# Patient Record
Sex: Female | Born: 1935
Health system: Southern US, Community
[De-identification: ages and names within clinical notes are randomized; demographics above are authoritative.]

## PROBLEM LIST (undated history)

## (undated) DIAGNOSIS — E039 Hypothyroidism, unspecified: Secondary | ICD-10-CM

## (undated) DIAGNOSIS — R413 Other amnesia: Secondary | ICD-10-CM

## (undated) DIAGNOSIS — I10 Essential (primary) hypertension: Secondary | ICD-10-CM

## (undated) DIAGNOSIS — H8109 Meniere's disease, unspecified ear: Secondary | ICD-10-CM

## (undated) DIAGNOSIS — E785 Hyperlipidemia, unspecified: Secondary | ICD-10-CM

## (undated) DIAGNOSIS — Z932 Ileostomy status: Secondary | ICD-10-CM

## (undated) DIAGNOSIS — K5190 Ulcerative colitis, unspecified, without complications: Secondary | ICD-10-CM

## (undated) DIAGNOSIS — I4819 Other persistent atrial fibrillation: Secondary | ICD-10-CM

## (undated) HISTORY — PX: COLECTOMY: SHX59

## (undated) HISTORY — DX: Meniere's disease, unspecified ear: H81.09

## (undated) HISTORY — PX: ILEOSTOMY: SHX1783

## (undated) HISTORY — DX: Essential (primary) hypertension: I10

## (undated) HISTORY — DX: Hyperlipidemia, unspecified: E78.5

## (undated) HISTORY — PX: OTHER SURGICAL HISTORY: SHX169

## (undated) HISTORY — PX: COLONOSCOPY: SHX174

## (undated) HISTORY — DX: Ulcerative colitis, unspecified, without complications: K51.90

## (undated) HISTORY — PX: PROCTECTOMY: SHX315

## (undated) HISTORY — DX: Ileostomy status: Z93.2

---

## 1994-08-15 DIAGNOSIS — H8109 Meniere's disease, unspecified ear: Secondary | ICD-10-CM

## 1994-08-15 HISTORY — DX: Meniere's disease, unspecified ear: H81.09

## 1998-05-19 ENCOUNTER — Inpatient Hospital Stay (HOSPITAL_COMMUNITY): Admission: EM | Admit: 1998-05-19 | Discharge: 1998-05-23 | Payer: Self-pay | Admitting: Gastroenterology

## 1998-05-19 ENCOUNTER — Encounter: Payer: Self-pay | Admitting: Gastroenterology

## 1998-05-20 ENCOUNTER — Encounter: Payer: Self-pay | Admitting: Gastroenterology

## 1998-06-08 LAB — HM COLONOSCOPY

## 1998-07-21 ENCOUNTER — Inpatient Hospital Stay (HOSPITAL_COMMUNITY): Admission: RE | Admit: 1998-07-21 | Discharge: 1998-07-30 | Payer: Self-pay | Admitting: Surgery

## 1998-12-10 ENCOUNTER — Other Ambulatory Visit: Admission: RE | Admit: 1998-12-10 | Discharge: 1998-12-10 | Payer: Self-pay | Admitting: Surgery

## 1999-04-13 ENCOUNTER — Other Ambulatory Visit: Admission: RE | Admit: 1999-04-13 | Discharge: 1999-04-13 | Payer: Self-pay | Admitting: Internal Medicine

## 1999-05-20 ENCOUNTER — Ambulatory Visit (HOSPITAL_COMMUNITY): Admission: RE | Admit: 1999-05-20 | Discharge: 1999-05-20 | Payer: Self-pay | Admitting: Endocrinology

## 1999-05-20 ENCOUNTER — Encounter: Payer: Self-pay | Admitting: Endocrinology

## 2000-06-06 ENCOUNTER — Other Ambulatory Visit: Admission: RE | Admit: 2000-06-06 | Discharge: 2000-06-06 | Payer: Self-pay | Admitting: Internal Medicine

## 2000-06-14 ENCOUNTER — Encounter: Admission: RE | Admit: 2000-06-14 | Discharge: 2000-06-14 | Payer: Self-pay | Admitting: Internal Medicine

## 2000-06-14 ENCOUNTER — Encounter: Payer: Self-pay | Admitting: Internal Medicine

## 2001-01-11 ENCOUNTER — Ambulatory Visit (HOSPITAL_COMMUNITY): Admission: RE | Admit: 2001-01-11 | Discharge: 2001-01-11 | Payer: Self-pay | Admitting: Endocrinology

## 2001-01-11 ENCOUNTER — Encounter: Payer: Self-pay | Admitting: Endocrinology

## 2001-01-17 ENCOUNTER — Ambulatory Visit (HOSPITAL_COMMUNITY): Admission: RE | Admit: 2001-01-17 | Discharge: 2001-01-17 | Payer: Self-pay | Admitting: Endocrinology

## 2001-01-17 ENCOUNTER — Encounter: Payer: Self-pay | Admitting: Endocrinology

## 2001-11-06 ENCOUNTER — Encounter: Admission: RE | Admit: 2001-11-06 | Discharge: 2001-11-06 | Payer: Self-pay | Admitting: Internal Medicine

## 2001-11-06 ENCOUNTER — Encounter: Payer: Self-pay | Admitting: Internal Medicine

## 2001-12-26 ENCOUNTER — Other Ambulatory Visit: Admission: RE | Admit: 2001-12-26 | Discharge: 2001-12-26 | Payer: Self-pay | Admitting: Internal Medicine

## 2003-06-04 ENCOUNTER — Encounter: Payer: Self-pay | Admitting: Internal Medicine

## 2003-06-04 ENCOUNTER — Encounter: Admission: RE | Admit: 2003-06-04 | Discharge: 2003-06-04 | Payer: Self-pay | Admitting: Internal Medicine

## 2004-06-08 ENCOUNTER — Encounter: Admission: RE | Admit: 2004-06-08 | Discharge: 2004-06-08 | Payer: Self-pay | Admitting: Internal Medicine

## 2004-09-21 ENCOUNTER — Ambulatory Visit: Payer: Self-pay | Admitting: Internal Medicine

## 2004-09-28 ENCOUNTER — Ambulatory Visit: Payer: Self-pay | Admitting: Internal Medicine

## 2005-03-22 ENCOUNTER — Ambulatory Visit: Payer: Self-pay | Admitting: Internal Medicine

## 2005-04-06 ENCOUNTER — Ambulatory Visit: Payer: Self-pay | Admitting: Internal Medicine

## 2005-04-13 ENCOUNTER — Ambulatory Visit: Payer: Self-pay | Admitting: Internal Medicine

## 2005-06-14 ENCOUNTER — Encounter: Admission: RE | Admit: 2005-06-14 | Discharge: 2005-06-14 | Payer: Self-pay | Admitting: Internal Medicine

## 2005-10-25 ENCOUNTER — Ambulatory Visit: Payer: Self-pay | Admitting: Internal Medicine

## 2005-11-10 ENCOUNTER — Ambulatory Visit: Payer: Self-pay | Admitting: Internal Medicine

## 2006-02-22 ENCOUNTER — Ambulatory Visit: Payer: Self-pay | Admitting: Internal Medicine

## 2006-05-10 ENCOUNTER — Ambulatory Visit: Payer: Self-pay | Admitting: Internal Medicine

## 2006-05-17 ENCOUNTER — Ambulatory Visit: Payer: Self-pay | Admitting: Internal Medicine

## 2006-06-06 ENCOUNTER — Ambulatory Visit: Payer: Self-pay | Admitting: Internal Medicine

## 2006-06-19 ENCOUNTER — Ambulatory Visit: Payer: Self-pay | Admitting: Internal Medicine

## 2006-07-11 ENCOUNTER — Encounter: Admission: RE | Admit: 2006-07-11 | Discharge: 2006-07-11 | Payer: Self-pay | Admitting: Internal Medicine

## 2006-11-14 ENCOUNTER — Ambulatory Visit: Payer: Self-pay | Admitting: Internal Medicine

## 2006-11-14 LAB — CONVERTED CEMR LAB
ALT: 20 units/L (ref 0–40)
AST: 30 units/L (ref 0–37)
Basophils Absolute: 0 10*3/uL (ref 0.0–0.1)
Basophils Relative: 0.3 % (ref 0.0–1.0)
Eosinophils Absolute: 0.1 10*3/uL (ref 0.0–0.6)
Eosinophils Relative: 3 % (ref 0.0–5.0)
Glucose, Bld: 96 mg/dL (ref 70–99)
HCT: 40.3 % (ref 36.0–46.0)
Hemoglobin: 13.5 g/dL (ref 12.0–15.0)
Hgb A1c MFr Bld: 6.1 % — ABNORMAL HIGH (ref 4.6–6.0)
Lymphocytes Relative: 23.5 % (ref 12.0–46.0)
MCHC: 33.4 g/dL (ref 30.0–36.0)
MCV: 87.5 fL (ref 78.0–100.0)
Monocytes Absolute: 0.6 10*3/uL (ref 0.2–0.7)
Monocytes Relative: 13.2 % — ABNORMAL HIGH (ref 3.0–11.0)
Neutro Abs: 2.9 10*3/uL (ref 1.4–7.7)
Neutrophils Relative %: 60 % (ref 43.0–77.0)
Platelets: 286 10*3/uL (ref 150–400)
RBC: 4.61 M/uL (ref 3.87–5.11)
RDW: 13.5 % (ref 11.5–14.6)
TSH: 1.67 microintl units/mL (ref 0.35–5.50)
WBC: 4.7 10*3/uL (ref 4.5–10.5)

## 2006-11-21 ENCOUNTER — Ambulatory Visit: Payer: Self-pay | Admitting: Internal Medicine

## 2007-03-19 ENCOUNTER — Encounter: Payer: Self-pay | Admitting: Internal Medicine

## 2007-03-19 DIAGNOSIS — M810 Age-related osteoporosis without current pathological fracture: Secondary | ICD-10-CM | POA: Insufficient documentation

## 2007-03-19 DIAGNOSIS — E785 Hyperlipidemia, unspecified: Secondary | ICD-10-CM | POA: Insufficient documentation

## 2007-03-19 DIAGNOSIS — I10 Essential (primary) hypertension: Secondary | ICD-10-CM | POA: Insufficient documentation

## 2007-03-19 DIAGNOSIS — K5190 Ulcerative colitis, unspecified, without complications: Secondary | ICD-10-CM | POA: Insufficient documentation

## 2007-03-19 DIAGNOSIS — H8109 Meniere's disease, unspecified ear: Secondary | ICD-10-CM | POA: Insufficient documentation

## 2007-06-06 ENCOUNTER — Ambulatory Visit: Payer: Self-pay | Admitting: Internal Medicine

## 2007-06-06 LAB — CONVERTED CEMR LAB
ALT: 18 units/L (ref 0–35)
AST: 29 units/L (ref 0–37)
Albumin: 3.6 g/dL (ref 3.5–5.2)
Alkaline Phosphatase: 55 units/L (ref 39–117)
BUN: 15 mg/dL (ref 6–23)
Basophils Absolute: 0 10*3/uL (ref 0.0–0.1)
Basophils Relative: 0.4 % (ref 0.0–1.0)
Bilirubin, Direct: 0.1 mg/dL (ref 0.0–0.3)
CO2: 30 meq/L (ref 19–32)
Calcium: 9.5 mg/dL (ref 8.4–10.5)
Chloride: 105 meq/L (ref 96–112)
Cholesterol: 180 mg/dL (ref 0–200)
Creatinine, Ser: 0.8 mg/dL (ref 0.4–1.2)
Eosinophils Absolute: 0.1 10*3/uL (ref 0.0–0.6)
Eosinophils Relative: 1.9 % (ref 0.0–5.0)
GFR calc Af Amer: 91 mL/min
GFR calc non Af Amer: 75 mL/min
Glucose, Bld: 109 mg/dL — ABNORMAL HIGH (ref 70–99)
HCT: 39.8 % (ref 36.0–46.0)
HDL: 66.5 mg/dL (ref >39.0)
Hemoglobin: 13.5 g/dL (ref 12.0–15.0)
Hgb A1c MFr Bld: 6.2 % — ABNORMAL HIGH (ref 4.6–6.0)
LDL Cholesterol: 95 mg/dL (ref 0–99)
Lymphocytes Relative: 22.6 % (ref 12.0–46.0)
MCHC: 33.9 g/dL (ref 30.0–36.0)
MCV: 86.5 fL (ref 78.0–100.0)
Monocytes Absolute: 0.6 10*3/uL (ref 0.2–0.7)
Monocytes Relative: 12.1 % — ABNORMAL HIGH (ref 3.0–11.0)
Neutro Abs: 3.2 10*3/uL (ref 1.4–7.7)
Neutrophils Relative %: 63 % (ref 43.0–77.0)
Platelets: 284 10*3/uL (ref 150–400)
Potassium: 4.8 meq/L (ref 3.5–5.1)
RBC: 4.6 M/uL (ref 3.87–5.11)
RDW: 14.2 % (ref 11.5–14.6)
Sodium: 141 meq/L (ref 135–145)
TSH: 1.1 microintl units/mL (ref 0.35–5.50)
Total Bilirubin: 0.6 mg/dL (ref 0.3–1.2)
Total CHOL/HDL Ratio: 2.7
Total Protein: 6.5 g/dL (ref 6.0–8.3)
Triglycerides: 92 mg/dL (ref 0–149)
VLDL: 18 mg/dL (ref 0–40)
Vit D, 1,25-Dihydroxy: 38 (ref 30–89)
Vitamin B-12: 637 pg/mL (ref 211–911)
WBC: 5 10*3/uL (ref 4.5–10.5)

## 2007-06-12 ENCOUNTER — Encounter: Payer: Self-pay | Admitting: Internal Medicine

## 2007-06-12 ENCOUNTER — Ambulatory Visit: Payer: Self-pay | Admitting: Internal Medicine

## 2007-06-12 DIAGNOSIS — R21 Rash and other nonspecific skin eruption: Secondary | ICD-10-CM | POA: Insufficient documentation

## 2007-07-17 ENCOUNTER — Encounter: Admission: RE | Admit: 2007-07-17 | Discharge: 2007-07-17 | Payer: Self-pay | Admitting: Internal Medicine

## 2007-07-26 ENCOUNTER — Encounter: Payer: Self-pay | Admitting: Internal Medicine

## 2007-07-26 ENCOUNTER — Telehealth: Payer: Self-pay | Admitting: Internal Medicine

## 2007-09-26 ENCOUNTER — Ambulatory Visit: Payer: Self-pay | Admitting: Internal Medicine

## 2007-09-26 DIAGNOSIS — H6120 Impacted cerumen, unspecified ear: Secondary | ICD-10-CM | POA: Insufficient documentation

## 2007-12-17 ENCOUNTER — Telehealth: Payer: Self-pay | Admitting: Internal Medicine

## 2008-01-11 ENCOUNTER — Encounter: Payer: Self-pay | Admitting: Internal Medicine

## 2008-03-19 ENCOUNTER — Ambulatory Visit: Payer: Self-pay | Admitting: Internal Medicine

## 2008-03-19 LAB — CONVERTED CEMR LAB
ALT: 21 units/L (ref 0–35)
AST: 33 units/L (ref 0–37)
Albumin: 3.7 g/dL (ref 3.5–5.2)
Alkaline Phosphatase: 59 units/L (ref 39–117)
BUN: 10 mg/dL (ref 6–23)
Basophils Absolute: 0 10*3/uL (ref 0.0–0.1)
Basophils Relative: 0.2 % (ref 0.0–3.0)
Bilirubin Urine: NEGATIVE
Bilirubin, Direct: 0.1 mg/dL (ref 0.0–0.3)
CO2: 31 meq/L (ref 19–32)
Calcium: 9.5 mg/dL (ref 8.4–10.5)
Chloride: 103 meq/L (ref 96–112)
Cholesterol: 177 mg/dL (ref 0–200)
Creatinine, Ser: 0.8 mg/dL (ref 0.4–1.2)
Eosinophils Absolute: 0.1 10*3/uL (ref 0.0–0.7)
Eosinophils Relative: 1.8 % (ref 0.0–5.0)
GFR calc Af Amer: 91 mL/min
GFR calc non Af Amer: 75 mL/min
Glucose, Bld: 103 mg/dL — ABNORMAL HIGH (ref 70–99)
HCT: 40.4 % (ref 36.0–46.0)
HDL: 72.2 mg/dL (ref >39.0)
Hemoglobin, Urine: NEGATIVE
Hemoglobin: 13.8 g/dL (ref 12.0–15.0)
Ketones, ur: NEGATIVE mg/dL
LDL Cholesterol: 96 mg/dL (ref 0–99)
Leukocytes, UA: NEGATIVE
Lymphocytes Relative: 23.6 % (ref 12.0–46.0)
MCHC: 34.1 g/dL (ref 30.0–36.0)
MCV: 88.7 fL (ref 78.0–100.0)
Monocytes Absolute: 0.6 10*3/uL (ref 0.1–1.0)
Monocytes Relative: 12.9 % — ABNORMAL HIGH (ref 3.0–12.0)
Neutro Abs: 2.9 10*3/uL (ref 1.4–7.7)
Neutrophils Relative %: 61.5 % (ref 43.0–77.0)
Nitrite: NEGATIVE
Platelets: 242 10*3/uL (ref 150–400)
Potassium: 4.1 meq/L (ref 3.5–5.1)
RBC: 4.55 M/uL (ref 3.87–5.11)
RDW: 13.7 % (ref 11.5–14.6)
Sodium: 140 meq/L (ref 135–145)
Specific Gravity, Urine: 1.01 (ref 1.000–1.03)
TSH: 0.49 microintl units/mL (ref 0.35–5.50)
Total Bilirubin: 0.9 mg/dL (ref 0.3–1.2)
Total CHOL/HDL Ratio: 2.5
Total Protein, Urine: NEGATIVE mg/dL
Total Protein: 6.3 g/dL (ref 6.0–8.3)
Triglycerides: 43 mg/dL (ref 0–149)
Urine Glucose: NEGATIVE mg/dL
Urobilinogen, UA: 0.2 (ref 0.0–1.0)
VLDL: 9 mg/dL (ref 0–40)
Vitamin B-12: 641 pg/mL (ref 211–911)
WBC: 4.7 10*3/uL (ref 4.5–10.5)
pH: 7 (ref 5.0–8.0)

## 2008-03-25 ENCOUNTER — Ambulatory Visit: Payer: Self-pay | Admitting: Internal Medicine

## 2008-07-23 ENCOUNTER — Encounter: Admission: RE | Admit: 2008-07-23 | Discharge: 2008-07-23 | Payer: Self-pay | Admitting: Internal Medicine

## 2008-09-17 ENCOUNTER — Ambulatory Visit: Payer: Self-pay | Admitting: Internal Medicine

## 2008-09-17 LAB — CONVERTED CEMR LAB
BUN: 10 mg/dL (ref 6–23)
CO2: 32 meq/L (ref 19–32)
Calcium: 9.4 mg/dL (ref 8.4–10.5)
Chloride: 106 meq/L (ref 96–112)
Creatinine, Ser: 0.7 mg/dL (ref 0.4–1.2)
GFR calc Af Amer: 106 mL/min
GFR calc non Af Amer: 87 mL/min
Glucose, Bld: 93 mg/dL (ref 70–99)
Potassium: 4.5 meq/L (ref 3.5–5.1)
Sodium: 142 meq/L (ref 135–145)

## 2008-09-24 ENCOUNTER — Ambulatory Visit: Payer: Self-pay | Admitting: Internal Medicine

## 2009-01-26 ENCOUNTER — Encounter: Payer: Self-pay | Admitting: Internal Medicine

## 2009-04-07 ENCOUNTER — Ambulatory Visit: Payer: Self-pay | Admitting: Internal Medicine

## 2009-04-07 LAB — CONVERTED CEMR LAB
ALT: 20 units/L (ref 0–35)
AST: 29 units/L (ref 0–37)
Albumin: 3.7 g/dL (ref 3.5–5.2)
Alkaline Phosphatase: 54 units/L (ref 39–117)
BUN: 10 mg/dL (ref 6–23)
Basophils Absolute: 0 10*3/uL (ref 0.0–0.1)
Basophils Relative: 0.1 % (ref 0.0–3.0)
Bilirubin Urine: NEGATIVE
Bilirubin, Direct: 0.1 mg/dL (ref 0.0–0.3)
CO2: 30 meq/L (ref 19–32)
Calcium: 9.2 mg/dL (ref 8.4–10.5)
Chloride: 107 meq/L (ref 96–112)
Cholesterol: 178 mg/dL (ref 0–200)
Creatinine, Ser: 0.7 mg/dL (ref 0.4–1.2)
Eosinophils Absolute: 0.1 10*3/uL (ref 0.0–0.7)
Eosinophils Relative: 2.1 % (ref 0.0–5.0)
GFR calc non Af Amer: 87.21 mL/min (ref >60)
Glucose, Bld: 105 mg/dL — ABNORMAL HIGH (ref 70–99)
HCT: 40.6 % (ref 36.0–46.0)
HDL: 72.1 mg/dL (ref >39.00)
Hemoglobin, Urine: NEGATIVE
Hemoglobin: 13.8 g/dL (ref 12.0–15.0)
Ketones, ur: NEGATIVE mg/dL
LDL Cholesterol: 94 mg/dL (ref 0–99)
Leukocytes, UA: NEGATIVE
Lymphocytes Relative: 18.1 % (ref 12.0–46.0)
Lymphs Abs: 1.1 10*3/uL (ref 0.7–4.0)
MCHC: 33.8 g/dL (ref 30.0–36.0)
MCV: 93.1 fL (ref 78.0–100.0)
Monocytes Absolute: 0.6 10*3/uL (ref 0.1–1.0)
Monocytes Relative: 9.7 % (ref 3.0–12.0)
Neutro Abs: 4.2 10*3/uL (ref 1.4–7.7)
Neutrophils Relative %: 70 % (ref 43.0–77.0)
Nitrite: NEGATIVE
Platelets: 249 10*3/uL (ref 150.0–400.0)
Potassium: 4.4 meq/L (ref 3.5–5.1)
RBC: 4.37 M/uL (ref 3.87–5.11)
RDW: 14.4 % (ref 11.5–14.6)
Sodium: 141 meq/L (ref 135–145)
Specific Gravity, Urine: 1.015 (ref 1.000–1.030)
TSH: 0.93 microintl units/mL (ref 0.35–5.50)
Total Bilirubin: 0.8 mg/dL (ref 0.3–1.2)
Total CHOL/HDL Ratio: 2
Total Protein, Urine: NEGATIVE mg/dL
Total Protein: 6.3 g/dL (ref 6.0–8.3)
Triglycerides: 59 mg/dL (ref 0.0–149.0)
Urine Glucose: NEGATIVE mg/dL
Urobilinogen, UA: 0.2 (ref 0.0–1.0)
VLDL: 11.8 mg/dL (ref 0.0–40.0)
WBC: 6 10*3/uL (ref 4.5–10.5)
pH: 7.5 (ref 5.0–8.0)

## 2009-04-14 ENCOUNTER — Ambulatory Visit: Payer: Self-pay | Admitting: Internal Medicine

## 2009-04-14 DIAGNOSIS — D485 Neoplasm of uncertain behavior of skin: Secondary | ICD-10-CM | POA: Insufficient documentation

## 2009-05-06 ENCOUNTER — Encounter: Payer: Self-pay | Admitting: Internal Medicine

## 2009-05-06 ENCOUNTER — Ambulatory Visit: Payer: Self-pay | Admitting: Internal Medicine

## 2009-07-14 ENCOUNTER — Encounter: Payer: Self-pay | Admitting: Internal Medicine

## 2009-09-03 ENCOUNTER — Encounter: Admission: RE | Admit: 2009-09-03 | Discharge: 2009-09-03 | Payer: Self-pay | Admitting: Internal Medicine

## 2009-09-03 LAB — HM MAMMOGRAPHY: HM Mammogram: NEGATIVE

## 2009-10-07 ENCOUNTER — Ambulatory Visit: Payer: Self-pay | Admitting: Internal Medicine

## 2009-10-07 LAB — CONVERTED CEMR LAB
ALT: 24 units/L (ref 0–35)
AST: 32 units/L (ref 0–37)
Albumin: 3.2 g/dL — ABNORMAL LOW (ref 3.5–5.2)
Alkaline Phosphatase: 50 units/L (ref 39–117)
BUN: 8 mg/dL (ref 6–23)
Basophils Absolute: 0 10*3/uL (ref 0.0–0.1)
Basophils Relative: 0.1 % (ref 0.0–3.0)
Bilirubin, Direct: 0.1 mg/dL (ref 0.0–0.3)
CO2: 29 meq/L (ref 19–32)
Calcium: 8.9 mg/dL (ref 8.4–10.5)
Chloride: 106 meq/L (ref 96–112)
Cholesterol: 155 mg/dL (ref 0–200)
Creatinine, Ser: 0.6 mg/dL (ref 0.4–1.2)
Eosinophils Absolute: 0.1 10*3/uL (ref 0.0–0.7)
Eosinophils Relative: 1.6 % (ref 0.0–5.0)
GFR calc non Af Amer: 104.05 mL/min (ref >60)
Glucose, Bld: 86 mg/dL (ref 70–99)
HCT: 39.5 % (ref 36.0–46.0)
HDL: 69.3 mg/dL (ref >39.00)
Hemoglobin: 12.9 g/dL (ref 12.0–15.0)
LDL Cholesterol: 76 mg/dL (ref 0–99)
Lymphocytes Relative: 22.9 % (ref 12.0–46.0)
Lymphs Abs: 1.1 10*3/uL (ref 0.7–4.0)
MCHC: 32.6 g/dL (ref 30.0–36.0)
MCV: 90.9 fL (ref 78.0–100.0)
Monocytes Absolute: 0.7 10*3/uL (ref 0.1–1.0)
Monocytes Relative: 15.3 % — ABNORMAL HIGH (ref 3.0–12.0)
Neutro Abs: 2.8 10*3/uL (ref 1.4–7.7)
Neutrophils Relative %: 60.1 % (ref 43.0–77.0)
Platelets: 219 10*3/uL (ref 150.0–400.0)
Potassium: 4.1 meq/L (ref 3.5–5.1)
RBC: 4.34 M/uL (ref 3.87–5.11)
RDW: 14.2 % (ref 11.5–14.6)
Sodium: 140 meq/L (ref 135–145)
TSH: 2.56 microintl units/mL (ref 0.35–5.50)
Total Bilirubin: 0.2 mg/dL — ABNORMAL LOW (ref 0.3–1.2)
Total CHOL/HDL Ratio: 2
Total Protein: 5.9 g/dL — ABNORMAL LOW (ref 6.0–8.3)
Triglycerides: 50 mg/dL (ref 0.0–149.0)
VLDL: 10 mg/dL (ref 0.0–40.0)
Vitamin B-12: 399 pg/mL (ref 211–911)
WBC: 4.7 10*3/uL (ref 4.5–10.5)

## 2009-10-14 ENCOUNTER — Ambulatory Visit: Payer: Self-pay | Admitting: Internal Medicine

## 2009-12-01 ENCOUNTER — Ambulatory Visit: Payer: Self-pay | Admitting: Internal Medicine

## 2010-01-12 ENCOUNTER — Encounter: Payer: Self-pay | Admitting: Internal Medicine

## 2010-01-20 ENCOUNTER — Ambulatory Visit: Payer: Self-pay | Admitting: Internal Medicine

## 2010-04-28 ENCOUNTER — Ambulatory Visit: Payer: Self-pay | Admitting: Internal Medicine

## 2010-04-28 LAB — CONVERTED CEMR LAB
ALT: 21 units/L (ref 0–35)
AST: 33 units/L (ref 0–37)
Albumin: 3.8 g/dL (ref 3.5–5.2)
Alkaline Phosphatase: 59 units/L (ref 39–117)
BUN: 16 mg/dL (ref 6–23)
Basophils Absolute: 0 10*3/uL (ref 0.0–0.1)
Basophils Relative: 0.6 % (ref 0.0–3.0)
Bilirubin, Direct: 0.2 mg/dL (ref 0.0–0.3)
CO2: 31 meq/L (ref 19–32)
Calcium: 9.3 mg/dL (ref 8.4–10.5)
Chloride: 104 meq/L (ref 96–112)
Creatinine, Ser: 0.7 mg/dL (ref 0.4–1.2)
Eosinophils Absolute: 0.1 10*3/uL (ref 0.0–0.7)
Eosinophils Relative: 2.7 % (ref 0.0–5.0)
GFR calc non Af Amer: 84.18 mL/min (ref >60)
Glucose, Bld: 77 mg/dL (ref 70–99)
HCT: 42.5 % (ref 36.0–46.0)
Hemoglobin: 14.6 g/dL (ref 12.0–15.0)
Lymphocytes Relative: 25.1 % (ref 12.0–46.0)
Lymphs Abs: 1.4 10*3/uL (ref 0.7–4.0)
MCHC: 34.3 g/dL (ref 30.0–36.0)
MCV: 92 fL (ref 78.0–100.0)
Monocytes Absolute: 0.6 10*3/uL (ref 0.1–1.0)
Monocytes Relative: 10.8 % (ref 3.0–12.0)
Neutro Abs: 3.4 10*3/uL (ref 1.4–7.7)
Neutrophils Relative %: 60.8 % (ref 43.0–77.0)
Platelets: 233 10*3/uL (ref 150.0–400.0)
Potassium: 4.4 meq/L (ref 3.5–5.1)
RBC: 4.63 M/uL (ref 3.87–5.11)
RDW: 14.5 % (ref 11.5–14.6)
Sodium: 141 meq/L (ref 135–145)
TSH: 1.03 microintl units/mL (ref 0.35–5.50)
Total Bilirubin: 0.7 mg/dL (ref 0.3–1.2)
Total Protein: 6.2 g/dL (ref 6.0–8.3)
WBC: 5.5 10*3/uL (ref 4.5–10.5)

## 2010-05-04 ENCOUNTER — Ambulatory Visit: Payer: Self-pay | Admitting: Internal Medicine

## 2010-05-04 DIAGNOSIS — L570 Actinic keratosis: Secondary | ICD-10-CM | POA: Insufficient documentation

## 2010-07-15 ENCOUNTER — Encounter: Payer: Self-pay | Admitting: Internal Medicine

## 2010-09-09 ENCOUNTER — Other Ambulatory Visit: Payer: Self-pay | Admitting: Internal Medicine

## 2010-09-09 DIAGNOSIS — Z1239 Encounter for other screening for malignant neoplasm of breast: Secondary | ICD-10-CM

## 2010-09-14 NOTE — Letter (Signed)
Summary: Otolaryngology/[facility]  Otolaryngology/[facility]   Imported By: Sherian Rein 02/17/2010 11:16:28  _____________________________________________________________________  External Attachment:    Type:   Image     Comment:   External Document

## 2010-09-14 NOTE — Assessment & Plan Note (Signed)
Summary: 6 MOS F/U # CD   Vital Signs:  Patient profile:   77 year old female Height:      67 inches Weight:      147 pounds BMI:     23.11 Temp:     97.6 degrees F oral Pulse rate:   76 / minute Pulse rhythm:   regular Resp:     16 per minute BP sitting:   152 / 84  (left arm) Cuff size:   regular  Vitals Entered By: Lanier Prude, CMA(AAMA) (May 04, 2010 10:59 AM) CC: 6 mo f/u Is Patient Diabetic? No Comments pt is not taking Lipo Flavonoid or Vit D.     CC:  6 mo f/u.  History of Present Illness: C/o AK on R nose F/u HTN, colitis  Preventive Screening-Counseling & Management  Alcohol-Tobacco     Smoking Status: never  Allergies: No Known Drug Allergies  Past History:  Past Medical History: Last updated: 04/14/2009 Hypertension (BP nl at home) Osteoporosis Ileostomy Hyperlipidemia H/o ulcerative colitis Menier's 1996  Family History: Last updated: 06/12/2007 Family History of CAD Female 1st degree relative <60 Family History of CAD Female 1st degree relative <50 Family History Hypertension  Social History: Last updated: 06/12/2007 Retired Married Never Smoked Alcohol use-no  Review of Systems  The patient denies weight loss, chest pain, prolonged cough, and abdominal pain.    Physical Exam  General:  Well-developed,well-nourished,in no acute distress; alert,appropriate and cooperative throughout examination Nose:  see skin exam Mouth:  WNL Lungs:  Normal respiratory effort, chest expands symmetrically. Lungs are clear to auscultation, no crackles or wheezes. Heart:  Normal rate and regular rhythm. S1 and S2 normal without gallop, murmur, click, rub or other extra sounds. Abdomen:  Bowel sounds positive,abdomen soft and non-tender . Stoma w/bag present. Msk:  No deformity or scoliosis noted of thoracic or lumbar spine.   Neurologic:  No cranial nerve deficits noted. Station and gait are normal. Plantar reflexes are down-going bilaterally.  DTRs are symmetrical throughout. Sensory, motor and coordinative functions appear intact. Skin:  2x2 mm R nose ala growth L nose lesion has disappeared - no scar Psych:  Cognition and judgment appear intact. Alert and cooperative with normal attention span and concentration. No apparent delusions, illusions, hallucinations   Impression & Recommendations:  Problem # 1:  ULCERATIVE COLITIS (ICD-556.9) Assessment Comment Only  Problem # 2:  ACTINIC KERATOSIS (ICD-702.0) R nose Assessment: New Procedure: cryo Indication: AK(s) Risks incl. scar(s), incomplete removal, ect.  and benefits discussed   1   lesion(s) on R nose was/were treated with liqid N2 in usual fasion.  Tolerated well. Compl. none. Wound care instructions given. Will biopsy if reoccurs.  Problem # 3:  HYPERLIPIDEMIA (ICD-272.4) Assessment: Unchanged  Her updated medication list for this problem includes:    Vytorin 10-20 Mg Tabs (Ezetimibe-simvastatin) ..... Once daily  Problem # 4:  HYPERTENSION (ICD-401.9) Assessment: Unchanged  Her updated medication list for this problem includes:    Aceon 8 Mg Tabs (Perindopril erbumine) .[name]... 1 once daily    Verapamil Hcl 80 Mg Tabs (Verapamil hcl) .[name]... 1 by mouth bid  BP today: 152/84 Prior BP: 144/72 (01/20/2010)  Labs Reviewed: K+: 4.4 (04/28/2010) Creat: : 0.7 (04/28/2010)   Chol: 155 (10/07/2009)   HDL: 69.30 (10/07/2009)   LDL: 76 (10/07/2009)   TG: 50.0 (10/07/2009)  Complete Medication List: 1)  Synthroid 75 Mcg Tabs (Levothyroxine sodium) .[name].. 1 once daily 2)  Vytorin 10-20 Mg Tabs (Ezetimibe-simvastatin) .... Once  daily 3)  Aceon 8 Mg Tabs (Perindopril erbumine) .[name].. 1 once daily 4)  Verapamil Hcl 80 Mg Tabs (Verapamil hcl) .[name].. 1 by mouth bid 5)  Amitriptyline Hcl 25 Mg Tabs (Amitriptyline hcl) .[name].. 1 by mouth qd 6)  Lipo-flavonoid Plus Tabs (Vitamins-lipotropics) .... Once daily 7)  Vitamin D3 1000 Unit Tabs (Cholecalciferol) .[name].. 1 qd 8)  Caltrate 600+d  600-400 Mg-unit Tabs (Calcium carbonate-vitamin d) .... Two times a day 9)  Centrum Silver Tabs (Multiple vitamins-minerals) .... Once daily 10)  Triamcinolone Acetonide 0.5 % Crea (Triamcinolone acetonide) .... Use two times a day prn  Other Orders: Flu Vaccine 1yrs + MEDICARE PATIENTS (Z6109) Administration Flu vaccine - MCR (G0008) Cryotherapy/Destruction benign or premalignant lesion (1st lesion)  (17000)  Patient Instructions: 1)  Please schedule a follow-up appointment in 6 months well w/labs.  Marland Kitchenlbmedflu   Flu Vaccine Consent Questions     Do you have a history of severe allergic reactions to this vaccine? no    Any prior history of allergic reactions to egg and/or gelatin? no    Do you have a sensitivity to the preservative Thimersol? no    Do you have a past history of Guillan-Barre Syndrome? no    Do you currently have an acute febrile illness? no    Have you ever had a severe reaction to latex? no    Vaccine information given and explained to patient? yes    Are you currently pregnant? no    Lot Number:[id number]   Exp Date:02/12/2011   Site Given  Right Deltoid IM Lanier Prude, [facility])  May 04, 2010 11:37 AM

## 2010-09-14 NOTE — Assessment & Plan Note (Signed)
Summary: 6 mos f/u $50 /cd   Vital Signs:  Patient profile:   77 year old female Weight:      145 pounds Temp:     98.5 degrees F oral Pulse rate:   82 / minute BP sitting:   164 / 86  (left arm)  Vitals Entered By: Doralee Albino (October 14, 2009 10:50 AM) CC: f/u Is Patient Diabetic? No   CC:  f/u.  History of Present Illness: The patient presents for a follow up of HTN, hypothyroidism, elev. chol. No headaches. C/o ringing in R ear - seeing ENT at [facility] - better  Preventive Screening-Counseling & Management  Alcohol-Tobacco     Smoking Status: never  Current Medications (verified): 1)  Synthroid 75 Mcg  Tabs (Levothyroxine Sodium) .... Once Daily 2)  Vytorin 10-20 Mg  Tabs (Ezetimibe-Simvastatin) .... Once Daily 3)  Evista 60 Mg  Tabs (Raloxifene Hcl) .... Once Daily 4)  Aceon 8 Mg  Tabs (Perindopril Erbumine) .[name].. 1 Once Daily 5)  Vitamin D3 1000 Unit  Tabs (Cholecalciferol) .[name].. 1 Qd 6)  Verapamil Hcl 80 Mg  Tabs (Verapamil Hcl) .[name].. 1 By Mouth Bid 7)  Cortisporin 5-10000-1 Susp (Neomycin-Polymyxin-Hc) .... 3 Gtt in Each Ear Tid 8)  Caltrate 600+d 600-400 Mg-Unit Tabs (Calcium Carbonate-Vitamin D) .... Two Times A Day 9)  Centrum Silver  Tabs (Multiple Vitamins-Minerals) .... Once Daily 10)  Amitriptyline Hcl 25 Mg Tabs (Amitriptyline Hcl) .... Two Times A Day 11)  Lipo-Flavonoid Plus  Tabs (Vitamins-Lipotropics) .... Once Daily  Allergies (verified): No Known Drug Allergies  Past History:  Past Medical History: Last updated: 04/14/2009 Hypertension (BP nl at home) Osteoporosis Ileostomy Hyperlipidemia H/o ulcerative colitis Menier's 1996  Past Surgical History: Last updated: 04/14/2009 Colectomy Proctectomy Ileostomy Ear shunt for Menier's  Social History: Last updated: 06/12/2007 Retired Married Never Smoked Alcohol use-no  Review of Systems  The patient denies weight gain, vision loss, chest pain, and dyspnea on exertion.    Physical  Exam  General:  Well-developed,well-nourished,in no acute distress; alert,appropriate and cooperative throughout examination Eyes:  No corneal or conjunctival inflammation noted. EOMI. Perrla. rmal. Ears:  WNL Nose:  see skin exam Mouth:  WNL Lungs:  Normal respiratory effort, chest expands symmetrically. Lungs are clear to auscultation, no crackles or wheezes. Heart:  Normal rate and regular rhythm. S1 and S2 normal without gallop, murmur, click, rub or other extra sounds. Abdomen:  Bowel sounds positive,abdomen soft and non-tender . Stoma w/bag present. Msk:  No deformity or scoliosis noted of thoracic or lumbar spine.   Neurologic:  No cranial nerve deficits noted. Station and gait are normal. Plantar reflexes are down-going bilaterally. DTRs are symmetrical throughout. Sensory, motor and coordinative functions appear intact. Skin:  5-6 mm growth on nose Psych:  Cognition and judgment appear intact. Alert and cooperative with normal attention span and concentration. No apparent delusions, illusions, hallucinations   Impression & Recommendations:  Problem # 1:  HYPERTENSION (ICD-401.9) Assessment Unchanged  Her updated medication list for this problem includes:    Aceon 8 Mg Tabs (Perindopril erbumine) .[name]... 1 once daily    Verapamil Hcl 80 Mg Tabs (Verapamil hcl) .[name]... 1 by mouth bid BP OK at home  Problem # 2:  MENIERE'S DISEASE (ICD-386.00) Assessment: Deteriorated On prescription therapy w/[facility] Orders: Prescription Created Electronically [id number])  Problem # 3:  HYPERLIPIDEMIA (GBE-010.4) Assessment: Unchanged  Her updated medication list for this problem includes:    Vytorin 10-20 Mg Tabs (Ezetimibe-simvastatin) ..... Once daily  Problem # 4:  OSTEOPOROSIS (ICD-733.00) Assessment: Unchanged  The following medications were removed from the medication list:    Evista 60 Mg Tabs (Raloxifene hcl) ..... Once daily she wants to d/c due to cost  Problem # 5:  ULCERATIVE  COLITIS (ICD-556.9)  Problem # 6:  NEOPLASM, SKIN, UNCERTAIN BEHAVIOR (HAL-937.9) nose Assessment: New skin bx  Complete Medication List: 1)  Synthroid 75 Mcg Tabs (Levothyroxine sodium) .[name].. 1 once daily 2)  Vytorin 10-20 Mg Tabs (Ezetimibe-simvastatin) .... Once daily 3)  Aceon 8 Mg Tabs (Perindopril erbumine) .[name].. 1 once daily 4)  Verapamil Hcl 80 Mg Tabs (Verapamil hcl) .[name].. 1 by mouth bid 5)  Cortisporin 5-10000-1 Susp (Neomycin-polymyxin-hc) .... 3 gtt in each ear tid 6)  Amitriptyline Hcl 25 Mg Tabs (Amitriptyline hcl) .... Two times a day 7)  Lipo-flavonoid Plus Tabs (Vitamins-lipotropics) .... Once daily 8)  Vitamin D3 1000 Unit Tabs (Cholecalciferol) .[name].. 1 qd 9)  Caltrate 600+d 600-400 Mg-unit Tabs (Calcium carbonate-vitamin d) .... Two times a day 10)  Centrum Silver Tabs (Multiple vitamins-minerals) .... Once daily  Patient Instructions: 1)  Please schedule a follow-up appointment in 6 months. 2)  Eat more protein! 3)  BMP prior to visit, ICD-9: 4)  Hepatic Panel prior to visit, ICD-9: 995.20  272.0 5)  TSH prior to visit, ICD-9: 6)  CBC w/ Diff prior to visit, ICD-9: 7)  Skin biopsy w/me Prescriptions: SYNTHROID 75 MCG  TABS (LEVOTHYROXINE SODIUM) 1 once daily Brand medically necessary #90 x 3   Entered and Authorized by:   Cassandria Anger MD   Signed by:   Cassandria Anger MD on 10/14/2009   Method used:   Print then Give to Patient   RxID:   [id number] VERAPAMIL HCL 80 MG  TABS (VERAPAMIL HCL) 1 by mouth bid  #180 x 3   Entered and Authorized by:   Cassandria Anger MD   Signed by:   Cassandria Anger MD on 10/14/2009   Method used:   Print then Give to Patient   RxID:   [id number] ACEON 8 MG  TABS (PERINDOPRIL ERBUMINE) 1 once daily  #90 x 3   Entered and Authorized by:   Cassandria Anger MD   Signed by:   Cassandria Anger MD on 10/14/2009   Method used:   Print then Give to Patient   RxID:   [id number] VYTORIN 10-20 MG   TABS (EZETIMIBE-SIMVASTATIN) once daily  #90 x 3   Entered and Authorized by:   Cassandria Anger MD   Signed by:   Cassandria Anger MD on 10/14/2009   Method used:   Print then Give to Patient   RxID:   [id number] SYNTHROID 75 MCG  TABS (LEVOTHYROXINE SODIUM) once daily  #90 x 3   Entered and Authorized by:   Cassandria Anger MD   Signed by:   Cassandria Anger MD on 10/14/2009   Method used:   Print then Give to Patient   RxID:   [id number] VERAPAMIL HCL 80 MG  TABS (VERAPAMIL HCL) 1 by mouth bid  #180 x 3   Entered and Authorized by:   Cassandria Anger MD   Signed by:   Cassandria Anger MD on 10/14/2009   Method used:   Electronically to        [facility] [phone]* (retail)       [place]  [place], [place]  [id number]       Ph: [phone] or [phone]       Fax: [phone]   RxID:   [id number] VYTORIN 10-20 MG  TABS (EZETIMIBE-SIMVASTATIN) once daily  #90 x 3   Entered and Authorized by:   Cassandria Anger MD   Signed by:   Cassandria Anger MD on 10/14/2009   Method used:   Electronically to        [facility] [phone]* (retail)       [place], [place]  [id number]       Ph: [phone] or [phone]       Fax: [phone]   RxID:   [id number] ACEON 8 MG  TABS (PERINDOPRIL ERBUMINE) 1 once daily  #90 Tablet x 3   Entered and Authorized by:   Cassandria Anger MD   Signed by:   Cassandria Anger MD on 10/14/2009   Method used:   Electronically to        [facility] [phone]* (retail)       [place], [place]  [id number]       Ph: [phone] or [phone]       Fax: [phone]   RxID:   [id number] SYNTHROID 75 MCG  TABS (LEVOTHYROXINE SODIUM) once daily  #90 x 3   Entered and Authorized by:   Cassandria Anger MD   Signed by:   Cassandria Anger MD on  10/14/2009   Method used:   Electronically to        [facility] [phone]* (retail)       [place], [place]  [id number]       Ph: [phone] or [phone]       Fax: [phone]   RxID:   [id number]

## 2010-09-14 NOTE — Assessment & Plan Note (Signed)
Summary: f/u to skin biopsy/#/cd   Vital Signs:  Patient profile:   77 year old female Height:      67 inches Weight:      150.13 pounds BMI:     23.60 O2 Sat:      95 % on Room air Temp:     97.3 degrees F oral Pulse rate:   80 / minute BP sitting:   144 / 72  (left arm) Cuff size:   regular  Vitals Entered By: Lucious Groves (January 20, 2010 1:30 PM)  O2 Flow:  Room air CC: Follow-up visit to skin bx./kb Is Patient Diabetic? No Pain Assessment Patient in pain? no        CC:  Follow-up visit to skin bx./kb.  History of Present Illness: F/u L nose lesion post-bx. C/o R nose red spot  x 2 wks F/u HTN  Current Medications (verified): 1)  Synthroid 75 Mcg  Tabs (Levothyroxine Sodium) .[name].. 1 Once Daily 2)  Vytorin 10-20 Mg  Tabs (Ezetimibe-Simvastatin) .... Once Daily 3)  Aceon 8 Mg  Tabs (Perindopril Erbumine) .[name].. 1 Once Daily 4)  Verapamil Hcl 80 Mg  Tabs (Verapamil Hcl) .[name].. 1 By Mouth Bid 5)  Amitriptyline Hcl 25 Mg Tabs (Amitriptyline Hcl) .[name].. 1 By Mouth Qd 6)  Lipo-Flavonoid Plus  Tabs (Vitamins-Lipotropics) .... Once Daily 7)  Vitamin D3 1000 Unit  Tabs (Cholecalciferol) .[name].. 1 Qd 8)  Caltrate 600+d 600-400 Mg-Unit Tabs (Calcium Carbonate-Vitamin D) .... Two Times A Day 9)  Centrum Silver  Tabs (Multiple Vitamins-Minerals) .... Once Daily  Allergies (verified): No Known Drug Allergies  Physical Exam  General:  Well-developed,well-nourished,in no acute distress; alert,appropriate and cooperative throughout examination Nose:  see skin exam Mouth:  WNL Skin:  2x2 mm R nose ala growth L nose lesion has disappeared - no scar Psych:  Cognition and judgment appear intact. Alert and cooperative with normal attention span and concentration. No apparent delusions, illusions, hallucinations   Impression & Recommendations:  Problem # 1:  NEOPLASM, SKIN, UNCERTAIN BEHAVIOR (ICD-238.2) L nose - resolved Assessment Comment Only The path. lab were reviewed with the  patient.   Problem # 2:  NEOPLASM, SKIN, UNCERTAIN BEHAVIOR (ICD-238.2) R nose x wks Assessment: New Cryo if not gone Triamc two times a day cream  Problem # 3:  HYPERTENSION (ICD-401.9) Assessment: Unchanged  Her updated medication list for this problem includes:    Aceon 8 Mg Tabs (Perindopril erbumine) .[name]... 1 once daily    Verapamil Hcl 80 Mg Tabs (Verapamil hcl) .[name]... 1 by mouth bid BP ok at home BP today: 144/72 Prior BP: 140/76 (12/01/2009)  Labs Reviewed: K+: 4.1 (10/07/2009) Creat: : 0.6 (10/07/2009)   Chol: 155 (10/07/2009)   HDL: 69.30 (10/07/2009)   LDL: 76 (10/07/2009)   TG: 50.0 (10/07/2009)  Complete Medication List: 1)  Synthroid 75 Mcg Tabs (Levothyroxine sodium) .[name].. 1 once daily 2)  Vytorin 10-20 Mg Tabs (Ezetimibe-simvastatin) .... Once daily 3)  Aceon 8 Mg Tabs (Perindopril erbumine) .[name].. 1 once daily 4)  Verapamil Hcl 80 Mg Tabs (Verapamil hcl) .[name].. 1 by mouth bid 5)  Amitriptyline Hcl 25 Mg Tabs (Amitriptyline hcl) .[name].. 1 by mouth qd 6)  Lipo-flavonoid Plus Tabs (Vitamins-lipotropics) .... Once daily 7)  Vitamin D3 1000 Unit Tabs (Cholecalciferol) .[name].. 1 qd 8)  Caltrate 600+d 600-400 Mg-unit Tabs (Calcium carbonate-vitamin d) .... Two times a day 9)  Centrum Silver Tabs (Multiple vitamins-minerals) .... Once daily 10)  Triamcinolone Acetonide 0.5 % Crea (  Triamcinolone acetonide) .... Use two times a day prn  Patient Instructions: 1)  Please schedule a follow-up appointment in 6 months well w/labs. Prescriptions: TRIAMCINOLONE ACETONIDE 0.5 % CREA (TRIAMCINOLONE ACETONIDE) use two times a day prn  #30 g x 3   Entered and Authorized by:   Tresa Garter MD   Signed by:   Tresa Garter MD on 01/20/2010   Method used:   [facility]ron[facility] to        [facility]  [address] [phone]* (retail)       [address]       [place], [place]  [id number]       Ph: [phone] or [phone]       Fax: [phone]   RxID:    [id number]

## 2010-09-14 NOTE — Assessment & Plan Note (Signed)
Summary: SKIN BX / #/ CD   Vital Signs:  Patient profile:   77 year old female Height:      67 inches Weight:      147.75 pounds BMI:     23.22 O2 Sat:      97 % on Room air Temp:     98.0 degrees F oral Pulse rate:   77 / minute BP sitting:   140 / 76  (left arm) Cuff size:   regular  Vitals Entered By: Lucious Groves (December 01, 2009 10:51 AM)  O2 Flow:  Room air  Procedure Note  Biopsy: Biopsy Type: Skin The patient complains of redness, irritation, and inflammation. Onset of lesion: 12 mo  Indication: suspicious lesion Consent signed: yes  Procedure # 1: shave biopsy    Size (in cm): 0.4 x 0.6    Region: lateral    Location: nasal-left    Comment: Risks including but not limited by incomplete procedure, bleeding, infection, recurrence were discussed with the patient. Consent form was signed. Tolerated well. Complicatons - none. Good hemostasis following the procedure.     Instrument used: dermablade    Anesthesia: 1% lidocaine w/o epinephrine  Cleaned and prepped with: alcohol and betadine Wound dressing: neosporin Instructions: daily dressing changes  CC: Skin bx./kb Is Patient Diabetic? No Pain Assessment Patient in pain? no        CC:  Skin bx./kb.  History of Present Illness: Came for a skin bx for L nose ala growth  Current Medications (verified): 1)  Synthroid 75 Mcg  Tabs (Levothyroxine Sodium) .[name].. 1 Once Daily 2)  Vytorin 10-20 Mg  Tabs (Ezetimibe-Simvastatin) .... Once Daily 3)  Aceon 8 Mg  Tabs (Perindopril Erbumine) .[name].. 1 Once Daily 4)  Verapamil Hcl 80 Mg  Tabs (Verapamil Hcl) .[name].. 1 By Mouth Bid 5)  Amitriptyline Hcl 25 Mg Tabs (Amitriptyline Hcl) .[name].. 1 By Mouth Qd 6)  Lipo-Flavonoid Plus  Tabs (Vitamins-Lipotropics) .... Once Daily 7)  Vitamin D3 1000 Unit  Tabs (Cholecalciferol) .[name].. 1 Qd 8)  Caltrate 600+d 600-400 Mg-Unit Tabs (Calcium Carbonate-Vitamin D) .... Two Times A Day 9)  Centrum Silver  Tabs (Multiple Vitamins-Minerals) ....  Once Daily  Allergies (verified): No Known Drug Allergies  Physical Exam  Skin:  4x6 mm L nose ala growth   Impression & Recommendations:  Problem # 1:  NEOPLASM, SKIN, UNCERTAIN BEHAVIOR (ICD-238.2) Assessment New  [facility] ref if needed.  Orders: Shave Skin Lesion 0.6-1.0cm face/ears/eyelids/nose/lips/mm (11311)  Complete Medication List: 1)  Synthroid 75 Mcg Tabs (Levothyroxine sodium) .[name].. 1 once daily 2)  Vytorin 10-20 Mg Tabs (Ezetimibe-simvastatin) .... Once daily 3)  Aceon 8 Mg Tabs (Perindopril erbumine) .[name].. 1 once daily 4)  Verapamil Hcl 80 Mg Tabs (Verapamil hcl) .[name].. 1 by mouth bid 5)  Amitriptyline Hcl 25 Mg Tabs (Amitriptyline hcl) .[name].. 1 by mouth qd 6)  Lipo-flavonoid Plus Tabs (Vitamins-lipotropics) .... Once daily 7)  Vitamin D3 1000 Unit Tabs (Cholecalciferol) .[name].. 1 qd 8)  Caltrate 600+d 600-400 Mg-unit Tabs (Calcium carbonate-vitamin d) .... Two times a day 9)  Centrum Silver Tabs (Multiple vitamins-minerals) .... Once daily  Patient Instructions: 1)  !

## 2010-09-16 NOTE — Letter (Signed)
Summary: [facility]ology[facility]  [facility]ology/[facility]   Imported By: Phillis Knack 08/02/2010 12:32:56  _____________________________________________________________________  External Attachment:    Type:   Image     Comment:   External Document

## 2010-09-29 ENCOUNTER — Ambulatory Visit
Admission: RE | Admit: 2010-09-29 | Discharge: 2010-09-29 | Disposition: A | Discharge status: Home / Self Care | Payer: Medicare Other | Source: Ambulatory Visit | Attending: Internal Medicine | Admitting: Internal Medicine

## 2010-09-29 ENCOUNTER — Encounter: Payer: Self-pay | Admitting: Internal Medicine

## 2010-09-29 DIAGNOSIS — Z1239 Encounter for other screening for malignant neoplasm of breast: Secondary | ICD-10-CM

## 2010-10-29 ENCOUNTER — Telehealth: Payer: Self-pay | Admitting: Internal Medicine

## 2010-11-11 NOTE — Progress Notes (Signed)
Summary: Rf PERINDOPRIL ERBUMINE 8 MG   Phone Note From Pharmacy   [facility]   [address] [phone][facility] Summary of Call: rec Rf req for PERINDOPRIL ERBUMINE 8 MG TAB 1 by mouth once daily #90.  Med is not on active list...ok to Rf?? Initial call taken by: Lanier Prude, [facility]),  October 29, 2010 1:40 PM  Follow-up for Phone Call        OK - t is Aceon Thank you!  Follow-up by: Tresa Garter MD,  October 29, 2010 6:01 PM    Prescriptions: ACEON 8 MG  TABS (PERINDOPRIL ERBUMINE) 1 once daily  #90 x 3   Entered by:   Lamar Sprinkles, CMA   Authorized by:   Tresa Garter MD   Signed by:   Lamar Sprinkles, CMA on 10/29/2010   Method used:   [facility]        [facility]  [address] [phone]* (retail)       [address]       [place], [place]  [id number]       Ph: [phone] or [phone]       Fax: [phone]   RxID:   [id number]

## 2010-11-22 ENCOUNTER — Other Ambulatory Visit: Payer: Self-pay | Admitting: Internal Medicine

## 2010-11-22 DIAGNOSIS — E7800 Pure hypercholesterolemia, unspecified: Secondary | ICD-10-CM

## 2010-11-22 DIAGNOSIS — Z0000 Encounter for general adult medical examination without abnormal findings: Secondary | ICD-10-CM

## 2010-11-22 DIAGNOSIS — E039 Hypothyroidism, unspecified: Secondary | ICD-10-CM

## 2010-11-23 ENCOUNTER — Other Ambulatory Visit: Payer: Self-pay

## 2010-11-24 ENCOUNTER — Other Ambulatory Visit (INDEPENDENT_AMBULATORY_CARE_PROVIDER_SITE_OTHER): Payer: Medicare Other

## 2010-11-24 DIAGNOSIS — E039 Hypothyroidism, unspecified: Secondary | ICD-10-CM

## 2010-11-24 DIAGNOSIS — Z0000 Encounter for general adult medical examination without abnormal findings: Secondary | ICD-10-CM

## 2010-11-24 DIAGNOSIS — E7800 Pure hypercholesterolemia, unspecified: Secondary | ICD-10-CM

## 2010-11-24 LAB — CBC WITH DIFFERENTIAL/PLATELET
Basophils Absolute: 0 10*3/uL (ref 0.0–0.1)
Basophils Relative: 0.4 % (ref 0.0–3.0)
Eosinophils Absolute: 0.1 10*3/uL (ref 0.0–0.7)
Eosinophils Relative: 2.4 % (ref 0.0–5.0)
HCT: 41.4 % (ref 36.0–46.0)
Hemoglobin: 14.1 g/dL (ref 12.0–15.0)
Lymphocytes Relative: 23 % (ref 12.0–46.0)
Lymphs Abs: 1.3 10*3/uL (ref 0.7–4.0)
MCHC: 34 g/dL (ref 30.0–36.0)
MCV: 92.2 fl (ref 78.0–100.0)
Monocytes Absolute: 0.7 10*3/uL (ref 0.1–1.0)
Monocytes Relative: 11.3 % (ref 3.0–12.0)
Neutro Abs: 3.6 10*3/uL (ref 1.4–7.7)
Neutrophils Relative %: 62.9 % (ref 43.0–77.0)
Platelets: 234 10*3/uL (ref 150.0–400.0)
RBC: 4.49 Mil/uL (ref 3.87–5.11)
RDW: 13.8 % (ref 11.5–14.6)
WBC: 5.8 10*3/uL (ref 4.5–10.5)

## 2010-11-24 LAB — BASIC METABOLIC PANEL
BUN: 13 mg/dL (ref 6–23)
CO2: 31 mEq/L (ref 19–32)
Calcium: 9.4 mg/dL (ref 8.4–10.5)
Chloride: 103 mEq/L (ref 96–112)
Creatinine, Ser: 0.7 mg/dL (ref 0.4–1.2)
GFR: 86.82 mL/min (ref >60.00)
Glucose, Bld: 80 mg/dL (ref 70–99)
Potassium: 4.9 mEq/L (ref 3.5–5.1)
Sodium: 140 mEq/L (ref 135–145)

## 2010-11-24 LAB — HEPATIC FUNCTION PANEL
ALT: 20 U/L (ref 0–35)
AST: 31 U/L (ref 0–37)
Albumin: 3.5 g/dL (ref 3.5–5.2)
Alkaline Phosphatase: 63 U/L (ref 39–117)
Bilirubin, Direct: 0.1 mg/dL (ref 0.0–0.3)
Total Bilirubin: 0.6 mg/dL (ref 0.3–1.2)
Total Protein: 6.1 g/dL (ref 6.0–8.3)

## 2010-11-24 LAB — URINALYSIS
Bilirubin Urine: NEGATIVE
Hgb urine dipstick: NEGATIVE
Ketones, ur: NEGATIVE
Leukocytes, UA: NEGATIVE
Nitrite: NEGATIVE
Specific Gravity, Urine: 1.01 (ref 1.000–1.030)
Total Protein, Urine: NEGATIVE
Urine Glucose: NEGATIVE
Urobilinogen, UA: 0.2 (ref 0.0–1.0)
pH: 6.5 (ref 5.0–8.0)

## 2010-11-24 LAB — TSH: TSH: 2.25 u[IU]/mL (ref 0.35–5.50)

## 2010-11-24 LAB — LIPID PANEL
Cholesterol: 171 mg/dL (ref 0–200)
HDL: 65.2 mg/dL (ref >39.00)
LDL Cholesterol: 88 mg/dL (ref 0–99)
Total CHOL/HDL Ratio: 3
Triglycerides: 88 mg/dL (ref 0.0–149.0)
VLDL: 17.6 mg/dL (ref 0.0–40.0)

## 2010-11-30 ENCOUNTER — Ambulatory Visit: Payer: Self-pay | Admitting: Internal Medicine

## 2010-12-07 ENCOUNTER — Encounter: Payer: Self-pay | Admitting: Internal Medicine

## 2010-12-07 ENCOUNTER — Ambulatory Visit (INDEPENDENT_AMBULATORY_CARE_PROVIDER_SITE_OTHER): Payer: Medicare Other | Admitting: Internal Medicine

## 2010-12-07 DIAGNOSIS — E785 Hyperlipidemia, unspecified: Secondary | ICD-10-CM

## 2010-12-07 DIAGNOSIS — K5190 Ulcerative colitis, unspecified, without complications: Secondary | ICD-10-CM

## 2010-12-07 DIAGNOSIS — M810 Age-related osteoporosis without current pathological fracture: Secondary | ICD-10-CM

## 2010-12-07 DIAGNOSIS — H8109 Meniere's disease, unspecified ear: Secondary | ICD-10-CM

## 2010-12-07 DIAGNOSIS — E039 Hypothyroidism, unspecified: Secondary | ICD-10-CM | POA: Insufficient documentation

## 2010-12-07 NOTE — Assessment & Plan Note (Signed)
On Rx 

## 2010-12-07 NOTE — Assessment & Plan Note (Signed)
On meds prn 

## 2010-12-07 NOTE — Assessment & Plan Note (Signed)
Doing well 

## 2010-12-07 NOTE — Progress Notes (Signed)
  Subjective:    Patient ID: Jasmine Mccormick, female    DOB: 1933-10-05, 77 y.o.   MRN: [id number]  HPI The patient presents for a follow-up of  chronic hypertension,controlled with medicines; ostomy, hypothyroidim   Review of Systems  Constitutional: Negative for chills, diaphoresis, appetite change and fatigue. Negative for unexpected weight change.  HENT: Negative for congestion, facial swelling and neck pain.  Eyes: Negative for visual disturbance.  Respiratory: Negative for cough, shortness of breath and wheezing.  Cardiovascular: Negative for leg swelling.  Gastrointestinal: Negative for nausea, vomiting, abdominal pain and abdominal distention. Negative for diarrhea, constipation and anal bleeding.  Genitourinary: Negative for flank pain.  Musculoskeletal: Negative for myalgias, joint swelling and gait problem.  Skin: Negative for rash. Negative for pallor and wound.  Psychiatric/Behavioral: Negative for dysphoric mood. Negative for suicidal ideas, behavioral problems, confusion and decreased concentration. The patient is not nervous/anxious.     Objective:    Physical Exam  Constitutional:The patient is oriented to person, place, and time. He appears well-developed.  Obese HENT:  Mouth/Throat: Oropharynx is clear and moist.  Eyes: Conjunctivae are normal. Pupils are equal, round, and reactive to light.  Neck: Normal range of motion. No JVD present. No thyromegaly present.  Cardiovascular: Normal rate, regular rhythm, normal heart sounds and intact distal pulses. Exam reveals no gallop and no friction rub.  No murmur heard.  Pulmonary/Chest: Effort normal and breath sounds normal. No respiratory distress. He has no wheezes. He has no rales. He exhibits no tenderness.  Abdominal: Soft. Bowel sounds are normal. He exhibits no distension and no mass. There is no tenderness. There is no rebound and no guarding. Ileostomy Musculoskeletal: Normal range of motion. He exhibits no  edema and no tenderness.  Lymphadenopathy:  He has no cervical adenopathy.  Neurological: He is alert and oriented to person, place, and time. He has normal reflexes. No cranial nerve deficit. He exhibits normal muscle tone. Coordination normal.  Skin: Skin is warm and dry. No rash noted.  Psychiatric: He has a normal mood and affect. His behavior is normal. Judgment and thought content normal.         Review of Systems     Objective:   Physical Exam     Lab Results  Component Value Date   WBC 5.8 11/24/2010   HGB 14.1 11/24/2010   HCT 41.4 11/24/2010   PLT 234.0 11/24/2010   CHOL 171 11/24/2010   TRIG 88.0 11/24/2010   HDL 65.20 11/24/2010   ALT 20 11/24/2010   AST 31 11/24/2010   NA 140 11/24/2010   K 4.9 11/24/2010   CL 103 11/24/2010   CREATININE 0.7 11/24/2010   BUN 13 11/24/2010   CO2 31 11/24/2010   TSH 2.25 11/24/2010   HGBA1C 6.2* 06/06/2007       Assessment & Plan:  ULCERATIVE COLITIS Doing well  Other and Unspecified Hyperlipidemia On diet  OSTEOPOROSIS On Rx  Hypothyroidism On Rx  MENIERE'S DISEASE On meds prn

## 2010-12-07 NOTE — Assessment & Plan Note (Signed)
  On diet  

## 2010-12-31 NOTE — Procedures (Signed)
[facility]                                  PROCEDURE NOTE   NAME:Leason, LOUIS GAW                   MRN:          [id number]  DATE:06/06/2006                            DOB:          1934-02-17    PROCEDURE:  Laceration repair.   INDICATIONS FOR PROCEDURE:  V-shaped open deep laceration of right lateral  shin. Risks including scar formation, bleeding, infection, incomplete  closure, especially in view of the crush injury, and a thin flap was  explained to the patient in detail and she agreed to proceed.   DESCRIPTION OF PROCEDURE:  The area was prepped with Betadine and alcohol.  The wound injected with 4 cc of 2% Lidocaine with epinephrine. After  adequate anesthesia was obtained, the wound was cleaned mechanically and  prepped again with a fenestrated sterile prep. The laceration was repaired  with five 3.0 Ethilon sutures with the best approximation of the edges  possible. Antibiotic ointment, Telfa pad dressing, gauze dessing applied.  Ace wrap wrapped around the shin. Wound instructions provided. She tolerated  it well.   COMPLICATIONS:  None. Tetanus shot given.   FOLLOWUP:  She will come back in 10-14 days for suture removal.            ______________________________  Evie Lacks. Plotnikov, MD      AVP/MedQ  DD:  06/06/2006  DT:  06/07/2006  Job #:  [id number]

## 2011-04-06 ENCOUNTER — Telehealth: Payer: Self-pay | Admitting: *Deleted

## 2011-04-06 DIAGNOSIS — I10 Essential (primary) hypertension: Secondary | ICD-10-CM

## 2011-04-06 NOTE — Telephone Encounter (Signed)
BMET Dx HTN Thx

## 2011-04-06 NOTE — Telephone Encounter (Signed)
Patient requesting labs prior to next OV

## 2011-04-07 NOTE — Telephone Encounter (Signed)
Pt advised of lab order.

## 2011-06-15 ENCOUNTER — Other Ambulatory Visit (INDEPENDENT_AMBULATORY_CARE_PROVIDER_SITE_OTHER): Payer: Medicare Other

## 2011-06-15 DIAGNOSIS — I10 Essential (primary) hypertension: Secondary | ICD-10-CM

## 2011-06-15 LAB — BASIC METABOLIC PANEL
Chloride: 102 mEq/L (ref 96–112)
GFR: 74.31 mL/min (ref >60.00)
Glucose, Bld: 96 mg/dL (ref 70–99)
Potassium: 4.3 mEq/L (ref 3.5–5.1)
Sodium: 137 mEq/L (ref 135–145)

## 2011-06-22 ENCOUNTER — Ambulatory Visit (INDEPENDENT_AMBULATORY_CARE_PROVIDER_SITE_OTHER): Payer: Medicare Other | Admitting: Internal Medicine

## 2011-06-22 ENCOUNTER — Encounter: Payer: Self-pay | Admitting: Internal Medicine

## 2011-06-22 VITALS — BP 138/72 | HR 84 | Temp 97.6°F | Resp 16 | Wt 154.0 lb

## 2011-06-22 DIAGNOSIS — H8109 Meniere's disease, unspecified ear: Secondary | ICD-10-CM

## 2011-06-22 DIAGNOSIS — L259 Unspecified contact dermatitis, unspecified cause: Secondary | ICD-10-CM

## 2011-06-22 DIAGNOSIS — I10 Essential (primary) hypertension: Secondary | ICD-10-CM

## 2011-06-22 DIAGNOSIS — L309 Dermatitis, unspecified: Secondary | ICD-10-CM | POA: Insufficient documentation

## 2011-06-22 DIAGNOSIS — E039 Hypothyroidism, unspecified: Secondary | ICD-10-CM

## 2011-06-22 MED ORDER — TRIAMCINOLONE ACETONIDE 0.5 % EX OINT: TOPICAL_OINTMENT | Freq: Two times a day (BID) | CUTANEOUS | Status: DC

## 2011-06-22 NOTE — Progress Notes (Signed)
  Subjective:    Patient ID: Jasmine Mccormick, female    DOB: 1933-12-31, 77 y.o.   MRN: [id number]  HPI  The patient presents for a follow-up of  chronic hypertension, chronic dyslipidemia and occ stomatitis controlled with medicines    Review of Systems  Constitutional: Negative for chills, activity change, appetite change, fatigue and unexpected weight change.  HENT: Negative for congestion, mouth sores and sinus pressure.   Eyes: Negative for visual disturbance.  Respiratory: Negative for cough and chest tightness.   Gastrointestinal: Negative for nausea and abdominal pain.  Genitourinary: Negative for frequency, difficulty urinating and vaginal pain.  Musculoskeletal: Positive for back pain. Negative for gait problem.  Skin: Negative for pallor and rash.  Neurological: Negative for dizziness, tremors, weakness, numbness and headaches.  Psychiatric/Behavioral: Negative for confusion and sleep disturbance.   Wt Readings from Last 3 Encounters:  06/22/11 154 lb (69.854 kg)  12/07/10 149 lb (67.586 kg)  05/04/10 147 lb (66.679 kg)        Objective:   Physical Exam  Constitutional: She appears well-developed and well-nourished. No distress.  HENT:  Head: Normocephalic.  Right Ear: External ear normal.  Left Ear: External ear normal.  Nose: Nose normal.  Mouth/Throat: Oropharynx is clear and moist.  Eyes: Conjunctivae are normal. Pupils are equal, round, and reactive to light. Right eye exhibits no discharge. Left eye exhibits no discharge.  Neck: Normal range of motion. Neck supple. No JVD present. No tracheal deviation present. No thyromegaly present.  Cardiovascular: Normal rate, regular rhythm and normal heart sounds.   Pulmonary/Chest: No stridor. No respiratory distress. She has no wheezes.  Abdominal: Soft. Bowel sounds are normal. She exhibits no distension and no mass. There is no tenderness. There is no rebound and no guarding.  Musculoskeletal: She exhibits no  edema and no tenderness.  Lymphadenopathy:    She has no cervical adenopathy.  Neurological: She displays normal reflexes. No cranial nerve deficit. She exhibits normal muscle tone. Coordination normal.  Skin: No rash noted. No erythema.  Psychiatric: She has a normal mood and affect. Her behavior is normal. Judgment and thought content normal.    Lab Results  Component Value Date   WBC 5.8 11/24/2010   HGB 14.1 11/24/2010   HCT 41.4 11/24/2010   PLT 234.0 11/24/2010   GLUCOSE 96 06/15/2011   CHOL 171 11/24/2010   TRIG 88.0 11/24/2010   HDL 65.20 11/24/2010   LDLCALC 88 11/24/2010   ALT 20 11/24/2010   AST 31 11/24/2010   NA 137 06/15/2011   K 4.3 06/15/2011   CL 102 06/15/2011   CREATININE 0.8 06/15/2011   BUN 12 06/15/2011   CO2 27 06/15/2011   TSH 2.25 11/24/2010   HGBA1C 6.2* 06/06/2007         Assessment & Plan:

## 2011-06-23 NOTE — Assessment & Plan Note (Signed)
Triamc oint bid Cracks, dry skin

## 2011-06-23 NOTE — Assessment & Plan Note (Signed)
Continue with current prescription therapy as reflected on the Med list.  

## 2011-09-21 ENCOUNTER — Other Ambulatory Visit: Payer: Self-pay | Admitting: Internal Medicine

## 2011-09-21 DIAGNOSIS — Z1231 Encounter for screening mammogram for malignant neoplasm of breast: Secondary | ICD-10-CM

## 2011-10-05 ENCOUNTER — Ambulatory Visit
Admission: RE | Admit: 2011-10-05 | Discharge: 2011-10-05 | Disposition: A | Discharge status: Home / Self Care | Payer: Medicare Other | Source: Ambulatory Visit | Attending: Internal Medicine | Admitting: Internal Medicine

## 2011-10-05 DIAGNOSIS — Z1231 Encounter for screening mammogram for malignant neoplasm of breast: Secondary | ICD-10-CM | POA: Diagnosis not present

## 2011-10-18 ENCOUNTER — Other Ambulatory Visit: Payer: Self-pay | Admitting: Internal Medicine

## 2011-11-16 DIAGNOSIS — H35039 Hypertensive retinopathy, unspecified eye: Secondary | ICD-10-CM | POA: Diagnosis not present

## 2011-11-16 DIAGNOSIS — H40019 Open angle with borderline findings, low risk, unspecified eye: Secondary | ICD-10-CM | POA: Diagnosis not present

## 2011-11-16 DIAGNOSIS — Z961 Presence of intraocular lens: Secondary | ICD-10-CM | POA: Diagnosis not present

## 2011-12-22 ENCOUNTER — Other Ambulatory Visit: Payer: Self-pay | Admitting: *Deleted

## 2011-12-22 DIAGNOSIS — E069 Thyroiditis, unspecified: Secondary | ICD-10-CM

## 2011-12-22 DIAGNOSIS — I10 Essential (primary) hypertension: Secondary | ICD-10-CM

## 2011-12-23 ENCOUNTER — Other Ambulatory Visit (INDEPENDENT_AMBULATORY_CARE_PROVIDER_SITE_OTHER): Payer: Medicare Other

## 2011-12-23 DIAGNOSIS — E069 Thyroiditis, unspecified: Secondary | ICD-10-CM | POA: Diagnosis not present

## 2011-12-23 DIAGNOSIS — I10 Essential (primary) hypertension: Secondary | ICD-10-CM | POA: Diagnosis not present

## 2011-12-23 LAB — TSH: TSH: 0.87 u[IU]/mL (ref 0.35–5.50)

## 2011-12-23 LAB — BASIC METABOLIC PANEL
Chloride: 104 mEq/L (ref 96–112)
Potassium: 4.4 mEq/L (ref 3.5–5.1)

## 2011-12-28 ENCOUNTER — Encounter: Payer: Self-pay | Admitting: Internal Medicine

## 2011-12-28 ENCOUNTER — Ambulatory Visit (INDEPENDENT_AMBULATORY_CARE_PROVIDER_SITE_OTHER): Payer: Medicare Other | Admitting: Internal Medicine

## 2011-12-28 VITALS — BP 150/78 | HR 88 | Temp 97.6°F | Resp 16 | Wt 151.0 lb

## 2011-12-28 DIAGNOSIS — I10 Essential (primary) hypertension: Secondary | ICD-10-CM

## 2011-12-28 DIAGNOSIS — H8109 Meniere's disease, unspecified ear: Secondary | ICD-10-CM

## 2011-12-28 DIAGNOSIS — E039 Hypothyroidism, unspecified: Secondary | ICD-10-CM | POA: Diagnosis not present

## 2011-12-28 DIAGNOSIS — K5190 Ulcerative colitis, unspecified, without complications: Secondary | ICD-10-CM

## 2011-12-28 DIAGNOSIS — R202 Paresthesia of skin: Secondary | ICD-10-CM

## 2011-12-28 DIAGNOSIS — R209 Unspecified disturbances of skin sensation: Secondary | ICD-10-CM

## 2011-12-28 DIAGNOSIS — M810 Age-related osteoporosis without current pathological fracture: Secondary | ICD-10-CM

## 2011-12-28 DIAGNOSIS — E559 Vitamin D deficiency, unspecified: Secondary | ICD-10-CM

## 2011-12-28 MED ORDER — AMITRIPTYLINE HCL 25 MG PO TABS: 25.0000 mg | ORAL_TABLET | Freq: Every day | ORAL | Status: DC

## 2011-12-28 MED ORDER — SYNTHROID 75 MCG PO TABS: 75.0000 ug | ORAL_TABLET | Freq: Every day | ORAL | Status: DC

## 2011-12-28 MED ORDER — VERAPAMIL HCL 80 MG PO TABS: 80.0000 mg | ORAL_TABLET | Freq: Two times a day (BID) | ORAL | Status: DC

## 2011-12-28 MED ORDER — EZETIMIBE-SIMVASTATIN 10-20 MG PO TABS: 1.0000 | ORAL_TABLET | Freq: Every day | ORAL | Status: DC

## 2011-12-28 MED ORDER — PERINDOPRIL ERBUMINE 8 MG PO TABS: 8.0000 mg | ORAL_TABLET | Freq: Every day | ORAL | Status: DC

## 2011-12-28 NOTE — Patient Instructions (Signed)
BP Readings from Last 3 Encounters:  12/28/11 150/78  06/22/11 138/72  12/07/10 140/80

## 2011-12-28 NOTE — Assessment & Plan Note (Signed)
Continue with current prescription therapy as reflected on the Med list.  

## 2011-12-28 NOTE — Progress Notes (Signed)
Patient ID: Jasmine Mccormick, female   DOB: 09/04/1933, 78 y.o.   MRN: [id number]  Subjective:    Patient ID: Jasmine Mccormick, female    DOB: May 31, 1934, 78 y.o.   MRN: [id number]  HPI  The patient presents for a follow-up of chronic colitis, hypertension, chronic dyslipidemia and occ stomatitis controlled with medicines    Review of Systems  Constitutional: Negative for chills, activity change, appetite change, fatigue and unexpected weight change.  HENT: Negative for congestion, mouth sores and sinus pressure.   Eyes: Negative for visual disturbance.  Respiratory: Negative for cough and chest tightness.   Gastrointestinal: Negative for nausea and abdominal pain.  Genitourinary: Negative for frequency, difficulty urinating and vaginal pain.  Musculoskeletal: Positive for back pain. Negative for gait problem.  Skin: Negative for pallor and rash.  Neurological: Negative for dizziness, tremors, weakness, numbness and headaches.  Psychiatric/Behavioral: Negative for confusion and sleep disturbance.   Wt Readings from Last 3 Encounters:  12/28/11 151 lb (68.493 kg)  06/22/11 154 lb (69.854 kg)  12/07/10 149 lb (67.586 kg)   BP Readings from Last 3 Encounters:  12/28/11 150/78  06/22/11 138/72  12/07/10 140/80        Objective:   Physical Exam  Constitutional: She appears well-developed and well-nourished. No distress.  HENT:  Head: Normocephalic.  Right Ear: External ear normal.  Left Ear: External ear normal.  Nose: Nose normal.  Mouth/Throat: Oropharynx is clear and moist.  Eyes: Conjunctivae are normal. Pupils are equal, round, and reactive to light. Right eye exhibits no discharge. Left eye exhibits no discharge.  Neck: Normal range of motion. Neck supple. No JVD present. No tracheal deviation present. No thyromegaly present.  Cardiovascular: Normal rate, regular rhythm and normal heart sounds.   Pulmonary/Chest: No stridor. No respiratory distress. She has no  wheezes.  Abdominal: Soft. Bowel sounds are normal. She exhibits no distension and no mass. There is no tenderness. There is no rebound and no guarding.  Musculoskeletal: She exhibits no edema and no tenderness.  Lymphadenopathy:    She has no cervical adenopathy.  Neurological: She displays normal reflexes. No cranial nerve deficit. She exhibits normal muscle tone. Coordination normal.  Skin: No rash noted. No erythema.  Psychiatric: She has a normal mood and affect. Her behavior is normal. Judgment and thought content normal.    Lab Results  Component Value Date   WBC 5.8 11/24/2010   HGB 14.1 11/24/2010   HCT 41.4 11/24/2010   PLT 234.0 11/24/2010   GLUCOSE 83 12/23/2011   CHOL 171 11/24/2010   TRIG 88.0 11/24/2010   HDL 65.20 11/24/2010   LDLCALC 88 11/24/2010   ALT 20 11/24/2010   AST 31 11/24/2010   NA 139 12/23/2011   K 4.4 12/23/2011   CL 104 12/23/2011   CREATININE 0.7 12/23/2011   BUN 13 12/23/2011   CO2 28 12/23/2011   TSH 0.87 12/23/2011   HGBA1C 6.2* 06/06/2007         Assessment & Plan:

## 2011-12-28 NOTE — Assessment & Plan Note (Signed)
Doing well post-op

## 2012-01-30 DIAGNOSIS — H40019 Open angle with borderline findings, low risk, unspecified eye: Secondary | ICD-10-CM | POA: Diagnosis not present

## 2012-05-31 DIAGNOSIS — H40019 Open angle with borderline findings, low risk, unspecified eye: Secondary | ICD-10-CM | POA: Diagnosis not present

## 2012-05-31 DIAGNOSIS — H02839 Dermatochalasis of unspecified eye, unspecified eyelid: Secondary | ICD-10-CM | POA: Diagnosis not present

## 2012-06-01 DIAGNOSIS — Z23 Encounter for immunization: Secondary | ICD-10-CM | POA: Diagnosis not present

## 2012-06-28 ENCOUNTER — Other Ambulatory Visit (INDEPENDENT_AMBULATORY_CARE_PROVIDER_SITE_OTHER): Payer: Medicare Other

## 2012-06-28 DIAGNOSIS — I10 Essential (primary) hypertension: Secondary | ICD-10-CM

## 2012-06-28 DIAGNOSIS — K5190 Ulcerative colitis, unspecified, without complications: Secondary | ICD-10-CM

## 2012-06-28 DIAGNOSIS — H8109 Meniere's disease, unspecified ear: Secondary | ICD-10-CM

## 2012-06-28 DIAGNOSIS — R202 Paresthesia of skin: Secondary | ICD-10-CM

## 2012-06-28 DIAGNOSIS — M810 Age-related osteoporosis without current pathological fracture: Secondary | ICD-10-CM | POA: Diagnosis not present

## 2012-06-28 DIAGNOSIS — E039 Hypothyroidism, unspecified: Secondary | ICD-10-CM

## 2012-06-28 DIAGNOSIS — R209 Unspecified disturbances of skin sensation: Secondary | ICD-10-CM | POA: Diagnosis not present

## 2012-06-28 LAB — LIPID PANEL
HDL: 67.7 mg/dL (ref >39.00)
Triglycerides: 109 mg/dL (ref 0.0–149.0)

## 2012-06-28 LAB — URINALYSIS
Ketones, ur: NEGATIVE
Specific Gravity, Urine: 1.01 (ref 1.000–1.030)
Urine Glucose: NEGATIVE
pH: 6 (ref 5.0–8.0)

## 2012-06-28 LAB — CBC WITH DIFFERENTIAL/PLATELET
Basophils Absolute: 0 10*3/uL (ref 0.0–0.1)
Eosinophils Absolute: 0.2 10*3/uL (ref 0.0–0.7)
Lymphocytes Relative: 18.7 % (ref 12.0–46.0)
MCHC: 33.3 g/dL (ref 30.0–36.0)
Monocytes Absolute: 0.7 10*3/uL (ref 0.1–1.0)
Neutrophils Relative %: 70.1 % (ref 43.0–77.0)
Platelets: 241 10*3/uL (ref 150.0–400.0)
RDW: 13 % (ref 11.5–14.6)

## 2012-06-28 LAB — BASIC METABOLIC PANEL
BUN: 13 mg/dL (ref 6–23)
CO2: 26 mEq/L (ref 19–32)
Calcium: 9.2 mg/dL (ref 8.4–10.5)
Creatinine, Ser: 0.8 mg/dL (ref 0.4–1.2)
Glucose, Bld: 118 mg/dL — ABNORMAL HIGH (ref 70–99)

## 2012-06-28 LAB — HEPATIC FUNCTION PANEL
ALT: 25 U/L (ref 0–35)
AST: 33 U/L (ref 0–37)
Albumin: 4 g/dL (ref 3.5–5.2)
Alkaline Phosphatase: 73 U/L (ref 39–117)

## 2012-06-29 LAB — VITAMIN D 25 HYDROXY (VIT D DEFICIENCY, FRACTURES): Vit D, 25-Hydroxy: 51 ng/mL (ref 30–89)

## 2012-07-04 ENCOUNTER — Encounter: Payer: Self-pay | Admitting: Internal Medicine

## 2012-07-04 ENCOUNTER — Ambulatory Visit (INDEPENDENT_AMBULATORY_CARE_PROVIDER_SITE_OTHER): Payer: Medicare Other | Admitting: Internal Medicine

## 2012-07-04 VITALS — BP 158/82 | HR 80 | Temp 96.5°F | Resp 16 | Ht 66.5 in | Wt 156.1 lb

## 2012-07-04 DIAGNOSIS — I10 Essential (primary) hypertension: Secondary | ICD-10-CM

## 2012-07-04 DIAGNOSIS — K5190 Ulcerative colitis, unspecified, without complications: Secondary | ICD-10-CM

## 2012-07-04 DIAGNOSIS — Z23 Encounter for immunization: Secondary | ICD-10-CM

## 2012-07-04 DIAGNOSIS — Z0000 Encounter for general adult medical examination without abnormal findings: Secondary | ICD-10-CM | POA: Insufficient documentation

## 2012-07-04 DIAGNOSIS — R739 Hyperglycemia, unspecified: Secondary | ICD-10-CM | POA: Insufficient documentation

## 2012-07-04 DIAGNOSIS — E039 Hypothyroidism, unspecified: Secondary | ICD-10-CM

## 2012-07-04 NOTE — Assessment & Plan Note (Signed)
Check A1c. 

## 2012-07-04 NOTE — Assessment & Plan Note (Signed)

## 2012-07-04 NOTE — Progress Notes (Signed)
   Subjective:    Patient ID: Jasmine Mccormick, female    DOB: 03-Feb-1934, 78 y.o.   MRN: [id number]  HPI The patient is here for a wellness exam. The patient has been doing well overall without major physical or psychological issues going on lately. The patient presents for a follow-up of chronic colitis, hypertension, chronic dyslipidemia and occ stomatitis controlled with medicines. BP is ok at home...    Review of Systems  Constitutional: Negative for chills, activity change, appetite change, fatigue and unexpected weight change.  HENT: Negative for congestion, mouth sores and sinus pressure.   Eyes: Negative for visual disturbance.  Respiratory: Negative for cough and chest tightness.   Gastrointestinal: Negative for nausea and abdominal pain.  Genitourinary: Negative for frequency, difficulty urinating and vaginal pain.  Musculoskeletal: Positive for back pain. Negative for gait problem.  Skin: Negative for pallor and rash.  Neurological: Negative for dizziness, tremors, weakness, numbness and headaches.  Psychiatric/Behavioral: Negative for confusion and sleep disturbance.   Wt Readings from Last 3 Encounters:  07/04/12 156 lb 2 oz (70.818 kg)  12/28/11 151 lb (68.493 kg)  06/22/11 154 lb (69.854 kg)   BP Readings from Last 3 Encounters:  07/04/12 158/82  12/28/11 150/78  06/22/11 138/72        Objective:   Physical Exam  Constitutional: She appears well-developed and well-nourished. No distress.  HENT:  Head: Normocephalic.  Right Ear: External ear normal.  Left Ear: External ear normal.  Nose: Nose normal.  Mouth/Throat: Oropharynx is clear and moist.  Eyes: Conjunctivae normal are normal. Pupils are equal, round, and reactive to light. Right eye exhibits no discharge. Left eye exhibits no discharge.  Neck: Normal range of motion. Neck supple. No JVD present. No tracheal deviation present. No thyromegaly present.  Cardiovascular: Normal rate, regular rhythm  and normal heart sounds.   Pulmonary/Chest: No stridor. No respiratory distress. She has no wheezes.  Abdominal: Soft. Bowel sounds are normal. She exhibits no distension and no mass. There is no tenderness. There is no rebound and no guarding.  Musculoskeletal: She exhibits no edema and no tenderness.  Lymphadenopathy:    She has no cervical adenopathy.  Neurological: She displays normal reflexes. No cranial nerve deficit. She exhibits normal muscle tone. Coordination normal.  Skin: No rash noted. No erythema.  Psychiatric: She has a normal mood and affect. Her behavior is normal. Judgment and thought content normal.    Lab Results  Component Value Date   WBC 7.7 06/28/2012   HGB 15.1* 06/28/2012   HCT 45.4 06/28/2012   PLT 241.0 06/28/2012   GLUCOSE 118* 06/28/2012   CHOL 186 06/28/2012   TRIG 109.0 06/28/2012   HDL 67.70 06/28/2012   LDLCALC 97 06/28/2012   ALT 25 06/28/2012   AST 33 06/28/2012   NA 136 06/28/2012   K 4.2 06/28/2012   CL 100 06/28/2012   CREATININE 0.8 06/28/2012   BUN 13 06/28/2012   CO2 26 06/28/2012   TSH 1.53 06/28/2012   HGBA1C 6.2* 06/06/2007         Assessment & Plan:

## 2012-07-04 NOTE — Assessment & Plan Note (Signed)
Continue with current prescription therapy as reflected on the Med list.  

## 2012-07-04 NOTE — Assessment & Plan Note (Signed)
Continue with current management

## 2012-07-05 ENCOUNTER — Encounter: Payer: Self-pay | Admitting: Internal Medicine

## 2012-09-13 ENCOUNTER — Other Ambulatory Visit: Payer: Self-pay | Admitting: Internal Medicine

## 2012-09-13 DIAGNOSIS — Z1231 Encounter for screening mammogram for malignant neoplasm of breast: Secondary | ICD-10-CM

## 2012-10-10 ENCOUNTER — Ambulatory Visit
Admission: RE | Admit: 2012-10-10 | Discharge: 2012-10-10 | Disposition: A | Discharge status: Home / Self Care | Payer: Medicare Other | Source: Ambulatory Visit | Attending: Internal Medicine | Admitting: Internal Medicine

## 2012-10-10 DIAGNOSIS — Z1231 Encounter for screening mammogram for malignant neoplasm of breast: Secondary | ICD-10-CM

## 2013-01-03 ENCOUNTER — Other Ambulatory Visit (INDEPENDENT_AMBULATORY_CARE_PROVIDER_SITE_OTHER): Payer: Medicare Other

## 2013-01-03 DIAGNOSIS — E039 Hypothyroidism, unspecified: Secondary | ICD-10-CM

## 2013-01-03 DIAGNOSIS — R7309 Other abnormal glucose: Secondary | ICD-10-CM

## 2013-01-03 DIAGNOSIS — I10 Essential (primary) hypertension: Secondary | ICD-10-CM | POA: Diagnosis not present

## 2013-01-03 DIAGNOSIS — K5190 Ulcerative colitis, unspecified, without complications: Secondary | ICD-10-CM | POA: Diagnosis not present

## 2013-01-03 DIAGNOSIS — R739 Hyperglycemia, unspecified: Secondary | ICD-10-CM

## 2013-01-03 LAB — BASIC METABOLIC PANEL
CO2: 27 mEq/L (ref 19–32)
Chloride: 103 mEq/L (ref 96–112)
Glucose, Bld: 91 mg/dL (ref 70–99)
Sodium: 137 mEq/L (ref 135–145)

## 2013-01-03 LAB — HEMOGLOBIN A1C: Hgb A1c MFr Bld: 6.2 % (ref 4.6–6.5)

## 2013-01-09 ENCOUNTER — Encounter: Payer: Self-pay | Admitting: Internal Medicine

## 2013-01-09 ENCOUNTER — Ambulatory Visit (INDEPENDENT_AMBULATORY_CARE_PROVIDER_SITE_OTHER): Payer: Medicare Other | Admitting: Internal Medicine

## 2013-01-09 VITALS — BP 174/86 | HR 76 | Temp 97.4°F | Resp 16 | Wt 150.0 lb

## 2013-01-09 DIAGNOSIS — H8109 Meniere's disease, unspecified ear: Secondary | ICD-10-CM

## 2013-01-09 DIAGNOSIS — E039 Hypothyroidism, unspecified: Secondary | ICD-10-CM | POA: Diagnosis not present

## 2013-01-09 DIAGNOSIS — R7309 Other abnormal glucose: Secondary | ICD-10-CM | POA: Diagnosis not present

## 2013-01-09 DIAGNOSIS — R739 Hyperglycemia, unspecified: Secondary | ICD-10-CM

## 2013-01-09 DIAGNOSIS — I10 Essential (primary) hypertension: Secondary | ICD-10-CM

## 2013-01-09 DIAGNOSIS — M810 Age-related osteoporosis without current pathological fracture: Secondary | ICD-10-CM | POA: Diagnosis not present

## 2013-01-09 MED ORDER — PERINDOPRIL ERBUMINE 8 MG PO TABS: 8.0000 mg | ORAL_TABLET | Freq: Every day | ORAL | Status: DC

## 2013-01-09 MED ORDER — EZETIMIBE-SIMVASTATIN 10-20 MG PO TABS: 1.0000 | ORAL_TABLET | Freq: Every day | ORAL | Status: DC

## 2013-01-09 MED ORDER — METOPROLOL SUCCINATE ER 25 MG PO TB24: 25.0000 mg | ORAL_TABLET | Freq: Every day | ORAL | Status: DC

## 2013-01-09 MED ORDER — SYNTHROID 75 MCG PO TABS: 75.0000 ug | ORAL_TABLET | Freq: Every day | ORAL | Status: DC

## 2013-01-09 MED ORDER — VERAPAMIL HCL 80 MG PO TABS: 80.0000 mg | ORAL_TABLET | Freq: Two times a day (BID) | ORAL | Status: DC

## 2013-01-09 NOTE — Assessment & Plan Note (Signed)
Vit D 

## 2013-01-09 NOTE — Progress Notes (Signed)
   Subjective:   HPI  The patient presents for a follow-up of chronic colitis, hypertension, chronic dyslipidemia and occ stomatitis controlled with medicines. BP is ok at home...    Review of Systems  Constitutional: Negative for chills, activity change, appetite change, fatigue and unexpected weight change.  HENT: Negative for congestion, mouth sores and sinus pressure.   Eyes: Negative for visual disturbance.  Respiratory: Negative for cough and chest tightness.   Gastrointestinal: Negative for nausea and abdominal pain.  Genitourinary: Negative for frequency, difficulty urinating and vaginal pain.  Musculoskeletal: Positive for back pain. Negative for gait problem.  Skin: Negative for pallor and rash.  Neurological: Negative for dizziness, tremors, weakness, numbness and headaches.  Psychiatric/Behavioral: Negative for confusion and sleep disturbance.   Wt Readings from Last 3 Encounters:  01/09/13 150 lb (68.04 kg)  07/04/12 156 lb 2 oz (70.818 kg)  12/28/11 151 lb (68.493 kg)   BP Readings from Last 3 Encounters:  01/09/13 174/86  07/04/12 158/82  12/28/11 150/78        Objective:   Physical Exam  Constitutional: She appears well-developed and well-nourished. No distress.  HENT:  Head: Normocephalic.  Right Ear: External ear normal.  Left Ear: External ear normal.  Nose: Nose normal.  Mouth/Throat: Oropharynx is clear and moist.  Eyes: Conjunctivae are normal. Pupils are equal, round, and reactive to light. Right eye exhibits no discharge. Left eye exhibits no discharge.  Neck: Normal range of motion. Neck supple. No JVD present. No tracheal deviation present. No thyromegaly present.  Cardiovascular: Normal rate, regular rhythm and normal heart sounds.   Pulmonary/Chest: No stridor. No respiratory distress. She has no wheezes.  Abdominal: Soft. Bowel sounds are normal. She exhibits no distension and no mass. There is no tenderness. There is no rebound and no  guarding.  Musculoskeletal: She exhibits no edema and no tenderness.  Lymphadenopathy:    She has no cervical adenopathy.  Neurological: She displays normal reflexes. No cranial nerve deficit. She exhibits normal muscle tone. Coordination normal.  Skin: No rash noted. No erythema.  Psychiatric: She has a normal mood and affect. Her behavior is normal. Judgment and thought content normal.    Lab Results  Component Value Date   WBC 7.7 06/28/2012   HGB 15.1* 06/28/2012   HCT 45.4 06/28/2012   PLT 241.0 06/28/2012   GLUCOSE 91 01/03/2013   CHOL 186 06/28/2012   TRIG 109.0 06/28/2012   HDL 67.70 06/28/2012   LDLCALC 97 06/28/2012   ALT 25 06/28/2012   AST 33 06/28/2012   NA 137 01/03/2013   K 4.6 01/03/2013   CL 103 01/03/2013   CREATININE 0.8 01/03/2013   BUN 12 01/03/2013   CO2 27 01/03/2013   TSH 1.53 06/28/2012   HGBA1C 6.2 01/03/2013         Assessment & Plan:

## 2013-01-09 NOTE — Assessment & Plan Note (Addendum)
Rechecked BP 180/90 Added Toprol XL 25 mg qhs

## 2013-01-09 NOTE — Assessment & Plan Note (Signed)
Not on rx 

## 2013-01-09 NOTE — Assessment & Plan Note (Signed)
Watching  

## 2013-01-09 NOTE — Assessment & Plan Note (Signed)
Continue with current prescription therapy as reflected on the Med list.  

## 2013-02-05 DIAGNOSIS — Z961 Presence of intraocular lens: Secondary | ICD-10-CM | POA: Diagnosis not present

## 2013-02-05 DIAGNOSIS — H35369 Drusen (degenerative) of macula, unspecified eye: Secondary | ICD-10-CM | POA: Diagnosis not present

## 2013-02-05 DIAGNOSIS — H35039 Hypertensive retinopathy, unspecified eye: Secondary | ICD-10-CM | POA: Diagnosis not present

## 2013-02-05 DIAGNOSIS — H40019 Open angle with borderline findings, low risk, unspecified eye: Secondary | ICD-10-CM | POA: Diagnosis not present

## 2013-05-08 DIAGNOSIS — H01009 Unspecified blepharitis unspecified eye, unspecified eyelid: Secondary | ICD-10-CM | POA: Diagnosis not present

## 2013-05-08 DIAGNOSIS — H02839 Dermatochalasis of unspecified eye, unspecified eyelid: Secondary | ICD-10-CM | POA: Diagnosis not present

## 2013-05-08 DIAGNOSIS — H40019 Open angle with borderline findings, low risk, unspecified eye: Secondary | ICD-10-CM | POA: Diagnosis not present

## 2013-05-09 DIAGNOSIS — Z23 Encounter for immunization: Secondary | ICD-10-CM | POA: Diagnosis not present

## 2013-08-01 ENCOUNTER — Ambulatory Visit (INDEPENDENT_AMBULATORY_CARE_PROVIDER_SITE_OTHER): Payer: Medicare Other | Admitting: Internal Medicine

## 2013-08-01 ENCOUNTER — Encounter: Payer: Self-pay | Admitting: Internal Medicine

## 2013-08-01 VITALS — BP 182/90 | HR 80 | Temp 96.7°F | Resp 16 | Ht 66.0 in | Wt 150.0 lb

## 2013-08-01 DIAGNOSIS — H6123 Impacted cerumen, bilateral: Secondary | ICD-10-CM

## 2013-08-01 DIAGNOSIS — M545 Low back pain, unspecified: Secondary | ICD-10-CM | POA: Diagnosis not present

## 2013-08-01 DIAGNOSIS — L259 Unspecified contact dermatitis, unspecified cause: Secondary | ICD-10-CM | POA: Diagnosis not present

## 2013-08-01 DIAGNOSIS — I10 Essential (primary) hypertension: Secondary | ICD-10-CM

## 2013-08-01 DIAGNOSIS — E039 Hypothyroidism, unspecified: Secondary | ICD-10-CM

## 2013-08-01 DIAGNOSIS — Z0000 Encounter for general adult medical examination without abnormal findings: Secondary | ICD-10-CM

## 2013-08-01 DIAGNOSIS — L309 Dermatitis, unspecified: Secondary | ICD-10-CM

## 2013-08-01 DIAGNOSIS — H6120 Impacted cerumen, unspecified ear: Secondary | ICD-10-CM

## 2013-08-01 DIAGNOSIS — Z23 Encounter for immunization: Secondary | ICD-10-CM | POA: Diagnosis not present

## 2013-08-01 DIAGNOSIS — R739 Hyperglycemia, unspecified: Secondary | ICD-10-CM

## 2013-08-01 DIAGNOSIS — E785 Hyperlipidemia, unspecified: Secondary | ICD-10-CM

## 2013-08-01 MED ORDER — VERAPAMIL HCL 80 MG PO TABS: 80.0000 mg | ORAL_TABLET | Freq: Two times a day (BID) | ORAL | Status: DC

## 2013-08-01 MED ORDER — SYNTHROID 75 MCG PO TABS: 75.0000 ug | ORAL_TABLET | Freq: Every day | ORAL | Status: DC

## 2013-08-01 MED ORDER — PERINDOPRIL ERBUMINE 8 MG PO TABS: 8.0000 mg | ORAL_TABLET | Freq: Every day | ORAL | Status: DC

## 2013-08-01 MED ORDER — EZETIMIBE-SIMVASTATIN 10-20 MG PO TABS: 1.0000 | ORAL_TABLET | Freq: Every day | ORAL | Status: DC

## 2013-08-01 MED ORDER — METOPROLOL SUCCINATE ER 25 MG PO TB24: 25.0000 mg | ORAL_TABLET | Freq: Every day | ORAL | Status: DC

## 2013-08-01 NOTE — Progress Notes (Signed)
Subjective:    HPI The patient is here for a wellness exam. The patient has been doing well overall without major physical or psychological issues going on lately.  The patient presents for a follow-up of chronic colitis, hypertension, chronic dyslipidemia and occ stomatitis controlled with medicines.   BP is nl at home...    Review of Systems  Constitutional: Negative for chills, activity change, appetite change, fatigue and unexpected weight change.  HENT: Negative for congestion, mouth sores and sinus pressure.   Eyes: Negative for visual disturbance.  Respiratory: Negative for cough and chest tightness.   Gastrointestinal: Negative for nausea and abdominal pain.  Genitourinary: Negative for frequency, difficulty urinating and vaginal pain.  Musculoskeletal: Positive for back pain. Negative for gait problem.  Skin: Negative for pallor and rash.  Neurological: Negative for dizziness, tremors, weakness, numbness and headaches.  Psychiatric/Behavioral: Negative for confusion and sleep disturbance.   Wt Readings from Last 3 Encounters:  08/01/13 150 lb (68.04 kg)  01/09/13 150 lb (68.04 kg)  07/04/12 156 lb 2 oz (70.818 kg)   BP Readings from Last 3 Encounters:  08/01/13 182/90  01/09/13 174/86  07/04/12 158/82        Objective:   Physical Exam  Constitutional: She appears well-developed and well-nourished. No distress.  HENT:  Head: Normocephalic.  Right Ear: External ear normal.  Left Ear: External ear normal.  Nose: Nose normal.  Mouth/Throat: Oropharynx is clear and moist.  Eyes: Conjunctivae are normal. Pupils are equal, round, and reactive to light. Right eye exhibits no discharge. Left eye exhibits no discharge.  Neck: Normal range of motion. Neck supple. No JVD present. No tracheal deviation present. No thyromegaly present.  Cardiovascular: Normal rate, regular rhythm and normal heart sounds.   Pulmonary/Chest: No stridor. No respiratory distress. She has  no wheezes.  Abdominal: Soft. Bowel sounds are normal. She exhibits no distension and no mass. There is no tenderness. There is no rebound and no guarding.  Musculoskeletal: She exhibits no edema and no tenderness.  Lymphadenopathy:    She has no cervical adenopathy.  Neurological: She displays normal reflexes. No cranial nerve deficit. She exhibits normal muscle tone. Coordination normal.  Skin: No rash noted. No erythema.  Psychiatric: She has a normal mood and affect. Her behavior is normal. Judgment and thought content normal.    Lab Results  Component Value Date   WBC 7.7 06/28/2012   HGB 15.1* 06/28/2012   HCT 45.4 06/28/2012   PLT 241.0 06/28/2012   GLUCOSE 91 01/03/2013   CHOL 186 06/28/2012   TRIG 109.0 06/28/2012   HDL 67.70 06/28/2012   LDLCALC 97 06/28/2012   ALT 25 06/28/2012   AST 33 06/28/2012   NA 137 01/03/2013   K 4.6 01/03/2013   CL 103 01/03/2013   CREATININE 0.8 01/03/2013   BUN 12 01/03/2013   CO2 27 01/03/2013   TSH 1.53 06/28/2012   HGBA1C 6.2 01/03/2013     Procedure Note :     Procedure :  Ear irrigation   Indication:  Cerumen impaction   Risks, including pain, dizziness, eardrum perforation, bleeding, infection and others as well as benefits were explained to the patient in detail. Verbal consent was obtained and the patient agreed to proceed.    We used "The Elephant Ear Irrigation Device" filled with lukewarm water for irrigation. A large amount wax was recovered. Procedure has also required manual wax removal with an ear loop.   Tolerated well. Complications: None.   Postprocedure  instructions :  Call if problems.       Assessment & Plan:

## 2013-08-01 NOTE — Progress Notes (Signed)
Pre visit review using our clinic review tool, if applicable. No additional management support is needed unless otherwise documented below in the visit note. 

## 2013-08-01 NOTE — Assessment & Plan Note (Signed)
A1c

## 2013-08-01 NOTE — Assessment & Plan Note (Signed)
Cream

## 2013-08-01 NOTE — Assessment & Plan Note (Signed)
Chronic BP is ok at home - always nl No change in rx

## 2013-08-01 NOTE — Assessment & Plan Note (Signed)

## 2013-08-01 NOTE — Assessment & Plan Note (Signed)
Continue with current prescription therapy as reflected on the Med list.  

## 2013-08-01 NOTE — Assessment & Plan Note (Signed)
B ears See procedure

## 2013-08-13 ENCOUNTER — Other Ambulatory Visit (INDEPENDENT_AMBULATORY_CARE_PROVIDER_SITE_OTHER): Payer: Medicare Other

## 2013-08-13 DIAGNOSIS — R7309 Other abnormal glucose: Secondary | ICD-10-CM | POA: Diagnosis not present

## 2013-08-13 DIAGNOSIS — Z0000 Encounter for general adult medical examination without abnormal findings: Secondary | ICD-10-CM | POA: Diagnosis not present

## 2013-08-13 DIAGNOSIS — L259 Unspecified contact dermatitis, unspecified cause: Secondary | ICD-10-CM

## 2013-08-13 DIAGNOSIS — E039 Hypothyroidism, unspecified: Secondary | ICD-10-CM

## 2013-08-13 DIAGNOSIS — E785 Hyperlipidemia, unspecified: Secondary | ICD-10-CM | POA: Diagnosis not present

## 2013-08-13 DIAGNOSIS — H6123 Impacted cerumen, bilateral: Secondary | ICD-10-CM

## 2013-08-13 DIAGNOSIS — H6120 Impacted cerumen, unspecified ear: Secondary | ICD-10-CM

## 2013-08-13 DIAGNOSIS — L309 Dermatitis, unspecified: Secondary | ICD-10-CM

## 2013-08-13 DIAGNOSIS — I10 Essential (primary) hypertension: Secondary | ICD-10-CM

## 2013-08-13 DIAGNOSIS — R739 Hyperglycemia, unspecified: Secondary | ICD-10-CM

## 2013-08-13 LAB — URINALYSIS
Bilirubin Urine: NEGATIVE
Nitrite: NEGATIVE
Total Protein, Urine: NEGATIVE
pH: 7 (ref 5.0–8.0)

## 2013-08-13 LAB — CBC WITH DIFFERENTIAL/PLATELET
Basophils Relative: 0.5 % (ref 0.0–3.0)
Eosinophils Absolute: 0.2 10*3/uL (ref 0.0–0.7)
HCT: 42.7 % (ref 36.0–46.0)
Hemoglobin: 14.4 g/dL (ref 12.0–15.0)
Lymphocytes Relative: 18.7 % (ref 12.0–46.0)
MCHC: 33.8 g/dL (ref 30.0–36.0)
MCV: 90.7 fl (ref 78.0–100.0)
Neutro Abs: 5.7 10*3/uL (ref 1.4–7.7)
RBC: 4.71 Mil/uL (ref 3.87–5.11)

## 2013-08-13 LAB — BASIC METABOLIC PANEL
CO2: 27 mEq/L (ref 19–32)
Calcium: 9.3 mg/dL (ref 8.4–10.5)
Chloride: 104 mEq/L (ref 96–112)
Potassium: 4.1 mEq/L (ref 3.5–5.1)
Sodium: 138 mEq/L (ref 135–145)

## 2013-08-13 LAB — LIPID PANEL
HDL: 64.9 mg/dL (ref >39.00)
LDL Cholesterol: 96 mg/dL (ref 0–99)
Total CHOL/HDL Ratio: 3
Triglycerides: 73 mg/dL (ref 0.0–149.0)
VLDL: 14.6 mg/dL (ref 0.0–40.0)

## 2013-08-13 LAB — HEPATIC FUNCTION PANEL: Albumin: 3.7 g/dL (ref 3.5–5.2)

## 2013-08-16 ENCOUNTER — Telehealth: Payer: Self-pay | Admitting: *Deleted

## 2013-08-16 NOTE — Telephone Encounter (Signed)
08/13/2013 labs mailed to pt per pt request.

## 2014-01-07 ENCOUNTER — Other Ambulatory Visit: Payer: Self-pay

## 2014-01-07 DIAGNOSIS — Z1231 Encounter for screening mammogram for malignant neoplasm of breast: Secondary | ICD-10-CM

## 2014-01-23 ENCOUNTER — Ambulatory Visit: Payer: Medicare Other

## 2014-01-23 ENCOUNTER — Ambulatory Visit
Admission: RE | Admit: 2014-01-23 | Discharge: 2014-01-23 | Disposition: A | Discharge status: Home / Self Care | Payer: Medicare Other | Source: Ambulatory Visit

## 2014-01-23 DIAGNOSIS — Z1231 Encounter for screening mammogram for malignant neoplasm of breast: Secondary | ICD-10-CM | POA: Diagnosis not present

## 2014-01-29 ENCOUNTER — Ambulatory Visit: Payer: Medicare Other | Admitting: Internal Medicine

## 2014-02-12 ENCOUNTER — Encounter: Payer: Self-pay | Admitting: Internal Medicine

## 2014-02-12 ENCOUNTER — Ambulatory Visit (INDEPENDENT_AMBULATORY_CARE_PROVIDER_SITE_OTHER): Payer: Medicare Other | Admitting: Internal Medicine

## 2014-02-12 VITALS — BP 160/78 | HR 84 | Temp 97.7°F | Resp 16 | Wt 153.0 lb

## 2014-02-12 DIAGNOSIS — I10 Essential (primary) hypertension: Secondary | ICD-10-CM

## 2014-02-12 DIAGNOSIS — Z0000 Encounter for general adult medical examination without abnormal findings: Secondary | ICD-10-CM

## 2014-02-12 DIAGNOSIS — E785 Hyperlipidemia, unspecified: Secondary | ICD-10-CM

## 2014-02-12 DIAGNOSIS — K5190 Ulcerative colitis, unspecified, without complications: Secondary | ICD-10-CM

## 2014-02-12 DIAGNOSIS — E039 Hypothyroidism, unspecified: Secondary | ICD-10-CM

## 2014-02-12 MED ORDER — METOPROLOL SUCCINATE ER 25 MG PO TB24: 25.0000 mg | ORAL_TABLET | Freq: Every day | ORAL | Status: DC

## 2014-02-12 MED ORDER — EZETIMIBE-SIMVASTATIN 10-20 MG PO TABS: 1.0000 | ORAL_TABLET | Freq: Every day | ORAL | Status: DC

## 2014-02-12 MED ORDER — VERAPAMIL HCL 80 MG PO TABS: 80.0000 mg | ORAL_TABLET | Freq: Two times a day (BID) | ORAL | Status: DC

## 2014-02-12 MED ORDER — SYNTHROID 75 MCG PO TABS: 75.0000 ug | ORAL_TABLET | Freq: Every day | ORAL | Status: DC

## 2014-02-12 MED ORDER — PERINDOPRIL ERBUMINE 8 MG PO TABS: 8.0000 mg | ORAL_TABLET | Freq: Every day | ORAL | Status: DC

## 2014-02-12 MED ORDER — TRIAMCINOLONE ACETONIDE 0.5 % EX OINT: TOPICAL_OINTMENT | Freq: Two times a day (BID) | CUTANEOUS | Status: DC

## 2014-02-12 NOTE — Progress Notes (Signed)
Pre visit review using our clinic review tool, if applicable. No additional management support is needed unless otherwise documented below in the visit note. 

## 2014-02-12 NOTE — Assessment & Plan Note (Signed)
Continue with current prescription therapy as reflected on the Med list. Labs  

## 2014-02-12 NOTE — Assessment & Plan Note (Signed)
Labs

## 2014-02-12 NOTE — Progress Notes (Signed)
   Subjective:   HPI  The patient presents for a follow-up of chronic colitis, hypertension, chronic dyslipidemia and occ stomatitis controlled with medicines. BP is ok at home...    Review of Systems  Constitutional: Negative for chills, activity change, appetite change, fatigue and unexpected weight change.  HENT: Negative for congestion, mouth sores and sinus pressure.   Eyes: Negative for visual disturbance.  Respiratory: Negative for cough and chest tightness.   Gastrointestinal: Negative for nausea and abdominal pain.  Genitourinary: Negative for frequency, difficulty urinating and vaginal pain.  Musculoskeletal: Positive for back pain. Negative for gait problem.  Skin: Negative for pallor and rash.  Neurological: Negative for dizziness, tremors, weakness, numbness and headaches.  Psychiatric/Behavioral: Negative for confusion and sleep disturbance.   Wt Readings from Last 3 Encounters:  02/12/14 153 lb (69.4 kg)  08/01/13 150 lb (68.04 kg)  01/09/13 150 lb (68.04 kg)   BP Readings from Last 3 Encounters:  02/12/14 160/78  08/01/13 182/90  01/09/13 174/86        Objective:   Physical Exam  Constitutional: She appears well-developed and well-nourished. No distress.  HENT:  Head: Normocephalic.  Right Ear: External ear normal.  Left Ear: External ear normal.  Nose: Nose normal.  Mouth/Throat: Oropharynx is clear and moist.  Eyes: Conjunctivae are normal. Pupils are equal, round, and reactive to light. Right eye exhibits no discharge. Left eye exhibits no discharge.  Neck: Normal range of motion. Neck supple. No JVD present. No tracheal deviation present. No thyromegaly present.  Cardiovascular: Normal rate, regular rhythm and normal heart sounds.   Pulmonary/Chest: No stridor. No respiratory distress. She has no wheezes.  Abdominal: Soft. Bowel sounds are normal. She exhibits no distension and no mass. There is no tenderness. There is no rebound and no guarding.   Musculoskeletal: She exhibits no edema and no tenderness.  Lymphadenopathy:    She has no cervical adenopathy.  Neurological: She displays normal reflexes. No cranial nerve deficit. She exhibits normal muscle tone. Coordination normal.  Skin: No rash noted. No erythema.  Psychiatric: She has a normal mood and affect. Her behavior is normal. Judgment and thought content normal.    Lab Results  Component Value Date   WBC 8.1 08/13/2013   HGB 14.4 08/13/2013   HCT 42.7 08/13/2013   PLT 278.0 08/13/2013   GLUCOSE 101* 08/13/2013   CHOL 175 08/13/2013   TRIG 73.0 08/13/2013   HDL 64.90 08/13/2013   LDLCALC 96 08/13/2013   ALT 20 08/13/2013   AST 29 08/13/2013   NA 138 08/13/2013   K 4.1 08/13/2013   CL 104 08/13/2013   CREATININE 0.7 08/13/2013   BUN 13 08/13/2013   CO2 27 08/13/2013   TSH 1.24 08/13/2013   HGBA1C 6.2 08/13/2013         Assessment & Plan:

## 2014-02-12 NOTE — Assessment & Plan Note (Signed)
Chronic  Continue with current prescription therapy as reflected on the Med list.  

## 2014-02-27 ENCOUNTER — Other Ambulatory Visit: Payer: Self-pay | Admitting: Internal Medicine

## 2014-03-02 ENCOUNTER — Other Ambulatory Visit: Payer: Self-pay | Admitting: Internal Medicine

## 2014-04-10 ENCOUNTER — Telehealth: Payer: Self-pay

## 2014-04-10 NOTE — Telephone Encounter (Signed)
Company called stating that they faxed over an order for colostomy supplies in march. RX refaxed on yesterday, they advise to complete and fax back so that pt can get her supplies. Thanks

## 2014-04-11 NOTE — Telephone Encounter (Signed)
Pt called stating [facility] advised her that if they do not hear anything back from the doctor regarding the below order, they are going to cancel her RX.

## 2014-05-28 DIAGNOSIS — Z23 Encounter for immunization: Secondary | ICD-10-CM | POA: Diagnosis not present

## 2014-07-04 DIAGNOSIS — H35361 Drusen (degenerative) of macula, right eye: Secondary | ICD-10-CM | POA: Diagnosis not present

## 2014-07-04 DIAGNOSIS — Z961 Presence of intraocular lens: Secondary | ICD-10-CM | POA: Diagnosis not present

## 2014-07-04 DIAGNOSIS — H40013 Open angle with borderline findings, low risk, bilateral: Secondary | ICD-10-CM | POA: Diagnosis not present

## 2014-07-04 DIAGNOSIS — H35033 Hypertensive retinopathy, bilateral: Secondary | ICD-10-CM | POA: Diagnosis not present

## 2014-07-15 ENCOUNTER — Telehealth: Payer: Self-pay | Admitting: *Deleted

## 2014-07-15 NOTE — Telephone Encounter (Signed)
Left msg on triage stating have appt on 08/21/14 was told to have labs done prior to appt. Checking to see has md entered labs. Inform pt md has already place lab orders...Jasmine Mccormick

## 2014-07-29 ENCOUNTER — Telehealth: Payer: Self-pay | Admitting: Internal Medicine

## 2014-07-29 MED ORDER — ONDANSETRON HCL 4 MG PO TABS: 4.0000 mg | ORAL_TABLET | Freq: Three times a day (TID) | ORAL | Status: DC | PRN

## 2014-07-29 NOTE — Telephone Encounter (Signed)
Ok Zofran - emailed To ER if worse You should have asked me during daytime yesterday - this seems to have been urgent.  Thx

## 2014-07-29 NOTE — Telephone Encounter (Signed)
Pt has been vomiting since 2am, pt has ileostomy and was wondering if Dr Alain Marion would be willing to call something to pharmacy to control/stop vomiting? I scheduled appt for Wed 12/12 with Dr Alain Marion. Pls advise [phone]

## 2014-07-30 ENCOUNTER — Ambulatory Visit: Payer: Medicare Other | Admitting: Internal Medicine

## 2014-07-30 NOTE — Telephone Encounter (Signed)
Pt informed - she states she is better today. She has not vomited since late last night and was able to eat and take a shower this morning. She is going to get Zofran and call us or go to [facility] if vomiting returns.

## 2014-08-13 ENCOUNTER — Ambulatory Visit (INDEPENDENT_AMBULATORY_CARE_PROVIDER_SITE_OTHER): Payer: Medicare Other | Admitting: Internal Medicine

## 2014-08-13 ENCOUNTER — Encounter: Payer: Self-pay | Admitting: Internal Medicine

## 2014-08-13 ENCOUNTER — Ambulatory Visit (INDEPENDENT_AMBULATORY_CARE_PROVIDER_SITE_OTHER)
Admission: RE | Admit: 2014-08-13 | Discharge: 2014-08-13 | Disposition: A | Discharge status: Home / Self Care | Payer: Medicare Other | Source: Ambulatory Visit | Attending: Internal Medicine | Admitting: Internal Medicine

## 2014-08-13 VITALS — BP 180/92 | HR 73 | Temp 97.9°F | Ht 64.25 in | Wt 156.8 lb

## 2014-08-13 DIAGNOSIS — R6884 Jaw pain: Secondary | ICD-10-CM | POA: Diagnosis not present

## 2014-08-13 DIAGNOSIS — I10 Essential (primary) hypertension: Secondary | ICD-10-CM

## 2014-08-13 MED ORDER — GABAPENTIN 100 MG PO CAPS: ORAL_CAPSULE | ORAL | Status: DC

## 2014-08-13 NOTE — Patient Instructions (Addendum)
Your next office appointment will be determined based upon review of your pending labs & x-rays. Those instructions will be transmitted to you by mail Followup as needed for your acute issue. Please report any significant change in your symptoms.  Minimal Blood Pressure Goal= AVERAGE < 140/90;  Ideal is an AVERAGE < 135/85. This AVERAGE should be calculated from @ least 5-7 BP readings taken @ different times of day on different days of week. You should not respond to isolated BP readings , but rather the AVERAGE for that week .Please BRING your  blood pressure cuff to all office visits to verify that it is reliable.It  can also be checked against the blood pressure device at the pharmacy. Finger or wrist cuffs are not dependable; an arm cuff is.

## 2014-08-13 NOTE — Progress Notes (Signed)
Pre visit review using our clinic review tool, if applicable. No additional management support is needed unless otherwise documented below in the visit note. 

## 2014-08-13 NOTE — Progress Notes (Signed)
   Subjective:    Patient ID: Jasmine Mccormick, female    DOB: 23-Dec-1933, 80 y.o.   MRN: [id number]  HPI  She's been having pain above the right upper lip into the right maxillary sinus area for 3-4 weeks. Initially this was intermittent ;but it has  Progressed & is now " severe".  Her Dentist did dental films & prescribed Z-Pak on 12/22 .This has been of no benefit.  She describes minor congestion in the right nare upon awakening but no other upper respiratory tract symptoms. She has no chest symptoms either.  She states BP is "always high in office "; but it is controlled @ home.  Review of Systems Frontal headache, nasal purulence, dental pain, sore throat , otic pain or otic discharge denied. No fever , chills or sweats. Extrinsic symptoms of itchy, watery eyes, sneezing, or angioedema are denied. There is no significant cough, sputum production, wheezing,or  paroxysmal nocturnal dyspnea.     Objective:   Physical Exam Positive or pertinent findings include: There is marked accentuated curvature of the upper thoracic spine.  General appearance:good health ;well nourished; no acute distress or increased work of breathing is present.  No  lymphadenopathy about the head, neck, or axilla noted.  Eyes: No conjunctival inflammation or lid edema is present. There is no scleral icterus. Ears:  External ear exam shows no significant lesions or deformities.  Otoscopic examination reveals clear canals, tympanic membranes are intact bilaterally without bulging, retraction, inflammation or discharge. Nose:  External nasal examination shows no deformity or inflammation. Nasal mucosa are pink and moist without lesions or exudates. No septal dislocation or deviation.No obstruction to airflow.  Oral exam: Dental hygiene is good; lips and gums are healthy appearing.There is no oropharyngeal erythema or exudate noted.  Neck:  No deformities, thyromegaly, masses, or tenderness noted.   Supple with  full range of motion without pain.  Heart:  Normal rate and regular rhythm. S1 and S2 normal without gallop, murmur, click, rub or other extra sounds.  Lungs:Chest clear to auscultation; no wheezes, rhonchi,rales ,or rubs present.No increased work of breathing.   Extremities:  No cyanosis, edema, or clubbing  noted  Skin: Warm & dry w/o jaundice or tenting.        Assessment & Plan:  #1 R maxiilary pain; R/O sinusitis vs facial neuralgia #2 HTN; "White Coat Syndrome " claimed as cause. Home readings & cuff requested @  [facility]. See orders

## 2014-08-14 ENCOUNTER — Other Ambulatory Visit (INDEPENDENT_AMBULATORY_CARE_PROVIDER_SITE_OTHER): Payer: Medicare Other

## 2014-08-14 DIAGNOSIS — E785 Hyperlipidemia, unspecified: Secondary | ICD-10-CM

## 2014-08-14 DIAGNOSIS — E039 Hypothyroidism, unspecified: Secondary | ICD-10-CM | POA: Diagnosis not present

## 2014-08-14 DIAGNOSIS — K5190 Ulcerative colitis, unspecified, without complications: Secondary | ICD-10-CM

## 2014-08-14 DIAGNOSIS — Z0000 Encounter for general adult medical examination without abnormal findings: Secondary | ICD-10-CM | POA: Diagnosis not present

## 2014-08-14 DIAGNOSIS — I10 Essential (primary) hypertension: Secondary | ICD-10-CM

## 2014-08-14 LAB — HEPATIC FUNCTION PANEL
ALT: 23 U/L (ref 0–35)
AST: 28 U/L (ref 0–37)
Albumin: 3.9 g/dL (ref 3.5–5.2)
Alkaline Phosphatase: 72 U/L (ref 39–117)
BILIRUBIN DIRECT: 0.1 mg/dL (ref 0.0–0.3)
Total Bilirubin: 0.8 mg/dL (ref 0.2–1.2)
Total Protein: 6.5 g/dL (ref 6.0–8.3)

## 2014-08-14 LAB — URINALYSIS
BILIRUBIN URINE: NEGATIVE
Hgb urine dipstick: NEGATIVE
KETONES UR: NEGATIVE
LEUKOCYTES UA: NEGATIVE
Nitrite: NEGATIVE
Specific Gravity, Urine: 1.01 (ref 1.000–1.030)
Total Protein, Urine: NEGATIVE
Urine Glucose: NEGATIVE
Urobilinogen, UA: 0.2 (ref 0.0–1.0)
pH: 7 (ref 5.0–8.0)

## 2014-08-14 LAB — BASIC METABOLIC PANEL
BUN: 15 mg/dL (ref 6–23)
CO2: 27 mEq/L (ref 19–32)
Calcium: 9.3 mg/dL (ref 8.4–10.5)
Chloride: 104 mEq/L (ref 96–112)
Creatinine, Ser: 0.7 mg/dL (ref 0.4–1.2)
GFR: 83.22 mL/min (ref >60.00)
GLUCOSE: 101 mg/dL — AB (ref 70–99)
POTASSIUM: 4.7 meq/L (ref 3.5–5.1)
Sodium: 137 mEq/L (ref 135–145)

## 2014-08-14 LAB — CBC WITH DIFFERENTIAL/PLATELET
BASOS PCT: 0.5 % (ref 0.0–3.0)
Basophils Absolute: 0 10*3/uL (ref 0.0–0.1)
EOS ABS: 0.2 10*3/uL (ref 0.0–0.7)
EOS PCT: 2.4 % (ref 0.0–5.0)
HEMATOCRIT: 44.1 % (ref 36.0–46.0)
Hemoglobin: 14.6 g/dL (ref 12.0–15.0)
LYMPHS ABS: 1.4 10*3/uL (ref 0.7–4.0)
Lymphocytes Relative: 21.9 % (ref 12.0–46.0)
MCHC: 33 g/dL (ref 30.0–36.0)
MCV: 90.8 fl (ref 78.0–100.0)
Monocytes Absolute: 0.6 10*3/uL (ref 0.1–1.0)
Monocytes Relative: 9.8 % (ref 3.0–12.0)
Neutro Abs: 4.2 10*3/uL (ref 1.4–7.7)
Neutrophils Relative %: 65.4 % (ref 43.0–77.0)
PLATELETS: 269 10*3/uL (ref 150.0–400.0)
RBC: 4.86 Mil/uL (ref 3.87–5.11)
RDW: 13.3 % (ref 11.5–15.5)
WBC: 6.4 10*3/uL (ref 4.0–10.5)

## 2014-08-14 LAB — LIPID PANEL
CHOLESTEROL: 184 mg/dL (ref 0–200)
HDL: 60.9 mg/dL (ref >39.00)
LDL CALC: 106 mg/dL — AB (ref 0–99)
NonHDL: 123.1
TRIGLYCERIDES: 88 mg/dL (ref 0.0–149.0)
Total CHOL/HDL Ratio: 3
VLDL: 17.6 mg/dL (ref 0.0–40.0)

## 2014-08-14 LAB — TSH: TSH: 2.63 u[IU]/mL (ref 0.35–4.50)

## 2014-08-21 ENCOUNTER — Ambulatory Visit (INDEPENDENT_AMBULATORY_CARE_PROVIDER_SITE_OTHER): Payer: Medicare Other | Admitting: Internal Medicine

## 2014-08-21 ENCOUNTER — Encounter: Payer: Self-pay | Admitting: Internal Medicine

## 2014-08-21 VITALS — BP 180/90 | HR 72 | Temp 97.6°F | Wt 157.0 lb

## 2014-08-21 DIAGNOSIS — I10 Essential (primary) hypertension: Secondary | ICD-10-CM

## 2014-08-21 DIAGNOSIS — R51 Headache: Secondary | ICD-10-CM | POA: Diagnosis not present

## 2014-08-21 DIAGNOSIS — R519 Headache, unspecified: Secondary | ICD-10-CM

## 2014-08-21 DIAGNOSIS — L570 Actinic keratosis: Secondary | ICD-10-CM

## 2014-08-21 MED ORDER — METOPROLOL SUCCINATE ER 25 MG PO TB24: 25.0000 mg | ORAL_TABLET | Freq: Every day | ORAL | Status: DC

## 2014-08-21 MED ORDER — EZETIMIBE-SIMVASTATIN 10-20 MG PO TABS: 1.0000 | ORAL_TABLET | Freq: Every day | ORAL | Status: DC

## 2014-08-21 MED ORDER — PERINDOPRIL ERBUMINE 8 MG PO TABS: 8.0000 mg | ORAL_TABLET | Freq: Every day | ORAL | Status: DC

## 2014-08-21 MED ORDER — SYNTHROID 75 MCG PO TABS: 75.0000 ug | ORAL_TABLET | Freq: Every day | ORAL | Status: DC

## 2014-08-21 MED ORDER — VERAPAMIL HCL 80 MG PO TABS: 80.0000 mg | ORAL_TABLET | Freq: Two times a day (BID) | ORAL | Status: DC

## 2014-08-21 NOTE — Progress Notes (Signed)
Pre visit review using our clinic review tool, if applicable. No additional management support is needed unless otherwise documented below in the visit note. 

## 2014-08-21 NOTE — Assessment & Plan Note (Signed)
Take Aleve daily and Gabapentin until face pain has stopped

## 2014-08-21 NOTE — Patient Instructions (Addendum)
Take Aleve daily and Gabapentin until face pain has stopped   Tolerated well. Complications none.   Postprocedure instructions :     Keep the wounds clean. You can wash them with liquid soap and water. Pat dry with gauze or a Kleenex tissue  Before applying antibiotic ointment and a Band-Aid.   You need to report immediately  if  any signs of infection develop.

## 2014-08-21 NOTE — Assessment & Plan Note (Signed)
Continue with current prescription therapy as reflected on the Med list.  

## 2014-08-21 NOTE — Progress Notes (Signed)
   Subjective:   HPI  F/u R maxilla pain x 2-3 wks, resolving on Gabapentin  The patient presents for a follow-up of chronic colitis, hypertension, chronic dyslipidemia and occ stomatitis controlled with medicines. BP is ok at home...  C/o skin lesions   Review of Systems  Constitutional: Negative for chills, activity change, appetite change, fatigue and unexpected weight change.  HENT: Negative for congestion, mouth sores and sinus pressure.   Eyes: Negative for visual disturbance.  Respiratory: Negative for cough and chest tightness.   Gastrointestinal: Negative for nausea and abdominal pain.  Genitourinary: Negative for frequency, difficulty urinating and vaginal pain.  Musculoskeletal: Positive for back pain. Negative for gait problem.  Skin: Negative for pallor and rash.  Neurological: Negative for dizziness, tremors, weakness, numbness and headaches.  Psychiatric/Behavioral: Negative for confusion and sleep disturbance.   Wt Readings from Last 3 Encounters:  08/21/14 157 lb (71.215 kg)  08/13/14 156 lb 12 oz (71.101 kg)  02/12/14 153 lb (69.4 kg)   BP Readings from Last 3 Encounters:  08/21/14 180/90  08/13/14 180/92  02/12/14 160/78    Procedure Note :    Procedure : Cryosurgery   Indication:   Actinic keratosis(es)   Risks including unsuccessful procedure , bleeding, infection, bruising, scar, a need for a repeat  procedure and others were explained to the patient in detail as well as the benefits. Informed consent was obtained verbally.    5 lesion(s)  on face    was/were treated with liquid nitrogen on a Q-tip in a usual fasion . Band-Aid was applied and antibiotic ointment was given for a later use.   Tolerated well. Complications none.        Objective:   Physical Exam  Lab Results  Component Value Date   WBC 6.4 08/14/2014   HGB 14.6 08/14/2014   HCT 44.1 08/14/2014   PLT 269.0 08/14/2014   GLUCOSE 101* 08/14/2014   CHOL 184 08/14/2014   TRIG  88.0 08/14/2014   HDL 60.90 08/14/2014   LDLCALC 106* 08/14/2014   ALT 23 08/14/2014   AST 28 08/14/2014   NA 137 08/14/2014   K 4.7 08/14/2014   CL 104 08/14/2014   CREATININE 0.7 08/14/2014   BUN 15 08/14/2014   CO2 27 08/14/2014   TSH 2.63 08/14/2014   HGBA1C 6.2 08/13/2013         Assessment & Plan:

## 2014-08-22 ENCOUNTER — Telehealth: Payer: Self-pay | Admitting: Internal Medicine

## 2014-08-22 NOTE — Telephone Encounter (Signed)
emmi mailed  °

## 2014-11-05 DIAGNOSIS — H40013 Open angle with borderline findings, low risk, bilateral: Secondary | ICD-10-CM | POA: Diagnosis not present

## 2015-02-10 ENCOUNTER — Ambulatory Visit: Payer: Medicare Other | Admitting: Internal Medicine

## 2015-03-03 ENCOUNTER — Encounter: Payer: Self-pay | Admitting: Internal Medicine

## 2015-03-03 ENCOUNTER — Ambulatory Visit (INDEPENDENT_AMBULATORY_CARE_PROVIDER_SITE_OTHER): Payer: Medicare Other | Admitting: Internal Medicine

## 2015-03-03 VITALS — BP 160/85 | HR 80 | Wt 155.0 lb

## 2015-03-03 DIAGNOSIS — E038 Other specified hypothyroidism: Secondary | ICD-10-CM

## 2015-03-03 DIAGNOSIS — E538 Deficiency of other specified B group vitamins: Secondary | ICD-10-CM | POA: Diagnosis not present

## 2015-03-03 DIAGNOSIS — K51918 Ulcerative colitis, unspecified with other complication: Secondary | ICD-10-CM | POA: Diagnosis not present

## 2015-03-03 DIAGNOSIS — E559 Vitamin D deficiency, unspecified: Secondary | ICD-10-CM

## 2015-03-03 DIAGNOSIS — L570 Actinic keratosis: Secondary | ICD-10-CM

## 2015-03-03 DIAGNOSIS — I10 Essential (primary) hypertension: Secondary | ICD-10-CM | POA: Diagnosis not present

## 2015-03-03 DIAGNOSIS — E034 Atrophy of thyroid (acquired): Secondary | ICD-10-CM

## 2015-03-03 MED ORDER — PERINDOPRIL ERBUMINE 8 MG PO TABS: 8.0000 mg | ORAL_TABLET | Freq: Every day | ORAL | Status: DC

## 2015-03-03 MED ORDER — VERAPAMIL HCL 80 MG PO TABS: 80.0000 mg | ORAL_TABLET | Freq: Two times a day (BID) | ORAL | Status: DC

## 2015-03-03 MED ORDER — SYNTHROID 75 MCG PO TABS: 75.0000 ug | ORAL_TABLET | Freq: Every day | ORAL | Status: DC

## 2015-03-03 MED ORDER — METOPROLOL SUCCINATE ER 25 MG PO TB24: 25.0000 mg | ORAL_TABLET | Freq: Every day | ORAL | Status: DC

## 2015-03-03 MED ORDER — EZETIMIBE-SIMVASTATIN 10-20 MG PO TABS: 1.0000 | ORAL_TABLET | Freq: Every day | ORAL | Status: DC

## 2015-03-03 NOTE — Assessment & Plan Note (Signed)
Labs On Levothroid 

## 2015-03-03 NOTE — Assessment & Plan Note (Signed)
Chronic Metoprolol BP is ok at home - always nl

## 2015-03-03 NOTE — Patient Instructions (Signed)
   Postprocedure instructions :     Keep the wounds clean. You can wash them with liquid soap and water. Pat dry with gauze or a Kleenex tissue  Before applying antibiotic ointment and a Band-Aid.   You need to report immediately  if  any signs of infection develop.    

## 2015-03-03 NOTE — Progress Notes (Signed)
Subjective:  Patient ID: Jasmine Mccormick, female    DOB: [birth year]  Age: 81 y.o. MRN: [id number]  CC: No chief complaint on file.   HPI REY FORS presents for HTN  Outpatient Prescriptions Prior to Visit  Medication Sig Dispense Refill  . Calcium Carbonate-Vitamin D 600-400 MG-UNIT per tablet Take 1 tablet by mouth 2 (two) times daily.      . Cholecalciferol 1000 UNITS tablet Take 1,000 Units by mouth daily.      [name] ezetimibe-simvastatin (VYTORIN) 10-20 MG per tablet Take 1 tablet by mouth at bedtime. 90 tablet 3  . gabapentin (NEURONTIN) 100 MG capsule One pill every eight hours as needed; dose may be increased by one pill each dose after 72 hours if only partially effective (Patient not taking: Reported on 03/03/2015) 30 capsule 2  . metoprolol succinate (TOPROL-XL) 25 MG 24 hr tablet Take 1 tablet (25 mg total) by mouth daily. At night 90 tablet 3  . Multiple Vitamins-Minerals (MULTIVITAMIN,TX-MINERALS) tablet Take 1 tablet by mouth daily.      . perindopril (ACEON) 8 MG tablet Take 1 tablet (8 mg total) by mouth daily. 90 tablet 3  . SYNTHROID 75 MCG tablet Take 1 tablet (75 mcg total) by mouth daily. 90 tablet 3  . verapamil (CALAN) 80 MG tablet Take 1 tablet (80 mg total) by mouth 2 (two) times daily. 180 tablet 3   No facility-administered medications prior to visit.    ROS Review of Systems  Constitutional: Positive for fatigue. Negative for chills, activity change, appetite change and unexpected weight change.  HENT: Negative for congestion, mouth sores and sinus pressure.   Eyes: Negative for visual disturbance.  Respiratory: Negative for cough and chest tightness.   Gastrointestinal: Negative for nausea and abdominal pain.  Genitourinary: Negative for frequency, difficulty urinating and vaginal pain.  Musculoskeletal: Positive for arthralgias. Negative for back pain and gait problem.  Skin: Negative for pallor and rash.  Neurological: Negative for dizziness,  tremors, weakness, numbness and headaches.  Psychiatric/Behavioral: Negative for confusion and sleep disturbance. The patient is nervous/anxious.     Objective:  BP 210/78 mmHg  Pulse 87  Wt 155 lb (70.308 kg)  SpO2 99%  BP Readings from Last 3 Encounters:  03/03/15 210/78  08/21/14 180/90  08/13/14 180/92    Wt Readings from Last 3 Encounters:  03/03/15 155 lb (70.308 kg)  08/21/14 157 lb (71.215 kg)  08/13/14 156 lb 12 oz (71.101 kg)    Physical Exam  Constitutional: She appears well-developed. No distress.  HENT:  Head: Normocephalic.  Right Ear: External ear normal.  Left Ear: External ear normal.  Nose: Nose normal.  Mouth/Throat: Oropharynx is clear and moist.  Eyes: Conjunctivae are normal. Pupils are equal, round, and reactive to light. Right eye exhibits no discharge. Left eye exhibits no discharge.  Neck: Normal range of motion. Neck supple. No JVD present. No tracheal deviation present. No thyromegaly present.  Cardiovascular: Normal rate, regular rhythm and normal heart sounds.   Pulmonary/Chest: No stridor. No respiratory distress. She has no wheezes.  Abdominal: Soft. Bowel sounds are normal. She exhibits no distension and no mass. There is no tenderness. There is no rebound and no guarding.  Musculoskeletal: She exhibits no edema or tenderness.  Lymphadenopathy:    She has no cervical adenopathy.  Neurological: She displays normal reflexes. No cranial nerve deficit. She exhibits normal muscle tone. Coordination normal.  Skin: No rash noted. No erythema.  Psychiatric: Her behavior is normal.  Judgment and thought content normal.  Stoma Sad AKs on face      Procedure Note :     Procedure : Cryosurgery   Indication:   Actinic keratosis(es) x3 face   Risks including unsuccessful procedure , bleeding, infection, bruising, scar, a need for a repeat  procedure and others were explained to the patient in detail as well as the benefits. Informed consent was  obtained verbally.   3  lesion(s)  on  face  was/were treated with liquid nitrogen on a Q-tip in a usual fasion . Band-Aid was applied and antibiotic ointment was given for a later use.   Tolerated well. Complications none.   Postprocedure instructions :     Keep the wounds clean. You can wash them with liquid soap and water. Pat dry with gauze or a Kleenex tissue  Before applying antibiotic ointment and a Band-Aid.   You need to report immediately  if  any signs of infection develop.    Lab Results  Component Value Date   WBC 6.4 08/14/2014   HGB 14.6 08/14/2014   HCT 44.1 08/14/2014   PLT 269.0 08/14/2014   GLUCOSE 101* 08/14/2014   CHOL 184 08/14/2014   TRIG 88.0 08/14/2014   HDL 60.90 08/14/2014   LDLCALC 106* 08/14/2014   ALT 23 08/14/2014   AST 28 08/14/2014   NA 137 08/14/2014   K 4.7 08/14/2014   CL 104 08/14/2014   CREATININE 0.7 08/14/2014   BUN 15 08/14/2014   CO2 27 08/14/2014   TSH 2.63 08/14/2014   HGBA1C 6.2 08/13/2013    Dg Sinus 1-2 Views  08/13/2014   CLINICAL DATA:  Right-sided maxillary pain for the past 4 weeks.  EXAM: PARANASAL SINUSES - 1-2 VIEW  COMPARISON:  No priors.  FINDINGS: Paranasal sinuses appear well aerated, without definite air-fluid levels.  IMPRESSION: 1. No plain film evidence to suggest acute sinusitis at this time.   Electronically Signed   By: Vinnie Langton M.D.   On: 08/13/2014 18:17    Assessment & Plan:   There are no diagnoses linked to this encounter. I am having Ms. Malak maintain her Calcium Carbonate-Vitamin D, (multivitamin,tx-minerals), Cholecalciferol, gabapentin, ezetimibe-simvastatin, metoprolol succinate, perindopril, SYNTHROID, and verapamil.  No orders of the defined types were placed in this encounter.     Follow-up: No Follow-up on file.  Walker Kehr, MD

## 2015-03-03 NOTE — Assessment & Plan Note (Signed)
See procedure 

## 2015-03-03 NOTE — Progress Notes (Signed)
Pre visit review using our clinic review tool, if applicable. No additional management support is needed unless otherwise documented below in the visit note. 

## 2015-03-03 NOTE — Assessment & Plan Note (Signed)
Labs

## 2015-03-06 ENCOUNTER — Ambulatory Visit (INDEPENDENT_AMBULATORY_CARE_PROVIDER_SITE_OTHER): Payer: Medicare Other | Admitting: Family Medicine

## 2015-03-06 VITALS — BP 200/90 | HR 73 | Temp 98.7°F | Resp 18 | Ht 65.0 in | Wt 154.0 lb

## 2015-03-06 DIAGNOSIS — I10 Essential (primary) hypertension: Secondary | ICD-10-CM

## 2015-03-06 DIAGNOSIS — J029 Acute pharyngitis, unspecified: Secondary | ICD-10-CM | POA: Diagnosis not present

## 2015-03-06 LAB — POCT RAPID STREP A (OFFICE): RAPID STREP A SCREEN: NEGATIVE

## 2015-03-06 NOTE — Patient Instructions (Addendum)
Advise that you go into a pharmacy or somewhere and check your blood pressure every couple of weeks. Right the numbers down. If they continue to run above about 160/90, you should go back and speak to your primary care physician about this.  The strep test is negative. You probably just have a little postnasal drainage causing irritation of the right side of the throat, and that is probably what use all. Drink plenty of fluids and to give it a few days. If the cultures shows anything else will contact you, but I do not believe any antibodies are indicated at this time. Let us know if you start running a fever.

## 2015-03-06 NOTE — Progress Notes (Signed)
  Subjective:  Patient ID: Jasmine Mccormick, female    DOB: 04-Apr-1934  Age: 81 y.o. MRN: [id number]  81 year old lady who is here with a right sided sore throat. She looked in there this morning and noticed some white pus pain down. She has had a little soreness there the last couple of days. She had seen her primary care physician, Dr. Renato Battles cough, early this week. She was not able to get back into him today, and her ear nose and throat doctor was scheduled out several weeks. She came on in here to get checked. She does not smoke.  She is generally healthy. Has a history of also: Deirdre Evener had to have a colectomy for that. She has high blood pressure, and "it is always high when she goes into the physician office." She apparently does not check it of the places often, because she says she gets excited then it goes up and she worries more about it then. She is on blood pressure medication. Objective:   Pleasant healthy-appearing lady in no major distress. There are some red spots on her face where she just had lesions frozen off. Her throat is mildly erythematous on the right side of the pharynx. No pus could be seen at this time. Neck was supple without nodes. Screen and culture taken.  Assessment & Plan:   Assessment: Pharyngitis, undetermined type Hypertension  Plan: Strep test and culture if needed Patient Instructions  Advise that you go into a pharmacy or somewhere and check your blood pressure every couple of weeks. Right the numbers down. If they continue to run above about 160/90, you should go back and speak to your primary care physician about this.  The strep test is negative. You probably just have a little postnasal drainage causing irritation of the right side of the throat, and that is probably what use all. Drink plenty of fluids and to give it a few days. If the cultures shows anything else will contact you, but I do not believe any antibodies are indicated at this time. Let  us know if you start running a fever.   We talked for a follow-up again about her blood pressure. She is going to get followed up on if it continues to run high. I told her that her physician did have access to these records. If the throat gets worse she is to call me back.  Ruby Dilone, MD 03/06/2015

## 2015-03-08 LAB — CULTURE, GROUP A STREP: ORGANISM ID, BACTERIA: NORMAL

## 2015-05-22 ENCOUNTER — Other Ambulatory Visit: Payer: Self-pay

## 2015-05-22 DIAGNOSIS — Z1231 Encounter for screening mammogram for malignant neoplasm of breast: Secondary | ICD-10-CM

## 2015-06-04 DIAGNOSIS — Z23 Encounter for immunization: Secondary | ICD-10-CM | POA: Diagnosis not present

## 2015-06-18 ENCOUNTER — Ambulatory Visit
Admission: RE | Admit: 2015-06-18 | Discharge: 2015-06-18 | Disposition: A | Discharge status: Home / Self Care | Payer: Medicare Other | Source: Ambulatory Visit

## 2015-06-18 DIAGNOSIS — Z1231 Encounter for screening mammogram for malignant neoplasm of breast: Secondary | ICD-10-CM | POA: Diagnosis not present

## 2015-07-15 DIAGNOSIS — H40013 Open angle with borderline findings, low risk, bilateral: Secondary | ICD-10-CM | POA: Diagnosis not present

## 2015-07-15 DIAGNOSIS — H31011 Macula scars of posterior pole (postinflammatory) (post-traumatic), right eye: Secondary | ICD-10-CM | POA: Diagnosis not present

## 2015-07-15 DIAGNOSIS — H02839 Dermatochalasis of unspecified eye, unspecified eyelid: Secondary | ICD-10-CM | POA: Diagnosis not present

## 2015-07-15 DIAGNOSIS — H35361 Drusen (degenerative) of macula, right eye: Secondary | ICD-10-CM | POA: Diagnosis not present

## 2015-08-27 ENCOUNTER — Other Ambulatory Visit (INDEPENDENT_AMBULATORY_CARE_PROVIDER_SITE_OTHER): Payer: Medicare Other

## 2015-08-27 DIAGNOSIS — Z79899 Other long term (current) drug therapy: Secondary | ICD-10-CM | POA: Diagnosis not present

## 2015-08-27 DIAGNOSIS — M810 Age-related osteoporosis without current pathological fracture: Secondary | ICD-10-CM | POA: Diagnosis not present

## 2015-08-27 DIAGNOSIS — K51918 Ulcerative colitis, unspecified with other complication: Secondary | ICD-10-CM

## 2015-08-27 DIAGNOSIS — E559 Vitamin D deficiency, unspecified: Secondary | ICD-10-CM

## 2015-08-27 DIAGNOSIS — L570 Actinic keratosis: Secondary | ICD-10-CM | POA: Diagnosis not present

## 2015-08-27 DIAGNOSIS — I10 Essential (primary) hypertension: Secondary | ICD-10-CM

## 2015-08-27 DIAGNOSIS — E538 Deficiency of other specified B group vitamins: Secondary | ICD-10-CM

## 2015-08-27 DIAGNOSIS — E038 Other specified hypothyroidism: Secondary | ICD-10-CM

## 2015-08-27 DIAGNOSIS — E034 Atrophy of thyroid (acquired): Secondary | ICD-10-CM

## 2015-08-27 LAB — URINALYSIS
Bilirubin Urine: NEGATIVE
HGB URINE DIPSTICK: NEGATIVE
Ketones, ur: NEGATIVE
LEUKOCYTES UA: NEGATIVE
Nitrite: NEGATIVE
SPECIFIC GRAVITY, URINE: 1.01 (ref 1.000–1.030)
Total Protein, Urine: NEGATIVE
UROBILINOGEN UA: 0.2 (ref 0.0–1.0)
Urine Glucose: NEGATIVE
pH: 6.5 (ref 5.0–8.0)

## 2015-08-27 LAB — CBC WITH DIFFERENTIAL/PLATELET
BASOS PCT: 0.5 % (ref 0.0–3.0)
Basophils Absolute: 0 10*3/uL (ref 0.0–0.1)
EOS PCT: 0.9 % (ref 0.0–5.0)
Eosinophils Absolute: 0.1 10*3/uL (ref 0.0–0.7)
HCT: 45.5 % (ref 36.0–46.0)
HEMOGLOBIN: 15.1 g/dL — AB (ref 12.0–15.0)
LYMPHS PCT: 19.4 % (ref 12.0–46.0)
Lymphs Abs: 1.2 10*3/uL (ref 0.7–4.0)
MCHC: 33.2 g/dL (ref 30.0–36.0)
MCV: 90.4 fl (ref 78.0–100.0)
MONOS PCT: 10.5 % (ref 3.0–12.0)
Monocytes Absolute: 0.6 10*3/uL (ref 0.1–1.0)
Neutro Abs: 4.1 10*3/uL (ref 1.4–7.7)
Neutrophils Relative %: 68.7 % (ref 43.0–77.0)
Platelets: 269 10*3/uL (ref 150.0–400.0)
RBC: 5.03 Mil/uL (ref 3.87–5.11)
RDW: 13.3 % (ref 11.5–15.5)
WBC: 6 10*3/uL (ref 4.0–10.5)

## 2015-08-27 LAB — LIPID PANEL
CHOLESTEROL: 171 mg/dL (ref 0–200)
HDL: 67.7 mg/dL (ref >39.00)
LDL CALC: 90 mg/dL (ref 0–99)
NONHDL: 103.69
Total CHOL/HDL Ratio: 3
Triglycerides: 68 mg/dL (ref 0.0–149.0)
VLDL: 13.6 mg/dL (ref 0.0–40.0)

## 2015-08-27 LAB — BASIC METABOLIC PANEL
BUN: 15 mg/dL (ref 6–23)
CO2: 28 meq/L (ref 19–32)
Calcium: 9.9 mg/dL (ref 8.4–10.5)
Chloride: 103 mEq/L (ref 96–112)
Creatinine, Ser: 0.78 mg/dL (ref 0.40–1.20)
GFR: 75.67 mL/min (ref >60.00)
GLUCOSE: 115 mg/dL — AB (ref 70–99)
Potassium: 4.7 mEq/L (ref 3.5–5.1)
SODIUM: 138 meq/L (ref 135–145)

## 2015-08-27 LAB — HEPATIC FUNCTION PANEL
ALBUMIN: 4.1 g/dL (ref 3.5–5.2)
ALT: 16 U/L (ref 0–35)
AST: 25 U/L (ref 0–37)
Alkaline Phosphatase: 74 U/L (ref 39–117)
BILIRUBIN TOTAL: 0.5 mg/dL (ref 0.2–1.2)
Bilirubin, Direct: 0.1 mg/dL (ref 0.0–0.3)
Total Protein: 6.6 g/dL (ref 6.0–8.3)

## 2015-08-27 LAB — VITAMIN D 25 HYDROXY (VIT D DEFICIENCY, FRACTURES): VITD: 41.17 ng/mL (ref 30.00–100.00)

## 2015-08-27 LAB — VITAMIN B12: Vitamin B-12: 262 pg/mL (ref 211–911)

## 2015-08-27 LAB — TSH: TSH: 0.25 u[IU]/mL — AB (ref 0.35–4.50)

## 2015-09-03 ENCOUNTER — Ambulatory Visit (INDEPENDENT_AMBULATORY_CARE_PROVIDER_SITE_OTHER): Payer: Medicare Other | Admitting: Internal Medicine

## 2015-09-03 ENCOUNTER — Encounter: Payer: Self-pay | Admitting: Internal Medicine

## 2015-09-03 VITALS — BP 184/80 | HR 82 | Ht 65.0 in | Wt 155.0 lb

## 2015-09-03 DIAGNOSIS — H918X9 Other specified hearing loss, unspecified ear: Secondary | ICD-10-CM

## 2015-09-03 DIAGNOSIS — I10 Essential (primary) hypertension: Secondary | ICD-10-CM | POA: Diagnosis not present

## 2015-09-03 DIAGNOSIS — E538 Deficiency of other specified B group vitamins: Secondary | ICD-10-CM

## 2015-09-03 DIAGNOSIS — E039 Hypothyroidism, unspecified: Secondary | ICD-10-CM

## 2015-09-03 DIAGNOSIS — L84 Corns and callosities: Secondary | ICD-10-CM

## 2015-09-03 DIAGNOSIS — Z0000 Encounter for general adult medical examination without abnormal findings: Secondary | ICD-10-CM | POA: Diagnosis not present

## 2015-09-03 DIAGNOSIS — H6120 Impacted cerumen, unspecified ear: Secondary | ICD-10-CM

## 2015-09-03 DIAGNOSIS — E034 Atrophy of thyroid (acquired): Secondary | ICD-10-CM

## 2015-09-03 DIAGNOSIS — H6123 Impacted cerumen, bilateral: Secondary | ICD-10-CM | POA: Diagnosis not present

## 2015-09-03 MED ORDER — SYNTHROID 75 MCG PO TABS: 75.0000 ug | ORAL_TABLET | Freq: Every day | ORAL | Status: DC

## 2015-09-03 MED ORDER — METOPROLOL SUCCINATE ER 25 MG PO TB24: 25.0000 mg | ORAL_TABLET | Freq: Every day | ORAL | Status: DC

## 2015-09-03 MED ORDER — PERINDOPRIL ERBUMINE 8 MG PO TABS: 8.0000 mg | ORAL_TABLET | Freq: Every day | ORAL | Status: DC

## 2015-09-03 MED ORDER — EZETIMIBE-SIMVASTATIN 10-20 MG PO TABS: 1.0000 | ORAL_TABLET | Freq: Every day | ORAL | Status: DC

## 2015-09-03 MED ORDER — VITAMIN B-12 500 MCG SL SUBL: SUBLINGUAL_TABLET | SUBLINGUAL | Status: DC

## 2015-09-03 MED ORDER — VERAPAMIL HCL 80 MG PO TABS: 80.0000 mg | ORAL_TABLET | Freq: Two times a day (BID) | ORAL | Status: DC

## 2015-09-03 NOTE — Assessment & Plan Note (Signed)
Start SL B12 500 mcg/d

## 2015-09-03 NOTE — Progress Notes (Signed)
Pre visit review using our clinic review tool, if applicable. No additional management support is needed unless otherwise documented below in the visit note. 

## 2015-09-03 NOTE — Assessment & Plan Note (Signed)
TSH, FT3 in 6-8 weeks

## 2015-09-03 NOTE — Assessment & Plan Note (Signed)
Metoprolol Labs

## 2015-09-03 NOTE — Assessment & Plan Note (Signed)
1/17 left 5th digit lat aspect callus See procedure

## 2015-09-03 NOTE — Progress Notes (Signed)
Subjective:  Patient ID: Jasmine Mccormick, female    DOB: 1934-04-27  Age: 82 y.o. MRN: [id number]  CC: No chief complaint on file.   HPI [name] presents for a well exam. F/u dyslipidemia, HTN, neuropathy. C/o R little toe pain off and on  Outpatient Prescriptions Prior to Visit  Medication Sig Dispense Refill  . Calcium Carbonate-Vitamin D 600-400 MG-UNIT per tablet Take 1 tablet by mouth 2 (two) times daily.      . Cholecalciferol 1000 UNITS tablet Take 1,000 Units by mouth daily.      [name] gabapentin (NEURONTIN) 100 MG capsule One pill every eight hours as needed; dose may be increased by one pill each dose after 72 hours if only partially effective 30 capsule 2  . Multiple Vitamins-Minerals (MULTIVITAMIN,TX-MINERALS) tablet Take 1 tablet by mouth daily.      [name] ezetimibe-simvastatin (VYTORIN) 10-20 MG per tablet Take 1 tablet by mouth at bedtime. 90 tablet 3  . metoprolol succinate (TOPROL-XL) 25 MG 24 hr tablet Take 1 tablet (25 mg total) by mouth daily. At night 90 tablet 3  . perindopril (ACEON) 8 MG tablet Take 1 tablet (8 mg total) by mouth daily. 90 tablet 3  . SYNTHROID 75 MCG tablet Take 1 tablet (75 mcg total) by mouth daily. 90 tablet 3  . verapamil (CALAN) 80 MG tablet Take 1 tablet (80 mg total) by mouth 2 (two) times daily. 180 tablet 3   No facility-administered medications prior to visit.    ROS Review of Systems  Constitutional: Negative for chills, activity change, appetite change, fatigue and unexpected weight change.  HENT: Negative for congestion, mouth sores and sinus pressure.   Eyes: Negative for visual disturbance.  Respiratory: Negative for cough and chest tightness.   Gastrointestinal: Negative for nausea and abdominal pain.  Genitourinary: Negative for frequency, difficulty urinating and vaginal pain.  Musculoskeletal: Negative for back pain and gait problem.  Skin: Negative for pallor and rash.  Neurological: Negative for dizziness,  tremors, weakness, numbness and headaches.  Psychiatric/Behavioral: Negative for suicidal ideas, confusion and sleep disturbance. The patient is not nervous/anxious.     Objective:  BP 184/80 mmHg  Pulse 82  Ht 5\' 5"  (1.651 m)  Wt 155 lb (70.308 kg)  BMI 25.79 kg/m2  SpO2 94%  BP Readings from Last 3 Encounters:  09/03/15 184/80  03/06/15 200/90  03/03/15 160/85    Wt Readings from Last 3 Encounters:  09/03/15 155 lb (70.308 kg)  03/06/15 154 lb (69.854 kg)  03/03/15 155 lb (70.308 kg)    Physical Exam  Constitutional: She appears well-developed. No distress.  HENT:  Head: Normocephalic.  Right Ear: External ear normal.  Left Ear: External ear normal.  Nose: Nose normal.  Mouth/Throat: Oropharynx is clear and moist.  Eyes: Conjunctivae are normal. Pupils are equal, round, and reactive to light. Right eye exhibits no discharge. Left eye exhibits no discharge.  Neck: Normal range of motion. Neck supple. No JVD present. No tracheal deviation present. No thyromegaly present.  Cardiovascular: Normal rate, regular rhythm and normal heart sounds.   Pulmonary/Chest: No stridor. No respiratory distress. She has no wheezes.  Abdominal: Soft. Bowel sounds are normal. She exhibits no distension and no mass. There is no tenderness. There is no rebound and no guarding.  Musculoskeletal: She exhibits tenderness. She exhibits no edema.  Lymphadenopathy:    She has no cervical adenopathy.  Neurological: She displays normal reflexes. No cranial nerve deficit. She exhibits normal muscle tone.  Coordination normal.  Skin: No rash noted. No erythema.  Psychiatric: She has a normal mood and affect. Her behavior is normal. Judgment and thought content normal.  AKs nose L 5th digit lat aspect callus Wax B ears   Procedure Note :     Procedure :  Ear irrigation   Indication:  Cerumen impaction L>R   Risks, including pain, dizziness, eardrum perforation, bleeding, infection and others as  well as benefits were explained to the patient in detail. Verbal consent was obtained and the patient agreed to proceed.    We used "The Elephant Ear Irrigation Device" filled with lukewarm water for irrigation. A large amount wax was recovered. Procedure has also required manual wax removal with an ear loop.   Tolerated well. Complications: None.   Postprocedure instructions :  Call if problems.   Procedure:   L foot callus paring/cutting -  5th digit lat aspect callus Indication:    foot callus, painful  Consent: verbal  Risks and benefits were explained to the patient. Skin was cleaned with alcohol. I removed a large callus carefully with a round blade. Skin remained intact. Pain is better. Tolerated well. Complications: none. Bandaid applied    Lab Results  Component Value Date   WBC 6.0 08/27/2015   HGB 15.1* 08/27/2015   HCT 45.5 08/27/2015   PLT 269.0 08/27/2015   GLUCOSE 115* 08/27/2015   CHOL 171 08/27/2015   TRIG 68.0 08/27/2015   HDL 67.70 08/27/2015   LDLCALC 90 08/27/2015   ALT 16 08/27/2015   AST 25 08/27/2015   NA 138 08/27/2015   K 4.7 08/27/2015   CL 103 08/27/2015   CREATININE 0.78 08/27/2015   BUN 15 08/27/2015   CO2 28 08/27/2015   TSH 0.25* 08/27/2015   HGBA1C 6.2 08/13/2013    Mm Screening Breast Tomo Bilateral  06/19/2015  CLINICAL DATA:  Screening. EXAM: DIGITAL SCREENING BILATERAL MAMMOGRAM WITH 3D TOMO WITH CAD COMPARISON:  Previous exam(s). ACR Breast Density Category c: The breast tissue is heterogeneously dense, which may obscure small masses. FINDINGS: There are no findings suspicious for malignancy. Images were processed with CAD. IMPRESSION: No mammographic evidence of malignancy. A result letter of this screening mammogram will be mailed directly to the patient. RECOMMENDATION: Screening mammogram in one year. (Code:SM-B-01Y) BI-RADS CATEGORY  1: Negative. Electronically Signed   By: Ammie Ferrier M.D.   On: 06/19/2015 16:10     Assessment & Plan:   Diagnoses and all orders for this visit:  Well adult exam  Hearing loss of both ears due to cerumen impaction  Callus of foot  Impacted cerumen, unspecified laterality  Essential hypertension -     Basic metabolic panel; Future  Hypothyroidism due to acquired atrophy of thyroid -     TSH; Future -     T3, free; Future  Low vitamin B12 level  Other orders -     ezetimibe-simvastatin (VYTORIN) 10-20 MG tablet; Take 1 tablet by mouth at bedtime. -     metoprolol succinate (TOPROL-XL) 25 MG 24 hr tablet; Take 1 tablet (25 mg total) by mouth daily. At night -     perindopril (ACEON) 8 MG tablet; Take 1 tablet (8 mg total) by mouth daily. -     verapamil (CALAN) 80 MG tablet; Take 1 tablet (80 mg total) by mouth 2 (two) times daily. -     SYNTHROID 75 MCG tablet; Take 1 tablet (75 mcg total) by mouth daily. -     Cyanocobalamin (  VITAMIN B-12) 500 MCG SUBL; 1 sl qd  I have changed Ms. Ogburn's ezetimibe-simvastatin. I am also having her start on Vitamin B-12. Additionally, I am having her maintain her Calcium Carbonate-Vitamin D, (multivitamin,tx-minerals), Cholecalciferol, gabapentin, metoprolol succinate, perindopril, verapamil, and SYNTHROID.  Meds ordered this encounter  Medications  . ezetimibe-simvastatin (VYTORIN) 10-20 MG tablet    Sig: Take 1 tablet by mouth at bedtime.    Dispense:  90 tablet    Refill:  3  . metoprolol succinate (TOPROL-XL) 25 MG 24 hr tablet    Sig: Take 1 tablet (25 mg total) by mouth daily. At night    Dispense:  90 tablet    Refill:  3  . perindopril (ACEON) 8 MG tablet    Sig: Take 1 tablet (8 mg total) by mouth daily.    Dispense:  90 tablet    Refill:  3  . verapamil (CALAN) 80 MG tablet    Sig: Take 1 tablet (80 mg total) by mouth 2 (two) times daily.    Dispense:  180 tablet    Refill:  3  . SYNTHROID 75 MCG tablet    Sig: Take 1 tablet (75 mcg total) by mouth daily.    Dispense:  90 tablet    Refill:  3  .  Cyanocobalamin (VITAMIN B-12) 500 MCG SUBL    Sig: 1 sl qd    Dispense:  150 tablet    Refill:  11     Follow-up: Return in about 6 months (around 03/02/2016) for a follow-up visit.  Walker Kehr, MD

## 2015-09-03 NOTE — Assessment & Plan Note (Signed)
See procedure 

## 2015-09-03 NOTE — Patient Instructions (Signed)
Preventive Care for Adults, Female A healthy lifestyle and preventive care can promote health and wellness. Preventive health guidelines for women include the following key practices.  A routine yearly physical is a good way to check with your health care provider about your health and preventive screening. It is a chance to share any concerns and updates on your health and to receive a thorough exam.  Visit your dentist for a routine exam and preventive care every 6 months. Brush your teeth twice a day and floss once a day. Good oral hygiene prevents tooth decay and gum disease.  The frequency of eye exams is based on your age, health, family medical history, use of contact lenses, and other factors. Follow your health care provider's recommendations for frequency of eye exams.  Eat a healthy diet. Foods like vegetables, fruits, whole grains, low-fat dairy products, and lean protein foods contain the nutrients you need without too many calories. Decrease your intake of foods high in solid fats, added sugars, and salt. Eat the right amount of calories for you.Get information about a proper diet from your health care provider, if necessary.  Regular physical exercise is one of the most important things you can do for your health. Most adults should get at least 150 minutes of moderate-intensity exercise (any activity that increases your heart rate and causes you to sweat) each week. In addition, most adults need muscle-strengthening exercises on 2 or more days a week.  Maintain a healthy weight. The body mass index (BMI) is a screening tool to identify possible weight problems. It provides an estimate of body fat based on height and weight. Your health care provider can find your BMI and can help you achieve or maintain a healthy weight.For adults 20 years and older:  A BMI below 18.5 is considered underweight.  A BMI of 18.5 to 24.9 is normal.  A BMI of 25 to 29.9 is considered overweight.  A  BMI of 30 and above is considered obese.  Maintain normal blood lipids and cholesterol levels by exercising and minimizing your intake of saturated fat. Eat a balanced diet with plenty of fruit and vegetables. Blood tests for lipids and cholesterol should begin at age 21 and be repeated every 5 years. If your lipid or cholesterol levels are high, you are over 50, or you are at high risk for heart disease, you may need your cholesterol levels checked more frequently.Ongoing high lipid and cholesterol levels should be treated with medicines if diet and exercise are not working.  If you smoke, find out from your health care provider how to quit. If you do not use tobacco, do not start.  Lung cancer screening is recommended for adults aged 39-80 years who are at high risk for developing lung cancer because of a history of smoking. A yearly low-dose CT scan of the lungs is recommended for people who have at least a 30-pack-year history of smoking and are a current smoker or have quit within the past 15 years. A pack year of smoking is smoking an average of 1 pack of cigarettes a day for 1 year (for example: 1 pack a day for 30 years or 2 packs a day for 15 years). Yearly screening should continue until the smoker has stopped smoking for at least 15 years. Yearly screening should be stopped for people who develop a health problem that would prevent them from having lung cancer treatment.  If you are pregnant, do not drink alcohol. If you are  breastfeeding, be very cautious about drinking alcohol. If you are not pregnant and choose to drink alcohol, do not have more than 1 drink per day. One drink is considered to be 12 ounces (355 mL) of beer, 5 ounces (148 mL) of wine, or 1.5 ounces (44 mL) of liquor.  Avoid use of street drugs. Do not share needles with anyone. Ask for help if you need support or instructions about stopping the use of drugs.  High blood pressure causes heart disease and increases the risk  of stroke. Your blood pressure should be checked at least every 1 to 2 years. Ongoing high blood pressure should be treated with medicines if weight loss and exercise do not work.  If you are 69-33 years old, ask your health care provider if you should take aspirin to prevent strokes.  Diabetes screening is done by taking a blood sample to check your blood glucose level after you have not eaten for a certain period of time (fasting). If you are not overweight and you do not have risk factors for diabetes, you should be screened once every 3 years starting at age 47. If you are overweight or obese and you are 15-30 years of age, you should be screened for diabetes every year as part of your cardiovascular risk assessment.  Breast cancer screening is essential preventive care for women. You should practice "breast self-awareness." This means understanding the normal appearance and feel of your breasts and may include breast self-examination. Any changes detected, no matter how small, should be reported to a health care provider. Women in their 18s and 30s should have a clinical breast exam (CBE) by a health care provider as part of a regular health exam every 1 to 3 years. After age 67, women should have a CBE every year. Starting at age 43, women should consider having a mammogram (breast X-ray test) every year. Women who have a family history of breast cancer should talk to their health care provider about genetic screening. Women at a high risk of breast cancer should talk to their health care providers about having an MRI and a mammogram every year.  Breast cancer gene (BRCA)-related cancer risk assessment is recommended for women who have family members with BRCA-related cancers. BRCA-related cancers include breast, ovarian, tubal, and peritoneal cancers. Having family members with these cancers may be associated with an increased risk for harmful changes (mutations) in the breast cancer genes BRCA1 and  BRCA2. Results of the assessment will determine the need for genetic counseling and BRCA1 and BRCA2 testing.  Your health care provider may recommend that you be screened regularly for cancer of the pelvic organs (ovaries, uterus, and vagina). This screening involves a pelvic examination, including checking for microscopic changes to the surface of your cervix (Pap test). You may be encouraged to have this screening done every 3 years, beginning at age 63.  For women ages 59-65, health care providers may recommend pelvic exams and Pap testing every 3 years, or they may recommend the Pap and pelvic exam, combined with testing for human papilloma virus (HPV), every 5 years. Some types of HPV increase your risk of cervical cancer. Testing for HPV may also be done on women of any age with unclear Pap test results.  Other health care providers may not recommend any screening for nonpregnant women who are considered low risk for pelvic cancer and who do not have symptoms. Ask your health care provider if a screening pelvic exam is right for  you.  If you have had past treatment for cervical cancer or a condition that could lead to cancer, you need Pap tests and screening for cancer for at least 20 years after your treatment. If Pap tests have been discontinued, your risk factors (such as having a new sexual partner) need to be reassessed to determine if screening should resume. Some women have medical problems that increase the chance of getting cervical cancer. In these cases, your health care provider may recommend more frequent screening and Pap tests.  Colorectal cancer can be detected and often prevented. Most routine colorectal cancer screening begins at the age of 50 years and continues through age 75 years. However, your health care provider may recommend screening at an earlier age if you have risk factors for colon cancer. On a yearly basis, your health care provider may provide home test kits to check  for hidden blood in the stool. Use of a small camera at the end of a tube, to directly examine the colon (sigmoidoscopy or colonoscopy), can detect the earliest forms of colorectal cancer. Talk to your health care provider about this at age 50, when routine screening begins. Direct exam of the colon should be repeated every 5-10 years through age 75 years, unless early forms of precancerous polyps or small growths are found.  People who are at an increased risk for hepatitis B should be screened for this virus. You are considered at high risk for hepatitis B if:  You were born in a country where hepatitis B occurs often. Talk with your health care provider about which countries are considered high risk.  Your parents were born in a high-risk country and you have not received a shot to protect against hepatitis B (hepatitis B vaccine).  You have HIV or AIDS.  You use needles to inject street drugs.  You live with, or have sex with, someone who has hepatitis B.  You get hemodialysis treatment.  You take certain medicines for conditions like cancer, organ transplantation, and autoimmune conditions.  Hepatitis C blood testing is recommended for all people born from 1945 through 1965 and any individual with known risks for hepatitis C.  Practice safe sex. Use condoms and avoid high-risk sexual practices to reduce the spread of sexually transmitted infections (STIs). STIs include gonorrhea, chlamydia, syphilis, trichomonas, herpes, HPV, and human immunodeficiency virus (HIV). Herpes, HIV, and HPV are viral illnesses that have no cure. They can result in disability, cancer, and death.  You should be screened for sexually transmitted illnesses (STIs) including gonorrhea and chlamydia if:  You are sexually active and are younger than 24 years.  You are older than 24 years and your health care provider tells you that you are at risk for this type of infection.  Your sexual activity has changed  since you were last screened and you are at an increased risk for chlamydia or gonorrhea. Ask your health care provider if you are at risk.  If you are at risk of being infected with HIV, it is recommended that you take a prescription medicine daily to prevent HIV infection. This is called preexposure prophylaxis (PrEP). You are considered at risk if:  You are sexually active and do not regularly use condoms or know the HIV status of your partner(s).  You take drugs by injection.  You are sexually active with a partner who has HIV.  Talk with your health care provider about whether you are at high risk of being infected with HIV. If   you choose to begin PrEP, you should first be tested for HIV. You should then be tested every 3 months for as long as you are taking PrEP.  Osteoporosis is a disease in which the bones lose minerals and strength with aging. This can result in serious bone fractures or breaks. The risk of osteoporosis can be identified using a bone density scan. Women ages 67 years and over and women at risk for fractures or osteoporosis should discuss screening with their health care providers. Ask your health care provider whether you should take a calcium supplement or vitamin D to reduce the rate of osteoporosis.  Menopause can be associated with physical symptoms and risks. Hormone replacement therapy is available to decrease symptoms and risks. You should talk to your health care provider about whether hormone replacement therapy is right for you.  Use sunscreen. Apply sunscreen liberally and repeatedly throughout the day. You should seek shade when your shadow is shorter than you. Protect yourself by wearing long sleeves, pants, a wide-brimmed hat, and sunglasses year round, whenever you are outdoors.  Once a month, do a whole body skin exam, using a mirror to look at the skin on your back. Tell your health care provider of new moles, moles that have irregular borders, moles that  are larger than a pencil eraser, or moles that have changed in shape or color.  Stay current with required vaccines (immunizations).  Influenza vaccine. All adults should be immunized every year.  Tetanus, diphtheria, and acellular pertussis (Td, Tdap) vaccine. Pregnant women should receive 1 dose of Tdap vaccine during each pregnancy. The dose should be obtained regardless of the length of time since the last dose. Immunization is preferred during the 27th-36th week of gestation. An adult who has not previously received Tdap or who does not know her vaccine status should receive 1 dose of Tdap. This initial dose should be followed by tetanus and diphtheria toxoids (Td) booster doses every 10 years. Adults with an unknown or incomplete history of completing a 3-dose immunization series with Td-containing vaccines should begin or complete a primary immunization series including a Tdap dose. Adults should receive a Td booster every 10 years.  Varicella vaccine. An adult without evidence of immunity to varicella should receive 2 doses or a second dose if she has previously received 1 dose. Pregnant females who do not have evidence of immunity should receive the first dose after pregnancy. This first dose should be obtained before leaving the health care facility. The second dose should be obtained 4-8 weeks after the first dose.  Human papillomavirus (HPV) vaccine. Females aged 13-26 years who have not received the vaccine previously should obtain the 3-dose series. The vaccine is not recommended for use in pregnant females. However, pregnancy testing is not needed before receiving a dose. If a female is found to be pregnant after receiving a dose, no treatment is needed. In that case, the remaining doses should be delayed until after the pregnancy. Immunization is recommended for any person with an immunocompromised condition through the age of 61 years if she did not get any or all doses earlier. During the  3-dose series, the second dose should be obtained 4-8 weeks after the first dose. The third dose should be obtained 24 weeks after the first dose and 16 weeks after the second dose.  Zoster vaccine. One dose is recommended for adults aged 30 years or older unless certain conditions are present.  Measles, mumps, and rubella (MMR) vaccine. Adults born  before 1957 generally are considered immune to measles and mumps. Adults born in [birth year] or later should have 1 or more doses of MMR vaccine unless there is a contraindication to the vaccine or there is laboratory evidence of immunity to each of the three diseases. A routine second dose of MMR vaccine should be obtained at least 28 days after the first dose for students attending postsecondary schools, health care workers, or international travelers. People who received inactivated measles vaccine or an unknown type of measles vaccine during 1963-1967 should receive 2 doses of MMR vaccine. People who received inactivated mumps vaccine or an unknown type of mumps vaccine before 1979 and are at high risk for mumps infection should consider immunization with 2 doses of MMR vaccine. For females of childbearing age, rubella immunity should be determined. If there is no evidence of immunity, females who are not pregnant should be vaccinated. If there is no evidence of immunity, females who are pregnant should delay immunization until after pregnancy. Unvaccinated health care workers born before [birth year] who lack laboratory evidence of measles, mumps, or rubella immunity or laboratory confirmation of disease should consider measles and mumps immunization with 2 doses of MMR vaccine or rubella immunization with 1 dose of MMR vaccine.  Pneumococcal 13-valent conjugate (PCV13) vaccine. When indicated, a person who is uncertain of his immunization history and has no record of immunization should receive the PCV13 vaccine. All adults 44 years of age and older should receive this  vaccine. An adult aged 32 years or older who has certain medical conditions and has not been previously immunized should receive 1 dose of PCV13 vaccine. This PCV13 should be followed with a dose of pneumococcal polysaccharide (PPSV23) vaccine. Adults who are at high risk for pneumococcal disease should obtain the PPSV23 vaccine at least 8 weeks after the dose of PCV13 vaccine. Adults older than 80 years of age who have normal immune system function should obtain the PPSV23 vaccine dose at least 1 year after the dose of PCV13 vaccine.  Pneumococcal polysaccharide (PPSV23) vaccine. When PCV13 is also indicated, PCV13 should be obtained first. All adults aged 50 years and older should be immunized. An adult younger than age 9 years who has certain medical conditions should be immunized. Any person who resides in a nursing home or long-term care facility should be immunized. An adult smoker should be immunized. People with an immunocompromised condition and certain other conditions should receive both PCV13 and PPSV23 vaccines. People with human immunodeficiency virus (HIV) infection should be immunized as soon as possible after diagnosis. Immunization during chemotherapy or radiation therapy should be avoided. Routine use of PPSV23 vaccine is not recommended for American Indians, [place] Natives, or people younger than 65 years unless there are medical conditions that require PPSV23 vaccine. When indicated, people who have unknown immunization and have no record of immunization should receive PPSV23 vaccine. One-time revaccination 5 years after the first dose of PPSV23 is recommended for people aged 19-64 years who have chronic kidney failure, nephrotic syndrome, asplenia, or immunocompromised conditions. People who received 1-2 doses of PPSV23 before age 22 years should receive another dose of PPSV23 vaccine at age 5 years or later if at least 5 years have passed since the previous dose. Doses of PPSV23 are not  needed for people immunized with PPSV23 at or after age 81 years.  Meningococcal vaccine. Adults with asplenia or persistent complement component deficiencies should receive 2 doses of quadrivalent meningococcal conjugate (MenACWY-D) vaccine. The doses should be obtained  at least 2 months apart. Microbiologists working with certain meningococcal bacteria, [place] recruits, people at risk during an outbreak, and people who travel to or live in countries with a high rate of meningitis should be immunized. A first-year college student up through age 34 years who is living in a residence hall should receive a dose if she did not receive a dose on or after her 16th birthday. Adults who have certain high-risk conditions should receive one or more doses of vaccine.  Hepatitis A vaccine. Adults who wish to be protected from this disease, have certain high-risk conditions, work with hepatitis A-infected animals, work in hepatitis A research labs, or travel to or work in countries with a high rate of hepatitis A should be immunized. Adults who were previously unvaccinated and who anticipate close contact with an international adoptee during the first 60 days after arrival in the [place] from a country with a high rate of hepatitis A should be immunized.  Hepatitis B vaccine. Adults who wish to be protected from this disease, have certain high-risk conditions, may be exposed to blood or other infectious body fluids, are household contacts or sex partners of hepatitis B positive people, are clients or workers in certain care facilities, or travel to or work in countries with a high rate of hepatitis B should be immunized.  Haemophilus influenzae type b (Hib) vaccine. A previously unvaccinated person with asplenia or sickle cell disease or having a scheduled splenectomy should receive 1 dose of Hib vaccine. Regardless of previous immunization, a recipient of a hematopoietic stem cell transplant should receive a  3-dose series 6-12 months after her successful transplant. Hib vaccine is not recommended for adults with HIV infection. Preventive Services / Frequency Ages 35 to 4 years  Blood pressure check.** / Every 3-5 years.  Lipid and cholesterol check.** / Every 5 years beginning at age 60.  Clinical breast exam.** / Every 3 years for women in their 71s and 10s.  BRCA-related cancer risk assessment.** / For women who have family members with a BRCA-related cancer (breast, ovarian, tubal, or peritoneal cancers).  Pap test.** / Every 2 years from ages 76 through 26. Every 3 years starting at age 61 through age 76 or 93 with a history of 3 consecutive normal Pap tests.  HPV screening.** / Every 3 years from ages 37 through ages 60 to 51 with a history of 3 consecutive normal Pap tests.  Hepatitis C blood test.** / For any individual with known risks for hepatitis C.  Skin self-exam. / Monthly.  Influenza vaccine. / Every year.  Tetanus, diphtheria, and acellular pertussis (Tdap, Td) vaccine.** / Consult your health care provider. Pregnant women should receive 1 dose of Tdap vaccine during each pregnancy. 1 dose of Td every 10 years.  Varicella vaccine.** / Consult your health care provider. Pregnant females who do not have evidence of immunity should receive the first dose after pregnancy.  HPV vaccine. / 3 doses over 6 months, if 93 and younger. The vaccine is not recommended for use in pregnant females. However, pregnancy testing is not needed before receiving a dose.  Measles, mumps, rubella (MMR) vaccine.** / You need at least 1 dose of MMR if you were born in 1957 or later. You may also need a 2nd dose. For females of childbearing age, rubella immunity should be determined. If there is no evidence of immunity, females who are not pregnant should be vaccinated. If there is no evidence of immunity, females who are  pregnant should delay immunization until after pregnancy.  Pneumococcal  13-valent conjugate (PCV13) vaccine.** / Consult your health care provider.  Pneumococcal polysaccharide (PPSV23) vaccine.** / 1 to 2 doses if you smoke cigarettes or if you have certain conditions.  Meningococcal vaccine.** / 1 dose if you are age 68 to 8 years and a Market researcher living in a residence hall, or have one of several medical conditions, you need to get vaccinated against meningococcal disease. You may also need additional booster doses.  Hepatitis A vaccine.** / Consult your health care provider.  Hepatitis B vaccine.** / Consult your health care provider.  Haemophilus influenzae type b (Hib) vaccine.** / Consult your health care provider. Ages 7 to 53 years  Blood pressure check.** / Every year.  Lipid and cholesterol check.** / Every 5 years beginning at age 25 years.  Lung cancer screening. / Every year if you are aged 11-80 years and have a 30-pack-year history of smoking and currently smoke or have quit within the past 15 years. Yearly screening is stopped once you have quit smoking for at least 15 years or develop a health problem that would prevent you from having lung cancer treatment.  Clinical breast exam.** / Every year after age 48 years.  BRCA-related cancer risk assessment.** / For women who have family members with a BRCA-related cancer (breast, ovarian, tubal, or peritoneal cancers).  Mammogram.** / Every year beginning at age 41 years and continuing for as long as you are in good health. Consult with your health care provider.  Pap test.** / Every 3 years starting at age 65 years through age 37 or 70 years with a history of 3 consecutive normal Pap tests.  HPV screening.** / Every 3 years from ages 72 years through ages 60 to 40 years with a history of 3 consecutive normal Pap tests.  Fecal occult blood test (FOBT) of stool. / Every year beginning at age 21 years and continuing until age 5 years. You may not need to do this test if you get  a colonoscopy every 10 years.  Flexible sigmoidoscopy or colonoscopy.** / Every 5 years for a flexible sigmoidoscopy or every 10 years for a colonoscopy beginning at age 35 years and continuing until age 48 years.  Hepatitis C blood test.** / For all people born from [birth year] through 1963 and any individual with known risks for hepatitis C.  Skin self-exam. / Monthly.  Influenza vaccine. / Every year.  Tetanus, diphtheria, and acellular pertussis (Tdap/Td) vaccine.** / Consult your health care provider. Pregnant women should receive 1 dose of Tdap vaccine during each pregnancy. 1 dose of Td every 10 years.  Varicella vaccine.** / Consult your health care provider. Pregnant females who do not have evidence of immunity should receive the first dose after pregnancy.  Zoster vaccine.** / 1 dose for adults aged 30 years or older.  Measles, mumps, rubella (MMR) vaccine.** / You need at least 1 dose of MMR if you were born in 1957 or later. You may also need a second dose. For females of childbearing age, rubella immunity should be determined. If there is no evidence of immunity, females who are not pregnant should be vaccinated. If there is no evidence of immunity, females who are pregnant should delay immunization until after pregnancy.  Pneumococcal 13-valent conjugate (PCV13) vaccine.** / Consult your health care provider.  Pneumococcal polysaccharide (PPSV23) vaccine.** / 1 to 2 doses if you smoke cigarettes or if you have certain conditions.  Meningococcal vaccine.** /  Consult your health care provider.  Hepatitis A vaccine.** / Consult your health care provider.  Hepatitis B vaccine.** / Consult your health care provider.  Haemophilus influenzae type b (Hib) vaccine.** / Consult your health care provider. Ages 64 years and over  Blood pressure check.** / Every year.  Lipid and cholesterol check.** / Every 5 years beginning at age 23 years.  Lung cancer screening. / Every year if you  are aged 16-80 years and have a 30-pack-year history of smoking and currently smoke or have quit within the past 15 years. Yearly screening is stopped once you have quit smoking for at least 15 years or develop a health problem that would prevent you from having lung cancer treatment.  Clinical breast exam.** / Every year after age 74 years.  BRCA-related cancer risk assessment.** / For women who have family members with a BRCA-related cancer (breast, ovarian, tubal, or peritoneal cancers).  Mammogram.** / Every year beginning at age 44 years and continuing for as long as you are in good health. Consult with your health care provider.  Pap test.** / Every 3 years starting at age 58 years through age 22 or 39 years with 3 consecutive normal Pap tests. Testing can be stopped between 65 and 70 years with 3 consecutive normal Pap tests and no abnormal Pap or HPV tests in the past 10 years.  HPV screening.** / Every 3 years from ages 64 years through ages 70 or 61 years with a history of 3 consecutive normal Pap tests. Testing can be stopped between 65 and 70 years with 3 consecutive normal Pap tests and no abnormal Pap or HPV tests in the past 10 years.  Fecal occult blood test (FOBT) of stool. / Every year beginning at age 40 years and continuing until age 27 years. You may not need to do this test if you get a colonoscopy every 10 years.  Flexible sigmoidoscopy or colonoscopy.** / Every 5 years for a flexible sigmoidoscopy or every 10 years for a colonoscopy beginning at age 7 years and continuing until age 32 years.  Hepatitis C blood test.** / For all people born from [birth year] through 1963 and any individual with known risks for hepatitis C.  Osteoporosis screening.** / A one-time screening for women ages 30 years and over and women at risk for fractures or osteoporosis.  Skin self-exam. / Monthly.  Influenza vaccine. / Every year.  Tetanus, diphtheria, and acellular pertussis (Tdap/Td)  vaccine.** / 1 dose of Td every 10 years.  Varicella vaccine.** / Consult your health care provider.  Zoster vaccine.** / 1 dose for adults aged 35 years or older.  Pneumococcal 13-valent conjugate (PCV13) vaccine.** / Consult your health care provider.  Pneumococcal polysaccharide (PPSV23) vaccine.** / 1 dose for all adults aged 46 years and older.  Meningococcal vaccine.** / Consult your health care provider.  Hepatitis A vaccine.** / Consult your health care provider.  Hepatitis B vaccine.** / Consult your health care provider.  Haemophilus influenzae type b (Hib) vaccine.** / Consult your health care provider. ** Family history and personal history of risk and conditions may change your health care provider's recommendations.   This information is not intended to replace advice given to you by your health care provider. Make sure you discuss any questions you have with your health care provider.   Document Released: 09/27/2001 Document Revised: 08/22/2014 Document Reviewed: 12/27/2010 Elsevier Interactive Patient Education [employer].

## 2015-09-03 NOTE — Assessment & Plan Note (Signed)
Here for medicare wellness/physical  Diet: heart healthy  Physical activity: not sedentary  Depression/mood screen: negative  Hearing: decreased to whispered voice  Visual acuity: grossly normal, performs annual eye exam  ADLs: capable  Fall risk: low  Home safety: good  Cognitive evaluation: intact to orientation, naming, recall and repetition  EOL planning: adv directives, full code/ I agree  I have personally reviewed and have noted  1. The patient's medical, surgical and social history  2. Their use of alcohol, tobacco or illicit drugs  3. Their current medications and supplements  4. The patient's functional ability including ADL's, fall risks, home safety risks and hearing or visual impairment.  5. Diet and physical activities  6. Evidence for depression or mood disorders 7. The roster of all physicians providing medical care to patient - is listed in the Snapshot section of the chart and reviewed today.    Today patient counseled on age appropriate routine health concerns for screening and prevention, each reviewed and up to date or declined. Immunizations reviewed and up to date or declined. Labs ordered and reviewed. Risk factors for depression reviewed and negative. Hearing function and visual acuity are intact. ADLs screened and addressed as needed. Functional ability and level of safety reviewed and appropriate. Education, counseling and referrals performed based on assessed risks today. Patient provided with a copy of personalized plan for preventive services.

## 2015-10-29 ENCOUNTER — Other Ambulatory Visit (INDEPENDENT_AMBULATORY_CARE_PROVIDER_SITE_OTHER): Payer: Medicare Other

## 2015-10-29 DIAGNOSIS — E034 Atrophy of thyroid (acquired): Secondary | ICD-10-CM

## 2015-10-29 DIAGNOSIS — I10 Essential (primary) hypertension: Secondary | ICD-10-CM

## 2015-10-29 DIAGNOSIS — E038 Other specified hypothyroidism: Secondary | ICD-10-CM | POA: Diagnosis not present

## 2015-10-29 LAB — BASIC METABOLIC PANEL
BUN: 15 mg/dL (ref 6–23)
CO2: 28 mEq/L (ref 19–32)
Calcium: 9.4 mg/dL (ref 8.4–10.5)
Chloride: 103 mEq/L (ref 96–112)
Creatinine, Ser: 0.79 mg/dL (ref 0.40–1.20)
GFR: 74.54 mL/min (ref >60.00)
Glucose, Bld: 101 mg/dL — ABNORMAL HIGH (ref 70–99)
Potassium: 4.5 mEq/L (ref 3.5–5.1)
Sodium: 138 mEq/L (ref 135–145)

## 2015-10-29 LAB — TSH: TSH: 0.24 u[IU]/mL — AB (ref 0.35–4.50)

## 2015-10-29 LAB — T3, FREE: T3, Free: 2.9 pg/mL (ref 2.3–4.2)

## 2016-03-02 ENCOUNTER — Encounter: Payer: Self-pay | Admitting: Internal Medicine

## 2016-03-02 ENCOUNTER — Ambulatory Visit (INDEPENDENT_AMBULATORY_CARE_PROVIDER_SITE_OTHER): Payer: Medicare Other | Admitting: Internal Medicine

## 2016-03-02 VITALS — BP 159/82 | HR 83 | Wt 156.0 lb

## 2016-03-02 DIAGNOSIS — E538 Deficiency of other specified B group vitamins: Secondary | ICD-10-CM

## 2016-03-02 DIAGNOSIS — E785 Hyperlipidemia, unspecified: Secondary | ICD-10-CM

## 2016-03-02 DIAGNOSIS — E038 Other specified hypothyroidism: Secondary | ICD-10-CM

## 2016-03-02 DIAGNOSIS — E034 Atrophy of thyroid (acquired): Secondary | ICD-10-CM

## 2016-03-02 DIAGNOSIS — K51918 Ulcerative colitis, unspecified with other complication: Secondary | ICD-10-CM | POA: Diagnosis not present

## 2016-03-02 DIAGNOSIS — R739 Hyperglycemia, unspecified: Secondary | ICD-10-CM

## 2016-03-02 DIAGNOSIS — I10 Essential (primary) hypertension: Secondary | ICD-10-CM

## 2016-03-02 DIAGNOSIS — Z0000 Encounter for general adult medical examination without abnormal findings: Secondary | ICD-10-CM

## 2016-03-02 MED ORDER — SYNTHROID 75 MCG PO TABS: 75.0000 ug | ORAL_TABLET | Freq: Every day | ORAL | Status: DC

## 2016-03-02 MED ORDER — PERINDOPRIL ERBUMINE 8 MG PO TABS: 8.0000 mg | ORAL_TABLET | Freq: Every day | ORAL | Status: DC

## 2016-03-02 MED ORDER — EZETIMIBE-SIMVASTATIN 10-20 MG PO TABS: 1.0000 | ORAL_TABLET | Freq: Every day | ORAL | Status: DC

## 2016-03-02 MED ORDER — VERAPAMIL HCL 80 MG PO TABS: 80.0000 mg | ORAL_TABLET | Freq: Two times a day (BID) | ORAL | Status: DC

## 2016-03-02 NOTE — Assessment & Plan Note (Signed)
On Levothroid

## 2016-03-02 NOTE — Progress Notes (Signed)
Pre visit review using our clinic review tool, if applicable. No additional management support is needed unless otherwise documented below in the visit note. 

## 2016-03-02 NOTE — Patient Instructions (Signed)
[employer] GERD wedge pillow

## 2016-03-02 NOTE — Assessment & Plan Note (Signed)
Labs On B12 

## 2016-03-02 NOTE — Progress Notes (Signed)
Subjective:  Patient ID: Jasmine Mccormick, female    DOB: 27-Oct-1933  Age: 82 y.o. MRN: [id number]  CC: No chief complaint on file.   HPI Jasmine Mccormick presents for HTN, hypothyroidism, B12 deficiency f/u  Outpatient Prescriptions Prior to Visit  Medication Sig Dispense Refill  . Calcium Carbonate-Vitamin D 600-400 MG-UNIT per tablet Take 1 tablet by mouth 2 (two) times daily.      . Cholecalciferol 1000 UNITS tablet Take 1,000 Units by mouth daily.      . Cyanocobalamin (VITAMIN B-12) 500 MCG SUBL 1 sl qd 150 tablet 11  . ezetimibe-simvastatin (VYTORIN) 10-20 MG tablet Take 1 tablet by mouth at bedtime. 90 tablet 3  . metoprolol succinate (TOPROL-XL) 25 MG 24 hr tablet Take 1 tablet (25 mg total) by mouth daily. At night 90 tablet 3  . Multiple Vitamins-Minerals (MULTIVITAMIN,TX-MINERALS) tablet Take 1 tablet by mouth daily.      . perindopril (ACEON) 8 MG tablet Take 1 tablet (8 mg total) by mouth daily. 90 tablet 3  . SYNTHROID 75 MCG tablet Take 1 tablet (75 mcg total) by mouth daily. 90 tablet 3  . verapamil (CALAN) 80 MG tablet Take 1 tablet (80 mg total) by mouth 2 (two) times daily. 180 tablet 3  . gabapentin (NEURONTIN) 100 MG capsule One pill every eight hours as needed; dose may be increased by one pill each dose after 72 hours if only partially effective (Patient not taking: Reported on 03/02/2016) 30 capsule 2   No facility-administered medications prior to visit.    ROS Review of Systems  Constitutional: Negative for chills, activity change, appetite change, fatigue and unexpected weight change.  HENT: Negative for congestion, mouth sores and sinus pressure.   Eyes: Negative for visual disturbance.  Respiratory: Negative for cough and chest tightness.   Gastrointestinal: Negative for nausea and abdominal pain.  Genitourinary: Negative for frequency, difficulty urinating and vaginal pain.  Musculoskeletal: Negative for back pain and gait problem.  Skin:  Negative for pallor and rash.  Neurological: Negative for dizziness, tremors, weakness, numbness and headaches.  Psychiatric/Behavioral: Negative for confusion and sleep disturbance. The patient is not nervous/anxious.     Objective:  BP 180/90 mmHg  Pulse 83  Wt 156 lb (70.761 kg)  SpO2 95%  BP Readings from Last 3 Encounters:  03/02/16 180/90  09/03/15 184/80  03/06/15 200/90    Wt Readings from Last 3 Encounters:  03/02/16 156 lb (70.761 kg)  09/03/15 155 lb (70.308 kg)  03/06/15 154 lb (69.854 kg)    Physical Exam  Constitutional: She appears well-developed. No distress.  HENT:  Head: Normocephalic.  Right Ear: External ear normal.  Left Ear: External ear normal.  Nose: Nose normal.  Mouth/Throat: Oropharynx is clear and moist.  Eyes: Conjunctivae are normal. Pupils are equal, round, and reactive to light. Right eye exhibits no discharge. Left eye exhibits no discharge.  Neck: Normal range of motion. Neck supple. No JVD present. No tracheal deviation present. No thyromegaly present.  Cardiovascular: Normal rate, regular rhythm and normal heart sounds.   Pulmonary/Chest: No stridor. No respiratory distress. She has no wheezes.  Abdominal: Soft. Bowel sounds are normal. She exhibits no distension and no mass. There is no tenderness. There is no rebound and no guarding.  Musculoskeletal: She exhibits no edema or tenderness.  Lymphadenopathy:    She has no cervical adenopathy.  Neurological: She displays normal reflexes. No cranial nerve deficit. She exhibits normal muscle tone. Coordination normal.  Skin:  No rash noted. No erythema.  Psychiatric: She has a normal mood and affect. Her behavior is normal. Judgment and thought content normal.  colostomy bag R   Lab Results  Component Value Date   WBC 6.0 08/27/2015   HGB 15.1* 08/27/2015   HCT 45.5 08/27/2015   PLT 269.0 08/27/2015   GLUCOSE 101* 10/29/2015   CHOL 171 08/27/2015   TRIG 68.0 08/27/2015   HDL 67.70  08/27/2015   LDLCALC 90 08/27/2015   ALT 16 08/27/2015   AST 25 08/27/2015   NA 138 10/29/2015   K 4.5 10/29/2015   CL 103 10/29/2015   CREATININE 0.79 10/29/2015   BUN 15 10/29/2015   CO2 28 10/29/2015   TSH 0.24* 10/29/2015   HGBA1C 6.2 08/13/2013    Mm Screening Breast Tomo Bilateral  06/19/2015  CLINICAL DATA:  Screening. EXAM: DIGITAL SCREENING BILATERAL MAMMOGRAM WITH 3D TOMO WITH CAD COMPARISON:  Previous exam(s). ACR Breast Density Category c: The breast tissue is heterogeneously dense, which may obscure small masses. FINDINGS: There are no findings suspicious for malignancy. Images were processed with CAD. IMPRESSION: No mammographic evidence of malignancy. A result letter of this screening mammogram will be mailed directly to the patient. RECOMMENDATION: Screening mammogram in one year. (Code:SM-B-01Y) BI-RADS CATEGORY  1: Negative. Electronically Signed   By: Ammie Ferrier M.D.   On: 06/19/2015 16:10    Assessment & Plan:   There are no diagnoses linked to this encounter. I am having Jasmine Mccormick maintain her Calcium Carbonate-Vitamin D, (multivitamin,tx-minerals), Cholecalciferol, gabapentin, ezetimibe-simvastatin, metoprolol succinate, perindopril, verapamil, SYNTHROID, and Vitamin B-12.  No orders of the defined types were placed in this encounter.     Follow-up: No Follow-up on file.  Walker Kehr, MD

## 2016-03-02 NOTE — Assessment & Plan Note (Signed)
Metoprolol, Verapamil, Aceon BP is ok at home - always nl

## 2016-03-02 NOTE — Assessment & Plan Note (Signed)
Labs

## 2016-04-08 ENCOUNTER — Other Ambulatory Visit: Payer: Self-pay

## 2016-06-01 DIAGNOSIS — Z23 Encounter for immunization: Secondary | ICD-10-CM | POA: Diagnosis not present

## 2016-07-25 DIAGNOSIS — H40013 Open angle with borderline findings, low risk, bilateral: Secondary | ICD-10-CM | POA: Diagnosis not present

## 2016-07-25 DIAGNOSIS — H35361 Drusen (degenerative) of macula, right eye: Secondary | ICD-10-CM | POA: Diagnosis not present

## 2016-07-25 DIAGNOSIS — I708 Atherosclerosis of other arteries: Secondary | ICD-10-CM | POA: Diagnosis not present

## 2016-07-25 DIAGNOSIS — H35033 Hypertensive retinopathy, bilateral: Secondary | ICD-10-CM | POA: Diagnosis not present

## 2016-09-08 ENCOUNTER — Other Ambulatory Visit (INDEPENDENT_AMBULATORY_CARE_PROVIDER_SITE_OTHER): Payer: Medicare Other

## 2016-09-08 DIAGNOSIS — K51918 Ulcerative colitis, unspecified with other complication: Secondary | ICD-10-CM

## 2016-09-08 DIAGNOSIS — E538 Deficiency of other specified B group vitamins: Secondary | ICD-10-CM | POA: Diagnosis not present

## 2016-09-08 DIAGNOSIS — Z0000 Encounter for general adult medical examination without abnormal findings: Secondary | ICD-10-CM

## 2016-09-08 DIAGNOSIS — E785 Hyperlipidemia, unspecified: Secondary | ICD-10-CM | POA: Diagnosis not present

## 2016-09-08 LAB — BASIC METABOLIC PANEL
BUN: 12 mg/dL (ref 6–23)
CHLORIDE: 104 meq/L (ref 96–112)
CO2: 29 meq/L (ref 19–32)
Calcium: 9.3 mg/dL (ref 8.4–10.5)
Creatinine, Ser: 0.75 mg/dL (ref 0.40–1.20)
GFR: 78.97 mL/min (ref >60.00)
Glucose, Bld: 98 mg/dL (ref 70–99)
POTASSIUM: 4.3 meq/L (ref 3.5–5.1)
SODIUM: 139 meq/L (ref 135–145)

## 2016-09-08 LAB — HEPATIC FUNCTION PANEL
ALBUMIN: 3.9 g/dL (ref 3.5–5.2)
ALK PHOS: 66 U/L (ref 39–117)
ALT: 18 U/L (ref 0–35)
AST: 26 U/L (ref 0–37)
Bilirubin, Direct: 0.1 mg/dL (ref 0.0–0.3)
TOTAL PROTEIN: 6.5 g/dL (ref 6.0–8.3)
Total Bilirubin: 0.4 mg/dL (ref 0.2–1.2)

## 2016-09-08 LAB — VITAMIN B12: Vitamin B-12: 818 pg/mL (ref 211–911)

## 2016-09-08 LAB — CBC WITH DIFFERENTIAL/PLATELET
BASOS ABS: 0 10*3/uL (ref 0.0–0.1)
Basophils Relative: 0.6 % (ref 0.0–3.0)
Eosinophils Absolute: 0.1 10*3/uL (ref 0.0–0.7)
Eosinophils Relative: 2 % (ref 0.0–5.0)
HCT: 41.3 % (ref 36.0–46.0)
Hemoglobin: 13.9 g/dL (ref 12.0–15.0)
LYMPHS ABS: 1.4 10*3/uL (ref 0.7–4.0)
Lymphocytes Relative: 21.9 % (ref 12.0–46.0)
MCHC: 33.7 g/dL (ref 30.0–36.0)
MCV: 89.4 fl (ref 78.0–100.0)
MONO ABS: 0.8 10*3/uL (ref 0.1–1.0)
MONOS PCT: 12.1 % — AB (ref 3.0–12.0)
NEUTROS PCT: 63.4 % (ref 43.0–77.0)
Neutro Abs: 3.9 10*3/uL (ref 1.4–7.7)
Platelets: 257 10*3/uL (ref 150.0–400.0)
RBC: 4.62 Mil/uL (ref 3.87–5.11)
RDW: 13.9 % (ref 11.5–15.5)
WBC: 6.2 10*3/uL (ref 4.0–10.5)

## 2016-09-08 LAB — URINALYSIS
Bilirubin Urine: NEGATIVE
HGB URINE DIPSTICK: NEGATIVE
Ketones, ur: NEGATIVE
Leukocytes, UA: NEGATIVE
NITRITE: NEGATIVE
PH: 7 (ref 5.0–8.0)
Specific Gravity, Urine: 1.01 (ref 1.000–1.030)
TOTAL PROTEIN, URINE-UPE24: NEGATIVE
Urine Glucose: NEGATIVE
Urobilinogen, UA: 0.2 (ref 0.0–1.0)

## 2016-09-08 LAB — LIPID PANEL
CHOL/HDL RATIO: 2
Cholesterol: 164 mg/dL (ref 0–200)
HDL: 69.8 mg/dL (ref >39.00)
LDL CALC: 81 mg/dL (ref 0–99)
NonHDL: 94.61
Triglycerides: 68 mg/dL (ref 0.0–149.0)
VLDL: 13.6 mg/dL (ref 0.0–40.0)

## 2016-09-08 LAB — TSH: TSH: 0.32 u[IU]/mL — ABNORMAL LOW (ref 0.35–4.50)

## 2016-09-14 ENCOUNTER — Ambulatory Visit (INDEPENDENT_AMBULATORY_CARE_PROVIDER_SITE_OTHER): Payer: Medicare Other | Admitting: Internal Medicine

## 2016-09-14 ENCOUNTER — Encounter: Payer: Self-pay | Admitting: Internal Medicine

## 2016-09-14 VITALS — BP 150/90 | HR 80 | Temp 97.6°F | Resp 20 | Ht 65.0 in | Wt 160.8 lb

## 2016-09-14 DIAGNOSIS — Z0000 Encounter for general adult medical examination without abnormal findings: Secondary | ICD-10-CM | POA: Diagnosis not present

## 2016-09-14 DIAGNOSIS — E034 Atrophy of thyroid (acquired): Secondary | ICD-10-CM

## 2016-09-14 DIAGNOSIS — I10 Essential (primary) hypertension: Secondary | ICD-10-CM | POA: Diagnosis not present

## 2016-09-14 DIAGNOSIS — E538 Deficiency of other specified B group vitamins: Secondary | ICD-10-CM

## 2016-09-14 MED ORDER — LEVOTHYROXINE SODIUM 50 MCG PO TABS: 50.0000 ug | ORAL_TABLET | Freq: Every day | ORAL | 3 refills | Status: DC

## 2016-09-14 NOTE — Progress Notes (Signed)
Subjective:  Patient ID: Jasmine Mccormick, female    DOB: 11-Mar-1934  Age: 83 y.o. MRN: [id number]  CC: Medicare Wellness   HPI MILANNI AYUB presents for a well exam. F/u hypothyroidism, dyslipidemia, B12 def f/u  Outpatient Medications Prior to Visit  Medication Sig Dispense Refill  . Cholecalciferol 1000 UNITS tablet Take 1,000 Units by mouth daily.      . Cyanocobalamin (VITAMIN B-12) 500 MCG SUBL 1 sl qd 150 tablet 11  . ezetimibe-simvastatin (VYTORIN) 10-20 MG tablet Take 1 tablet by mouth at bedtime. 90 tablet 3  . metoprolol succinate (TOPROL-XL) 25 MG 24 hr tablet Take 1 tablet (25 mg total) by mouth daily. At night 90 tablet 3  . Multiple Vitamins-Minerals (MULTIVITAMIN,TX-MINERALS) tablet Take 1 tablet by mouth daily.      . perindopril (ACEON) 8 MG tablet Take 1 tablet (8 mg total) by mouth daily. 90 tablet 3  . SYNTHROID 75 MCG tablet Take 1 tablet (75 mcg total) by mouth daily. 90 tablet 3  . verapamil (CALAN) 80 MG tablet Take 1 tablet (80 mg total) by mouth 2 (two) times daily. 180 tablet 3  . Calcium Carbonate-Vitamin D 600-400 MG-UNIT per tablet Take 1 tablet by mouth 2 (two) times daily.      [name] gabapentin (NEURONTIN) 100 MG capsule One pill every eight hours as needed; dose may be increased by one pill each dose after 72 hours if only partially effective (Patient not taking: Reported on 03/02/2016) 30 capsule 2   No facility-administered medications prior to visit.     ROS Review of Systems  Constitutional: Negative for activity change, appetite change, chills, fatigue and unexpected weight change.  HENT: Negative for congestion, mouth sores and sinus pressure.   Eyes: Negative for visual disturbance.  Respiratory: Negative for cough and chest tightness.   Gastrointestinal: Negative for abdominal pain and nausea.  Genitourinary: Negative for difficulty urinating, frequency and vaginal pain.  Musculoskeletal: Negative for back pain and gait problem.  Skin:  Negative for pallor and rash.  Neurological: Negative for dizziness, tremors, weakness, numbness and headaches.  Psychiatric/Behavioral: Negative for confusion, sleep disturbance and suicidal ideas.    Objective:  BP (!) 150/90 (BP Location: Left Arm, Patient Position: Sitting, Cuff Size: Normal)   Pulse 80   Temp 97.6 F (36.4 C) (Oral)   Resp 20   Ht 5' 5"  (1.651 m)   Wt 160 lb 12 oz (72.9 kg)   SpO2 97%   BMI 26.75 kg/m   BP Readings from Last 3 Encounters:  09/14/16 (!) 150/90  03/02/16 (!) 159/82  09/03/15 (!) 184/80    Wt Readings from Last 3 Encounters:  09/14/16 160 lb 12 oz (72.9 kg)  03/02/16 156 lb (70.8 kg)  09/03/15 155 lb (70.3 kg)    Physical Exam  Constitutional: She appears well-developed. No distress.  HENT:  Head: Normocephalic.  Right Ear: External ear normal.  Left Ear: External ear normal.  Nose: Nose normal.  Mouth/Throat: Oropharynx is clear and moist.  Eyes: Conjunctivae are normal. Pupils are equal, round, and reactive to light. Right eye exhibits no discharge. Left eye exhibits no discharge.  Neck: Normal range of motion. Neck supple. No JVD present. No tracheal deviation present. No thyromegaly present.  Cardiovascular: Normal rate, regular rhythm and normal heart sounds.   Pulmonary/Chest: No stridor. No respiratory distress. She has no wheezes.  Abdominal: Soft. Bowel sounds are normal. She exhibits no distension and no mass. There is no tenderness. There  is no rebound and no guarding.  Musculoskeletal: She exhibits no edema or tenderness.  Lymphadenopathy:    She has no cervical adenopathy.  Neurological: She displays normal reflexes. No cranial nerve deficit. She exhibits normal muscle tone. Coordination abnormal.  Skin: No rash noted. No erythema.  Psychiatric: She has a normal mood and affect. Her behavior is normal. Judgment and thought content normal.  stoma Kyphosis - thor spine  Lab Results  Component Value Date   WBC 6.2  09/08/2016   HGB 13.9 09/08/2016   HCT 41.3 09/08/2016   PLT 257.0 09/08/2016   GLUCOSE 98 09/08/2016   CHOL 164 09/08/2016   TRIG 68.0 09/08/2016   HDL 69.80 09/08/2016   LDLCALC 81 09/08/2016   ALT 18 09/08/2016   AST 26 09/08/2016   NA 139 09/08/2016   K 4.3 09/08/2016   CL 104 09/08/2016   CREATININE 0.75 09/08/2016   BUN 12 09/08/2016   CO2 29 09/08/2016   TSH 0.32 (L) 09/08/2016   HGBA1C 6.2 08/13/2013    Mm Screening Breast Tomo Bilateral  Result Date: 06/18/2015 CLINICAL DATA:  Screening. EXAM: DIGITAL SCREENING BILATERAL MAMMOGRAM WITH 3D TOMO WITH CAD COMPARISON:  Previous exam(s). ACR Breast Density Category c: The breast tissue is heterogeneously dense, which may obscure small masses. FINDINGS: There are no findings suspicious for malignancy. Images were processed with CAD. IMPRESSION: No mammographic evidence of malignancy. A result letter of this screening mammogram will be mailed directly to the patient. RECOMMENDATION: Screening mammogram in one year. (Code:SM-B-01Y) BI-RADS CATEGORY  1: Negative. Electronically Signed   By: Ammie Ferrier M.D.   On: 06/19/2015 16:10    Assessment & Plan:   There are no diagnoses linked to this encounter. I have discontinued Ms. Yorio's Calcium Carbonate-Vitamin D and gabapentin. I am also having her maintain her (multivitamin,tx-minerals), Cholecalciferol, metoprolol succinate, Vitamin B-12, ezetimibe-simvastatin, SYNTHROID, verapamil, and perindopril.  No orders of the defined types were placed in this encounter.    Follow-up: No Follow-up on file.  Walker Kehr, MD

## 2016-09-14 NOTE — Assessment & Plan Note (Signed)
Here for medicare wellness/physical  Diet: heart healthy  Physical activity: not sedentary  Depression/mood screen: negative  Hearing: intact to whispered voice  Visual acuity: grossly normal w/glasses, performs annual eye exam  ADLs: capable  Fall risk: low  Home safety: good  Cognitive evaluation: intact to orientation, naming, recall and repetition  EOL planning: adv directives, full code/ I agree  I have personally reviewed and have noted  1. The patient's medical, surgical and social history  2. Their use of alcohol, tobacco or illicit drugs  3. Their current medications and supplements  4. The patient's functional ability including ADL's, fall risks, home safety risks and hearing or visual impairment.  5. Diet and physical activities  6. Evidence for depression or mood disorders 7. The roster of all physicians providing medical care to patient - is listed in the Snapshot section of the chart and reviewed today.    Today patient counseled on age appropriate routine health concerns for screening and prevention, each reviewed and up to date or declined. Immunizations reviewed and up to date or declined. Labs ordered and reviewed. Risk factors for depression reviewed and negative. Hearing function and visual acuity are intact. ADLs screened and addressed as needed. Functional ability and level of safety reviewed and appropriate. Education, counseling and referrals performed based on assessed risks today. Patient provided with a copy of personalized plan for preventive services.

## 2016-09-14 NOTE — Assessment & Plan Note (Signed)
TSH, FT4 in 6 mo

## 2016-09-14 NOTE — Assessment & Plan Note (Signed)
On B12 

## 2016-09-14 NOTE — Assessment & Plan Note (Signed)
Cont w/current meds

## 2016-09-14 NOTE — Progress Notes (Signed)
Pre visit review using our clinic review tool, if applicable. No additional management support is needed unless otherwise documented below in the visit note. 

## 2016-11-07 ENCOUNTER — Other Ambulatory Visit: Payer: Self-pay | Admitting: Internal Medicine

## 2016-11-07 DIAGNOSIS — Z1231 Encounter for screening mammogram for malignant neoplasm of breast: Secondary | ICD-10-CM

## 2016-11-25 ENCOUNTER — Other Ambulatory Visit: Payer: Self-pay | Admitting: Internal Medicine

## 2016-11-29 ENCOUNTER — Ambulatory Visit
Admission: RE | Admit: 2016-11-29 | Discharge: 2016-11-29 | Disposition: A | Discharge status: Home / Self Care | Payer: Medicare Other | Source: Ambulatory Visit | Attending: Internal Medicine | Admitting: Internal Medicine

## 2016-11-29 DIAGNOSIS — Z1231 Encounter for screening mammogram for malignant neoplasm of breast: Secondary | ICD-10-CM | POA: Diagnosis not present

## 2017-03-14 ENCOUNTER — Ambulatory Visit: Payer: Medicare Other | Admitting: Internal Medicine

## 2017-03-24 ENCOUNTER — Other Ambulatory Visit (INDEPENDENT_AMBULATORY_CARE_PROVIDER_SITE_OTHER): Payer: Medicare Other

## 2017-03-24 DIAGNOSIS — E034 Atrophy of thyroid (acquired): Secondary | ICD-10-CM

## 2017-03-24 LAB — HEPATIC FUNCTION PANEL
ALT: 19 U/L (ref 0–35)
AST: 29 U/L (ref 0–37)
Albumin: 4 g/dL (ref 3.5–5.2)
Alkaline Phosphatase: 58 U/L (ref 39–117)
BILIRUBIN TOTAL: 0.5 mg/dL (ref 0.2–1.2)
Bilirubin, Direct: 0.1 mg/dL (ref 0.0–0.3)
Total Protein: 6.5 g/dL (ref 6.0–8.3)

## 2017-03-24 LAB — LIPID PANEL
CHOL/HDL RATIO: 3
CHOLESTEROL: 169 mg/dL (ref 0–200)
HDL: 67.1 mg/dL (ref >39.00)
LDL CALC: 91 mg/dL (ref 0–99)
NonHDL: 101.63
Triglycerides: 54 mg/dL (ref 0.0–149.0)
VLDL: 10.8 mg/dL (ref 0.0–40.0)

## 2017-03-24 LAB — T4, FREE: Free T4: 0.74 ng/dL (ref 0.60–1.60)

## 2017-03-24 LAB — TSH: TSH: 10.88 u[IU]/mL — ABNORMAL HIGH (ref 0.35–4.50)

## 2017-03-29 ENCOUNTER — Encounter: Payer: Self-pay | Admitting: Internal Medicine

## 2017-03-29 ENCOUNTER — Ambulatory Visit (INDEPENDENT_AMBULATORY_CARE_PROVIDER_SITE_OTHER): Payer: Medicare Other | Admitting: Internal Medicine

## 2017-03-29 DIAGNOSIS — I10 Essential (primary) hypertension: Secondary | ICD-10-CM

## 2017-03-29 DIAGNOSIS — E034 Atrophy of thyroid (acquired): Secondary | ICD-10-CM | POA: Diagnosis not present

## 2017-03-29 DIAGNOSIS — E538 Deficiency of other specified B group vitamins: Secondary | ICD-10-CM

## 2017-03-29 DIAGNOSIS — L570 Actinic keratosis: Secondary | ICD-10-CM

## 2017-03-29 DIAGNOSIS — K51818 Other ulcerative colitis with other complication: Secondary | ICD-10-CM

## 2017-03-29 MED ORDER — LEVOTHYROXINE SODIUM 50 MCG PO TABS: ORAL_TABLET | ORAL | 3 refills | Status: DC

## 2017-03-29 MED ORDER — CHOLECALCIFEROL 25 MCG (1000 UT) PO TABS: 1000.0000 [IU] | ORAL_TABLET | Freq: Every day | ORAL | 3 refills | Status: DC

## 2017-03-29 MED ORDER — SYNTHROID 50 MCG PO TABS: ORAL_TABLET | ORAL | 3 refills | Status: DC

## 2017-03-29 MED ORDER — PERINDOPRIL ERBUMINE 8 MG PO TABS: 8.0000 mg | ORAL_TABLET | Freq: Every day | ORAL | 3 refills | Status: DC

## 2017-03-29 MED ORDER — VERAPAMIL HCL 80 MG PO TABS: 80.0000 mg | ORAL_TABLET | Freq: Two times a day (BID) | ORAL | 3 refills | Status: DC

## 2017-03-29 MED ORDER — EZETIMIBE-SIMVASTATIN 10-20 MG PO TABS: 1.0000 | ORAL_TABLET | Freq: Every day | ORAL | 3 refills | Status: DC

## 2017-03-29 MED ORDER — TRIAMCINOLONE ACETONIDE 0.5 % EX CREA: TOPICAL_CREAM | Freq: Two times a day (BID) | CUTANEOUS | 2 refills | Status: DC

## 2017-03-29 NOTE — Assessment & Plan Note (Signed)
On Levothroid - Take 75 mcg a day Mon-Fri and 50 mcg a day on Sat and Sun Labs in 2 mo

## 2017-03-29 NOTE — Progress Notes (Signed)
Subjective:  Patient ID: Jasmine Mccormick, female    DOB: 1934-07-30  Age: 83 y.o. MRN: [id number]  CC: No chief complaint on file.   HPI Jasmine Mccormick presents for HTN, dyslipidemia, hypothyroidism f/u. C/o fatigue, wt gain  Outpatient Medications Prior to Visit  Medication Sig Dispense Refill  . Cholecalciferol 1000 UNITS tablet Take 1,000 Units by mouth daily.      . Cyanocobalamin (VITAMIN B-12) 500 MCG SUBL 1 sl qd 150 tablet 11  . ezetimibe-simvastatin (VYTORIN) 10-20 MG tablet Take 1 tablet by mouth at bedtime. 90 tablet 3  . levothyroxine (SYNTHROID) 50 MCG tablet Take 1 tablet (50 mcg total) by mouth daily. 90 tablet 3  . metoprolol succinate (TOPROL-XL) 25 MG 24 hr tablet TAKE 1 TABLET BY MOUTH DAILY AT NIGHT 90 tablet 2  . Multiple Vitamins-Minerals (MULTIVITAMIN,TX-MINERALS) tablet Take 1 tablet by mouth daily.      . perindopril (ACEON) 8 MG tablet Take 1 tablet (8 mg total) by mouth daily. 90 tablet 3  . verapamil (CALAN) 80 MG tablet Take 1 tablet (80 mg total) by mouth 2 (two) times daily. 180 tablet 3   No facility-administered medications prior to visit.     ROS Review of Systems  Constitutional: Negative for activity change, appetite change, chills, fatigue and unexpected weight change.  HENT: Negative for congestion, mouth sores and sinus pressure.   Eyes: Negative for visual disturbance.  Respiratory: Negative for cough and chest tightness.   Gastrointestinal: Negative for abdominal pain and nausea.  Genitourinary: Negative for difficulty urinating, frequency and vaginal pain.  Musculoskeletal: Positive for arthralgias. Negative for back pain and gait problem.  Skin: Negative for pallor and rash.  Neurological: Negative for dizziness, tremors, weakness, numbness and headaches.  Psychiatric/Behavioral: Negative for confusion and sleep disturbance.    Objective:  BP (!) 150/92 (BP Location: Left Arm, Patient Position: Sitting, Cuff Size: Normal)    Pulse 72   Temp 97.8 F (36.6 C) (Oral)   Ht 5\' 5"  (1.651 m)   Wt 161 lb (73 kg)   SpO2 98%   BMI 26.79 kg/m   BP Readings from Last 3 Encounters:  03/29/17 (!) 150/92  09/14/16 (!) 150/90  03/02/16 (!) 159/82    Wt Readings from Last 3 Encounters:  03/29/17 161 lb (73 kg)  09/14/16 160 lb 12 oz (72.9 kg)  03/02/16 156 lb (70.8 kg)    Physical Exam  Constitutional: She appears well-developed. No distress.  HENT:  Head: Normocephalic.  Right Ear: External ear normal.  Left Ear: External ear normal.  Nose: Nose normal.  Mouth/Throat: Oropharynx is clear and moist.  Eyes: Pupils are equal, round, and reactive to light. Conjunctivae are normal. Right eye exhibits no discharge. Left eye exhibits no discharge.  Neck: Normal range of motion. Neck supple. No JVD present. No tracheal deviation present. No thyromegaly present.  Cardiovascular: Normal rate, regular rhythm and normal heart sounds.   Pulmonary/Chest: No stridor. No respiratory distress. She has no wheezes.  Abdominal: Soft. Bowel sounds are normal. She exhibits no distension and no mass. There is no tenderness. There is no rebound and no guarding.  Musculoskeletal: She exhibits no edema or tenderness.  Lymphadenopathy:    She has no cervical adenopathy.  Neurological: She displays normal reflexes. No cranial nerve deficit. She exhibits normal muscle tone. Coordination normal.  Skin: No rash noted. No erythema.  Psychiatric: She has a normal mood and affect. Her behavior is normal. Judgment and thought content normal.  L nose, LLE AKs    Procedure Note :     Procedure : Cryosurgery   Indication:   Actinic keratosis(es)   Risks including unsuccessful procedure , bleeding, infection, bruising, scar, a need for a repeat  procedure and others were explained to the patient in detail as well as the benefits. Informed consent was obtained verbally.     1 lesion(s)  on L nose and 2 on LLE   was/were treated with liquid  nitrogen on a Q-tip in a usual fasion . Band-Aid was applied and antibiotic ointment was given for a later use.   Tolerated well. Complications none.      Lab Results  Component Value Date   WBC 6.2 09/08/2016   HGB 13.9 09/08/2016   HCT 41.3 09/08/2016   PLT 257.0 09/08/2016   GLUCOSE 98 09/08/2016   CHOL 169 03/24/2017   TRIG 54.0 03/24/2017   HDL 67.10 03/24/2017   LDLCALC 91 03/24/2017   ALT 19 03/24/2017   AST 29 03/24/2017   NA 139 09/08/2016   K 4.3 09/08/2016   CL 104 09/08/2016   CREATININE 0.75 09/08/2016   BUN 12 09/08/2016   CO2 29 09/08/2016   TSH 10.88 (H) 03/24/2017   HGBA1C 6.2 08/13/2013    Mm Screening Breast Tomo Bilateral  Result Date: 11/29/2016 CLINICAL DATA:  Screening. EXAM: 2D DIGITAL SCREENING BILATERAL MAMMOGRAM WITH CAD AND ADJUNCT TOMO COMPARISON:  Previous exam(s). ACR Breast Density Category c: The breast tissue is heterogeneously dense, which may obscure small masses. FINDINGS: There are no findings suspicious for malignancy. Images were processed with CAD. IMPRESSION: No mammographic evidence of malignancy. A result letter of this screening mammogram will be mailed directly to the patient. RECOMMENDATION: Screening mammogram in one year. (Code:SM-B-01Y) BI-RADS CATEGORY  1: Negative. Electronically Signed   By: Margarette Canada M.D.   On: 11/29/2016 16:42    Assessment & Plan:   There are no diagnoses linked to this encounter. I am having Jasmine Mccormick maintain her (multivitamin,tx-minerals), Cholecalciferol, Vitamin B-12, ezetimibe-simvastatin, verapamil, perindopril, levothyroxine, and metoprolol succinate.  No orders of the defined types were placed in this encounter.    Follow-up: No Follow-up on file.  Jasmine Kehr, MD

## 2017-03-29 NOTE — Assessment & Plan Note (Signed)
B 12

## 2017-03-29 NOTE — Assessment & Plan Note (Signed)
Ostomy

## 2017-03-29 NOTE — Patient Instructions (Signed)
   Postprocedure instructions :     Keep the wounds clean. You can wash them with liquid soap and water. Pat dry with gauze or a Kleenex tissue  Before applying antibiotic ointment and a Band-Aid.   You need to report immediately  if  any signs of infection develop.    

## 2017-03-29 NOTE — Assessment & Plan Note (Signed)
BP - good at home

## 2017-05-20 DIAGNOSIS — Z23 Encounter for immunization: Secondary | ICD-10-CM | POA: Diagnosis not present

## 2017-05-26 ENCOUNTER — Other Ambulatory Visit (INDEPENDENT_AMBULATORY_CARE_PROVIDER_SITE_OTHER): Payer: Medicare Other

## 2017-05-26 DIAGNOSIS — I10 Essential (primary) hypertension: Secondary | ICD-10-CM

## 2017-05-26 DIAGNOSIS — E034 Atrophy of thyroid (acquired): Secondary | ICD-10-CM | POA: Diagnosis not present

## 2017-05-26 LAB — BASIC METABOLIC PANEL
BUN: 11 mg/dL (ref 6–23)
CALCIUM: 8.8 mg/dL (ref 8.4–10.5)
CO2: 28 mEq/L (ref 19–32)
CREATININE: 0.77 mg/dL (ref 0.40–1.20)
Chloride: 102 mEq/L (ref 96–112)
GFR: 76.47 mL/min (ref >60.00)
Glucose, Bld: 99 mg/dL (ref 70–99)
POTASSIUM: 4.6 meq/L (ref 3.5–5.1)
Sodium: 138 mEq/L (ref 135–145)

## 2017-05-26 LAB — T4, FREE: FREE T4: 0.98 ng/dL (ref 0.60–1.60)

## 2017-05-26 LAB — TSH: TSH: 1.75 u[IU]/mL (ref 0.35–4.50)

## 2017-06-08 ENCOUNTER — Telehealth: Payer: Self-pay | Admitting: Internal Medicine

## 2017-06-08 MED ORDER — SYNTHROID 50 MCG PO TABS: ORAL_TABLET | ORAL | 11 refills | Status: DC

## 2017-06-08 NOTE — Telephone Encounter (Signed)
Pt returning call o her uestion about her medication in her lab work. Please call back in regard

## 2017-06-08 NOTE — Telephone Encounter (Signed)
Unable to reach pt, see lab results

## 2017-08-02 DIAGNOSIS — I708 Atherosclerosis of other arteries: Secondary | ICD-10-CM | POA: Diagnosis not present

## 2017-08-02 DIAGNOSIS — H35033 Hypertensive retinopathy, bilateral: Secondary | ICD-10-CM | POA: Diagnosis not present

## 2017-08-02 DIAGNOSIS — H35361 Drusen (degenerative) of macula, right eye: Secondary | ICD-10-CM | POA: Diagnosis not present

## 2017-08-02 DIAGNOSIS — H40013 Open angle with borderline findings, low risk, bilateral: Secondary | ICD-10-CM | POA: Diagnosis not present

## 2017-08-05 ENCOUNTER — Other Ambulatory Visit: Payer: Self-pay | Admitting: Internal Medicine

## 2017-08-17 ENCOUNTER — Ambulatory Visit (INDEPENDENT_AMBULATORY_CARE_PROVIDER_SITE_OTHER): Payer: Medicare Other | Admitting: Family Medicine

## 2017-08-17 ENCOUNTER — Other Ambulatory Visit (INDEPENDENT_AMBULATORY_CARE_PROVIDER_SITE_OTHER): Payer: Medicare Other

## 2017-08-17 ENCOUNTER — Encounter: Payer: Self-pay | Admitting: Family Medicine

## 2017-08-17 VITALS — BP 142/84 | HR 71 | Temp 97.6°F | Ht 65.0 in | Wt 161.0 lb

## 2017-08-17 DIAGNOSIS — R300 Dysuria: Secondary | ICD-10-CM

## 2017-08-17 LAB — URINALYSIS
BILIRUBIN URINE: NEGATIVE
HGB URINE DIPSTICK: NEGATIVE
KETONES UR: NEGATIVE
LEUKOCYTES UA: NEGATIVE
NITRITE: NEGATIVE
Specific Gravity, Urine: 1.01 (ref 1.000–1.030)
TOTAL PROTEIN, URINE-UPE24: NEGATIVE
Urine Glucose: NEGATIVE
Urobilinogen, UA: 0.2 (ref 0.0–1.0)
pH: 6.5 (ref 5.0–8.0)

## 2017-08-17 NOTE — Progress Notes (Signed)
Jasmine Mccormick - 84 y.o. female MRN [id number]  Date of birth: [birth year]  SUBJECTIVE:  Including CC & ROS.  Chief Complaint  Patient presents with  . Dysuria    Jasmine Mccormick is a 84 y.o. female that is presenting with trouble urinating. She has been having a burning sensation for five days. Admits to frequent urination. Denies chills or fever. The symptoms have been inconsistent. She reports feeling better some days and then symptoms return. Denies any blood in the urine. Has a distant history of a previous urinary infection. No fevers or chills. No back pain. Has been taking cherry juice with some improvement.     Review of Systems  Constitutional: Negative for fever.  Respiratory: Negative for cough.   Cardiovascular: Negative for chest pain.  Gastrointestinal: Negative for abdominal pain.  Genitourinary: Positive for dysuria and frequency. Negative for hematuria.  Musculoskeletal: Negative for back pain.  Skin: Negative for color change.    HISTORY: Past Medical, Surgical, Social, and Family History Reviewed & Updated per EMR.   Pertinent Historical Findings include:  Past Medical History:  Diagnosis Date  . HTN (hypertension)    Normal at home  . Hyperlipidemia   . Ileostomy in place [facility])   . Meniere disease 1996  . Osteoporosis   . UC (ulcerative colitis) [facility])     Past Surgical History:  Procedure Laterality Date  . COLECTOMY    . Ear Shunt for Menier's    . ILEOSTOMY    . PROCTECTOMY      No Known Allergies  Family History  Problem Relation Age of Onset  . Hypertension Other   . Heart disease Other   . Breast cancer Sister      Social History   Socioeconomic History  . Marital status: Married    Spouse name: Not on file  . Number of children: Not on file  . Years of education: Not on file  . Highest education level: Not on file  Social Needs  . Financial resource strain: Not on file  . Food insecurity - worry: Not on file  . Food  insecurity - inability: Not on file  . Transportation needs - medical: Not on file  . Transportation needs - non-medical: Not on file  Occupational History  . Occupation: Retired  Tobacco Use  . Smoking status: Never Smoker  . Smokeless tobacco: Never Used  Substance and Sexual Activity  . Alcohol use: No  . Drug use: No  . Sexual activity: Not Currently  Other Topics Concern  . Not on file  Social History Narrative  . Not on file     PHYSICAL EXAM:  VS: BP (!) 142/84 (BP Location: Left Arm, Patient Position: Sitting, Cuff Size: Normal)   Pulse 71   Temp 97.6 F (36.4 C) (Oral)   Ht 5' 5"  (1.651 m)   Wt 161 lb (73 kg)   SpO2 95%   BMI 26.79 kg/m  Physical Exam Gen: NAD, alert, cooperative with exam, well-appearing ENT: normal lips, normal nasal mucosa,  Eye: normal EOM, normal conjunctiva and lids CV:  no edema, +2 pedal pulses   Resp: no accessory muscle use, non-labored,  GI:  ileostomy presents, no hernia, no suprapubic tenderness.   Skin: no rashes, no areas of induration  Neuro: normal tone, normal sensation to touch Psych:  normal insight, alert and oriented MSK: normal gait, normal strength      ASSESSMENT & PLAN:   Dysuria Possible for bacterial. No frequent history  of infection - urinalysis and urine culture.

## 2017-08-18 ENCOUNTER — Telehealth: Payer: Self-pay | Admitting: Internal Medicine

## 2017-08-18 DIAGNOSIS — R300 Dysuria: Secondary | ICD-10-CM | POA: Insufficient documentation

## 2017-08-18 MED ORDER — FLUCONAZOLE 150 MG PO TABS: 150.0000 mg | ORAL_TABLET | Freq: Once | ORAL | 0 refills | Status: AC

## 2017-08-18 NOTE — Telephone Encounter (Signed)
Copied from [place] [id number]. Topic: Quick Communication - See Telephone Encounter >> Aug 18, 2017  8:54 AM Ahmed Prima L wrote: CRM for notification. See Telephone encounter for:   08/18/17.  Pt said she was in the office yesterday & saw Dr. Raeford Razor, he told her to call back if she was still having symptoms. She said she had a urine test. She said he would call her something in if she still had issues.   [id number] - [address]

## 2017-08-18 NOTE — Assessment & Plan Note (Signed)
Possible for bacterial. No frequent history of infection - urinalysis and urine culture.

## 2017-08-18 NOTE — Telephone Encounter (Signed)
Diflucan provided.   Rosemarie Ax, MD [facility] 08/18/2017, 4:24 PM

## 2017-08-18 NOTE — Telephone Encounter (Signed)
Patient informed. 

## 2017-08-20 LAB — URINE CULTURE
MICRO NUMBER:: 90008656
SPECIMEN QUALITY:: ADEQUATE

## 2017-08-21 ENCOUNTER — Telehealth: Payer: Self-pay | Admitting: Family Medicine

## 2017-08-21 MED ORDER — CIPROFLOXACIN HCL 500 MG PO TABS: 500.0000 mg | ORAL_TABLET | Freq: Every day | ORAL | 0 refills | Status: AC

## 2017-08-21 NOTE — Telephone Encounter (Signed)
cipro was sent in.   Rosemarie Ax, MD [facility] 08/21/2017, 11:36 AM

## 2017-08-21 NOTE — Telephone Encounter (Signed)
Patient notified

## 2017-08-21 NOTE — Telephone Encounter (Signed)
Left VM for patient. If she calls back please have her speak with a nurse/CMA and inform that her urine culture did grow a bacteria but the sample size wasn't given. I may need to treat her with an antibiotic if she is still having symptoms.   If any questions then please take the best time and phone number to call and I will try to call her back.   Rosemarie Ax, MD [facility] 08/21/2017, 8:26 AM

## 2017-08-21 NOTE — Telephone Encounter (Signed)
Patient states she is still having symptoms. [place]

## 2017-08-21 NOTE — Addendum Note (Signed)
Addended by: Rosemarie Ax on: 08/21/2017 11:36 AM   Modules accepted: Orders

## 2017-10-13 ENCOUNTER — Other Ambulatory Visit (INDEPENDENT_AMBULATORY_CARE_PROVIDER_SITE_OTHER): Payer: Medicare Other

## 2017-10-13 DIAGNOSIS — E034 Atrophy of thyroid (acquired): Secondary | ICD-10-CM

## 2017-10-13 LAB — TSH: TSH: 1.01 u[IU]/mL (ref 0.35–4.50)

## 2017-10-13 LAB — T4, FREE: Free T4: 0.78 ng/dL (ref 0.60–1.60)

## 2017-10-18 ENCOUNTER — Ambulatory Visit (INDEPENDENT_AMBULATORY_CARE_PROVIDER_SITE_OTHER)
Admission: RE | Admit: 2017-10-18 | Discharge: 2017-10-18 | Disposition: A | Discharge status: Home / Self Care | Payer: Medicare Other | Source: Ambulatory Visit | Attending: Internal Medicine | Admitting: Internal Medicine

## 2017-10-18 ENCOUNTER — Encounter: Payer: Self-pay | Admitting: Internal Medicine

## 2017-10-18 ENCOUNTER — Ambulatory Visit (INDEPENDENT_AMBULATORY_CARE_PROVIDER_SITE_OTHER): Payer: Medicare Other | Admitting: Internal Medicine

## 2017-10-18 VITALS — BP 164/82 | HR 68 | Temp 97.8°F | Ht 65.0 in | Wt 162.0 lb

## 2017-10-18 DIAGNOSIS — M810 Age-related osteoporosis without current pathological fracture: Secondary | ICD-10-CM | POA: Diagnosis not present

## 2017-10-18 DIAGNOSIS — I10 Essential (primary) hypertension: Secondary | ICD-10-CM

## 2017-10-18 DIAGNOSIS — N959 Unspecified menopausal and perimenopausal disorder: Secondary | ICD-10-CM

## 2017-10-18 DIAGNOSIS — E538 Deficiency of other specified B group vitamins: Secondary | ICD-10-CM | POA: Diagnosis not present

## 2017-10-18 DIAGNOSIS — E034 Atrophy of thyroid (acquired): Secondary | ICD-10-CM | POA: Diagnosis not present

## 2017-10-18 NOTE — Patient Instructions (Signed)
MC well w/Jill

## 2017-10-18 NOTE — Assessment & Plan Note (Addendum)
Vit D DEXA

## 2017-10-18 NOTE — Assessment & Plan Note (Addendum)
Vit D 

## 2017-10-18 NOTE — Assessment & Plan Note (Signed)
Chronic Metoprolol, Verapamil, Aceon

## 2017-10-18 NOTE — Assessment & Plan Note (Signed)
levothroid

## 2017-10-18 NOTE — Progress Notes (Signed)
Subjective:  Patient ID: Jasmine Mccormick, female    DOB: July 23, 1934  Age: 84 y.o. MRN: [id number]  CC: No chief complaint on file.   HPI Jasmine Mccormick presents for hypothyroidism, HTN, dyslipidemia f/u  Outpatient Medications Prior to Visit  Medication Sig Dispense Refill  . Cholecalciferol 1000 units tablet Take 1 tablet (1,000 Units total) by mouth daily. 100 tablet 3  . Cyanocobalamin (VITAMIN B-12) 500 MCG SUBL 1 sl qd 150 tablet 11  . ezetimibe-simvastatin (VYTORIN) 10-20 MG tablet Take 1 tablet by mouth at bedtime. 90 tablet 3  . metoprolol succinate (TOPROL-XL) 25 MG 24 hr tablet TAKE 1 TABLET BY MOUTH DAILY AT NIGHT 90 tablet 3  . Multiple Vitamins-Minerals (MULTIVITAMIN,TX-MINERALS) tablet Take 1 tablet by mouth daily.      . perindopril (ACEON) 8 MG tablet Take 1 tablet (8 mg total) by mouth daily. 90 tablet 3  . SYNTHROID 50 MCG tablet Take 75 mcg a day Mon-Fri and 50 mcg a day on Sat and Sun 45 tablet 11  . triamcinolone cream (KENALOG) 0.5 % Apply topically 2 (two) times daily. 30 g 2  . verapamil (CALAN) 80 MG tablet Take 1 tablet (80 mg total) by mouth 2 (two) times daily. 180 tablet 3   No facility-administered medications prior to visit.     ROS Review of Systems  Constitutional: Negative for activity change, appetite change, chills, fatigue and unexpected weight change.  HENT: Negative for congestion, mouth sores and sinus pressure.   Eyes: Negative for visual disturbance.  Respiratory: Negative for cough and chest tightness.   Gastrointestinal: Negative for abdominal pain and nausea.  Genitourinary: Negative for difficulty urinating, frequency and vaginal pain.  Musculoskeletal: Negative for back pain and gait problem.  Skin: Negative for pallor and rash.  Neurological: Negative for dizziness, tremors, weakness, numbness and headaches.  Psychiatric/Behavioral: Negative for confusion and sleep disturbance.    Objective:  BP (!) 164/82 (BP Location:  Left Arm, Patient Position: Sitting, Cuff Size: Normal)   Pulse 68   Temp 97.8 F (36.6 C) (Oral)   Ht 5' 5"  (1.651 m)   Wt 162 lb (73.5 kg)   SpO2 99%   BMI 26.96 kg/m   BP Readings from Last 3 Encounters:  10/18/17 (!) 164/82  08/17/17 (!) 142/84  03/29/17 (!) 150/92    Wt Readings from Last 3 Encounters:  10/18/17 162 lb (73.5 kg)  08/17/17 161 lb (73 kg)  03/29/17 161 lb (73 kg)    Physical Exam  Constitutional: She appears well-developed. No distress.  HENT:  Head: Normocephalic.  Right Ear: External ear normal.  Left Ear: External ear normal.  Nose: Nose normal.  Mouth/Throat: Oropharynx is clear and moist.  Eyes: Conjunctivae are normal. Pupils are equal, round, and reactive to light. Right eye exhibits no discharge. Left eye exhibits no discharge.  Neck: Normal range of motion. Neck supple. No JVD present. No tracheal deviation present. No thyromegaly present.  Cardiovascular: Normal rate, regular rhythm and normal heart sounds.  Pulmonary/Chest: No stridor. No respiratory distress. She has no wheezes.  Abdominal: Soft. Bowel sounds are normal. She exhibits no distension and no mass. There is no tenderness. There is no rebound and no guarding.  Musculoskeletal: She exhibits tenderness. She exhibits no edema.  Lymphadenopathy:    She has no cervical adenopathy.  Neurological: She displays normal reflexes. No cranial nerve deficit. She exhibits normal muscle tone. Coordination normal.  Skin: No rash noted. No erythema.  Psychiatric: She has  a normal mood and affect. Her behavior is normal. Judgment and thought content normal.    Lab Results  Component Value Date   WBC 6.2 09/08/2016   HGB 13.9 09/08/2016   HCT 41.3 09/08/2016   PLT 257.0 09/08/2016   GLUCOSE 99 05/26/2017   CHOL 169 03/24/2017   TRIG 54.0 03/24/2017   HDL 67.10 03/24/2017   LDLCALC 91 03/24/2017   ALT 19 03/24/2017   AST 29 03/24/2017   NA 138 05/26/2017   K 4.6 05/26/2017   CL 102  05/26/2017   CREATININE 0.77 05/26/2017   BUN 11 05/26/2017   CO2 28 05/26/2017   TSH 1.01 10/13/2017   HGBA1C 6.2 08/13/2013    Mm Screening Breast Tomo Bilateral  Result Date: 11/29/2016 CLINICAL DATA:  Screening. EXAM: 2D DIGITAL SCREENING BILATERAL MAMMOGRAM WITH CAD AND ADJUNCT TOMO COMPARISON:  Previous exam(s). ACR Breast Density Category c: The breast tissue is heterogeneously dense, which may obscure small masses. FINDINGS: There are no findings suspicious for malignancy. Images were processed with CAD. IMPRESSION: No mammographic evidence of malignancy. A result letter of this screening mammogram will be mailed directly to the patient. RECOMMENDATION: Screening mammogram in one year. (Code:SM-B-01Y) BI-RADS CATEGORY  1: Negative. Electronically Signed   By: Margarette Canada M.D.   On: 11/29/2016 16:42    Assessment & Plan:   There are no diagnoses linked to this encounter. I am having Jasmine Mccormick maintain her (multivitamin,tx-minerals), Vitamin B-12, triamcinolone cream, Cholecalciferol, verapamil, perindopril, ezetimibe-simvastatin, SYNTHROID, and metoprolol succinate.  No orders of the defined types were placed in this encounter.    Follow-up: No Follow-up on file.  Walker Kehr, MD

## 2017-11-30 ENCOUNTER — Ambulatory Visit (INDEPENDENT_AMBULATORY_CARE_PROVIDER_SITE_OTHER): Payer: Medicare Other | Admitting: *Deleted

## 2017-11-30 VITALS — BP 158/74 | HR 64 | Resp 18 | Ht 65.0 in | Wt 162.0 lb

## 2017-11-30 DIAGNOSIS — Z0000 Encounter for general adult medical examination without abnormal findings: Secondary | ICD-10-CM

## 2017-11-30 NOTE — Progress Notes (Addendum)
Subjective:   Jasmine Mccormick is a 84 y.o. female who presents for Medicare Annual (Subsequent) preventive examination.  Review of Systems:  No ROS.  Medicare Wellness Visit. Additional risk factors are reflected in the social history.  Cardiac Risk Factors include: advanced age (>54men, >8 women);dyslipidemia;hypertension Sleep patterns: gets up 1 times nightly to void and sleeps 7 hours nightly.    Home Safety/Smoke Alarms: Feels safe in home. Smoke alarms in place.  Living environment; residence and Firearm Safety: 1-story house/ trailer, no firearms. Lives with husband, no needs for DME, good support system Seat Belt Safety/Bike Helmet: Wears seat belt.     Objective:     Vitals: BP (!) 158/74   Pulse 64   Resp 18   Ht 5\' 5"  (1.651 m)   Wt 162 lb (73.5 kg)   SpO2 99%   BMI 26.96 kg/m   Body mass index is 26.96 kg/m.  Advanced Directives 11/30/2017  Does Patient Have a Medical Advance Directive? No;Yes  Type of Paramedic of [place];Living will  Copy of [place] in Chart? No - copy requested    Tobacco Social History   Tobacco Use  Smoking Status Never Smoker  Smokeless Tobacco Never Used     Counseling given: Not Answered  Past Medical History:  Diagnosis Date  . HTN (hypertension)    Normal at home  . Hyperlipidemia   . Ileostomy in place [facility])   . Meniere disease 1996  . Osteoporosis   . UC (ulcerative colitis) [facility])    Past Surgical History:  Procedure Laterality Date  . COLECTOMY    . Ear Shunt for Menier's    . ILEOSTOMY    . PROCTECTOMY     Family History  Problem Relation Age of Onset  . Hypertension Other   . Heart disease Other   . Breast cancer Sister    Social History   Socioeconomic History  . Marital status: Married    Spouse name: Not on file  . Number of children: Not on file  . Years of education: Not on file  . Highest education level: Not on file  Occupational History    . Occupation: Retired  Scientific laboratory technician  . Financial resource strain: Not hard at all  . Food insecurity:    Worry: Never true    Inability: Never true  . Transportation needs:    Medical: No    Non-medical: No  Tobacco Use  . Smoking status: Never Smoker  . Smokeless tobacco: Never Used  Substance and Sexual Activity  . Alcohol use: No  . Drug use: No  . Sexual activity: Not Currently  Lifestyle  . Physical activity:    Days per week: 5 days    Minutes per session: 30 min  . Stress: Only a little  Relationships  . Social connections:    Talks on phone: More than three times a week    Gets together: More than three times a week    Attends religious service: More than 4 times per year    Active member of club or organization: Yes    Attends meetings of clubs or organizations: More than 4 times per year    Relationship status: Married  Other Topics Concern  . Not on file  Social History Narrative  . Not on file    Outpatient Encounter Medications as of 11/30/2017  Medication Sig  . Cholecalciferol 1000 units tablet Take 1 tablet (1,000 Units total)  by mouth daily.  . Cyanocobalamin (VITAMIN B-12) 500 MCG SUBL 1 sl qd  . ezetimibe-simvastatin (VYTORIN) 10-20 MG tablet Take 1 tablet by mouth at bedtime.  . metoprolol succinate (TOPROL-XL) 25 MG 24 hr tablet TAKE 1 TABLET BY MOUTH DAILY AT NIGHT  . Multiple Vitamins-Minerals (MULTIVITAMIN,TX-MINERALS) tablet Take 1 tablet by mouth daily.    . perindopril (ACEON) 8 MG tablet Take 1 tablet (8 mg total) by mouth daily.  [name] SYNTHROID 50 MCG tablet Take 75 mcg a day Mon-Fri and 50 mcg a day on Sat and Sun  . triamcinolone cream (KENALOG) 0.5 % Apply topically 2 (two) times daily.  . verapamil (CALAN) 80 MG tablet Take 1 tablet (80 mg total) by mouth 2 (two) times daily.   No facility-administered encounter medications on file as of 11/30/2017.     Activities of Daily Living In your present state of health, do you have any  difficulty performing the following activities: 11/30/2017  Hearing? N  Vision? N  Difficulty concentrating or making decisions? N  Walking or climbing stairs? N  Dressing or bathing? N  Doing errands, shopping? N  Preparing Food and eating ? N  Using the Toilet? N  In the past six months, have you accidently leaked urine? N  Do you have problems with loss of bowel control? N  Managing your Medications? N  Managing your Finances? N  Housekeeping or managing your Housekeeping? N  Some recent data might be hidden    Patient Care Team: Plotnikov, Evie Lacks, MD as PCP - General    Assessment:   This is a routine wellness examination for Jasmine Mccormick. Physical assessment deferred to PCP.   Exercise Activities and Dietary recommendations Current Exercise Habits: Home exercise routine, Type of exercise: walking, Time (Minutes): 30, Frequency (Times/Week): 5, Weekly Exercise (Minutes/Week): 150, Intensity: Mild, Exercise limited by: None identified  Diet (meal preparation, eat out, water intake, caffeinated beverages, dairy products, fruits and vegetables): in general, a "healthy" diet  , well balanced   Reviewed heart healthy diet,encouraged patient to increase daily water intake.  Goals    . Patient Stated     Stay as healthy and as independent as possible. Continue to enjoy life and stay socially active.       Fall Risk Fall Risk  11/30/2017 10/18/2017 09/14/2016 04/08/2016 03/03/2015  Falls in the past year? No No No No Yes  Comment - - - Emmi Telephone Survey: data to providers prior to load -  Number falls in past yr: - - - - 1  Injury with Fall? - - - - No    Depression Screen PHQ 2/9 Scores 11/30/2017 10/18/2017 09/14/2016 03/06/2015  PHQ - 2 Score 0 0 0 0  PHQ- 9 Score 2 - - -     Cognitive Function MMSE - Mini Mental State Exam 11/30/2017  Orientation to time 5  Orientation to Place 5  Registration 3  Attention/ Calculation 5  Recall 2  Language- name 2 objects 2    Language- repeat 1  Language- follow 3 step command 3  Language- read & follow direction 1  Write a sentence 1  Copy design 1  Total score 29        Immunization History  Administered Date(s) Administered  . Influenza Split 05/30/2011, 05/15/2012  . Influenza Whole 05/15/2006, 05/04/2010  . Influenza, High Dose Seasonal PF 05/16/2015, 05/31/2016  . Influenza-Unspecified 05/15/2013, 05/15/2014  . Pneumococcal Conjugate-13 08/01/2013  . Pneumococcal Polysaccharide-23 07/04/2012  . Td 07/04/2012  .  Zoster Recombinat (Shingrix) 09/29/2016   Screening Tests Health Maintenance  Topic Date Due  . INFLUENZA VACCINE  03/15/2018  . TETANUS/TDAP  07/04/2022  . DEXA SCAN  Completed  . PNA vac Low Risk Adult  Completed      Plan:    Continue doing brain stimulating activities (puzzles, reading, adult coloring books, staying active) to keep memory sharp.   Continue to eat heart healthy diet (full of fruits, vegetables, whole grains, lean protein, water--limit salt, fat, and sugar intake) and increase physical activity as tolerated.  I have personally reviewed and noted the following in the patient's chart:   . Medical and social history . Use of alcohol, tobacco or illicit drugs  . Current medications and supplements . Functional ability and status . Nutritional status . Physical activity . Advanced directives . List of other physicians . Vitals . Screenings to include cognitive, depression, and falls . Referrals and appointments  In addition, I have reviewed and discussed with patient certain preventive protocols, quality metrics, and best practice recommendations. A written personalized care plan for preventive services as well as general preventive health recommendations were provided to patient.     Michiel Cowboy, RN  11/30/2017  Medical screening examination/treatment/procedure(s) were performed by non-physician practitioner and as supervising physician I was immediately  available for consultation/collaboration. I agree with above. Lew Dawes, MD

## 2017-11-30 NOTE — Patient Instructions (Signed)
Continue doing brain stimulating activities (puzzles, reading, adult coloring books, staying active) to keep memory sharp.   Continue to eat heart healthy diet (full of fruits, vegetables, whole grains, lean protein, water--limit salt, fat, and sugar intake) and increase physical activity as tolerated.   [name] , Thank you for taking time to come for your Medicare Wellness Visit. I appreciate your ongoing commitment to your health goals. Please review the following plan we discussed and let me know if I can assist you in the future.   These are the goals we discussed: Goals    . Patient Stated     Stay as healthy and as independent as possible. Continue to enjoy life and stay socially active.       This is a list of the screening recommended for you and due dates:  Health Maintenance  Topic Date Due  . Flu Shot  03/15/2018  . Tetanus Vaccine  07/04/2022  . DEXA scan (bone density measurement)  Completed  . Pneumonia vaccines  Completed   Health Maintenance, Female Adopting a healthy lifestyle and getting preventive care can go a long way to promote health and wellness. Talk with your health care provider about what schedule of regular examinations is right for you. This is a good chance for you to check in with your provider about disease prevention and staying healthy. In between checkups, there are plenty of things you can do on your own. Experts have done a lot of research about which lifestyle changes and preventive measures are most likely to keep you healthy. Ask your health care provider for more information. Weight and diet Eat a healthy diet  Be sure to include plenty of vegetables, fruits, low-fat dairy products, and lean protein.  Do not eat a lot of foods high in solid fats, added sugars, or salt.  Get regular exercise. This is one of the most important things you can do for your health. ? Most adults should exercise for at least 150 minutes each week. The exercise  should increase your heart rate and make you sweat (moderate-intensity exercise). ? Most adults should also do strengthening exercises at least twice a week. This is in addition to the moderate-intensity exercise.  Maintain a healthy weight  Body mass index (BMI) is a measurement that can be used to identify possible weight problems. It estimates body fat based on height and weight. Your health care provider can help determine your BMI and help you achieve or maintain a healthy weight.  For females 85 years of age and older: ? A BMI below 18.5 is considered underweight. ? A BMI of 18.5 to 24.9 is normal. ? A BMI of 25 to 29.9 is considered overweight. ? A BMI of 30 and above is considered obese.  Watch levels of cholesterol and blood lipids  You should start having your blood tested for lipids and cholesterol at 82 years of age, then have this test every 5 years.  You may need to have your cholesterol levels checked more often if: ? Your lipid or cholesterol levels are high. ? You are older than 82 years of age. ? You are at high risk for heart disease.  Cancer screening Lung Cancer  Lung cancer screening is recommended for adults 58-27 years old who are at high risk for lung cancer because of a history of smoking.  A yearly low-dose CT scan of the lungs is recommended for people who: ? Currently smoke. ? Have quit within the past  15 years. ? Have at least a 30-pack-year history of smoking. A pack year is smoking an average of one pack of cigarettes a day for 1 year.  Yearly screening should continue until it has been 15 years since you quit.  Yearly screening should stop if you develop a health problem that would prevent you from having lung cancer treatment.  Breast Cancer  Practice breast self-awareness. This means understanding how your breasts normally appear and feel.  It also means doing regular breast self-exams. Let your health care provider know about any changes, no  matter how small.  If you are in your 20s or 30s, you should have a clinical breast exam (CBE) by a health care provider every 1-3 years as part of a regular health exam.  If you are 92 or older, have a CBE every year. Also consider having a breast X-ray (mammogram) every year.  If you have a family history of breast cancer, talk to your health care provider about genetic screening.  If you are at high risk for breast cancer, talk to your health care provider about having an MRI and a mammogram every year.  Breast cancer gene (BRCA) assessment is recommended for women who have family members with BRCA-related cancers. BRCA-related cancers include: ? Breast. ? Ovarian. ? Tubal. ? Peritoneal cancers.  Results of the assessment will determine the need for genetic counseling and BRCA1 and BRCA2 testing.  Cervical Cancer Your health care provider may recommend that you be screened regularly for cancer of the pelvic organs (ovaries, uterus, and vagina). This screening involves a pelvic examination, including checking for microscopic changes to the surface of your cervix (Pap test). You may be encouraged to have this screening done every 3 years, beginning at age 32.  For women ages 72-65, health care providers may recommend pelvic exams and Pap testing every 3 years, or they may recommend the Pap and pelvic exam, combined with testing for human papilloma virus (HPV), every 5 years. Some types of HPV increase your risk of cervical cancer. Testing for HPV may also be done on women of any age with unclear Pap test results.  Other health care providers may not recommend any screening for nonpregnant women who are considered low risk for pelvic cancer and who do not have symptoms. Ask your health care provider if a screening pelvic exam is right for you.  If you have had past treatment for cervical cancer or a condition that could lead to cancer, you need Pap tests and screening for cancer for at least  20 years after your treatment. If Pap tests have been discontinued, your risk factors (such as having a new sexual partner) need to be reassessed to determine if screening should resume. Some women have medical problems that increase the chance of getting cervical cancer. In these cases, your health care provider may recommend more frequent screening and Pap tests.  Colorectal Cancer  This type of cancer can be detected and often prevented.  Routine colorectal cancer screening usually begins at 82 years of age and continues through 82 years of age.  Your health care provider may recommend screening at an earlier age if you have risk factors for colon cancer.  Your health care provider may also recommend using home test kits to check for hidden blood in the stool.  A small camera at the end of a tube can be used to examine your colon directly (sigmoidoscopy or colonoscopy). This is done to check for the earliest  forms of colorectal cancer.  Routine screening usually begins at age 27.  Direct examination of the colon should be repeated every 5-10 years through 82 years of age. However, you may need to be screened more often if early forms of precancerous polyps or small growths are found.  Skin Cancer  Check your skin from head to toe regularly.  Tell your health care provider about any new moles or changes in moles, especially if there is a change in a mole's shape or color.  Also tell your health care provider if you have a mole that is larger than the size of a pencil eraser.  Always use sunscreen. Apply sunscreen liberally and repeatedly throughout the day.  Protect yourself by wearing long sleeves, pants, a wide-brimmed hat, and sunglasses whenever you are outside.  Heart disease, diabetes, and high blood pressure  High blood pressure causes heart disease and increases the risk of stroke. High blood pressure is more likely to develop in: ? People who have blood pressure in the  high end of the normal range (130-139/85-89 mm Hg). ? People who are overweight or obese. ? People who are African American.  If you are 90-20 years of age, have your blood pressure checked every 3-5 years. If you are 52 years of age or older, have your blood pressure checked every year. You should have your blood pressure measured twice-once when you are at a hospital or clinic, and once when you are not at a hospital or clinic. Record the average of the two measurements. To check your blood pressure when you are not at a hospital or clinic, you can use: ? An automated blood pressure machine at a pharmacy. ? A home blood pressure monitor.  If you are between 38 years and 48 years old, ask your health care provider if you should take aspirin to prevent strokes.  Have regular diabetes screenings. This involves taking a blood sample to check your fasting blood sugar level. ? If you are at a normal weight and have a low risk for diabetes, have this test once every three years after 82 years of age. ? If you are overweight and have a high risk for diabetes, consider being tested at a younger age or more often. Preventing infection Hepatitis B  If you have a higher risk for hepatitis B, you should be screened for this virus. You are considered at high risk for hepatitis B if: ? You were born in a country where hepatitis B is common. Ask your health care provider which countries are considered high risk. ? Your parents were born in a high-risk country, and you have not been immunized against hepatitis B (hepatitis B vaccine). ? You have HIV or AIDS. ? You use needles to inject street drugs. ? You live with someone who has hepatitis B. ? You have had sex with someone who has hepatitis B. ? You get hemodialysis treatment. ? You take certain medicines for conditions, including cancer, organ transplantation, and autoimmune conditions.  Hepatitis C  Blood testing is recommended for: ? Everyone born  from [birth year] through 1963. ? Anyone with known risk factors for hepatitis C.  Sexually transmitted infections (STIs)  You should be screened for sexually transmitted infections (STIs) including gonorrhea and chlamydia if: ? You are sexually active and are younger than 82 years of age. ? You are older than 82 years of age and your health care provider tells you that you are at risk for this type of  infection. ? Your sexual activity has changed since you were last screened and you are at an increased risk for chlamydia or gonorrhea. Ask your health care provider if you are at risk.  If you do not have HIV, but are at risk, it may be recommended that you take a prescription medicine daily to prevent HIV infection. This is called pre-exposure prophylaxis (PrEP). You are considered at risk if: ? You are sexually active and do not regularly use condoms or know the HIV status of your partner(s). ? You take drugs by injection. ? You are sexually active with a partner who has HIV.  Talk with your health care provider about whether you are at high risk of being infected with HIV. If you choose to begin PrEP, you should first be tested for HIV. You should then be tested every 3 months for as long as you are taking PrEP. Pregnancy  If you are premenopausal and you may become pregnant, ask your health care provider about preconception counseling.  If you may become pregnant, take 400 to 800 micrograms (mcg) of folic acid every day.  If you want to prevent pregnancy, talk to your health care provider about birth control (contraception). Osteoporosis and menopause  Osteoporosis is a disease in which the bones lose minerals and strength with aging. This can result in serious bone fractures. Your risk for osteoporosis can be identified using a bone density scan.  If you are 24 years of age or older, or if you are at risk for osteoporosis and fractures, ask your health care provider if you should be  screened.  Ask your health care provider whether you should take a calcium or vitamin D supplement to lower your risk for osteoporosis.  Menopause may have certain physical symptoms and risks.  Hormone replacement therapy may reduce some of these symptoms and risks. Talk to your health care provider about whether hormone replacement therapy is right for you. Follow these instructions at home:  Schedule regular health, dental, and eye exams.  Stay current with your immunizations.  Do not use any tobacco products including cigarettes, chewing tobacco, or electronic cigarettes.  If you are pregnant, do not drink alcohol.  If you are breastfeeding, limit how much and how often you drink alcohol.  Limit alcohol intake to no more than 1 drink per day for nonpregnant women. One drink equals 12 ounces of beer, 5 ounces of wine, or 1 ounces of hard liquor.  Do not use street drugs.  Do not share needles.  Ask your health care provider for help if you need support or information about quitting drugs.  Tell your health care provider if you often feel depressed.  Tell your health care provider if you have ever been abused or do not feel safe at home. This information is not intended to replace advice given to you by your health care provider. Make sure you discuss any questions you have with your health care provider. Document Released: 02/14/2011 Document Revised: 01/07/2016 Document Reviewed: 05/05/2015 Elsevier Interactive Patient Education  [employer].

## 2018-02-08 ENCOUNTER — Other Ambulatory Visit: Payer: Self-pay | Admitting: Internal Medicine

## 2018-02-08 DIAGNOSIS — Z1231 Encounter for screening mammogram for malignant neoplasm of breast: Secondary | ICD-10-CM

## 2018-03-08 ENCOUNTER — Ambulatory Visit
Admission: RE | Admit: 2018-03-08 | Discharge: 2018-03-08 | Disposition: A | Discharge status: Home / Self Care | Payer: Medicare Other | Source: Ambulatory Visit | Attending: Internal Medicine | Admitting: Internal Medicine

## 2018-03-08 DIAGNOSIS — Z1231 Encounter for screening mammogram for malignant neoplasm of breast: Secondary | ICD-10-CM | POA: Diagnosis not present

## 2018-05-02 ENCOUNTER — Other Ambulatory Visit: Payer: Self-pay | Admitting: Emergency Medicine

## 2018-05-02 ENCOUNTER — Other Ambulatory Visit (INDEPENDENT_AMBULATORY_CARE_PROVIDER_SITE_OTHER): Payer: Medicare Other

## 2018-05-02 DIAGNOSIS — E7849 Other hyperlipidemia: Secondary | ICD-10-CM | POA: Diagnosis not present

## 2018-05-02 DIAGNOSIS — E034 Atrophy of thyroid (acquired): Secondary | ICD-10-CM

## 2018-05-02 DIAGNOSIS — I10 Essential (primary) hypertension: Secondary | ICD-10-CM

## 2018-05-02 LAB — CBC WITH DIFFERENTIAL/PLATELET
BASOS PCT: 0.6 % (ref 0.0–3.0)
Basophils Absolute: 0 10*3/uL (ref 0.0–0.1)
EOS ABS: 0.1 10*3/uL (ref 0.0–0.7)
EOS PCT: 2.6 % (ref 0.0–5.0)
HCT: 42.5 % (ref 36.0–46.0)
Hemoglobin: 14.3 g/dL (ref 12.0–15.0)
LYMPHS ABS: 1.2 10*3/uL (ref 0.7–4.0)
Lymphocytes Relative: 21.7 % (ref 12.0–46.0)
MCHC: 33.7 g/dL (ref 30.0–36.0)
MCV: 87.7 fl (ref 78.0–100.0)
Monocytes Absolute: 0.6 10*3/uL (ref 0.1–1.0)
Monocytes Relative: 10.6 % (ref 3.0–12.0)
NEUTROS PCT: 64.5 % (ref 43.0–77.0)
Neutro Abs: 3.6 10*3/uL (ref 1.4–7.7)
PLATELETS: 300 10*3/uL (ref 150.0–400.0)
RBC: 4.85 Mil/uL (ref 3.87–5.11)
RDW: 14.4 % (ref 11.5–15.5)
WBC: 5.6 10*3/uL (ref 4.0–10.5)

## 2018-05-02 LAB — LIPID PANEL
Cholesterol: 170 mg/dL (ref 0–200)
HDL: 59.6 mg/dL (ref >39.00)
LDL CALC: 96 mg/dL (ref 0–99)
NonHDL: 110.44
TRIGLYCERIDES: 72 mg/dL (ref 0.0–149.0)
Total CHOL/HDL Ratio: 3
VLDL: 14.4 mg/dL (ref 0.0–40.0)

## 2018-05-02 LAB — HEPATIC FUNCTION PANEL
ALT: 16 U/L (ref 0–35)
AST: 25 U/L (ref 0–37)
Albumin: 3.9 g/dL (ref 3.5–5.2)
Alkaline Phosphatase: 66 U/L (ref 39–117)
BILIRUBIN DIRECT: 0.1 mg/dL (ref 0.0–0.3)
BILIRUBIN TOTAL: 0.6 mg/dL (ref 0.2–1.2)
Total Protein: 6.7 g/dL (ref 6.0–8.3)

## 2018-05-02 LAB — TSH: TSH: 0.25 u[IU]/mL — AB (ref 0.35–4.50)

## 2018-05-02 LAB — T4, FREE: FREE T4: 1.06 ng/dL (ref 0.60–1.60)

## 2018-05-08 ENCOUNTER — Ambulatory Visit (INDEPENDENT_AMBULATORY_CARE_PROVIDER_SITE_OTHER): Payer: Medicare Other | Admitting: Internal Medicine

## 2018-05-08 ENCOUNTER — Encounter: Payer: Self-pay | Admitting: Internal Medicine

## 2018-05-08 VITALS — BP 142/86 | HR 72 | Temp 98.0°F | Ht 65.0 in | Wt 162.0 lb

## 2018-05-08 DIAGNOSIS — I10 Essential (primary) hypertension: Secondary | ICD-10-CM | POA: Diagnosis not present

## 2018-05-08 DIAGNOSIS — K51818 Other ulcerative colitis with other complication: Secondary | ICD-10-CM | POA: Diagnosis not present

## 2018-05-08 DIAGNOSIS — R739 Hyperglycemia, unspecified: Secondary | ICD-10-CM

## 2018-05-08 DIAGNOSIS — D485 Neoplasm of uncertain behavior of skin: Secondary | ICD-10-CM

## 2018-05-08 DIAGNOSIS — M810 Age-related osteoporosis without current pathological fracture: Secondary | ICD-10-CM

## 2018-05-08 DIAGNOSIS — E034 Atrophy of thyroid (acquired): Secondary | ICD-10-CM

## 2018-05-08 DIAGNOSIS — E538 Deficiency of other specified B group vitamins: Secondary | ICD-10-CM

## 2018-05-08 DIAGNOSIS — Z23 Encounter for immunization: Secondary | ICD-10-CM

## 2018-05-08 MED ORDER — EZETIMIBE-SIMVASTATIN 10-20 MG PO TABS: 1.0000 | ORAL_TABLET | Freq: Every day | ORAL | 3 refills | Status: DC

## 2018-05-08 MED ORDER — METOPROLOL SUCCINATE ER 25 MG PO TB24: ORAL_TABLET | ORAL | 3 refills | Status: DC

## 2018-05-08 MED ORDER — VERAPAMIL HCL 80 MG PO TABS: 80.0000 mg | ORAL_TABLET | Freq: Two times a day (BID) | ORAL | 3 refills | Status: DC

## 2018-05-08 MED ORDER — PERINDOPRIL ERBUMINE 8 MG PO TABS: 8.0000 mg | ORAL_TABLET | Freq: Every day | ORAL | 3 refills | Status: DC

## 2018-05-08 MED ORDER — SYNTHROID 50 MCG PO TABS: ORAL_TABLET | ORAL | 3 refills | Status: DC

## 2018-05-08 NOTE — Addendum Note (Signed)
Addended by: Karren Cobble on: 05/08/2018 11:10 AM   Modules accepted: Orders

## 2018-05-08 NOTE — Patient Instructions (Signed)
[facility] well w/Jill 

## 2018-05-08 NOTE — Assessment & Plan Note (Signed)
nose L alar groove lesion - ?skin ca Derm ref

## 2018-05-08 NOTE — Assessment & Plan Note (Signed)
Metoprolol, Verapamil, Aceon

## 2018-05-08 NOTE — Assessment & Plan Note (Signed)
On Levothroid - 8/18 Take 75 mcg a day Mon-Fri and 50 mcg a day on Sat and Sun

## 2018-05-08 NOTE — Assessment & Plan Note (Signed)
Ostomy

## 2018-05-08 NOTE — Assessment & Plan Note (Signed)
labs

## 2018-05-08 NOTE — Assessment & Plan Note (Signed)
discussed

## 2018-05-08 NOTE — Assessment & Plan Note (Signed)
On B12 

## 2018-05-08 NOTE — Progress Notes (Signed)
Subjective:  Patient ID: Jasmine Mccormick, female    DOB: 12-17-1933  Age: 84 y.o. MRN: [id number]  CC: No chief complaint on file.   HPI Jasmine Mccormick presents for dyslipidemia, hypothyroidism, OA C/o fatigue  Outpatient Medications Prior to Visit  Medication Sig Dispense Refill  . Cholecalciferol 1000 units tablet Take 1 tablet (1,000 Units total) by mouth daily. 100 tablet 3  . Cyanocobalamin (VITAMIN B-12) 500 MCG SUBL 1 sl qd 150 tablet 11  . ezetimibe-simvastatin (VYTORIN) 10-20 MG tablet Take 1 tablet by mouth at bedtime. 90 tablet 3  . metoprolol succinate (TOPROL-XL) 25 MG 24 hr tablet TAKE 1 TABLET BY MOUTH DAILY AT NIGHT 90 tablet 3  . Multiple Vitamins-Minerals (MULTIVITAMIN,TX-MINERALS) tablet Take 1 tablet by mouth daily.      . perindopril (ACEON) 8 MG tablet Take 1 tablet (8 mg total) by mouth daily. 90 tablet 3  . SYNTHROID 50 MCG tablet Take 75 mcg a day Mon-Fri and 50 mcg a day on Sat and Sun 45 tablet 11  . triamcinolone cream (KENALOG) 0.5 % Apply topically 2 (two) times daily. 30 g 2  . verapamil (CALAN) 80 MG tablet Take 1 tablet (80 mg total) by mouth 2 (two) times daily. 180 tablet 3   No facility-administered medications prior to visit.     ROS: Review of Systems  Constitutional: Positive for fatigue. Negative for activity change, appetite change, chills and unexpected weight change.  HENT: Negative for congestion, mouth sores and sinus pressure.   Eyes: Negative for visual disturbance.  Respiratory: Negative for cough and chest tightness.   Gastrointestinal: Negative for abdominal pain and nausea.  Genitourinary: Negative for difficulty urinating, frequency and vaginal pain.  Musculoskeletal: Positive for arthralgias. Negative for back pain and gait problem.  Skin: Negative for pallor and rash.  Neurological: Negative for dizziness, tremors, weakness, numbness and headaches.  Psychiatric/Behavioral: Negative for confusion, sleep disturbance and  suicidal ideas.    Objective:  BP (!) 142/86 (BP Location: Left Arm, Patient Position: Sitting, Cuff Size: Normal)   Pulse 72   Temp 98 F (36.7 C) (Oral)   Ht 5\' 5"  (1.651 m)   Wt 162 lb (73.5 kg)   SpO2 96%   BMI 26.96 kg/m   BP Readings from Last 3 Encounters:  05/08/18 (!) 142/86  11/30/17 (!) 158/74  10/18/17 (!) 164/82    Wt Readings from Last 3 Encounters:  05/08/18 162 lb (73.5 kg)  11/30/17 162 lb (73.5 kg)  10/18/17 162 lb (73.5 kg)    Physical Exam  Constitutional: She appears well-developed. No distress.  HENT:  Head: Normocephalic.  Right Ear: External ear normal.  Left Ear: External ear normal.  Nose: Nose normal.  Mouth/Throat: Oropharynx is clear and moist.  Eyes: Pupils are equal, round, and reactive to light. Conjunctivae are normal. Right eye exhibits no discharge. Left eye exhibits no discharge.  Neck: Normal range of motion. Neck supple. No JVD present. No tracheal deviation present. No thyromegaly present.  Cardiovascular: Normal rate, regular rhythm and normal heart sounds.  Pulmonary/Chest: No stridor. No respiratory distress. She has no wheezes.  Abdominal: Soft. Bowel sounds are normal. She exhibits no distension and no mass. There is no tenderness. There is no rebound and no guarding.  Musculoskeletal: She exhibits no edema or tenderness.  Lymphadenopathy:    She has no cervical adenopathy.  Neurological: She displays normal reflexes. No cranial nerve deficit. She exhibits normal muscle tone. Coordination normal.  Skin: No rash  noted. No erythema.  Psychiatric: She has a normal mood and affect. Her behavior is normal. Judgment and thought content normal.  nose L alar groove lesion 4 mm ulcer Ostomy bag abd  Lab Results  Component Value Date   WBC 5.6 05/02/2018   HGB 14.3 05/02/2018   HCT 42.5 05/02/2018   PLT 300.0 05/02/2018   GLUCOSE 99 05/26/2017   CHOL 170 05/02/2018   TRIG 72.0 05/02/2018   HDL 59.60 05/02/2018   LDLCALC 96  05/02/2018   ALT 16 05/02/2018   AST 25 05/02/2018   NA 138 05/26/2017   K 4.6 05/26/2017   CL 102 05/26/2017   CREATININE 0.77 05/26/2017   BUN 11 05/26/2017   CO2 28 05/26/2017   TSH 0.25 (L) 05/02/2018   HGBA1C 6.2 08/13/2013    Mm 3d Screen Breast Bilateral  Result Date: 03/08/2018 CLINICAL DATA:  Screening. EXAM: DIGITAL SCREENING BILATERAL MAMMOGRAM WITH TOMO AND CAD COMPARISON:  Previous exam(s). ACR Breast Density Category c: The breast tissue is heterogeneously dense, which may obscure small masses. FINDINGS: There are no findings suspicious for malignancy. Images were processed with CAD. IMPRESSION: No mammographic evidence of malignancy. A result letter of this screening mammogram will be mailed directly to the patient. RECOMMENDATION: Screening mammogram in one year. (Code:SM-B-01Y) BI-RADS CATEGORY  1: Negative. Electronically Signed   By: Dorise Bullion III M.D   On: 03/08/2018 17:19    Assessment & Plan:   There are no diagnoses linked to this encounter.   No orders of the defined types were placed in this encounter.    Follow-up: No follow-ups on file.  Walker Kehr, MD

## 2018-05-24 DIAGNOSIS — L821 Other seborrheic keratosis: Secondary | ICD-10-CM | POA: Diagnosis not present

## 2018-05-24 DIAGNOSIS — L570 Actinic keratosis: Secondary | ICD-10-CM | POA: Diagnosis not present

## 2018-05-24 DIAGNOSIS — C44311 Basal cell carcinoma of skin of nose: Secondary | ICD-10-CM | POA: Diagnosis not present

## 2018-05-24 DIAGNOSIS — D485 Neoplasm of uncertain behavior of skin: Secondary | ICD-10-CM | POA: Diagnosis not present

## 2018-06-25 ENCOUNTER — Encounter: Payer: Self-pay | Admitting: Family

## 2018-06-25 ENCOUNTER — Ambulatory Visit (INDEPENDENT_AMBULATORY_CARE_PROVIDER_SITE_OTHER): Payer: Medicare Other | Admitting: Family

## 2018-06-25 ENCOUNTER — Other Ambulatory Visit: Payer: Self-pay | Admitting: Family

## 2018-06-25 ENCOUNTER — Ambulatory Visit (INDEPENDENT_AMBULATORY_CARE_PROVIDER_SITE_OTHER)
Admission: RE | Admit: 2018-06-25 | Discharge: 2018-06-25 | Disposition: A | Discharge status: Home / Self Care | Payer: Medicare Other | Source: Ambulatory Visit | Attending: Family | Admitting: Family

## 2018-06-25 ENCOUNTER — Telehealth: Payer: Self-pay | Admitting: Internal Medicine

## 2018-06-25 VITALS — BP 150/64 | HR 61 | Temp 97.8°F | Ht 65.0 in | Wt 164.0 lb

## 2018-06-25 DIAGNOSIS — M25532 Pain in left wrist: Secondary | ICD-10-CM

## 2018-06-25 DIAGNOSIS — S52502A Unspecified fracture of the lower end of left radius, initial encounter for closed fracture: Secondary | ICD-10-CM | POA: Diagnosis not present

## 2018-06-25 DIAGNOSIS — M79602 Pain in left arm: Secondary | ICD-10-CM

## 2018-06-25 DIAGNOSIS — S52515A Nondisplaced fracture of left radial styloid process, initial encounter for closed fracture: Secondary | ICD-10-CM | POA: Diagnosis not present

## 2018-06-25 DIAGNOSIS — M79642 Pain in left hand: Secondary | ICD-10-CM | POA: Diagnosis not present

## 2018-06-25 NOTE — Progress Notes (Signed)
Jasmine Mccormick is a 84 y.o. female with the following history as recorded in EpicCare:  Patient Active Problem List   Diagnosis Date Noted  . Dysuria 08/18/2017  . Hearing loss of both ears due to cerumen impaction 09/03/2015  . Callus of foot 09/03/2015  . Low vitamin B12 level 09/03/2015  . Right-sided face pain 08/21/2014  . Well adult exam 07/04/2012  . Hyperglycemia 07/04/2012  . Hand dermatitis 06/22/2011  . Hypothyroidism 12/07/2010  . Actinic keratosis 05/04/2010  . NEOPLASM, SKIN, UNCERTAIN BEHAVIOR 17/51/0258  . CERUMEN IMPACTION 09/26/2007  . RASH-NONVESICULAR 06/12/2007  . Hyperlipemia 03/19/2007  . [place] DISEASE 03/19/2007  . Essential hypertension 03/19/2007  . Ulcerative colitis ([place]) 03/19/2007  . Osteoporosis 03/19/2007    Current Outpatient Medications  Medication Sig Dispense Refill  . Cholecalciferol 1000 units tablet Take 1 tablet (1,000 Units total) by mouth daily. 100 tablet 3  . Cyanocobalamin (VITAMIN B-12) 500 MCG SUBL 1 sl qd 150 tablet 11  . ezetimibe-simvastatin (VYTORIN) 10-20 MG tablet Take 1 tablet by mouth at bedtime. 90 tablet 3  . metoprolol succinate (TOPROL-XL) 25 MG 24 hr tablet TAKE 1 TABLET BY MOUTH DAILY AT NIGHT 90 tablet 3  . Multiple Vitamins-Minerals (MULTIVITAMIN,TX-MINERALS) tablet Take 1 tablet by mouth daily.      . perindopril (ACEON) 8 MG tablet Take 1 tablet (8 mg total) by mouth daily. 90 tablet 3  . SYNTHROID 50 MCG tablet Take 75 mcg a day Mon-Fri and 50 mcg a day on Sat and Sun 90 tablet 3  . triamcinolone cream (KENALOG) 0.5 % Apply topically 2 (two) times daily. 30 g 2  . verapamil (CALAN) 80 MG tablet Take 1 tablet (80 mg total) by mouth 2 (two) times daily. 180 tablet 3   No current facility-administered medications for this visit.     Allergies: Patient has no known allergies.  Past Medical History:  Diagnosis Date  . HTN (hypertension)    Normal at home  . Hyperlipidemia   . Ileostomy in place [facility])   .  Meniere disease 1996  . Osteoporosis   . UC (ulcerative colitis) [facility])     Past Surgical History:  Procedure Laterality Date  . COLECTOMY    . Ear Shunt for Menier's    . ILEOSTOMY    . PROCTECTOMY      Family History  Problem Relation Age of Onset  . Hypertension Other   . Heart disease Other   . Breast cancer Sister     Social History   Tobacco Use  . Smoking status: Never Smoker  . Smokeless tobacco: Never Used  Substance Use Topics  . Alcohol use: No    Subjective:  Patient presents with concerns for 2 day history of left wrist/ hand swelling; notes that she missed the bottom step on a ladder on Saturday and fell directly on her left wrist; has been trying to ice wrist to help with swelling; limited range of motion due to pain; + history of osteoporosis;       Objective:  Vitals:   06/25/18 0938  BP: (!) 150/64  Pulse: 61  Temp: 97.8 F (36.6 C)  TempSrc: Oral  SpO2: 96%  Weight: 164 lb 0.6 oz (74.4 kg)  Height: 5' 5"  (1.651 m)    General: Well developed, well nourished, in no acute distress  Skin : Warm and dry.  Head: Normocephalic and atraumatic  Lungs: Respirations unlabored;  Musculoskeletal: No deformities; suspect left wrist fracture based on angle  of wrist/ extensive bruising and swelling noted;   Extremities: No edema, cyanosis, clubbing  Vessels: Symmetric bilaterally  Neurologic: Alert and oriented; speech intact; face symmetrical; moves all extremities well; CNII-XII intact without focal deficit   Assessment:  1. Left hand pain   2. Left wrist pain   3. Pain of left upper extremity     Plan:  STAT x-ray does show left distal radial fracture; STAT referral to orthopedics today; follow-up to be determined.   No follow-ups on file.  Orders Placed This Encounter  Procedures  . DG Wrist Complete Left    Standing Status:   Future    Number of Occurrences:   1    Standing Expiration Date:   08/26/2019    Order Specific Question:   Reason for  Exam (SYMPTOM  OR DIAGNOSIS REQUIRED)    Answer:   left wrist pain/ swelling s/p fall    Order Specific Question:   Preferred imaging location?    Answer:   [facility]    Order Specific Question:   Radiology Contrast Protocol - do NOT remove file path    Answer:   \\[email or web address]\Radiant\DXFluoroContrastProtocols.pdf  . DG Hand Complete Left    Standing Status:   Future    Number of Occurrences:   1    Standing Expiration Date:   08/26/2019    Order Specific Question:   Reason for Exam (SYMPTOM  OR DIAGNOSIS REQUIRED)    Answer:   left hand pain/ swelling; s/p fall    Order Specific Question:   Preferred imaging location?    Answer:   [facility]    Order Specific Question:   Radiology Contrast Protocol - do NOT remove file path    Answer:   \\[email or web address]\Radiant\DXFluoroContrastProtocols.pdf  . DG Forearm Left    Standing Status:   Future    Number of Occurrences:   1    Standing Expiration Date:   08/26/2019    Order Specific Question:   Reason for Exam (SYMPTOM  OR DIAGNOSIS REQUIRED)    Answer:   left arm pain s/p fall    Order Specific Question:   Preferred imaging location?    Answer:   [facility]    Order Specific Question:   Radiology Contrast Protocol - do NOT remove file path    Answer:   \\[email or web address]\[email or web address]s.pdf    Requested Prescriptions    No prescriptions requested or ordered in this encounter

## 2018-06-25 NOTE — Telephone Encounter (Signed)
Pt niece came in and reported that the patient fell Sunday and about two hours after the fall her wrist and fingers became really swollen and she believes she broke her wrist. I will call patient later to make appt

## 2018-06-25 NOTE — Telephone Encounter (Signed)
Pt seen Jasmine Mccormick today

## 2018-07-05 DIAGNOSIS — S52502D Unspecified fracture of the lower end of left radius, subsequent encounter for closed fracture with routine healing: Secondary | ICD-10-CM | POA: Diagnosis not present

## 2018-07-19 DIAGNOSIS — S52502D Unspecified fracture of the lower end of left radius, subsequent encounter for closed fracture with routine healing: Secondary | ICD-10-CM | POA: Diagnosis not present

## 2018-07-23 DIAGNOSIS — Z85828 Personal history of other malignant neoplasm of skin: Secondary | ICD-10-CM | POA: Diagnosis not present

## 2018-07-23 DIAGNOSIS — S52502D Unspecified fracture of the lower end of left radius, subsequent encounter for closed fracture with routine healing: Secondary | ICD-10-CM | POA: Diagnosis not present

## 2018-07-23 DIAGNOSIS — C44311 Basal cell carcinoma of skin of nose: Secondary | ICD-10-CM | POA: Diagnosis not present

## 2018-07-31 DIAGNOSIS — S52502D Unspecified fracture of the lower end of left radius, subsequent encounter for closed fracture with routine healing: Secondary | ICD-10-CM | POA: Diagnosis not present

## 2018-08-13 DIAGNOSIS — H04123 Dry eye syndrome of bilateral lacrimal glands: Secondary | ICD-10-CM | POA: Diagnosis not present

## 2018-08-13 DIAGNOSIS — Z961 Presence of intraocular lens: Secondary | ICD-10-CM | POA: Diagnosis not present

## 2018-08-13 DIAGNOSIS — H35033 Hypertensive retinopathy, bilateral: Secondary | ICD-10-CM | POA: Diagnosis not present

## 2018-08-13 DIAGNOSIS — H40013 Open angle with borderline findings, low risk, bilateral: Secondary | ICD-10-CM | POA: Diagnosis not present

## 2018-08-21 DIAGNOSIS — S52502D Unspecified fracture of the lower end of left radius, subsequent encounter for closed fracture with routine healing: Secondary | ICD-10-CM | POA: Diagnosis not present

## 2018-09-22 ENCOUNTER — Ambulatory Visit (INDEPENDENT_AMBULATORY_CARE_PROVIDER_SITE_OTHER): Payer: Medicare Other | Admitting: Podiatry

## 2018-09-22 ENCOUNTER — Encounter: Payer: Self-pay | Admitting: Podiatry

## 2018-09-22 ENCOUNTER — Ambulatory Visit: Payer: Medicare Other

## 2018-09-22 VITALS — BP 179/94 | HR 75 | Resp 16

## 2018-09-22 DIAGNOSIS — M898X7 Other specified disorders of bone, ankle and foot: Secondary | ICD-10-CM

## 2018-09-22 DIAGNOSIS — L600 Ingrowing nail: Secondary | ICD-10-CM

## 2018-09-22 MED ORDER — GENTAMICIN SULFATE 0.1 % EX CREA: 1.0000 "application " | TOPICAL_CREAM | Freq: Two times a day (BID) | CUTANEOUS | 0 refills | Status: DC

## 2018-09-22 NOTE — Patient Instructions (Signed)

## 2018-09-27 NOTE — Progress Notes (Signed)
   Subjective: Patient presents today for evaluation of intermittent pain to the lateral border of the right fifth toe that began a few years ago. Patient is concerned for possible ingrown nail. Wearing shoes increases the pain. She has tried trimming the nail for treatment. Patient presents today for further treatment and evaluation.  Past Medical History:  Diagnosis Date  . HTN (hypertension)    Normal at home  . Hyperlipidemia   . Ileostomy in place [facility])   . Meniere disease 1996  . Osteoporosis   . UC (ulcerative colitis) ([place])     Objective:  General: Well developed, nourished, in no acute distress, alert and oriented x3   Dermatology: Skin is warm, dry and supple bilateral. Lateral border of the right 5th toe appears to be erythematous with evidence of an ingrowing nail. Pain on palpation noted to the border of the nail fold. The remaining nails appear unremarkable at this time. There are no open sores, lesions.  Vascular: Dorsalis Pedis artery and Posterior Tibial artery pedal pulses palpable. No lower extremity edema noted.   Neruologic: Grossly intact via light touch bilateral.  Musculoskeletal: Muscular strength within normal limits in all groups bilateral. Normal range of motion noted to all pedal and ankle joints.   Assesement: #1 Paronychia with ingrowing nail lateral border right 5th toe #2 Pain in toe #3 Incurvated nail  Plan of Care:  1. Patient evaluated.  2. Discussed treatment alternatives and plan of care. Explained nail avulsion procedure and post procedure course to patient. 3. Patient opted for permanent partial nail avulsion of the lateral border of the right 5th toe.  4. Prior to procedure, local anesthesia infiltration utilized using 3 ml of a 50:50 mixture of 2% plain lidocaine and 0.5% plain marcaine in a normal digital block fashion and a betadine prep performed.  5. Partial permanent nail avulsion with chemical matrixectomy performed using [id number]  applications of phenol followed by alcohol flush.  6. Light dressing applied. 7. Prescription for Gentamicin cream provided to patient to use daily with a bandage.  8. Return to clinic in 2 weeks.   Edrick Kins, DPM [facility]  Dr. Edrick Kins, [address]                                        [place], [place] [address]                Office [phone]  Fax [phone]

## 2018-10-10 ENCOUNTER — Ambulatory Visit (INDEPENDENT_AMBULATORY_CARE_PROVIDER_SITE_OTHER): Payer: Medicare Other | Admitting: Podiatry

## 2018-10-10 DIAGNOSIS — L600 Ingrowing nail: Secondary | ICD-10-CM | POA: Diagnosis not present

## 2018-10-10 DIAGNOSIS — M79674 Pain in right toe(s): Secondary | ICD-10-CM | POA: Diagnosis not present

## 2018-10-14 NOTE — Progress Notes (Signed)
   Subjective: Patient presents today status post ingrown nail permanent nail avulsion procedure of the lateral border of the right fifth digit performed on 09/22/2018. She states the area has improved but she is still experiencing some soreness. She has been applying Gentamicin cream and soaking the toe in Epsom salt for treatment. There are no worsening factors noted. Patient is here for further evaluation and treatment.   Past Medical History:  Diagnosis Date  . HTN (hypertension)    Normal at home  . Hyperlipidemia   . Ileostomy in place [facility])   . Meniere disease 1996  . Osteoporosis   . UC (ulcerative colitis) ([place])     Objective: Skin is warm, dry and supple. Nail and respective nail fold appears to be healing appropriately. Open wound to the associated nail fold with a granular wound base and moderate amount of fibrotic tissue. Minimal drainage noted. Mild erythema around the periungual region likely due to phenol chemical matricectomy.  Assessment: #1 postop permanent partial nail avulsion lateral border of the right fifth toe  #2 open wound periungual nail fold of respective digit.   Plan of care: #1 patient was evaluated  #2 debridement of open wound was performed to the periungual border of the respective toe using a currette. Antibiotic ointment and Band-Aid was applied. #3 patient is to return to clinic on a PRN basis.   Edrick Kins, DPM [facility]  Dr. Edrick Kins, [address]                                        [place], [place] [address]                Office [phone]  Fax [phone]

## 2018-11-02 ENCOUNTER — Telehealth: Payer: Self-pay | Admitting: Internal Medicine

## 2018-11-02 ENCOUNTER — Ambulatory Visit: Payer: Self-pay

## 2018-11-02 ENCOUNTER — Ambulatory Visit (INDEPENDENT_AMBULATORY_CARE_PROVIDER_SITE_OTHER): Payer: Medicare Other | Admitting: Family

## 2018-11-02 ENCOUNTER — Encounter: Payer: Self-pay | Admitting: Family

## 2018-11-02 ENCOUNTER — Other Ambulatory Visit: Payer: Self-pay

## 2018-11-02 ENCOUNTER — Other Ambulatory Visit (INDEPENDENT_AMBULATORY_CARE_PROVIDER_SITE_OTHER): Payer: Medicare Other

## 2018-11-02 VITALS — BP 158/70 | HR 68 | Temp 97.9°F | Ht 65.0 in | Wt 156.6 lb

## 2018-11-02 DIAGNOSIS — R300 Dysuria: Secondary | ICD-10-CM

## 2018-11-02 DIAGNOSIS — E034 Atrophy of thyroid (acquired): Secondary | ICD-10-CM

## 2018-11-02 LAB — TSH: TSH: 0.29 u[IU]/mL — AB (ref 0.35–4.50)

## 2018-11-02 LAB — POC URINALSYSI DIPSTICK (AUTOMATED)
BILIRUBIN UA: NEGATIVE
GLUCOSE UA: NEGATIVE
Ketones, UA: NEGATIVE
NITRITE UA: NEGATIVE
Protein, UA: POSITIVE — AB
Spec Grav, UA: 1.015 (ref 1.010–1.025)
UROBILINOGEN UA: 0.2 U/dL
pH, UA: 6 (ref 5.0–8.0)

## 2018-11-02 MED ORDER — CEFUROXIME AXETIL 500 MG PO TABS: 500.0000 mg | ORAL_TABLET | Freq: Two times a day (BID) | ORAL | 0 refills | Status: DC

## 2018-11-02 NOTE — Progress Notes (Signed)
Jasmine Mccormick is a 85 y.o. female with the following history as recorded in EpicCare:  Patient Active Problem List   Diagnosis Date Noted  . Dysuria 08/18/2017  . Hearing loss of both ears due to cerumen impaction 09/03/2015  . Callus of foot 09/03/2015  . Low vitamin B12 level 09/03/2015  . Right-sided face pain 08/21/2014  . Well adult exam 07/04/2012  . Hyperglycemia 07/04/2012  . Hand dermatitis 06/22/2011  . Hypothyroidism 12/07/2010  . Actinic keratosis 05/04/2010  . NEOPLASM, SKIN, UNCERTAIN BEHAVIOR 68/03/8109  . CERUMEN IMPACTION 09/26/2007  . RASH-NONVESICULAR 06/12/2007  . Hyperlipemia 03/19/2007  . [place] DISEASE 03/19/2007  . Essential hypertension 03/19/2007  . Ulcerative colitis ([place]) 03/19/2007  . Osteoporosis 03/19/2007    Current Outpatient Medications  Medication Sig Dispense Refill  . Cholecalciferol 1000 units tablet Take 1 tablet (1,000 Units total) by mouth daily. 100 tablet 3  . Cyanocobalamin (VITAMIN B-12) 500 MCG SUBL 1 sl qd 150 tablet 11  . ezetimibe-simvastatin (VYTORIN) 10-20 MG tablet Take 1 tablet by mouth at bedtime. 90 tablet 3  . gentamicin cream (GARAMYCIN) 0.1 % Apply 1 application topically 2 (two) times daily. 15 g 0  . metoprolol succinate (TOPROL-XL) 25 MG 24 hr tablet TAKE 1 TABLET BY MOUTH DAILY AT NIGHT 90 tablet 3  . Multiple Vitamins-Minerals (MULTIVITAMIN,TX-MINERALS) tablet Take 1 tablet by mouth daily.      . perindopril (ACEON) 8 MG tablet Take 1 tablet (8 mg total) by mouth daily. 90 tablet 3  . SYNTHROID 50 MCG tablet Take 75 mcg a day Mon-Fri and 50 mcg a day on Sat and Sun 90 tablet 3  . triamcinolone cream (KENALOG) 0.5 % Apply topically 2 (two) times daily. 30 g 2  . verapamil (CALAN) 80 MG tablet Take 1 tablet (80 mg total) by mouth 2 (two) times daily. 180 tablet 3  . cefUROXime (CEFTIN) 500 MG tablet Take 1 tablet (500 mg total) by mouth 2 (two) times daily with a meal. 10 tablet 0   No current  facility-administered medications for this visit.     Allergies: Patient has no known allergies.  Past Medical History:  Diagnosis Date  . HTN (hypertension)    Normal at home  . Hyperlipidemia   . Ileostomy in place [facility])   . Meniere disease 1996  . Osteoporosis   . UC (ulcerative colitis) [facility])     Past Surgical History:  Procedure Laterality Date  . COLECTOMY    . Ear Shunt for Menier's    . ILEOSTOMY    . PROCTECTOMY      Family History  Problem Relation Age of Onset  . Hypertension Other   . Heart disease Other   . Breast cancer Sister     Social History   Tobacco Use  . Smoking status: Never Smoker  . Smokeless tobacco: Never Used  Substance Use Topics  . Alcohol use: No    Subjective:  Patient presents with concerns for possible UTI; burning, pressure x 1 day; tried taking a Diflucan yesterday with limited benefit. Has been trying to drink more water recently; no blood in urine, no fever, no back pain;    Objective:  Vitals:   11/02/18 1303  BP: (!) 158/70  Pulse: 68  Temp: 97.9 F (36.6 C)  TempSrc: Oral  SpO2: 95%  Weight: 156 lb 9.6 oz (71 kg)  Height: 5' 5"  (1.651 m)    General: Well developed, well nourished, in no acute distress  Skin :  Warm and dry.  Head: Normocephalic and atraumatic  Lungs: Respirations unlabored; clear to auscultation bilaterally without wheeze, rales, rhonchi  CVS exam: normal rate and regular rhythm.  Neurologic: Alert and oriented; speech intact; face symmetrical; moves all extremities well; CNII-XII intact without focal deficit   Assessment:  1. Dysuria   2. Hypothyroidism due to acquired atrophy of thyroid     Plan:  1. Suspect UTI; check U/A and UTI; Rx for Ceftin 500 mg bid x 5 days; follow-up to be determined. 2. Check TSH today in preparation for her upcoming appt with PCP; discussed if she wants to re-schedule appointment due to current COVID 19 outbreak and she prefers to come to the office if possible.   No  follow-ups on file.  Orders Placed This Encounter  Procedures  . Urine Culture    Standing Status:   Future    Number of Occurrences:   1    Standing Expiration Date:   11/02/2019  . TSH    Standing Status:   Future    Number of Occurrences:   1    Standing Expiration Date:   11/02/2019  . POCT Urinalysis Dipstick (Automated)    Requested Prescriptions   Signed Prescriptions Disp Refills  . cefUROXime (CEFTIN) 500 MG tablet 10 tablet 0    Sig: Take 1 tablet (500 mg total) by mouth 2 (two) times daily with a meal.

## 2018-11-02 NOTE — Telephone Encounter (Signed)
Copied from [place] [phone]. Topic: Quick Communication - Rx Refill/Question >> Nov 02, 2018 10:36 AM Alanda Slim E wrote: Medication:  fluconazole (DIFLUCAN) 150 MG tablet - Pt had an UTI last year and was given this medication and wants to know if she can have this called in for her UTI. Pt still has UTI symptoms / burning / please advise   Has the patient contacted their pharmacy? No   Preferred Pharmacy (with phone number or street name): [id number] [phone](Phone) [phone](Fax)    Agent: Please be advised that RX refills may take up to 3 business days. We ask that you follow-up with your pharmacy.

## 2018-11-02 NOTE — Telephone Encounter (Signed)
Returned call to patient who states that yesterday morning she started to expierience burning and frequency with urination.  She states that last year she had the same symptoms and was prescribed Fluconazole. She didn't take the medication because the doctor changed it to a different antibiotic.  She toke a dose yesterday of the fluconazole. She states the symptoms are not quite as bad today. She was requesting RX be sent to her pharmacy. Per protocol appointment was scheduled.  Pt was told not to take medication unless prescribed by her physician. Care advise read to patient. Pt verbalized understanding of all instructions.  Reason for Disposition . Urinating more frequently than usual (i.e., frequency)  Answer Assessment - Initial Assessment Questions 1. SYMPTOM: "What's the main symptom you're concerned about?" (e.g., frequency, incontinence)     Burning with urination 2. ONSET: "When did the burning  start?"     Yesterday in the AM 3. PAIN: "Is there any pain?" If so, ask: "How bad is it?" (Scale: 1-10; mild, moderate, severe)     5 4. CAUSE: "What do you think is causing the symptoms?"   UTI 5. OTHER SYMPTOMS: "Do you have any other symptoms?" (e.g., fever, flank pain, blood in urine, pain with urination)     Burning and fequency 6. PREGNANCY: "Is there any chance you are pregnant?" "When was your last menstrual period?"     N/A  Protocols used: URINARY [facility]

## 2018-11-04 LAB — URINE CULTURE
MICRO NUMBER:: 340935
SPECIMEN QUALITY:: ADEQUATE

## 2018-11-05 ENCOUNTER — Other Ambulatory Visit: Payer: Self-pay | Admitting: Family

## 2018-11-05 MED ORDER — CIPROFLOXACIN HCL 250 MG PO TABS: 250.0000 mg | ORAL_TABLET | Freq: Two times a day (BID) | ORAL | 0 refills | Status: DC

## 2018-11-05 NOTE — Progress Notes (Signed)
I spoke with patient about her urine culture results. She notes she is about 80% better but feels the Ceftin is causing increased diarrhea/ problems with her ostomy bag. Agree need to change antibiotic- she will d/c Ceftin and plan to change Cipro 250 mg bid x 3 days; She agrees and will call back if symptoms persist.

## 2018-11-08 ENCOUNTER — Ambulatory Visit: Payer: Medicare Other | Admitting: Internal Medicine

## 2018-12-01 ENCOUNTER — Encounter: Payer: Self-pay | Admitting: Family Medicine

## 2018-12-03 ENCOUNTER — Other Ambulatory Visit: Payer: Self-pay | Admitting: Internal Medicine

## 2018-12-03 MED ORDER — AMOXICILLIN 500 MG PO CAPS: 500.0000 mg | ORAL_CAPSULE | Freq: Three times a day (TID) | ORAL | 0 refills | Status: DC

## 2019-02-12 ENCOUNTER — Encounter: Payer: Self-pay | Admitting: Internal Medicine

## 2019-02-12 ENCOUNTER — Ambulatory Visit (INDEPENDENT_AMBULATORY_CARE_PROVIDER_SITE_OTHER): Payer: Medicare Other | Admitting: Internal Medicine

## 2019-02-12 ENCOUNTER — Other Ambulatory Visit (INDEPENDENT_AMBULATORY_CARE_PROVIDER_SITE_OTHER): Payer: Medicare Other

## 2019-02-12 ENCOUNTER — Other Ambulatory Visit: Payer: Self-pay

## 2019-02-12 VITALS — BP 182/78 | HR 67 | Temp 97.5°F | Ht 65.0 in | Wt 156.0 lb

## 2019-02-12 DIAGNOSIS — H6123 Impacted cerumen, bilateral: Secondary | ICD-10-CM

## 2019-02-12 DIAGNOSIS — E538 Deficiency of other specified B group vitamins: Secondary | ICD-10-CM

## 2019-02-12 DIAGNOSIS — E7849 Other hyperlipidemia: Secondary | ICD-10-CM

## 2019-02-12 DIAGNOSIS — I10 Essential (primary) hypertension: Secondary | ICD-10-CM

## 2019-02-12 DIAGNOSIS — K51818 Other ulcerative colitis with other complication: Secondary | ICD-10-CM | POA: Diagnosis not present

## 2019-02-12 DIAGNOSIS — R739 Hyperglycemia, unspecified: Secondary | ICD-10-CM | POA: Diagnosis not present

## 2019-02-12 DIAGNOSIS — M810 Age-related osteoporosis without current pathological fracture: Secondary | ICD-10-CM | POA: Diagnosis not present

## 2019-02-12 DIAGNOSIS — E034 Atrophy of thyroid (acquired): Secondary | ICD-10-CM | POA: Diagnosis not present

## 2019-02-12 LAB — CBC WITH DIFFERENTIAL/PLATELET
Basophils Absolute: 0 10*3/uL (ref 0.0–0.1)
Basophils Relative: 0.5 % (ref 0.0–3.0)
Eosinophils Absolute: 0.1 10*3/uL (ref 0.0–0.7)
Eosinophils Relative: 1.6 % (ref 0.0–5.0)
HCT: 41.9 % (ref 36.0–46.0)
Hemoglobin: 13.9 g/dL (ref 12.0–15.0)
Lymphocytes Relative: 18.7 % (ref 12.0–46.0)
Lymphs Abs: 1.3 10*3/uL (ref 0.7–4.0)
MCHC: 33.3 g/dL (ref 30.0–36.0)
MCV: 91.7 fl (ref 78.0–100.0)
Monocytes Absolute: 0.8 10*3/uL (ref 0.1–1.0)
Monocytes Relative: 11.1 % (ref 3.0–12.0)
Neutro Abs: 4.8 10*3/uL (ref 1.4–7.7)
Neutrophils Relative %: 68.1 % (ref 43.0–77.0)
Platelets: 245 10*3/uL (ref 150.0–400.0)
RBC: 4.56 Mil/uL (ref 3.87–5.11)
RDW: 12.8 % (ref 11.5–15.5)
WBC: 7 10*3/uL (ref 4.0–10.5)

## 2019-02-12 LAB — URINALYSIS
Bilirubin Urine: NEGATIVE
Hgb urine dipstick: NEGATIVE
Ketones, ur: NEGATIVE
Leukocytes,Ua: NEGATIVE
Nitrite: NEGATIVE
Specific Gravity, Urine: 1.01 (ref 1.000–1.030)
Total Protein, Urine: NEGATIVE
Urine Glucose: NEGATIVE
Urobilinogen, UA: 0.2 (ref 0.0–1.0)
pH: 6.5 (ref 5.0–8.0)

## 2019-02-12 LAB — BASIC METABOLIC PANEL
BUN: 14 mg/dL (ref 6–23)
CO2: 26 mEq/L (ref 19–32)
Calcium: 9.7 mg/dL (ref 8.4–10.5)
Chloride: 101 mEq/L (ref 96–112)
Creatinine, Ser: 0.78 mg/dL (ref 0.40–1.20)
GFR: 70.58 mL/min (ref >60.00)
Glucose, Bld: 89 mg/dL (ref 70–99)
Potassium: 4.5 mEq/L (ref 3.5–5.1)
Sodium: 134 mEq/L — ABNORMAL LOW (ref 135–145)

## 2019-02-12 LAB — HEPATIC FUNCTION PANEL
ALT: 15 U/L (ref 0–35)
AST: 25 U/L (ref 0–37)
Albumin: 4.2 g/dL (ref 3.5–5.2)
Alkaline Phosphatase: 75 U/L (ref 39–117)
Bilirubin, Direct: 0.2 mg/dL (ref 0.0–0.3)
Total Bilirubin: 0.5 mg/dL (ref 0.2–1.2)
Total Protein: 6.9 g/dL (ref 6.0–8.3)

## 2019-02-12 LAB — LIPID PANEL
Cholesterol: 165 mg/dL (ref 0–200)
HDL: 69.4 mg/dL (ref >39.00)
LDL Cholesterol: 75 mg/dL (ref 0–99)
NonHDL: 95.64
Total CHOL/HDL Ratio: 2
Triglycerides: 104 mg/dL (ref 0.0–149.0)
VLDL: 20.8 mg/dL (ref 0.0–40.0)

## 2019-02-12 LAB — T4, FREE: Free T4: 0.94 ng/dL (ref 0.60–1.60)

## 2019-02-12 LAB — TSH: TSH: 0.11 u[IU]/mL — ABNORMAL LOW (ref 0.35–4.50)

## 2019-02-12 LAB — VITAMIN B12: Vitamin B-12: 1088 pg/mL — ABNORMAL HIGH (ref 211–911)

## 2019-02-12 MED ORDER — SYNTHROID 50 MCG PO TABS: ORAL_TABLET | ORAL | 3 refills | Status: DC

## 2019-02-12 MED ORDER — PERINDOPRIL ERBUMINE 8 MG PO TABS: 8.0000 mg | ORAL_TABLET | Freq: Every day | ORAL | 3 refills | Status: DC

## 2019-02-12 MED ORDER — EZETIMIBE-SIMVASTATIN 10-20 MG PO TABS: 1.0000 | ORAL_TABLET | Freq: Every day | ORAL | 3 refills | Status: DC

## 2019-02-12 MED ORDER — METOPROLOL SUCCINATE ER 25 MG PO TB24: ORAL_TABLET | ORAL | 3 refills | Status: DC

## 2019-02-12 MED ORDER — VERAPAMIL HCL 80 MG PO TABS: 80.0000 mg | ORAL_TABLET | Freq: Two times a day (BID) | ORAL | 3 refills | Status: DC

## 2019-02-12 NOTE — Assessment & Plan Note (Signed)
Ileostomy

## 2019-02-12 NOTE — Assessment & Plan Note (Signed)
Labs

## 2019-02-12 NOTE — Assessment & Plan Note (Addendum)
BDS 2019 - pt declined Prolia Info given

## 2019-02-12 NOTE — Assessment & Plan Note (Signed)
Metoprolol, Verapamil, Aceon BP is ok at home - always nl 

## 2019-02-12 NOTE — Assessment & Plan Note (Signed)
On B12 vitamin

## 2019-02-12 NOTE — Assessment & Plan Note (Signed)
Jasmine Mccormick

## 2019-02-12 NOTE — Progress Notes (Signed)
Subjective:  Patient ID: Jasmine Mccormick, female    DOB: 12/01/1933  Age: 85 y.o. MRN: [id number]  CC: No chief complaint on file.   HPI Jasmine Mccormick presents for B12 def, HTN, dyslipidemia f/u. Pt had a L wrist fx - healed  Outpatient Medications Prior to Visit  Medication Sig Dispense Refill  . amoxicillin (AMOXIL) 500 MG capsule Take 1 capsule (500 mg total) by mouth 3 (three) times daily. 30 capsule 0  . Cholecalciferol 1000 units tablet Take 1 tablet (1,000 Units total) by mouth daily. 100 tablet 3  . Cyanocobalamin (VITAMIN B-12) 500 MCG SUBL 1 sl qd 150 tablet 11  . ezetimibe-simvastatin (VYTORIN) 10-20 MG tablet Take 1 tablet by mouth at bedtime. 90 tablet 3  . gentamicin cream (GARAMYCIN) 0.1 % Apply 1 application topically 2 (two) times daily. 15 g 0  . metoprolol succinate (TOPROL-XL) 25 MG 24 hr tablet TAKE 1 TABLET BY MOUTH DAILY AT NIGHT 90 tablet 3  . Multiple Vitamins-Minerals (MULTIVITAMIN,TX-MINERALS) tablet Take 1 tablet by mouth daily.      . perindopril (ACEON) 8 MG tablet Take 1 tablet (8 mg total) by mouth daily. 90 tablet 3  . SYNTHROID 50 MCG tablet Take 75 mcg a day Mon-Fri and 50 mcg a day on Sat and Sun 90 tablet 3  . triamcinolone cream (KENALOG) 0.5 % Apply topically 2 (two) times daily. 30 g 2  . verapamil (CALAN) 80 MG tablet Take 1 tablet (80 mg total) by mouth 2 (two) times daily. 180 tablet 3   No facility-administered medications prior to visit.     ROS: Review of Systems  Constitutional: Negative for activity change, appetite change, chills, diaphoresis, fatigue, fever and unexpected weight change.  HENT: Negative for congestion, dental problem, ear pain, hearing loss, mouth sores, postnasal drip, sinus pressure, sneezing, sore throat and voice change.   Eyes: Negative for pain and visual disturbance.  Respiratory: Negative for cough, chest tightness, wheezing and stridor.   Cardiovascular: Negative for chest pain, palpitations and leg  swelling.  Gastrointestinal: Negative for abdominal distention, abdominal pain, blood in stool, nausea, rectal pain and vomiting.  Genitourinary: Negative for decreased urine volume, difficulty urinating, dysuria, frequency, hematuria, menstrual problem, vaginal bleeding, vaginal discharge and vaginal pain.  Musculoskeletal: Positive for arthralgias. Negative for back pain, gait problem, joint swelling and neck pain.  Skin: Negative for color change, rash and wound.  Neurological: Negative for dizziness, tremors, syncope, speech difficulty, weakness and light-headedness.  Hematological: Negative for adenopathy.  Psychiatric/Behavioral: Negative for behavioral problems, confusion, decreased concentration, dysphoric mood, hallucinations, sleep disturbance and suicidal ideas. The patient is not nervous/anxious and is not hyperactive.     Objective:  BP (!) 182/78 (BP Location: Left Arm, Patient Position: Sitting, Cuff Size: Normal)   Pulse 67   Temp (!) 97.5 F (36.4 C) (Oral)   Ht 5' 5"  (1.651 m)   Wt 156 lb (70.8 kg)   SpO2 97%   BMI 25.96 kg/m   BP Readings from Last 3 Encounters:  02/12/19 (!) 182/78  11/02/18 (!) 158/70  09/22/18 (!) 179/94    Wt Readings from Last 3 Encounters:  02/12/19 156 lb (70.8 kg)  11/02/18 156 lb 9.6 oz (71 kg)  06/25/18 164 lb 0.6 oz (74.4 kg)    Physical Exam Constitutional:      General: She is not in acute distress.    Appearance: She is well-developed.  HENT:     Head: Normocephalic.  Right Ear: External ear normal.     Left Ear: External ear normal.     Nose: Nose normal.  Eyes:     General:        Right eye: No discharge.        Left eye: No discharge.     Conjunctiva/sclera: Conjunctivae normal.     Pupils: Pupils are equal, round, and reactive to light.  Neck:     Musculoskeletal: Normal range of motion and neck supple.     Thyroid: No thyromegaly.     Vascular: No JVD.     Trachea: No tracheal deviation.  Cardiovascular:      Rate and Rhythm: Normal rate and regular rhythm.     Heart sounds: Normal heart sounds.  Pulmonary:     Effort: No respiratory distress.     Breath sounds: No stridor. No wheezing.  Abdominal:     General: Bowel sounds are normal. There is no distension.     Palpations: Abdomen is soft. There is no mass.     Tenderness: There is no abdominal tenderness. There is no guarding or rebound.  Musculoskeletal:        General: No tenderness.  Lymphadenopathy:     Cervical: No cervical adenopathy.  Skin:    Findings: No erythema or rash.  Neurological:     Cranial Nerves: No cranial nerve deficit.     Motor: No abnormal muscle tone.     Coordination: Coordination normal.     Deep Tendon Reflexes: Reflexes normal.  Psychiatric:        Behavior: Behavior normal.        Thought Content: Thought content normal.        Judgment: Judgment normal.   ileostomy L wrist sensitive B ear wax   Procedure Note :     Procedure :  Ear irrigation right and left ears   Indication:  Cerumen impaction right and left ears   Risks, including pain, dizziness, eardrum perforation, bleeding, infection and others as well as benefits were explained to the patient in detail. Verbal consent was obtained and the patient agreed to proceed.    We used "The Elephant Ear Irrigation Device" filled with lukewarm water for irrigation. A large amount wax was recovered from both ears. Procedure has also required manual wax removal/instrumentation with an ear wax curette and ear forceps on the right and left ears.   Tolerated well. Complications: None.   Postprocedure instructions :  Call if problems.    Lab Results  Component Value Date   WBC 5.6 05/02/2018   HGB 14.3 05/02/2018   HCT 42.5 05/02/2018   PLT 300.0 05/02/2018   GLUCOSE 99 05/26/2017   CHOL 170 05/02/2018   TRIG 72.0 05/02/2018   HDL 59.60 05/02/2018   LDLCALC 96 05/02/2018   ALT 16 05/02/2018   AST 25 05/02/2018   NA 138 05/26/2017   K 4.6  05/26/2017   CL 102 05/26/2017   CREATININE 0.77 05/26/2017   BUN 11 05/26/2017   CO2 28 05/26/2017   TSH 0.29 (L) 11/02/2018   HGBA1C 6.2 08/13/2013    Dg Forearm Left  Result Date: 06/25/2018 CLINICAL DATA:  Fall 2 days ago.  Generalized arm pain. EXAM: LEFT FOREARM - 2 VIEW COMPARISON:  Wrist series performed today FINDINGS: There is a fracture in the distal left radius, best seen posteriorly on the lateral view. No visible ulnar abnormality. Soft tissues are intact. IMPRESSION: Nondisplaced distal left radial fracture. Electronically Signed  By: Rolm Baptise M.D.   On: 06/25/2018 10:27   Dg Wrist Complete Left  Result Date: 06/25/2018 CLINICAL DATA:  Fall, generalized wrist pain EXAM: LEFT WRIST - COMPLETE 3+ VIEW COMPARISON:  None. FINDINGS: There is a nondisplaced fracture noted posteriorly on the lateral view felt represent at posterior distal left radial fracture. No visible ulnar fracture. No subluxation or dislocation. Posterior soft tissue swelling. IMPRESSION: Nondisplaced distal left radial fracture best seen posteriorly on the lateral view. Electronically Signed   By: Rolm Baptise M.D.   On: 06/25/2018 10:26   Dg Hand Complete Left  Result Date: 06/25/2018 CLINICAL DATA:  Fall, left hand pain, bruising EXAM: LEFT HAND - COMPLETE 3+ VIEW COMPARISON:  None. FINDINGS: There is a nondisplaced fracture noted posteriorly on the lateral view felt represent at posterior distal left radial fracture. No visible ulnar fracture or additional fracture within the left hand. No subluxation or dislocation. Posterior soft tissue swelling. Early osteoarthritis in the IP joints. IMPRESSION: Distal left radial fracture, best seen posteriorly on the lateral view. Electronically Signed   By: Rolm Baptise M.D.   On: 06/25/2018 10:27    Assessment & Plan:   There are no diagnoses linked to this encounter.   No orders of the defined types were placed in this encounter.    Follow-up: No  follow-ups on file.  Walker Kehr, MD

## 2019-02-12 NOTE — Assessment & Plan Note (Signed)
Chronic On Levothroid -

## 2019-02-12 NOTE — Assessment & Plan Note (Signed)
See procedure 

## 2019-05-09 DIAGNOSIS — Z23 Encounter for immunization: Secondary | ICD-10-CM | POA: Diagnosis not present

## 2019-07-01 ENCOUNTER — Other Ambulatory Visit: Payer: Self-pay

## 2019-07-01 ENCOUNTER — Ambulatory Visit (INDEPENDENT_AMBULATORY_CARE_PROVIDER_SITE_OTHER): Payer: Medicare Other | Admitting: Podiatry

## 2019-07-01 ENCOUNTER — Encounter: Payer: Self-pay | Admitting: Podiatry

## 2019-07-01 DIAGNOSIS — M79674 Pain in right toe(s): Secondary | ICD-10-CM | POA: Diagnosis not present

## 2019-07-01 DIAGNOSIS — B351 Tinea unguium: Secondary | ICD-10-CM | POA: Diagnosis not present

## 2019-07-01 DIAGNOSIS — M79675 Pain in left toe(s): Secondary | ICD-10-CM

## 2019-07-01 NOTE — Progress Notes (Signed)
Subjective: Jasmine Mccormick is seen today for follow up painful, elongated, thickened toenails 1-5 b/l feet that she cannot cut. Pain interferes with daily activities. Aggravating factor includes wearing enclosed shoe gear and relieved with periodic debridement.  Her husband is present during the visit. She voices no new pedal problems on today's visit.  Current Outpatient Medications on File Prior to Visit  Medication Sig  . amoxicillin (AMOXIL) 500 MG capsule Take 1 capsule (500 mg total) by mouth 3 (three) times daily.  . Cholecalciferol 1000 units tablet Take 1 tablet (1,000 Units total) by mouth daily.  . Cyanocobalamin (VITAMIN B-12) 500 MCG SUBL 1 sl qd  . ezetimibe-simvastatin (VYTORIN) 10-20 MG tablet Take 1 tablet by mouth at bedtime.  [name] gentamicin cream (GARAMYCIN) 0.1 % Apply 1 application topically 2 (two) times daily.  . metoprolol succinate (TOPROL-XL) 25 MG 24 hr tablet TAKE 1 TABLET BY MOUTH DAILY AT NIGHT  . Multiple Vitamins-Minerals (MULTIVITAMIN,TX-MINERALS) tablet Take 1 tablet by mouth daily.    . perindopril (ACEON) 8 MG tablet Take 1 tablet (8 mg total) by mouth daily.  [name] SYNTHROID 50 MCG tablet Take 75 mcg a day Mon-Fri and 50 mcg a day on Sat and Sun  . triamcinolone cream (KENALOG) 0.5 % Apply topically 2 (two) times daily.  . verapamil (CALAN) 80 MG tablet Take 1 tablet (80 mg total) by mouth 2 (two) times daily.   No current facility-administered medications on file prior to visit.      No Known Allergies   Objective:  Vascular Examination: Capillary refill time immediate x 10 digits.  Dorsalis pedis present b/l.  Posterior tibial pulses present b/l.  Digital hair  present x 10 digits.  Skin temperature gradient WNL b/l.   Dermatological Examination: Skin with normal turgor, texture and tone b/l  Toenails 1-5 b/l discolored, thick, dystrophic with subungual debris and pain with palpation to nailbeds due to thickness of  nails.  Musculoskeletal: Muscle strength 5/5 to all LE muscle groups  No gross bony deformities b/l.  No pain, crepitus or joint limitation noted with ROM.   Neurological Examination: Protective sensation intact 5/5 with 10 gram monofilament bilaterally.  Epicritic sensation present bilaterally.  Vibratory sensation intact bilaterally.   Assessment: Painful onychomycosis toenails 1-5 b/l   Plan: 1. Toenails 1-5 b/l were debrided in length and girth without iatrogenic bleeding. 2. Patient to continue soft, supportive shoe gear. 3. Patient to report any pedal injuries to medical professional immediately. 4. Follow up 3 months.  5. Patient/POA to call should there be a concern in the interim.

## 2019-07-01 NOTE — Patient Instructions (Signed)
Ingrown Toenail An ingrown toenail occurs when the corner or sides of a toenail grow into the surrounding skin. This causes discomfort and pain. The big toe is most commonly affected, but any of the toes can be affected. If an ingrown toenail is not treated, it can become infected. What are the causes? This condition may be caused by:  Wearing shoes that are too small or tight.  An injury, such as stubbing your toe or having your toe stepped on.  Improper cutting or care of your toenails.  Having nail or foot abnormalities that were present from birth (congenital abnormalities), such as having a nail that is too big for your toe. What increases the risk? The following factors may make you more likely to develop ingrown toenails:  Age. Nails tend to get thicker with age, so ingrown nails are more common among older people.  Cutting your toenails incorrectly, such as cutting them very short or cutting them unevenly. An ingrown toenail is more likely to get infected if you have:  Diabetes.  Blood flow (circulation) problems. What are the signs or symptoms? Symptoms of an ingrown toenail may include:  Pain, soreness, or tenderness.  Redness.  Swelling.  Hardening of the skin that surrounds the toenail. Signs that an ingrown toenail may be infected include:  Fluid or pus.  Symptoms that get worse instead of better. How is this diagnosed? An ingrown toenail may be diagnosed based on your medical history, your symptoms, and a physical exam. If you have fluid or blood coming from your toenail, a sample may be collected to test for the specific type of bacteria that is causing the infection. How is this treated? Treatment depends on how severe your ingrown toenail is. You may be able to care for your toenail at home.  If you have an infection, you may be prescribed antibiotic medicines.  If you have fluid or pus draining from your toenail, your health care provider may drain it.   If you have trouble walking, you may be given crutches to use.  If you have a severe or infected ingrown toenail, you may need a procedure to remove part or all of the nail. Follow these instructions at home: Foot care   Do not pick at your toenail or try to remove it yourself.  Soak your foot in warm, soapy water. Do this for 20 minutes, 3 times a day, or as often as told by your health care provider. This helps to keep your toe clean and keep your skin soft.  Wear shoes that fit well and are not too tight. Your health care provider may recommend that you wear open-toed shoes while you heal.  Trim your toenails regularly and carefully. Cut your toenails straight across to prevent injury to the skin at the corners of the toenail. Do not cut your nails in a curved shape.  Keep your feet clean and dry to help prevent infection. Medicines  Take over-the-counter and prescription medicines only as told by your health care provider.  If you were prescribed an antibiotic, take it as told by your health care provider. Do not stop taking the antibiotic even if you start to feel better. Activity  Return to your normal activities as told by your health care provider. Ask your health care provider what activities are safe for you.  Avoid activities that cause pain. General instructions  If your health care provider told you to use crutches to help you move around, use them   as instructed.  Keep all follow-up visits as told by your health care provider. This is important. Contact a health care provider if:  You have more redness, swelling, pain, or other symptoms that do not improve with treatment.  You have fluid, blood, or pus coming from your toenail. Get help right away if:  You have a red streak on your skin that starts at your foot and spreads up your leg.  You have a fever. Summary  An ingrown toenail occurs when the corner or sides of a toenail grow into the surrounding skin.  This causes discomfort and pain. The big toe is most commonly affected, but any of the toes can be affected.  If an ingrown toenail is not treated, it can become infected.  Fluid or pus draining from your toenail is a sign of infection. Your health care provider may need to drain it. You may be given antibiotics to treat the infection.  Trimming your toenails regularly and properly can help you prevent an ingrown toenail. This information is not intended to replace advice given to you by your health care provider. Make sure you discuss any questions you have with your health care provider. Document Released: 07/29/2000 Document Revised: 11/23/2018 Document Reviewed: 04/19/2017 Elsevier Patient Education  2020 [employer]  

## 2019-07-05 ENCOUNTER — Other Ambulatory Visit: Payer: Self-pay

## 2019-07-08 ENCOUNTER — Ambulatory Visit: Payer: Medicare Other | Admitting: Podiatry

## 2019-08-22 DIAGNOSIS — L570 Actinic keratosis: Secondary | ICD-10-CM | POA: Diagnosis not present

## 2019-08-22 DIAGNOSIS — L720 Epidermal cyst: Secondary | ICD-10-CM | POA: Diagnosis not present

## 2019-08-22 DIAGNOSIS — Z85828 Personal history of other malignant neoplasm of skin: Secondary | ICD-10-CM | POA: Diagnosis not present

## 2019-08-22 DIAGNOSIS — L821 Other seborrheic keratosis: Secondary | ICD-10-CM | POA: Diagnosis not present

## 2019-08-29 DIAGNOSIS — H35363 Drusen (degenerative) of macula, bilateral: Secondary | ICD-10-CM | POA: Diagnosis not present

## 2019-08-29 DIAGNOSIS — Z961 Presence of intraocular lens: Secondary | ICD-10-CM | POA: Diagnosis not present

## 2019-08-29 DIAGNOSIS — H40013 Open angle with borderline findings, low risk, bilateral: Secondary | ICD-10-CM | POA: Diagnosis not present

## 2019-08-29 DIAGNOSIS — H35033 Hypertensive retinopathy, bilateral: Secondary | ICD-10-CM | POA: Diagnosis not present

## 2019-09-03 ENCOUNTER — Ambulatory Visit (INDEPENDENT_AMBULATORY_CARE_PROVIDER_SITE_OTHER): Payer: Medicare Other | Admitting: Internal Medicine

## 2019-09-03 ENCOUNTER — Encounter: Payer: Self-pay | Admitting: Internal Medicine

## 2019-09-03 ENCOUNTER — Other Ambulatory Visit: Payer: Self-pay

## 2019-09-03 ENCOUNTER — Ambulatory Visit: Payer: Medicare Other | Attending: Internal Medicine

## 2019-09-03 DIAGNOSIS — E785 Hyperlipidemia, unspecified: Secondary | ICD-10-CM | POA: Diagnosis not present

## 2019-09-03 DIAGNOSIS — E538 Deficiency of other specified B group vitamins: Secondary | ICD-10-CM | POA: Diagnosis not present

## 2019-09-03 DIAGNOSIS — Z23 Encounter for immunization: Secondary | ICD-10-CM | POA: Diagnosis not present

## 2019-09-03 DIAGNOSIS — I10 Essential (primary) hypertension: Secondary | ICD-10-CM | POA: Diagnosis not present

## 2019-09-03 DIAGNOSIS — E034 Atrophy of thyroid (acquired): Secondary | ICD-10-CM | POA: Diagnosis not present

## 2019-09-03 LAB — TSH: TSH: 0.14 u[IU]/mL — ABNORMAL LOW (ref 0.35–4.50)

## 2019-09-03 LAB — BASIC METABOLIC PANEL
BUN: 14 mg/dL (ref 6–23)
CO2: 25 mEq/L (ref 19–32)
Calcium: 9.7 mg/dL (ref 8.4–10.5)
Chloride: 100 mEq/L (ref 96–112)
Creatinine, Ser: 0.81 mg/dL (ref 0.40–1.20)
GFR: 67.48 mL/min (ref >60.00)
Glucose, Bld: 85 mg/dL (ref 70–99)
Potassium: 4 mEq/L (ref 3.5–5.1)
Sodium: 134 mEq/L — ABNORMAL LOW (ref 135–145)

## 2019-09-03 LAB — T4, FREE: Free T4: 1.2 ng/dL (ref 0.60–1.60)

## 2019-09-03 NOTE — Assessment & Plan Note (Signed)
Labs On B12

## 2019-09-03 NOTE — Assessment & Plan Note (Signed)
Metoprolol, Verapamil, Aceon BP is ok at home - always nl

## 2019-09-03 NOTE — Assessment & Plan Note (Signed)
Vytorin

## 2019-09-03 NOTE — Progress Notes (Signed)
+   Subjective:  Patient ID: Jasmine Mccormick, female    DOB: 1934-06-28  Age: 86 y.o. MRN: [id number]  CC: No chief complaint on file.   HPI ALEANNA MENGE presents for dyslipidemia, HTN, B12 def f/u  Outpatient Medications Prior to Visit  Medication Sig Dispense Refill  . amoxicillin (AMOXIL) 500 MG capsule Take 1 capsule (500 mg total) by mouth 3 (three) times daily. 30 capsule 0  . Cholecalciferol 1000 units tablet Take 1 tablet (1,000 Units total) by mouth daily. 100 tablet 3  . Cyanocobalamin (VITAMIN B-12) 500 MCG SUBL 1 sl qd 150 tablet 11  . ezetimibe-simvastatin (VYTORIN) 10-20 MG tablet Take 1 tablet by mouth at bedtime. 90 tablet 3  . gentamicin cream (GARAMYCIN) 0.1 % Apply 1 application topically 2 (two) times daily. 15 g 0  . metoprolol succinate (TOPROL-XL) 25 MG 24 hr tablet TAKE 1 TABLET BY MOUTH DAILY AT NIGHT 90 tablet 3  . Multiple Vitamins-Minerals (MULTIVITAMIN,TX-MINERALS) tablet Take 1 tablet by mouth daily.      . perindopril (ACEON) 8 MG tablet Take 1 tablet (8 mg total) by mouth daily. 90 tablet 3  . SYNTHROID 50 MCG tablet Take 75 mcg a day Mon-Fri and 50 mcg a day on Sat and Sun 90 tablet 3  . triamcinolone cream (KENALOG) 0.5 % Apply topically 2 (two) times daily. 30 g 2  . verapamil (CALAN) 80 MG tablet Take 1 tablet (80 mg total) by mouth 2 (two) times daily. 180 tablet 3   No facility-administered medications prior to visit.    ROS: Review of Systems  Constitutional: Negative for activity change, appetite change, chills, fatigue and unexpected weight change.  HENT: Negative for congestion, mouth sores and sinus pressure.   Eyes: Negative for visual disturbance.  Respiratory: Negative for cough and chest tightness.   Gastrointestinal: Negative for abdominal pain and nausea.  Genitourinary: Negative for difficulty urinating, frequency and vaginal pain.  Musculoskeletal: Positive for arthralgias and gait problem. Negative for back pain.  Skin:  Negative for pallor and rash.  Neurological: Negative for dizziness, tremors, weakness, numbness and headaches.  Psychiatric/Behavioral: Negative for confusion and sleep disturbance.    Objective:  BP (!) 182/84 (BP Location: Left Arm, Patient Position: Sitting, Cuff Size: Normal)   Pulse 70   Temp 98.1 F (36.7 C) (Oral)   Ht 5' 5"  (1.651 m)   Wt 157 lb (71.2 kg)   SpO2 98%   BMI 26.13 kg/m   BP Readings from Last 3 Encounters:  09/03/19 (!) 182/84  02/12/19 (!) 182/78  11/02/18 (!) 158/70    Wt Readings from Last 3 Encounters:  09/03/19 157 lb (71.2 kg)  02/12/19 156 lb (70.8 kg)  11/02/18 156 lb 9.6 oz (71 kg)    Physical Exam Constitutional:      General: She is not in acute distress.    Appearance: She is well-developed.  HENT:     Head: Normocephalic.     Right Ear: External ear normal.     Left Ear: External ear normal.     Nose: Nose normal.  Eyes:     General:        Right eye: No discharge.        Left eye: No discharge.     Conjunctiva/sclera: Conjunctivae normal.     Pupils: Pupils are equal, round, and reactive to light.  Neck:     Thyroid: No thyromegaly.     Vascular: No JVD.  Trachea: No tracheal deviation.  Cardiovascular:     Rate and Rhythm: Normal rate and regular rhythm.     Heart sounds: Normal heart sounds.  Pulmonary:     Effort: No respiratory distress.     Breath sounds: No stridor. No wheezing.  Abdominal:     General: Bowel sounds are normal. There is no distension.     Palpations: Abdomen is soft. There is no mass.     Tenderness: There is no abdominal tenderness. There is no guarding or rebound.  Musculoskeletal:        General: No tenderness.     Cervical back: Normal range of motion and neck supple.  Lymphadenopathy:     Cervical: No cervical adenopathy.  Skin:    Findings: No erythema or rash.  Neurological:     Cranial Nerves: No cranial nerve deficit.     Motor: No abnormal muscle tone.     Coordination:  Coordination abnormal.     Deep Tendon Reflexes: Reflexes normal.  Psychiatric:        Behavior: Behavior normal.        Thought Content: Thought content normal.        Judgment: Judgment normal.     Lab Results  Component Value Date   WBC 7.0 02/12/2019   HGB 13.9 02/12/2019   HCT 41.9 02/12/2019   PLT 245.0 02/12/2019   GLUCOSE 89 02/12/2019   CHOL 165 02/12/2019   TRIG 104.0 02/12/2019   HDL 69.40 02/12/2019   LDLCALC 75 02/12/2019   ALT 15 02/12/2019   AST 25 02/12/2019   NA 134 (L) 02/12/2019   K 4.5 02/12/2019   CL 101 02/12/2019   CREATININE 0.78 02/12/2019   BUN 14 02/12/2019   CO2 26 02/12/2019   TSH 0.11 (L) 02/12/2019   HGBA1C 6.2 08/13/2013    DG Forearm Left  Result Date: 06/25/2018 CLINICAL DATA:  Fall 2 days ago.  Generalized arm pain. EXAM: LEFT FOREARM - 2 VIEW COMPARISON:  Wrist series performed today FINDINGS: There is a fracture in the distal left radius, best seen posteriorly on the lateral view. No visible ulnar abnormality. Soft tissues are intact. IMPRESSION: Nondisplaced distal left radial fracture. Electronically Signed   By: Rolm Baptise M.D.   On: 06/25/2018 10:27   DG Wrist Complete Left  Result Date: 06/25/2018 CLINICAL DATA:  Fall, generalized wrist pain EXAM: LEFT WRIST - COMPLETE 3+ VIEW COMPARISON:  None. FINDINGS: There is a nondisplaced fracture noted posteriorly on the lateral view felt represent at posterior distal left radial fracture. No visible ulnar fracture. No subluxation or dislocation. Posterior soft tissue swelling. IMPRESSION: Nondisplaced distal left radial fracture best seen posteriorly on the lateral view. Electronically Signed   By: Rolm Baptise M.D.   On: 06/25/2018 10:26   DG Hand Complete Left  Result Date: 06/25/2018 CLINICAL DATA:  Fall, left hand pain, bruising EXAM: LEFT HAND - COMPLETE 3+ VIEW COMPARISON:  None. FINDINGS: There is a nondisplaced fracture noted posteriorly on the lateral view felt represent at  posterior distal left radial fracture. No visible ulnar fracture or additional fracture within the left hand. No subluxation or dislocation. Posterior soft tissue swelling. Early osteoarthritis in the IP joints. IMPRESSION: Distal left radial fracture, best seen posteriorly on the lateral view. Electronically Signed   By: Rolm Baptise M.D.   On: 06/25/2018 10:27    Assessment & Plan:   There are no diagnoses linked to this encounter.   No orders of the  defined types were placed in this encounter.    Follow-up: No follow-ups on file.  Walker Kehr, MD

## 2019-09-03 NOTE — Progress Notes (Signed)
   Covid-19 Vaccination Clinic  Name:  Jasmine Mccormick    MRN: [id number] DOB: 1933/10/21  09/03/2019  Ms. Barge was observed post Covid-19 immunization for 15 minutes without incidence. She was provided with Vaccine Information Sheet and instruction to access the V-Safe system.   Ms. Cotta was instructed to call 911 with any severe reactions post vaccine: [name] Difficulty breathing  . Swelling of your face and throat  . A fast heartbeat  . A bad rash all over your body  . Dizziness and weakness    Immunizations Administered    Name Date Dose VIS Date Route   Pfizer COVID-19 Vaccine 09/03/2019  4:43 PM 0.3 mL 07/26/2019 Intramuscular   Manufacturer: [place]   Lot: [id number]   [place]: [id number]

## 2019-09-03 NOTE — Assessment & Plan Note (Signed)
On Levothroid

## 2019-09-10 ENCOUNTER — Ambulatory Visit: Payer: Medicare Other

## 2019-09-23 ENCOUNTER — Ambulatory Visit: Payer: Medicare Other | Attending: Internal Medicine

## 2019-09-23 DIAGNOSIS — Z23 Encounter for immunization: Secondary | ICD-10-CM | POA: Insufficient documentation

## 2019-09-23 NOTE — Progress Notes (Signed)
   Covid-19 Vaccination Clinic  Name:  Jasmine Mccormick    MRN: [id number] DOB: Dec 20, 1933  09/23/2019  [name] was observed post Covid-19 immunization for 15 minutes without incidence. She was provided with Vaccine Information Sheet and instruction to access the V-Safe system.   [name] was instructed to call 911 with any severe reactions post vaccine: [name] Difficulty breathing  . Swelling of your face and throat  . A fast heartbeat  . A bad rash all over your body  . Dizziness and weakness    Immunizations Administered    Name Date Dose VIS Date Route   Pfizer COVID-19 Vaccine 09/23/2019  8:58 AM 0.3 mL 07/26/2019 Intramuscular   Manufacturer: [place]   Lot: [id number]   [place]: [id number]

## 2019-09-24 ENCOUNTER — Ambulatory Visit: Payer: Medicare Other

## 2019-10-01 ENCOUNTER — Other Ambulatory Visit: Payer: Self-pay

## 2019-10-01 ENCOUNTER — Ambulatory Visit (INDEPENDENT_AMBULATORY_CARE_PROVIDER_SITE_OTHER): Payer: Medicare Other | Admitting: Podiatry

## 2019-10-01 ENCOUNTER — Encounter: Payer: Self-pay | Admitting: Podiatry

## 2019-10-01 DIAGNOSIS — M79675 Pain in left toe(s): Secondary | ICD-10-CM

## 2019-10-01 DIAGNOSIS — B351 Tinea unguium: Secondary | ICD-10-CM | POA: Diagnosis not present

## 2019-10-01 DIAGNOSIS — M79674 Pain in right toe(s): Secondary | ICD-10-CM

## 2019-10-01 NOTE — Patient Instructions (Signed)

## 2019-10-03 NOTE — Progress Notes (Signed)
Subjective: Jasmine Mccormick presents today for follow up of painful mycotic nails b/l that are difficult to trim. Pain interferes with ambulation. Aggravating factors include wearing enclosed shoe gear. Pain is relieved with periodic professional debridement.   No Known Allergies   Objective: There were no vitals filed for this visit.  Vascular Examination:  Capillary refill time to digits immediate b/l, palpable DP pulses b/l, palpable PT pulses b/l, pedal hair present b/l and skin temperature gradient within normal limits b/l  Dermatological Examination: Pedal skin with normal turgor, texture and tone bilaterally, no open wounds bilaterally, no interdigital macerations bilaterally and toenails 1-5 b/l elongated, dystrophic, thickened, crumbly with subungual debris  Musculoskeletal: Normal muscle strength 5/5 to all lower extremity muscle groups bilaterally, no gross bony deformities bilaterally and no pain crepitus or joint limitation noted with ROM b/l  Neurological: Protective sensation intact 5/5 intact bilaterally with 10g monofilament b/l and vibratory sensation intact b/l  Assessment: 1. Pain due to onychomycosis of toenails of both feet    Plan: -Toenails 1-5 b/l were debrided in length and girth without iatrogenic bleeding. -Patient to continue soft, supportive shoe gear daily. -Patient to report any pedal injuries to medical professional immediately. -Patient/POA to call should there be question/concern in the interim.  Return in about 3 months (around 12/29/2019) for nail trim.

## 2019-11-11 ENCOUNTER — Telehealth: Payer: Self-pay

## 2019-11-11 NOTE — Telephone Encounter (Signed)
New message   Checking on the status of the fax that was sent over to Fax # [phone].

## 2019-11-18 ENCOUNTER — Other Ambulatory Visit: Payer: Self-pay

## 2019-11-18 NOTE — Telephone Encounter (Signed)
Medication Refill - Medication: SYNTHROID 50 MCG tablet verapamil (CALAN) 80 MG tablet   Has the patient contacted their pharmacy? Yes.   (Agent: If no, request that the patient contact the pharmacy for the refill.) (Agent: If yes, when and what did the pharmacy advise?)  Preferred Pharmacy (with phone number or street name): [id number] - [place], [place]  Agent: Please be advised that RX refills may take up to 3 business days. We ask that you follow-up with your pharmacy.

## 2019-11-19 MED ORDER — VERAPAMIL HCL 80 MG PO TABS: 80.0000 mg | ORAL_TABLET | Freq: Two times a day (BID) | ORAL | 3 refills | Status: DC

## 2019-11-19 MED ORDER — SYNTHROID 50 MCG PO TABS: ORAL_TABLET | ORAL | 3 refills | Status: DC

## 2019-11-20 NOTE — Telephone Encounter (Signed)
Printed rxs signed and faxed to pharmacy.

## 2019-11-27 ENCOUNTER — Telehealth: Payer: Self-pay | Admitting: Internal Medicine

## 2019-11-27 NOTE — Telephone Encounter (Signed)
This has been refaxed

## 2019-11-27 NOTE — Telephone Encounter (Signed)
   Patient calling with concerns about ostomy supplies. Patient states [facility] needs to contacted today in ensure her order is not canceled  Please call [phone]

## 2020-01-01 ENCOUNTER — Ambulatory Visit (INDEPENDENT_AMBULATORY_CARE_PROVIDER_SITE_OTHER): Payer: Medicare Other | Admitting: Podiatry

## 2020-01-01 ENCOUNTER — Encounter: Payer: Self-pay | Admitting: Podiatry

## 2020-01-01 ENCOUNTER — Other Ambulatory Visit: Payer: Self-pay

## 2020-01-01 DIAGNOSIS — M79671 Pain in right foot: Secondary | ICD-10-CM

## 2020-01-01 DIAGNOSIS — M79674 Pain in right toe(s): Secondary | ICD-10-CM | POA: Diagnosis not present

## 2020-01-01 DIAGNOSIS — M79675 Pain in left toe(s): Secondary | ICD-10-CM

## 2020-01-01 DIAGNOSIS — B351 Tinea unguium: Secondary | ICD-10-CM

## 2020-01-01 DIAGNOSIS — L84 Corns and callosities: Secondary | ICD-10-CM

## 2020-01-01 NOTE — Patient Instructions (Signed)

## 2020-01-07 ENCOUNTER — Telehealth: Payer: Self-pay

## 2020-01-07 DIAGNOSIS — E785 Hyperlipidemia, unspecified: Secondary | ICD-10-CM

## 2020-01-07 DIAGNOSIS — R739 Hyperglycemia, unspecified: Secondary | ICD-10-CM

## 2020-01-07 DIAGNOSIS — E034 Atrophy of thyroid (acquired): Secondary | ICD-10-CM

## 2020-01-07 DIAGNOSIS — E559 Vitamin D deficiency, unspecified: Secondary | ICD-10-CM

## 2020-01-07 NOTE — Telephone Encounter (Signed)
New message   The patient voiced can lab work to be entered before appt in July.

## 2020-01-08 NOTE — Progress Notes (Signed)
Subjective: Jasmine Mccormick is a 86 y.o. female patient seen today painful mycotic nails b/l that are difficult to trim. Pain interferes with ambulation. Aggravating factors include wearing enclosed shoe gear. Pain is relieved with periodic professional debridement.  Patient notes she had a lesion on plantar aspect of right foot. She used OTC wart medication for it. Denies any redness, drainage or swelling from lesion.  Patient Active Problem List   Diagnosis Date Noted  . Dysuria 08/18/2017  . Hearing loss of both ears due to cerumen impaction 09/03/2015  . Callus of foot 09/03/2015  . Low vitamin B12 level 09/03/2015  . Right-sided face pain 08/21/2014  . Well adult exam 07/04/2012  . Hyperglycemia 07/04/2012  . Hand dermatitis 06/22/2011  . Hypothyroidism 12/07/2010  . Actinic keratosis 05/04/2010  . NEOPLASM, SKIN, UNCERTAIN BEHAVIOR 36/46/8032  . CERUMEN IMPACTION 09/26/2007  . RASH-NONVESICULAR 06/12/2007  . Dyslipidemia 03/19/2007  . [place] DISEASE 03/19/2007  . Essential hypertension 03/19/2007  . Ulcerative colitis ([place]) 03/19/2007  . Osteoporosis 03/19/2007    Current Outpatient Medications on File Prior to Visit  Medication Sig Dispense Refill  . amoxicillin (AMOXIL) 500 MG capsule Take 1 capsule (500 mg total) by mouth 3 (three) times daily. 30 capsule 0  . Cholecalciferol 1000 units tablet Take 1 tablet (1,000 Units total) by mouth daily. 100 tablet 3  . Cyanocobalamin (VITAMIN B-12) 500 MCG SUBL 1 sl qd 150 tablet 11  . ezetimibe-simvastatin (VYTORIN) 10-20 MG tablet Take 1 tablet by mouth at bedtime. 90 tablet 3  . gentamicin cream (GARAMYCIN) 0.1 % Apply 1 application topically 2 (two) times daily. 15 g 0  . metoprolol succinate (TOPROL-XL) 25 MG 24 hr tablet TAKE 1 TABLET BY MOUTH DAILY AT NIGHT 90 tablet 3  . Multiple Vitamins-Minerals (MULTIVITAMIN,TX-MINERALS) tablet Take 1 tablet by mouth daily.      . perindopril (ACEON) 8 MG tablet Take 1 tablet (8 mg  total) by mouth daily. 90 tablet 3  . SYNTHROID 50 MCG tablet Take 75 mcg a day Mon-Fri and 50 mcg a day on Sat and Sun 90 tablet 3  . triamcinolone cream (KENALOG) 0.5 % Apply topically 2 (two) times daily. 30 g 2  . verapamil (CALAN) 80 MG tablet Take 1 tablet (80 mg total) by mouth 2 (two) times daily. 180 tablet 3   No current facility-administered medications on file prior to visit.    No Known Allergies  Objective: Physical Exam  General: Jasmine Mccormick is a pleasant 86 y.o.  Caucasian female, in NAD. AAO x 3.   Vascular:  Neurovascular status unchanged b/l. Capillary refill time to digits immediate b/l. Palpable DP pulses b/l. Palpable PT pulses b/l. Pedal hair present b/l. Skin temperature gradient within normal limits b/l.  Dermatological:  Pedal skin with normal turgor, texture and tone bilaterally. No open wounds bilaterally. No interdigital macerations bilaterally. Toenails 1-5 b/l elongated, dystrophic, thickened, crumbly with subungual debris and tenderness to dorsal palpation. Macerated flat lesion noted submetatarsal head 2 right foot with no thrombosed capillaries.  Musculoskeletal:  Normal muscle strength 5/5 to all lower extremity muscle groups bilaterally. No gross bony deformities bilaterally. No pain crepitus or joint limitation noted with ROM b/l.  Neurological:  Protective sensation intact 5/5 intact bilaterally with 10g monofilament b/l. Vibratory sensation intact b/l. Proprioception intact bilaterally.  Assessment and Plan:  1. Pain due to onychomycosis of toenails of both feet   2. Callus of foot   3. Right foot pain    -  Examined patient. -Toenails 1-5 b/l were debrided in length and girth with sterile nail nippers and dremel without iatrogenic bleeding.  -Patient to continue soft, supportive shoe gear daily. -Patient to report any pedal injuries to medical professional immediately. -Educated patient on dangers of using OTC wart  medication. -Patient/POA to call should there be question/concern in the interim.  Return in about 3 months (around 04/02/2020) for nail trim.  Marzetta Board, DPM

## 2020-01-08 NOTE — Telephone Encounter (Signed)
Ok Thx 

## 2020-01-14 ENCOUNTER — Telehealth: Payer: Self-pay | Admitting: Internal Medicine

## 2020-01-14 NOTE — Telephone Encounter (Signed)
    Alfred from [facility] asking for clarification on directions for : verapamil (CALAN) 80 MG tablet SYNTHROID 50 MCG tablet  Please call [phone]

## 2020-01-15 NOTE — Telephone Encounter (Signed)
Called pharmacy there was no answer LMOM RTC...Johny Chess

## 2020-01-15 NOTE — Telephone Encounter (Signed)
[place] back spoke w/pharmacist " Delrae Alfred" he wanted to verify instructions on the levothyroxine. Gave him directions for med.Marland KitchenJohny Mccormick

## 2020-02-20 ENCOUNTER — Ambulatory Visit (INDEPENDENT_AMBULATORY_CARE_PROVIDER_SITE_OTHER): Payer: Medicare Other | Admitting: Internal Medicine

## 2020-02-20 ENCOUNTER — Other Ambulatory Visit: Payer: Self-pay

## 2020-02-20 ENCOUNTER — Encounter: Payer: Self-pay | Admitting: Internal Medicine

## 2020-02-20 ENCOUNTER — Other Ambulatory Visit: Payer: Self-pay | Admitting: *Deleted

## 2020-02-20 ENCOUNTER — Other Ambulatory Visit (INDEPENDENT_AMBULATORY_CARE_PROVIDER_SITE_OTHER): Payer: Medicare Other

## 2020-02-20 VITALS — BP 178/74 | HR 75 | Temp 98.0°F | Ht 65.0 in | Wt 160.0 lb

## 2020-02-20 DIAGNOSIS — R739 Hyperglycemia, unspecified: Secondary | ICD-10-CM

## 2020-02-20 DIAGNOSIS — E034 Atrophy of thyroid (acquired): Secondary | ICD-10-CM | POA: Diagnosis not present

## 2020-02-20 DIAGNOSIS — I10 Essential (primary) hypertension: Secondary | ICD-10-CM

## 2020-02-20 DIAGNOSIS — E785 Hyperlipidemia, unspecified: Secondary | ICD-10-CM | POA: Diagnosis not present

## 2020-02-20 DIAGNOSIS — R300 Dysuria: Secondary | ICD-10-CM | POA: Diagnosis not present

## 2020-02-20 DIAGNOSIS — R309 Painful micturition, unspecified: Secondary | ICD-10-CM

## 2020-02-20 DIAGNOSIS — E559 Vitamin D deficiency, unspecified: Secondary | ICD-10-CM | POA: Diagnosis not present

## 2020-02-20 LAB — LIPID PANEL
Cholesterol: 164 mg/dL (ref 0–200)
HDL: 68.8 mg/dL (ref >39.00)
LDL Cholesterol: 78 mg/dL (ref 0–99)
NonHDL: 95.41
Total CHOL/HDL Ratio: 2
Triglycerides: 88 mg/dL (ref 0.0–149.0)
VLDL: 17.6 mg/dL (ref 0.0–40.0)

## 2020-02-20 LAB — CBC WITH DIFFERENTIAL/PLATELET
Basophils Absolute: 0 10*3/uL (ref 0.0–0.1)
Basophils Relative: 0.4 % (ref 0.0–3.0)
Eosinophils Absolute: 0.1 10*3/uL (ref 0.0–0.7)
Eosinophils Relative: 1.1 % (ref 0.0–5.0)
HCT: 40.8 % (ref 36.0–46.0)
Hemoglobin: 13.6 g/dL (ref 12.0–15.0)
Lymphocytes Relative: 8.6 % — ABNORMAL LOW (ref 12.0–46.0)
Lymphs Abs: 0.9 10*3/uL (ref 0.7–4.0)
MCHC: 33.3 g/dL (ref 30.0–36.0)
MCV: 91.9 fl (ref 78.0–100.0)
Monocytes Absolute: 0.9 10*3/uL (ref 0.1–1.0)
Monocytes Relative: 8.4 % (ref 3.0–12.0)
Neutro Abs: 9 10*3/uL — ABNORMAL HIGH (ref 1.4–7.7)
Neutrophils Relative %: 81.5 % — ABNORMAL HIGH (ref 43.0–77.0)
Platelets: 248 10*3/uL (ref 150.0–400.0)
RBC: 4.44 Mil/uL (ref 3.87–5.11)
RDW: 13.2 % (ref 11.5–15.5)
WBC: 11 10*3/uL — ABNORMAL HIGH (ref 4.0–10.5)

## 2020-02-20 LAB — BASIC METABOLIC PANEL
BUN: 16 mg/dL (ref 6–23)
CO2: 28 mEq/L (ref 19–32)
Calcium: 9.8 mg/dL (ref 8.4–10.5)
Chloride: 98 mEq/L (ref 96–112)
Creatinine, Ser: 0.83 mg/dL (ref 0.40–1.20)
GFR: 65.54 mL/min (ref >60.00)
Glucose, Bld: 185 mg/dL — ABNORMAL HIGH (ref 70–99)
Potassium: 4.5 mEq/L (ref 3.5–5.1)
Sodium: 131 mEq/L — ABNORMAL LOW (ref 135–145)

## 2020-02-20 LAB — POCT URINALYSIS DIPSTICK
Bilirubin, UA: NEGATIVE
Glucose, UA: NEGATIVE
Ketones, UA: NEGATIVE
Nitrite, UA: NEGATIVE
Protein, UA: POSITIVE — AB
Spec Grav, UA: 1.02 (ref 1.010–1.025)
Urobilinogen, UA: 0.2 E.U./dL
pH, UA: 6 (ref 5.0–8.0)

## 2020-02-20 LAB — T4, FREE: Free T4: 1.21 ng/dL (ref 0.60–1.60)

## 2020-02-20 LAB — TSH: TSH: 0.17 u[IU]/mL — ABNORMAL LOW (ref 0.35–4.50)

## 2020-02-20 LAB — VITAMIN D 25 HYDROXY (VIT D DEFICIENCY, FRACTURES): VITD: 64.47 ng/mL (ref 30.00–100.00)

## 2020-02-20 LAB — HEPATIC FUNCTION PANEL
ALT: 20 U/L (ref 0–35)
AST: 30 U/L (ref 0–37)
Albumin: 4.1 g/dL (ref 3.5–5.2)
Alkaline Phosphatase: 73 U/L (ref 39–117)
Bilirubin, Direct: 0.1 mg/dL (ref 0.0–0.3)
Total Bilirubin: 0.5 mg/dL (ref 0.2–1.2)
Total Protein: 6.7 g/dL (ref 6.0–8.3)

## 2020-02-20 MED ORDER — SULFAMETHOXAZOLE-TRIMETHOPRIM 800-160 MG PO TABS
1.0000 | ORAL_TABLET | Freq: Two times a day (BID) | ORAL | 0 refills | Status: DC
Start: 2020-02-20 — End: 2020-02-26

## 2020-02-20 NOTE — Progress Notes (Signed)
° °  Subjective:   Patient ID: Jasmine Mccormick, female    DOB: 1934-06-02, 86 y.o.   MRN: [id number]  HPI The patient is an 86 YO female coming in for concerns about UTI. Started last night around 1am with burning when she urinated. She is also having to go more often. She denies fevers or chills. Has ileostomy which is normal. Denies back pain. Overall not improving. Has been drinking more fluids to help.   Review of Systems  Constitutional: Negative.   Respiratory: Negative.   Cardiovascular: Negative.   Gastrointestinal: Negative for abdominal distention, abdominal pain, constipation, diarrhea, nausea and vomiting.  Genitourinary: Positive for dysuria, frequency and urgency.  Musculoskeletal: Negative.   Skin: Negative.     Objective:  Physical Exam Constitutional:      Appearance: She is well-developed.  HENT:     Head: Normocephalic and atraumatic.  Cardiovascular:     Rate and Rhythm: Normal rate and regular rhythm.  Pulmonary:     Effort: Pulmonary effort is normal. No respiratory distress.     Breath sounds: Normal breath sounds. No wheezing or rales.  Abdominal:     General: Bowel sounds are normal. There is no distension.     Palpations: Abdomen is soft.     Tenderness: There is no abdominal tenderness. There is no rebound.  Musculoskeletal:     Cervical back: Normal range of motion.  Skin:    General: Skin is warm and dry.  Neurological:     Mental Status: She is alert and oriented to person, place, and time.     Coordination: Coordination normal.     Vitals:   02/20/20 1301  BP: (!) 178/74  Pulse: 75  Temp: 98 F (36.7 C)  TempSrc: Oral  SpO2: 97%  Weight: 160 lb (72.6 kg)  Height: 5' 5"  (1.651 m)    This visit occurred during the SARS-CoV-2 public health emergency.  Safety protocols were in place, including screening questions prior to the visit, additional usage of staff PPE, and extensive cleaning of exam room while observing appropriate contact  time as indicated for disinfecting solutions.   Assessment & Plan:

## 2020-02-20 NOTE — Addendum Note (Signed)
Addended by: Cresenciano Lick on: 02/20/2020 01:50 PM   Modules accepted: Orders

## 2020-02-20 NOTE — Assessment & Plan Note (Signed)
BP elevated here likely due to infection. She is seeing PCP next week and wishes to just recheck then. Keep same regimen of metoprolol and verapamil and perindopril.

## 2020-02-20 NOTE — Assessment & Plan Note (Signed)
U/A done in the office consistent with infection. Rx bactrim 5 day course.

## 2020-02-20 NOTE — Patient Instructions (Signed)
We have sent in the bactrim to take 1 pill twice a day for 5 days.  We can do the blood work today.

## 2020-02-26 ENCOUNTER — Encounter: Payer: Self-pay | Admitting: Internal Medicine

## 2020-02-26 ENCOUNTER — Other Ambulatory Visit: Payer: Self-pay

## 2020-02-26 ENCOUNTER — Ambulatory Visit (INDEPENDENT_AMBULATORY_CARE_PROVIDER_SITE_OTHER): Payer: Medicare Other | Admitting: Internal Medicine

## 2020-02-26 DIAGNOSIS — E785 Hyperlipidemia, unspecified: Secondary | ICD-10-CM

## 2020-02-26 DIAGNOSIS — M810 Age-related osteoporosis without current pathological fracture: Secondary | ICD-10-CM

## 2020-02-26 DIAGNOSIS — E034 Atrophy of thyroid (acquired): Secondary | ICD-10-CM | POA: Diagnosis not present

## 2020-02-26 DIAGNOSIS — K51818 Other ulcerative colitis with other complication: Secondary | ICD-10-CM

## 2020-02-26 DIAGNOSIS — I10 Essential (primary) hypertension: Secondary | ICD-10-CM

## 2020-02-26 DIAGNOSIS — E538 Deficiency of other specified B group vitamins: Secondary | ICD-10-CM

## 2020-02-26 MED ORDER — METOPROLOL SUCCINATE ER 25 MG PO TB24: ORAL_TABLET | ORAL | 3 refills | Status: DC

## 2020-02-26 MED ORDER — SYNTHROID 50 MCG PO TABS: ORAL_TABLET | ORAL | 3 refills | Status: DC

## 2020-02-26 MED ORDER — PERINDOPRIL ERBUMINE 8 MG PO TABS: 8.0000 mg | ORAL_TABLET | Freq: Every day | ORAL | 3 refills | Status: DC

## 2020-02-26 MED ORDER — DICLOFENAC SODIUM 1 % EX GEL: 1.0000 "application " | Freq: Four times a day (QID) | CUTANEOUS | 3 refills | Status: DC

## 2020-02-26 MED ORDER — SIMVASTATIN 20 MG PO TABS: 20.0000 mg | ORAL_TABLET | Freq: Every day | ORAL | 3 refills | Status: DC

## 2020-02-26 MED ORDER — VERAPAMIL HCL 80 MG PO TABS: 80.0000 mg | ORAL_TABLET | Freq: Two times a day (BID) | ORAL | 3 refills | Status: DC

## 2020-02-26 NOTE — Assessment & Plan Note (Addendum)
Metoprolol, Verapamil, Aceon BP is ok at home - always nl

## 2020-02-26 NOTE — Assessment & Plan Note (Signed)
Labs

## 2020-02-26 NOTE — Assessment & Plan Note (Signed)
Vit D 

## 2020-02-26 NOTE — Assessment & Plan Note (Signed)
Vytorin d/c due to cost Start Simvastatin

## 2020-02-26 NOTE — Assessment & Plan Note (Signed)
On B12 

## 2020-02-26 NOTE — Progress Notes (Signed)
Subjective:  Patient ID: Jasmine Mccormick, female    DOB: 1933/11/30  Age: 86 y.o. MRN: [id number]  CC: No chief complaint on file.   HPI Jasmine Mccormick presents for HTN, colitis, stoma f/u  Outpatient Medications Prior to Visit  Medication Sig Dispense Refill  . Cholecalciferol 1000 units tablet Take 1 tablet (1,000 Units total) by mouth daily. 100 tablet 3  . Cyanocobalamin (VITAMIN B-12) 500 MCG SUBL 1 sl qd 150 tablet 11  . ezetimibe-simvastatin (VYTORIN) 10-20 MG tablet Take 1 tablet by mouth at bedtime. 90 tablet 3  . gentamicin cream (GARAMYCIN) 0.1 % Apply 1 application topically 2 (two) times daily. 15 g 0  . metoprolol succinate (TOPROL-XL) 25 MG 24 hr tablet TAKE 1 TABLET BY MOUTH DAILY AT NIGHT 90 tablet 3  . Multiple Vitamins-Minerals (MULTIVITAMIN,TX-MINERALS) tablet Take 1 tablet by mouth daily.      . perindopril (ACEON) 8 MG tablet Take 1 tablet (8 mg total) by mouth daily. 90 tablet 3  . SYNTHROID 50 MCG tablet Take 75 mcg a day Mon-Fri and 50 mcg a day on Sat and Sun 90 tablet 3  . triamcinolone cream (KENALOG) 0.5 % Apply topically 2 (two) times daily. 30 g 2  . verapamil (CALAN) 80 MG tablet Take 1 tablet (80 mg total) by mouth 2 (two) times daily. 180 tablet 3  . sulfamethoxazole-trimethoprim (BACTRIM DS) 800-160 MG tablet Take 1 tablet by mouth 2 (two) times daily. (Patient not taking: Reported on 02/26/2020) 10 tablet 0   No facility-administered medications prior to visit.    ROS: Review of Systems  Constitutional: Negative for activity change, appetite change, chills, fatigue and unexpected weight change.  HENT: Negative for congestion, mouth sores and sinus pressure.   Eyes: Negative for visual disturbance.  Respiratory: Negative for cough and chest tightness.   Gastrointestinal: Negative for abdominal pain and nausea.  Genitourinary: Negative for difficulty urinating, frequency and vaginal pain.  Musculoskeletal: Negative for back pain and gait  problem.  Skin: Negative for pallor and rash.  Neurological: Negative for dizziness, tremors, weakness, numbness and headaches.  Psychiatric/Behavioral: Negative for confusion and sleep disturbance.    Objective:  BP (!) 178/86 (BP Location: Right Arm, Patient Position: Sitting, Cuff Size: Normal)   Pulse 72   Temp 97.9 F (36.6 C) (Oral)   Ht 5' 5"  (1.651 m)   Wt 158 lb (71.7 kg)   SpO2 97%   BMI 26.29 kg/m   BP Readings from Last 3 Encounters:  02/26/20 (!) 178/86  02/20/20 (!) 178/74  09/03/19 (!) 182/84    Wt Readings from Last 3 Encounters:  02/26/20 158 lb (71.7 kg)  02/20/20 160 lb (72.6 kg)  09/03/19 157 lb (71.2 kg)    Physical Exam Constitutional:      General: She is not in acute distress.    Appearance: She is well-developed.  HENT:     Head: Normocephalic.     Right Ear: External ear normal.     Left Ear: External ear normal.     Nose: Nose normal.  Eyes:     General:        Right eye: No discharge.        Left eye: No discharge.     Conjunctiva/sclera: Conjunctivae normal.     Pupils: Pupils are equal, round, and reactive to light.  Neck:     Thyroid: No thyromegaly.     Vascular: No JVD.     Trachea: No tracheal deviation.  Cardiovascular:     Rate and Rhythm: Normal rate and regular rhythm.     Heart sounds: Normal heart sounds.  Pulmonary:     Effort: No respiratory distress.     Breath sounds: No stridor. No wheezing.  Abdominal:     General: Bowel sounds are normal. There is no distension.     Palpations: Abdomen is soft. There is no mass.     Tenderness: There is no abdominal tenderness. There is no guarding or rebound.  Musculoskeletal:        General: No tenderness.     Cervical back: Normal range of motion and neck supple.  Lymphadenopathy:     Cervical: No cervical adenopathy.  Skin:    Findings: No erythema or rash.  Neurological:     Cranial Nerves: No cranial nerve deficit.     Motor: No abnormal muscle tone.      Coordination: Coordination normal.     Deep Tendon Reflexes: Reflexes normal.  Psychiatric:        Behavior: Behavior normal.        Thought Content: Thought content normal.        Judgment: Judgment normal.   stoma bag  Lab Results  Component Value Date   WBC 11.0 (H) 02/20/2020   HGB 13.6 02/20/2020   HCT 40.8 02/20/2020   PLT 248.0 02/20/2020   GLUCOSE 185 (H) 02/20/2020   CHOL 164 02/20/2020   TRIG 88.0 02/20/2020   HDL 68.80 02/20/2020   LDLCALC 78 02/20/2020   ALT 20 02/20/2020   AST 30 02/20/2020   NA 131 (L) 02/20/2020   K 4.5 02/20/2020   CL 98 02/20/2020   CREATININE 0.83 02/20/2020   BUN 16 02/20/2020   CO2 28 02/20/2020   TSH 0.17 (L) 02/20/2020   HGBA1C 6.2 08/13/2013    DG Forearm Left  Result Date: 06/25/2018 CLINICAL DATA:  Fall 2 days ago.  Generalized arm pain. EXAM: LEFT FOREARM - 2 VIEW COMPARISON:  Wrist series performed today FINDINGS: There is a fracture in the distal left radius, best seen posteriorly on the lateral view. No visible ulnar abnormality. Soft tissues are intact. IMPRESSION: Nondisplaced distal left radial fracture. Electronically Signed   By: Rolm Baptise M.D.   On: 06/25/2018 10:27   DG Wrist Complete Left  Result Date: 06/25/2018 CLINICAL DATA:  Fall, generalized wrist pain EXAM: LEFT WRIST - COMPLETE 3+ VIEW COMPARISON:  None. FINDINGS: There is a nondisplaced fracture noted posteriorly on the lateral view felt represent at posterior distal left radial fracture. No visible ulnar fracture. No subluxation or dislocation. Posterior soft tissue swelling. IMPRESSION: Nondisplaced distal left radial fracture best seen posteriorly on the lateral view. Electronically Signed   By: Rolm Baptise M.D.   On: 06/25/2018 10:26   DG Hand Complete Left  Result Date: 06/25/2018 CLINICAL DATA:  Fall, left hand pain, bruising EXAM: LEFT HAND - COMPLETE 3+ VIEW COMPARISON:  None. FINDINGS: There is a nondisplaced fracture noted posteriorly on the  lateral view felt represent at posterior distal left radial fracture. No visible ulnar fracture or additional fracture within the left hand. No subluxation or dislocation. Posterior soft tissue swelling. Early osteoarthritis in the IP joints. IMPRESSION: Distal left radial fracture, best seen posteriorly on the lateral view. Electronically Signed   By: Rolm Baptise M.D.   On: 06/25/2018 10:27    Assessment & Plan:   Follow-up: No follow-ups on file.  Walker Kehr, MD

## 2020-02-26 NOTE — Assessment & Plan Note (Signed)
Ileostomy bag

## 2020-04-02 ENCOUNTER — Other Ambulatory Visit: Payer: Self-pay

## 2020-04-02 ENCOUNTER — Encounter: Payer: Self-pay | Admitting: Internal Medicine

## 2020-04-02 ENCOUNTER — Ambulatory Visit (INDEPENDENT_AMBULATORY_CARE_PROVIDER_SITE_OTHER): Payer: Medicare Other | Admitting: Internal Medicine

## 2020-04-02 ENCOUNTER — Ambulatory Visit (INDEPENDENT_AMBULATORY_CARE_PROVIDER_SITE_OTHER): Payer: Medicare Other

## 2020-04-02 VITALS — BP 162/66 | HR 69 | Temp 97.6°F | Resp 16 | Ht 65.0 in | Wt 160.1 lb

## 2020-04-02 DIAGNOSIS — M25551 Pain in right hip: Secondary | ICD-10-CM | POA: Diagnosis not present

## 2020-04-02 DIAGNOSIS — B029 Zoster without complications: Secondary | ICD-10-CM | POA: Diagnosis not present

## 2020-04-02 MED ORDER — VALACYCLOVIR HCL 1 G PO TABS: 1000.0000 mg | ORAL_TABLET | Freq: Two times a day (BID) | ORAL | 0 refills | Status: AC

## 2020-04-02 MED ORDER — PREDNISONE 5 MG PO TABS: 15.0000 mg | ORAL_TABLET | Freq: Two times a day (BID) | ORAL | 0 refills | Status: AC

## 2020-04-02 MED ORDER — OXYCODONE HCL 5 MG PO TABS: 5.0000 mg | ORAL_TABLET | ORAL | 0 refills | Status: AC | PRN

## 2020-04-02 MED ORDER — PREDNISONE 10 MG PO TABS: 30.0000 mg | ORAL_TABLET | Freq: Two times a day (BID) | ORAL | 0 refills | Status: AC

## 2020-04-02 MED ORDER — PREDNISONE 2.5 MG PO TABS: 7.5000 mg | ORAL_TABLET | Freq: Two times a day (BID) | ORAL | 0 refills | Status: AC

## 2020-04-02 NOTE — Patient Instructions (Signed)
Shingles  Shingles, which is also known as herpes zoster, is an infection that causes a painful skin rash and fluid-filled blisters. It is caused by a virus. Shingles only develops in people who:  Have had chickenpox.  Have been given a medicine to protect against chickenpox (have been vaccinated). Shingles is rare in this group. What are the causes? Shingles is caused by varicella-zoster virus (VZV). This is the same virus that causes chickenpox. After a person is exposed to VZV, the virus stays in the body in an inactive (dormant) state. Shingles develops if the virus is reactivated. This can happen many years after the first (initial) exposure to VZV. It is not known what causes this virus to be reactivated. What increases the risk? People who have had chickenpox or received the chickenpox vaccine are at risk for shingles. Shingles infection is more common in people who:  Are older than age 60.  Have a weakened disease-fighting system (immune system), such as people with: ? HIV. ? AIDS. ? Cancer.  Are taking medicines that weaken the immune system, such as transplant medicines.  Are experiencing a lot of stress. What are the signs or symptoms? Early symptoms of this condition include itching, tingling, and pain in an area on your skin. Pain may be described as burning, stabbing, or throbbing. A few days or weeks after early symptoms start, a painful red rash appears. The rash is usually on one side of the body and has a band-like or belt-like pattern. The rash eventually turns into fluid-filled blisters that break open, change into scabs, and dry up in about 2-3 weeks. At any time during the infection, you may also develop:  A fever.  Chills.  A headache.  An upset stomach. How is this diagnosed? This condition is diagnosed with a skin exam. Skin or fluid samples may be taken from the blisters before a diagnosis is made. These samples are examined under a microscope or sent to  a lab for testing. How is this treated? The rash may last for several weeks. There is not a specific cure for this condition. Your health care provider will probably prescribe medicines to help you manage pain, recover more quickly, and avoid long-term problems. Medicines may include:  Antiviral drugs.  Anti-inflammatory drugs.  Pain medicines.  Anti-itching medicines (antihistamines). If the area involved is on your face, you may be referred to a specialist, such as an eye doctor (ophthalmologist) or an ear, nose, and throat (ENT) doctor (otolaryngologist) to help you avoid eye problems, chronic pain, or disability. Follow these instructions at home: Medicines  Take over-the-counter and prescription medicines only as told by your health care provider.  Apply an anti-itch cream or numbing cream to the affected area as told by your health care provider. Relieving itching and discomfort   Apply cold, wet cloths (cold compresses) to the area of the rash or blisters as told by your health care provider.  Cool baths can be soothing. Try adding baking soda or dry oatmeal to the water to reduce itching. Do not bathe in hot water. Blister and rash care  Keep your rash covered with a loose bandage (dressing). Wear loose-fitting clothing to help ease the pain of material rubbing against the rash.  Keep your rash and blisters clean by washing the area with mild soap and cool water as told by your health care provider.  Check your rash every day for signs of infection. Check for: ? More redness, swelling, or pain. ? Fluid   or blood. ? Warmth. ? Pus or a bad smell.  Do not scratch your rash or pick at your blisters. To help avoid scratching: ? Keep your fingernails clean and cut short. ? Wear gloves or mittens while you sleep, if scratching is a problem. General instructions  Rest as told by your health care provider.  Keep all follow-up visits as told by your health care provider. This  is important.  Wash your hands often with soap and water. If soap and water are not available, use hand sanitizer. Doing this lowers your chance of getting a bacterial skin infection.  Before your blisters change into scabs, your shingles infection can cause chickenpox in people who have never had it or have never been vaccinated against it. To prevent this from happening, avoid contact with other people, especially: ? Babies. ? Pregnant women. ? Children who have eczema. ? Elderly people who have transplants. ? People who have chronic illnesses, such as cancer or AIDS. Contact a health care provider if:  Your pain is not relieved with prescribed medicines.  Your pain does not get better after the rash heals.  You have signs of infection in the rash area, such as: ? More redness, swelling, or pain around the rash. ? Fluid or blood coming from the rash. ? The rash area feeling warm to the touch. ? Pus or a bad smell coming from the rash. Get help right away if:  The rash is on your face or nose.  You have facial pain, pain around your eye area, or loss of feeling on one side of your face.  You have difficulty seeing.  You have ear pain or have ringing in your ear.  You have a loss of taste.  Your condition gets worse. Summary  Shingles, which is also known as herpes zoster, is an infection that causes a painful skin rash and fluid-filled blisters.  This condition is diagnosed with a skin exam. Skin or fluid samples may be taken from the blisters and examined before the diagnosis is made.  Keep your rash covered with a loose bandage (dressing). Wear loose-fitting clothing to help ease the pain of material rubbing against the rash.  Before your blisters change into scabs, your shingles infection can cause chickenpox in people who have never had it or have never been vaccinated against it. This information is not intended to replace advice given to you by your health care  provider. Make sure you discuss any questions you have with your health care provider. Document Revised: 11/23/2018 Document Reviewed: 04/05/2017 Elsevier Patient Education  2020 [employer]  

## 2020-04-02 NOTE — Progress Notes (Signed)
Subjective:  Patient ID: Jasmine Mccormick, female    DOB: 04/03/1934  Age: 86 y.o. MRN: [id number]  CC: Rash    This visit occurred during the SARS-CoV-2 public health emergency.  Safety protocols were in place, including screening questions prior to the visit, additional usage of staff PPE, and extensive cleaning of exam room while observing appropriate contact time as indicated for disinfecting solutions.    HPI Jasmine Mccormick presents for a 6 day hx of right hip/thigh pain and rash. She denies trauma or injury.  Outpatient Medications Prior to Visit  Medication Sig Dispense Refill  . Cholecalciferol 1000 units tablet Take 1 tablet (1,000 Units total) by mouth daily. 100 tablet 3  . Cyanocobalamin (VITAMIN B-12) 500 MCG SUBL 1 sl qd 150 tablet 11  . diclofenac Sodium (VOLTAREN) 1 % GEL Apply 1 application topically 4 (four) times daily. 100 g 3  . gentamicin cream (GARAMYCIN) 0.1 % Apply 1 application topically 2 (two) times daily. 15 g 0  . metoprolol succinate (TOPROL-XL) 25 MG 24 hr tablet TAKE 1 TABLET BY MOUTH DAILY AT NIGHT 90 tablet 3  . Multiple Vitamins-Minerals (MULTIVITAMIN,TX-MINERALS) tablet Take 1 tablet by mouth daily.      . perindopril (ACEON) 8 MG tablet Take 1 tablet (8 mg total) by mouth daily. 90 tablet 3  . simvastatin (ZOCOR) 20 MG tablet Take 1 tablet (20 mg total) by mouth at bedtime. 90 tablet 3  . SYNTHROID 50 MCG tablet Take 75 mcg a day Mon-Fri and 50 mcg a day on Sat and Sun 90 tablet 3  . triamcinolone cream (KENALOG) 0.5 % Apply topically 2 (two) times daily. 30 g 2  . verapamil (CALAN) 80 MG tablet Take 1 tablet (80 mg total) by mouth 2 (two) times daily. 180 tablet 3   No facility-administered medications prior to visit.    ROS Review of Systems  Constitutional: Negative.  Negative for chills, fatigue and fever.  HENT: Negative.   Eyes: Negative.   Respiratory: Negative for cough, chest tightness, shortness of breath and wheezing.     Cardiovascular: Negative for chest pain, palpitations and leg swelling.  Gastrointestinal: Negative for abdominal pain, constipation, diarrhea, nausea and vomiting.  Endocrine: Negative.   Genitourinary: Negative.  Negative for difficulty urinating and dysuria.  Musculoskeletal: Positive for arthralgias. Negative for back pain, myalgias and neck pain.  Skin: Positive for rash.  Neurological: Negative.  Negative for dizziness, weakness and light-headedness.  Hematological: Negative for adenopathy. Does not bruise/bleed easily.  Psychiatric/Behavioral: Negative.     Objective:  BP (!) 162/66 (BP Location: Left Arm, Patient Position: Sitting, Cuff Size: Normal)   Pulse 69   Temp 97.6 F (36.4 C) (Oral)   Resp 16   Ht 5' 5"  (1.651 m)   Wt 160 lb 2 oz (72.6 kg)   SpO2 97%   BMI 26.65 kg/m   BP Readings from Last 3 Encounters:  04/02/20 (!) 162/66  02/26/20 (!) 178/86  02/20/20 (!) 178/74    Wt Readings from Last 3 Encounters:  04/02/20 160 lb 2 oz (72.6 kg)  02/26/20 158 lb (71.7 kg)  02/20/20 160 lb (72.6 kg)    Physical Exam Vitals reviewed.  Constitutional:      Appearance: Normal appearance.  HENT:     Nose: Nose normal.     Mouth/Throat:     Mouth: Mucous membranes are moist.  Eyes:     General: No scleral icterus.    Conjunctiva/sclera: Conjunctivae normal.  Cardiovascular:     Rate and Rhythm: Normal rate and regular rhythm.  Pulmonary:     Breath sounds: No stridor. No wheezing, rhonchi or rales.  Abdominal:     General: Abdomen is flat.     Palpations: There is no mass.     Tenderness: There is no abdominal tenderness. There is no guarding.  Musculoskeletal:     Cervical back: Normal and neck supple.     Thoracic back: Normal.     Lumbar back: No swelling, spasms, tenderness or bony tenderness. Normal range of motion. Negative right straight leg raise test and negative left straight leg raise test.  Lymphadenopathy:     Cervical: No cervical  adenopathy.  Skin:    Findings: Erythema and rash present. Rash is vesicular.     Comments: Along the right upper inner thigh there are erythematous macules and patches.  Distal to this extending down to the medial calf are several areas of vesicles on an erythematous base.  See photo.  Neurological:     Mental Status: She is alert.     Lab Results  Component Value Date   WBC 11.0 (H) 02/20/2020   HGB 13.6 02/20/2020   HCT 40.8 02/20/2020   PLT 248.0 02/20/2020   GLUCOSE 185 (H) 02/20/2020   CHOL 164 02/20/2020   TRIG 88.0 02/20/2020   HDL 68.80 02/20/2020   LDLCALC 78 02/20/2020   ALT 20 02/20/2020   AST 30 02/20/2020   NA 131 (L) 02/20/2020   K 4.5 02/20/2020   CL 98 02/20/2020   CREATININE 0.83 02/20/2020   BUN 16 02/20/2020   CO2 28 02/20/2020   TSH 0.17 (L) 02/20/2020   HGBA1C 6.2 08/13/2013    DG Forearm Left  Result Date: 06/25/2018 CLINICAL DATA:  Fall 2 days ago.  Generalized arm pain. EXAM: LEFT FOREARM - 2 VIEW COMPARISON:  Wrist series performed today FINDINGS: There is a fracture in the distal left radius, best seen posteriorly on the lateral view. No visible ulnar abnormality. Soft tissues are intact. IMPRESSION: Nondisplaced distal left radial fracture. Electronically Signed   By: Rolm Baptise M.D.   On: 06/25/2018 10:27   DG Wrist Complete Left  Result Date: 06/25/2018 CLINICAL DATA:  Fall, generalized wrist pain EXAM: LEFT WRIST - COMPLETE 3+ VIEW COMPARISON:  None. FINDINGS: There is a nondisplaced fracture noted posteriorly on the lateral view felt represent at posterior distal left radial fracture. No visible ulnar fracture. No subluxation or dislocation. Posterior soft tissue swelling. IMPRESSION: Nondisplaced distal left radial fracture best seen posteriorly on the lateral view. Electronically Signed   By: Rolm Baptise M.D.   On: 06/25/2018 10:26   DG Hand Complete Left  Result Date: 06/25/2018 CLINICAL DATA:  Fall, left hand pain, bruising EXAM:  LEFT HAND - COMPLETE 3+ VIEW COMPARISON:  None. FINDINGS: There is a nondisplaced fracture noted posteriorly on the lateral view felt represent at posterior distal left radial fracture. No visible ulnar fracture or additional fracture within the left hand. No subluxation or dislocation. Posterior soft tissue swelling. Early osteoarthritis in the IP joints. IMPRESSION: Distal left radial fracture, best seen posteriorly on the lateral view. Electronically Signed   By: Rolm Baptise M.D.   On: 06/25/2018 10:27   DG Hip Unilat W OR W/O Pelvis 2-3 Views Right  Result Date: 04/02/2020 CLINICAL DATA:  Atraumatic hip pain for 4 days EXAM: DG HIP (WITH OR WITHOUT PELVIS) 2-3V RIGHT COMPARISON:  None. FINDINGS: There is no evidence of  hip fracture or dislocation. There is no evidence of arthropathy or other focal bone abnormality. Osteopenic appearance. IMPRESSION: Negative. Electronically Signed   By: Monte Fantasia M.D.   On: 04/02/2020 11:16    Assessment & Plan:   Laurian was seen today for rash.  Diagnoses and all orders for this visit:  Thigh shingles- Will treat the infection with valacyclovir.  I recommended a 9-day course of prednisone to reduce the misery of shingles and she will take oxycodone as needed for the pain. -     valACYclovir (VALTREX) 1000 MG tablet; Take 1 tablet (1,000 mg total) by mouth 2 (two) times daily for 7 days. -     predniSONE (DELTASONE) 10 MG tablet; Take 3 tablets (30 mg total) by mouth 2 (two) times daily with a meal for 3 days. -     predniSONE (DELTASONE) 2.5 MG tablet; Take 3 tablets (7.5 mg total) by mouth 2 (two) times daily with a meal for 3 days. -     predniSONE (DELTASONE) 5 MG tablet; Take 3 tablets (15 mg total) by mouth 2 (two) times daily with a meal for 3 days. -     oxyCODONE (OXY IR/ROXICODONE) 5 MG immediate release tablet; Take 1 tablet (5 mg total) by mouth every 4 (four) hours as needed for up to 7 days for severe pain.  Acute right hip pain-  Examination and x-ray of the right hip is unremarkable.  I think the pain is the result of the shingles. -     DG Hip Unilat W OR W/O Pelvis 2-3 Views Right; Future   I am having Shawnice A. Hafley start on valACYclovir, predniSONE, predniSONE, predniSONE, and oxyCODONE. I am also having her maintain her (multivitamin,tx-minerals), Vitamin B-12, triamcinolone cream, Cholecalciferol, gentamicin cream, Synthroid, verapamil, perindopril, metoprolol succinate, simvastatin, and diclofenac Sodium.  Meds ordered this encounter  Medications  . valACYclovir (VALTREX) 1000 MG tablet    Sig: Take 1 tablet (1,000 mg total) by mouth 2 (two) times daily for 7 days.    Dispense:  14 tablet    Refill:  0  . predniSONE (DELTASONE) 10 MG tablet    Sig: Take 3 tablets (30 mg total) by mouth 2 (two) times daily with a meal for 3 days.    Dispense:  18 tablet    Refill:  0  . predniSONE (DELTASONE) 2.5 MG tablet    Sig: Take 3 tablets (7.5 mg total) by mouth 2 (two) times daily with a meal for 3 days.    Dispense:  18 tablet    Refill:  0  . predniSONE (DELTASONE) 5 MG tablet    Sig: Take 3 tablets (15 mg total) by mouth 2 (two) times daily with a meal for 3 days.    Dispense:  18 tablet    Refill:  0  . oxyCODONE (OXY IR/ROXICODONE) 5 MG immediate release tablet    Sig: Take 1 tablet (5 mg total) by mouth every 4 (four) hours as needed for up to 7 days for severe pain.    Dispense:  30 tablet    Refill:  0     Follow-up: Return in about 3 weeks (around 04/23/2020).  Scarlette Calico, MD

## 2020-04-15 ENCOUNTER — Ambulatory Visit: Payer: Medicare Other | Admitting: Podiatry

## 2020-04-29 ENCOUNTER — Ambulatory Visit (INDEPENDENT_AMBULATORY_CARE_PROVIDER_SITE_OTHER): Payer: Medicare Other | Admitting: Podiatry

## 2020-04-29 ENCOUNTER — Other Ambulatory Visit: Payer: Self-pay

## 2020-04-29 ENCOUNTER — Encounter: Payer: Self-pay | Admitting: Podiatry

## 2020-04-29 DIAGNOSIS — B351 Tinea unguium: Secondary | ICD-10-CM | POA: Diagnosis not present

## 2020-04-29 DIAGNOSIS — M79674 Pain in right toe(s): Secondary | ICD-10-CM

## 2020-04-29 DIAGNOSIS — M79675 Pain in left toe(s): Secondary | ICD-10-CM | POA: Diagnosis not present

## 2020-05-03 NOTE — Progress Notes (Signed)
Subjective: Jasmine Mccormick is a 86 y.o. female patient seen today painful mycotic nails b/l that are difficult to trim. Pain interferes with ambulation. Aggravating factors include wearing enclosed shoe gear. Pain is relieved with periodic professional debridement.  Patient states for the past two weeks she is recovering from shingles which developed on her right leg. She states she is under care of PCP, but it is very painful.   Patient Active Problem List   Diagnosis Date Noted  . Thigh shingles 04/02/2020  . Acute right hip pain 04/02/2020  . Dysuria 08/18/2017  . Hearing loss of both ears due to cerumen impaction 09/03/2015  . Callus of foot 09/03/2015  . Low vitamin B12 level 09/03/2015  . Right-sided face pain 08/21/2014  . Well adult exam 07/04/2012  . Hyperglycemia 07/04/2012  . Hand dermatitis 06/22/2011  . Hypothyroidism 12/07/2010  . Actinic keratosis 05/04/2010  . NEOPLASM, SKIN, UNCERTAIN BEHAVIOR 35/46/5681  . CERUMEN IMPACTION 09/26/2007  . RASH-NONVESICULAR 06/12/2007  . Dyslipidemia 03/19/2007  . [place] DISEASE 03/19/2007  . Essential hypertension 03/19/2007  . Ulcerative colitis ([place]) 03/19/2007  . Osteoporosis 03/19/2007    Current Outpatient Medications on File Prior to Visit  Medication Sig Dispense Refill  . Cholecalciferol 1000 units tablet Take 1 tablet (1,000 Units total) by mouth daily. 100 tablet 3  . Cyanocobalamin (VITAMIN B-12) 500 MCG SUBL 1 sl qd 150 tablet 11  . diclofenac Sodium (VOLTAREN) 1 % GEL Apply 1 application topically 4 (four) times daily. 100 g 3  . gentamicin cream (GARAMYCIN) 0.1 % Apply 1 application topically 2 (two) times daily. 15 g 0  . metoprolol succinate (TOPROL-XL) 25 MG 24 hr tablet TAKE 1 TABLET BY MOUTH DAILY AT NIGHT 90 tablet 3  . Multiple Vitamins-Minerals (MULTIVITAMIN,TX-MINERALS) tablet Take 1 tablet by mouth daily.      . perindopril (ACEON) 8 MG tablet Take 1 tablet (8 mg total) by mouth daily. 90 tablet 3   . simvastatin (ZOCOR) 20 MG tablet Take 1 tablet (20 mg total) by mouth at bedtime. 90 tablet 3  . SYNTHROID 50 MCG tablet Take 75 mcg a day Mon-Fri and 50 mcg a day on Sat and Sun 90 tablet 3  . triamcinolone cream (KENALOG) 0.5 % Apply topically 2 (two) times daily. 30 g 2  . verapamil (CALAN) 80 MG tablet Take 1 tablet (80 mg total) by mouth 2 (two) times daily. 180 tablet 3   No current facility-administered medications on file prior to visit.    No Known Allergies  Objective: Physical Exam  General: Jasmine Mccormick is a pleasant 86 y.o. Caucasian female, WD, WN in NAD. AAO x 3.   Vascular:  Neurovascular status unchanged b/l. Capillary refill time to digits immediate b/l. Palpable DP pulses b/l. Palpable PT pulses b/l. Pedal hair present b/l. Skin temperature gradient within normal limits b/l.  Dermatological:  Pedal skin with normal turgor, texture and tone bilaterally. No open wounds bilaterally. No interdigital macerations bilaterally. Toenails 1-5 b/l elongated, discolored, dystrophic, thickened, crumbly with subungual debris and tenderness to dorsal palpation.  Musculoskeletal:  Normal muscle strength 5/5 to all lower extremity muscle groups bilaterally. No gross bony deformities bilaterally. No pain crepitus or joint limitation noted with ROM b/l.  Neurological:  Protective sensation intact 5/5 intact bilaterally with 10g monofilament b/l. Vibratory sensation intact b/l. Proprioception intact bilaterally.  Assessment and Plan:  1. Pain due to onychomycosis of toenails of both feet   -Examined patient. -Toenails 1-5 b/l were debrided  in length and girth with sterile nail nippers and dremel without iatrogenic bleeding.  -Patient to continue soft, supportive shoe gear daily. -Patient to report any pedal injuries to medical professional immediately. -Patient/POA to call should there be question/concern in the interim.  Return in about 3 months (around 07/29/2020) for  nail and callus trim.  Marzetta Board, DPM

## 2020-05-13 DIAGNOSIS — Z23 Encounter for immunization: Secondary | ICD-10-CM | POA: Diagnosis not present

## 2020-06-10 DIAGNOSIS — Z23 Encounter for immunization: Secondary | ICD-10-CM | POA: Diagnosis not present

## 2020-07-31 ENCOUNTER — Ambulatory Visit (INDEPENDENT_AMBULATORY_CARE_PROVIDER_SITE_OTHER): Payer: Medicare Other | Admitting: Podiatry

## 2020-07-31 ENCOUNTER — Other Ambulatory Visit: Payer: Self-pay

## 2020-07-31 DIAGNOSIS — B351 Tinea unguium: Secondary | ICD-10-CM | POA: Diagnosis not present

## 2020-07-31 DIAGNOSIS — M79675 Pain in left toe(s): Secondary | ICD-10-CM

## 2020-07-31 DIAGNOSIS — M79674 Pain in right toe(s): Secondary | ICD-10-CM

## 2020-08-03 ENCOUNTER — Encounter: Payer: Self-pay | Admitting: Podiatry

## 2020-08-03 NOTE — Progress Notes (Signed)
Subjective: Jasmine Mccormick is a 86 y.o. female patient seen today painful mycotic nails b/l that are difficult to trim. Pain interferes with ambulation. Aggravating factors include wearing enclosed shoe gear. Pain is relieved with periodic professional debridement.  Patient voices no new pedal problems on today's visit.  Patient Active Problem List   Diagnosis Date Noted  . Thigh shingles 04/02/2020  . Acute right hip pain 04/02/2020  . Dysuria 08/18/2017  . Hearing loss of both ears due to cerumen impaction 09/03/2015  . Callus of foot 09/03/2015  . Low vitamin B12 level 09/03/2015  . Right-sided face pain 08/21/2014  . Well adult exam 07/04/2012  . Hyperglycemia 07/04/2012  . Hand dermatitis 06/22/2011  . Hypothyroidism 12/07/2010  . Actinic keratosis 05/04/2010  . NEOPLASM, SKIN, UNCERTAIN BEHAVIOR 94/70/9628  . CERUMEN IMPACTION 09/26/2007  . RASH-NONVESICULAR 06/12/2007  . Dyslipidemia 03/19/2007  . [place] DISEASE 03/19/2007  . Essential hypertension 03/19/2007  . Ulcerative colitis ([place]) 03/19/2007  . Osteoporosis 03/19/2007    Current Outpatient Medications on File Prior to Visit  Medication Sig Dispense Refill  . Cholecalciferol 1000 units tablet Take 1 tablet (1,000 Units total) by mouth daily. 100 tablet 3  . Cyanocobalamin (VITAMIN B-12) 500 MCG SUBL 1 sl qd 150 tablet 11  . diclofenac Sodium (VOLTAREN) 1 % GEL Apply 1 application topically 4 (four) times daily. 100 g 3  . gentamicin cream (GARAMYCIN) 0.1 % Apply 1 application topically 2 (two) times daily. 15 g 0  . metoprolol succinate (TOPROL-XL) 25 MG 24 hr tablet TAKE 1 TABLET BY MOUTH DAILY AT NIGHT 90 tablet 3  . Multiple Vitamins-Minerals (MULTIVITAMIN,TX-MINERALS) tablet Take 1 tablet by mouth daily.      . perindopril (ACEON) 8 MG tablet Take 1 tablet (8 mg total) by mouth daily. 90 tablet 3  . simvastatin (ZOCOR) 20 MG tablet Take 1 tablet (20 mg total) by mouth at bedtime. 90 tablet 3  . SYNTHROID  50 MCG tablet Take 75 mcg a day Mon-Fri and 50 mcg a day on Sat and Sun 90 tablet 3  . triamcinolone cream (KENALOG) 0.5 % Apply topically 2 (two) times daily. 30 g 2  . verapamil (CALAN) 80 MG tablet Take 1 tablet (80 mg total) by mouth 2 (two) times daily. 180 tablet 3   No current facility-administered medications on file prior to visit.    No Known Allergies  Objective: Physical Exam  General: ANAIKA Mccormick is a pleasant 86 y.o. Caucasian female, WD, WN in NAD. AAO x 3.   Vascular:  Neurovascular status unchanged b/l. Capillary refill time to digits immediate b/l. Palpable DP pulses b/l. Palpable PT pulses b/l. Pedal hair present b/l. Skin temperature gradient within normal limits b/l.  Dermatological:  Pedal skin with normal turgor, texture and tone bilaterally. No open wounds bilaterally. No interdigital macerations bilaterally. Toenails 1-5 b/l elongated, discolored, dystrophic, thickened, crumbly with subungual debris and tenderness to dorsal palpation.  Musculoskeletal:  Normal muscle strength 5/5 to all lower extremity muscle groups bilaterally. No gross bony deformities bilaterally. No pain crepitus or joint limitation noted with ROM b/l.  Neurological:  Protective sensation intact 5/5 intact bilaterally with 10g monofilament b/l. Vibratory sensation intact b/l. Proprioception intact bilaterally.  Assessment and Plan:  1. Pain due to onychomycosis of toenails of both feet    -Examined patient. -Toenails 1-5 b/l were debrided in length and girth with sterile nail nippers and dremel without iatrogenic bleeding.  -Patient to continue soft, supportive shoe gear daily. -  Patient to report any pedal injuries to medical professional immediately. -Patient/POA to call should there be question/concern in the interim.  Return in about 3 months (around 10/29/2020).  Marzetta Board, DPM

## 2020-08-17 ENCOUNTER — Telehealth: Payer: Self-pay | Admitting: Internal Medicine

## 2020-08-17 DIAGNOSIS — K51818 Other ulcerative colitis with other complication: Secondary | ICD-10-CM

## 2020-08-17 DIAGNOSIS — I10 Essential (primary) hypertension: Secondary | ICD-10-CM

## 2020-08-17 DIAGNOSIS — E785 Hyperlipidemia, unspecified: Secondary | ICD-10-CM

## 2020-08-17 NOTE — Telephone Encounter (Signed)
Patient wondering if she needs to get her labs done early before her appointment on 01.19.22 [phone]

## 2020-08-17 NOTE — Telephone Encounter (Signed)
Notified pt MD has placed order for labs. She will go to [facility] to have done.Marland KitchenJohny Chess

## 2020-08-17 NOTE — Telephone Encounter (Signed)
Okay.  Labs ordered.  Thanks

## 2020-08-28 ENCOUNTER — Other Ambulatory Visit (INDEPENDENT_AMBULATORY_CARE_PROVIDER_SITE_OTHER): Payer: Medicare Other

## 2020-08-28 DIAGNOSIS — K51818 Other ulcerative colitis with other complication: Secondary | ICD-10-CM

## 2020-08-28 DIAGNOSIS — E785 Hyperlipidemia, unspecified: Secondary | ICD-10-CM | POA: Diagnosis not present

## 2020-08-28 DIAGNOSIS — I10 Essential (primary) hypertension: Secondary | ICD-10-CM | POA: Diagnosis not present

## 2020-08-28 LAB — CBC WITH DIFFERENTIAL/PLATELET
Basophils Absolute: 0 10*3/uL (ref 0.0–0.1)
Basophils Relative: 0.7 % (ref 0.0–3.0)
Eosinophils Absolute: 0.1 10*3/uL (ref 0.0–0.7)
Eosinophils Relative: 2.4 % (ref 0.0–5.0)
HCT: 43.9 % (ref 36.0–46.0)
Hemoglobin: 14.9 g/dL (ref 12.0–15.0)
Lymphocytes Relative: 16.5 % (ref 12.0–46.0)
Lymphs Abs: 1 10*3/uL (ref 0.7–4.0)
MCHC: 34 g/dL (ref 30.0–36.0)
MCV: 93.3 fl (ref 78.0–100.0)
Monocytes Absolute: 0.6 10*3/uL (ref 0.1–1.0)
Monocytes Relative: 10.9 % (ref 3.0–12.0)
Neutro Abs: 4.1 10*3/uL (ref 1.4–7.7)
Neutrophils Relative %: 69.5 % (ref 43.0–77.0)
Platelets: 263 10*3/uL (ref 150.0–400.0)
RBC: 4.7 Mil/uL (ref 3.87–5.11)
RDW: 12.6 % (ref 11.5–15.5)
WBC: 5.9 10*3/uL (ref 4.0–10.5)

## 2020-08-28 LAB — COMPREHENSIVE METABOLIC PANEL
ALT: 15 U/L (ref 0–35)
AST: 22 U/L (ref 0–37)
Albumin: 4.2 g/dL (ref 3.5–5.2)
Alkaline Phosphatase: 68 U/L (ref 39–117)
BUN: 18 mg/dL (ref 6–23)
CO2: 24 mEq/L (ref 19–32)
Calcium: 9.7 mg/dL (ref 8.4–10.5)
Chloride: 102 mEq/L (ref 96–112)
Creatinine, Ser: 0.84 mg/dL (ref 0.40–1.20)
GFR: 63.89 mL/min (ref >60.00)
Glucose, Bld: 107 mg/dL — ABNORMAL HIGH (ref 70–99)
Potassium: 4.2 mEq/L (ref 3.5–5.1)
Sodium: 136 mEq/L (ref 135–145)
Total Bilirubin: 0.5 mg/dL (ref 0.2–1.2)
Total Protein: 6.7 g/dL (ref 6.0–8.3)

## 2020-08-28 LAB — TSH: TSH: 0.97 u[IU]/mL (ref 0.35–4.50)

## 2020-08-28 LAB — URINALYSIS
Bilirubin Urine: NEGATIVE
Hgb urine dipstick: NEGATIVE
Ketones, ur: NEGATIVE
Leukocytes,Ua: NEGATIVE
Nitrite: NEGATIVE
Specific Gravity, Urine: 1.015 (ref 1.000–1.030)
Total Protein, Urine: NEGATIVE
Urine Glucose: NEGATIVE
Urobilinogen, UA: 0.2 (ref 0.0–1.0)
pH: 6.5 (ref 5.0–8.0)

## 2020-08-28 LAB — T4, FREE: Free T4: 0.91 ng/dL (ref 0.60–1.60)

## 2020-08-28 LAB — LIPID PANEL
Cholesterol: 257 mg/dL — ABNORMAL HIGH (ref 0–200)
HDL: 74.6 mg/dL (ref >39.00)
LDL Cholesterol: 167 mg/dL — ABNORMAL HIGH (ref 0–99)
NonHDL: 182.47
Total CHOL/HDL Ratio: 3
Triglycerides: 79 mg/dL (ref 0.0–149.0)
VLDL: 15.8 mg/dL (ref 0.0–40.0)

## 2020-09-02 ENCOUNTER — Ambulatory Visit: Payer: Medicare Other | Admitting: Internal Medicine

## 2020-09-15 DIAGNOSIS — H40013 Open angle with borderline findings, low risk, bilateral: Secondary | ICD-10-CM | POA: Diagnosis not present

## 2020-09-15 DIAGNOSIS — Z961 Presence of intraocular lens: Secondary | ICD-10-CM | POA: Diagnosis not present

## 2020-09-15 DIAGNOSIS — H35363 Drusen (degenerative) of macula, bilateral: Secondary | ICD-10-CM | POA: Diagnosis not present

## 2020-09-15 DIAGNOSIS — H35033 Hypertensive retinopathy, bilateral: Secondary | ICD-10-CM | POA: Diagnosis not present

## 2020-09-17 ENCOUNTER — Other Ambulatory Visit: Payer: Self-pay

## 2020-09-17 ENCOUNTER — Ambulatory Visit (INDEPENDENT_AMBULATORY_CARE_PROVIDER_SITE_OTHER): Payer: Medicare Other | Admitting: Internal Medicine

## 2020-09-17 ENCOUNTER — Encounter: Payer: Self-pay | Admitting: Internal Medicine

## 2020-09-17 VITALS — BP 148/82 | HR 69 | Temp 98.0°F | Ht 65.5 in | Wt 164.0 lb

## 2020-09-17 DIAGNOSIS — E034 Atrophy of thyroid (acquired): Secondary | ICD-10-CM

## 2020-09-17 DIAGNOSIS — K51818 Other ulcerative colitis with other complication: Secondary | ICD-10-CM | POA: Diagnosis not present

## 2020-09-17 DIAGNOSIS — E538 Deficiency of other specified B group vitamins: Secondary | ICD-10-CM

## 2020-09-17 DIAGNOSIS — Z23 Encounter for immunization: Secondary | ICD-10-CM | POA: Diagnosis not present

## 2020-09-17 DIAGNOSIS — R739 Hyperglycemia, unspecified: Secondary | ICD-10-CM

## 2020-09-17 DIAGNOSIS — I10 Essential (primary) hypertension: Secondary | ICD-10-CM

## 2020-09-17 DIAGNOSIS — E785 Hyperlipidemia, unspecified: Secondary | ICD-10-CM

## 2020-09-17 LAB — COMPREHENSIVE METABOLIC PANEL
ALT: 16 U/L (ref 0–35)
AST: 23 U/L (ref 0–37)
Albumin: 4.1 g/dL (ref 3.5–5.2)
Alkaline Phosphatase: 68 U/L (ref 39–117)
BUN: 18 mg/dL (ref 6–23)
CO2: 28 mEq/L (ref 19–32)
Calcium: 10.1 mg/dL (ref 8.4–10.5)
Chloride: 101 mEq/L (ref 96–112)
Creatinine, Ser: 0.94 mg/dL (ref 0.40–1.20)
GFR: 55.8 mL/min — ABNORMAL LOW (ref >60.00)
Glucose, Bld: 92 mg/dL (ref 70–99)
Potassium: 4.4 mEq/L (ref 3.5–5.1)
Sodium: 134 mEq/L — ABNORMAL LOW (ref 135–145)
Total Bilirubin: 0.5 mg/dL (ref 0.2–1.2)
Total Protein: 6.8 g/dL (ref 6.0–8.3)

## 2020-09-17 LAB — LIPID PANEL
Cholesterol: 191 mg/dL (ref 0–200)
HDL: 69.1 mg/dL (ref >39.00)
LDL Cholesterol: 93 mg/dL (ref 0–99)
NonHDL: 121.46
Total CHOL/HDL Ratio: 3
Triglycerides: 141 mg/dL (ref 0.0–149.0)
VLDL: 28.2 mg/dL (ref 0.0–40.0)

## 2020-09-17 LAB — URINALYSIS, ROUTINE W REFLEX MICROSCOPIC
Bilirubin Urine: NEGATIVE
Hgb urine dipstick: NEGATIVE
Ketones, ur: NEGATIVE
Nitrite: NEGATIVE
RBC / HPF: NONE SEEN (ref 0–?)
Specific Gravity, Urine: 1.015 (ref 1.000–1.030)
Total Protein, Urine: NEGATIVE
Urine Glucose: NEGATIVE
Urobilinogen, UA: 0.2 (ref 0.0–1.0)
pH: 6.5 (ref 5.0–8.0)

## 2020-09-17 LAB — VITAMIN B12: Vitamin B-12: 595 pg/mL (ref 211–911)

## 2020-09-17 LAB — HEMOGLOBIN A1C: Hgb A1c MFr Bld: 6.2 % (ref 4.6–6.5)

## 2020-09-17 LAB — TSH: TSH: 0.72 u[IU]/mL (ref 0.35–4.50)

## 2020-09-17 NOTE — Assessment & Plan Note (Signed)
Metoprolol, Verapamil, Aceon BP is ok at home - always nl

## 2020-09-17 NOTE — Progress Notes (Signed)
Subjective:  Patient ID: Jasmine Mccormick, female    DOB: March 02, 1934  Age: 87 y.o. MRN: [id number]  CC: Follow-up (6 month f/u)   HPI ANISSA ABBS presents for 6 months f/u on HTN, colitis, hypothyroidism BP - nl at home  Outpatient Medications Prior to Visit  Medication Sig Dispense Refill  . Cholecalciferol 1000 units tablet Take 1 tablet (1,000 Units total) by mouth daily. 100 tablet 3  . Cyanocobalamin (VITAMIN B-12) 500 MCG SUBL 1 sl qd 150 tablet 11  . diclofenac Sodium (VOLTAREN) 1 % GEL Apply 1 application topically 4 (four) times daily. 100 g 3  . metoprolol succinate (TOPROL-XL) 25 MG 24 hr tablet TAKE 1 TABLET BY MOUTH DAILY AT NIGHT 90 tablet 3  . Multiple Vitamins-Minerals (MULTIVITAMIN,TX-MINERALS) tablet Take 1 tablet by mouth daily.    . perindopril (ACEON) 8 MG tablet Take 1 tablet (8 mg total) by mouth daily. 90 tablet 3  . simvastatin (ZOCOR) 20 MG tablet Take 1 tablet (20 mg total) by mouth at bedtime. 90 tablet 3  . SYNTHROID 50 MCG tablet Take 75 mcg a day Mon-Fri and 50 mcg a day on Sat and Sun 90 tablet 3  . verapamil (CALAN) 80 MG tablet Take 1 tablet (80 mg total) by mouth 2 (two) times daily. 180 tablet 3  . gentamicin cream (GARAMYCIN) 0.1 % Apply 1 application topically 2 (two) times daily. (Patient not taking: Reported on 09/17/2020) 15 g 0  . triamcinolone cream (KENALOG) 0.5 % Apply topically 2 (two) times daily. (Patient not taking: Reported on 09/17/2020) 30 g 2   No facility-administered medications prior to visit.    ROS: Review of Systems  Constitutional: Negative for activity change, appetite change, chills, fatigue and unexpected weight change.  HENT: Negative for congestion, mouth sores and sinus pressure.   Eyes: Negative for visual disturbance.  Respiratory: Negative for cough and chest tightness.   Gastrointestinal: Negative for abdominal pain and nausea.  Genitourinary: Negative for difficulty urinating, frequency and vaginal pain.   Musculoskeletal: Positive for arthralgias. Negative for back pain and gait problem.  Skin: Negative for pallor and rash.  Neurological: Negative for dizziness, tremors, weakness, numbness and headaches.  Psychiatric/Behavioral: Negative for confusion and sleep disturbance.    Objective:  BP (!) 148/82 (BP Location: Left Arm)   Pulse 69   Temp 98 F (36.7 C) (Oral)   Ht 5' 5.5" (1.664 m)   Wt 164 lb (74.4 kg)   SpO2 95%   BMI 26.88 kg/m   BP Readings from Last 3 Encounters:  09/17/20 (!) 148/82  04/02/20 (!) 162/66  02/26/20 (!) 178/86    Wt Readings from Last 3 Encounters:  09/17/20 164 lb (74.4 kg)  04/02/20 160 lb 2 oz (72.6 kg)  02/26/20 158 lb (71.7 kg)    Physical Exam Constitutional:      General: She is not in acute distress.    Appearance: She is well-developed. She is obese.  HENT:     Head: Normocephalic.     Right Ear: External ear normal.     Left Ear: External ear normal.     Nose: Nose normal.     Mouth/Throat:     Mouth: Oropharynx is clear and moist.  Eyes:     General:        Right eye: No discharge.        Left eye: No discharge.     Conjunctiva/sclera: Conjunctivae normal.     Pupils: Pupils are  equal, round, and reactive to light.  Neck:     Thyroid: No thyromegaly.     Vascular: No JVD.     Trachea: No tracheal deviation.  Cardiovascular:     Rate and Rhythm: Normal rate and regular rhythm.     Heart sounds: Normal heart sounds.  Pulmonary:     Effort: No respiratory distress.     Breath sounds: No stridor. No wheezing.  Abdominal:     General: Bowel sounds are normal. There is no distension.     Palpations: Abdomen is soft. There is no mass.     Tenderness: There is no abdominal tenderness. There is no guarding or rebound.  Musculoskeletal:        General: No tenderness or edema.     Cervical back: Normal range of motion and neck supple.  Lymphadenopathy:     Cervical: No cervical adenopathy.  Skin:    Findings: No erythema or  rash.  Neurological:     Mental Status: She is oriented to person, place, and time.     Cranial Nerves: No cranial nerve deficit.     Motor: No abnormal muscle tone.     Coordination: Coordination normal.     Deep Tendon Reflexes: Reflexes normal.  Psychiatric:        Mood and Affect: Mood and affect normal.        Behavior: Behavior normal.        Thought Content: Thought content normal.        Judgment: Judgment normal.   stoma bag  Lab Results  Component Value Date   WBC 5.9 08/28/2020   HGB 14.9 08/28/2020   HCT 43.9 08/28/2020   PLT 263.0 08/28/2020   GLUCOSE 107 (H) 08/28/2020   CHOL 257 (H) 08/28/2020   TRIG 79.0 08/28/2020   HDL 74.60 08/28/2020   LDLCALC 167 (H) 08/28/2020   ALT 15 08/28/2020   AST 22 08/28/2020   NA 136 08/28/2020   K 4.2 08/28/2020   CL 102 08/28/2020   CREATININE 0.84 08/28/2020   BUN 18 08/28/2020   CO2 24 08/28/2020   TSH 0.97 08/28/2020   HGBA1C 6.2 08/13/2013    DG Forearm Left  Result Date: 06/25/2018 CLINICAL DATA:  Fall 2 days ago.  Generalized arm pain. EXAM: LEFT FOREARM - 2 VIEW COMPARISON:  Wrist series performed today FINDINGS: There is a fracture in the distal left radius, best seen posteriorly on the lateral view. No visible ulnar abnormality. Soft tissues are intact. IMPRESSION: Nondisplaced distal left radial fracture. Electronically Signed   By: Rolm Baptise M.D.   On: 06/25/2018 10:27   DG Wrist Complete Left  Result Date: 06/25/2018 CLINICAL DATA:  Fall, generalized wrist pain EXAM: LEFT WRIST - COMPLETE 3+ VIEW COMPARISON:  None. FINDINGS: There is a nondisplaced fracture noted posteriorly on the lateral view felt represent at posterior distal left radial fracture. No visible ulnar fracture. No subluxation or dislocation. Posterior soft tissue swelling. IMPRESSION: Nondisplaced distal left radial fracture best seen posteriorly on the lateral view. Electronically Signed   By: Rolm Baptise M.D.   On: 06/25/2018 10:26   DG  Hand Complete Left  Result Date: 06/25/2018 CLINICAL DATA:  Fall, left hand pain, bruising EXAM: LEFT HAND - COMPLETE 3+ VIEW COMPARISON:  None. FINDINGS: There is a nondisplaced fracture noted posteriorly on the lateral view felt represent at posterior distal left radial fracture. No visible ulnar fracture or additional fracture within the left hand. No subluxation or dislocation.  Posterior soft tissue swelling. Early osteoarthritis in the IP joints. IMPRESSION: Distal left radial fracture, best seen posteriorly on the lateral view. Electronically Signed   By: Rolm Baptise M.D.   On: 06/25/2018 10:27    Assessment & Plan:    Walker Kehr, MD

## 2020-09-17 NOTE — Assessment & Plan Note (Signed)
Ileostomy bag supplies

## 2020-09-17 NOTE — Assessment & Plan Note (Signed)
On Levothroid 

## 2020-09-17 NOTE — Assessment & Plan Note (Signed)
On Simvastatin

## 2020-09-17 NOTE — Assessment & Plan Note (Signed)
B12 vitamin

## 2020-10-30 ENCOUNTER — Ambulatory Visit (INDEPENDENT_AMBULATORY_CARE_PROVIDER_SITE_OTHER): Payer: Medicare Other | Admitting: Podiatry

## 2020-10-30 ENCOUNTER — Encounter: Payer: Self-pay | Admitting: Podiatry

## 2020-10-30 ENCOUNTER — Other Ambulatory Visit: Payer: Self-pay

## 2020-10-30 DIAGNOSIS — M79674 Pain in right toe(s): Secondary | ICD-10-CM | POA: Diagnosis not present

## 2020-10-30 DIAGNOSIS — M79675 Pain in left toe(s): Secondary | ICD-10-CM

## 2020-10-30 DIAGNOSIS — B351 Tinea unguium: Secondary | ICD-10-CM

## 2020-11-04 ENCOUNTER — Encounter: Payer: Self-pay | Admitting: Internal Medicine

## 2020-11-04 ENCOUNTER — Other Ambulatory Visit: Payer: Self-pay

## 2020-11-04 ENCOUNTER — Ambulatory Visit (INDEPENDENT_AMBULATORY_CARE_PROVIDER_SITE_OTHER): Payer: Medicare Other | Admitting: Internal Medicine

## 2020-11-04 VITALS — BP 134/80 | HR 64 | Temp 97.7°F | Resp 18 | Ht 65.5 in | Wt 163.2 lb

## 2020-11-04 DIAGNOSIS — M545 Low back pain, unspecified: Secondary | ICD-10-CM | POA: Diagnosis not present

## 2020-11-04 MED ORDER — MELOXICAM 15 MG PO TABS: 15.0000 mg | ORAL_TABLET | Freq: Every day | ORAL | 0 refills | Status: DC

## 2020-11-04 MED ORDER — KETOROLAC TROMETHAMINE 30 MG/ML IJ SOLN
30.0000 mg | Freq: Once | INTRAMUSCULAR | Status: AC
  Administered 2020-11-04: 30 mg via INTRAMUSCULAR

## 2020-11-04 MED ORDER — METHYLPREDNISOLONE ACETATE 40 MG/ML IJ SUSP
40.0000 mg | Freq: Once | INTRAMUSCULAR | Status: AC
  Administered 2020-11-04: 40 mg via INTRAMUSCULAR

## 2020-11-04 NOTE — Progress Notes (Unsigned)
   Subjective:   Patient ID: Jasmine Mccormick, female    DOB: 11/10/1933, 87 y.o.   MRN: [id number]  HPI The patient is an 87 YO female coming in for pain in her back. Started about 2-3 days ago while helping her husband. He does need assistance with ambulation. She denies injury but this does strain her to do. Denies taking anything for this. Overall pain stable. Denies radiation of the pain. Denies numbness or weakness. Denies change in bowels or bladder.   Review of Systems  Constitutional: Positive for activity change. Negative for appetite change, chills, fatigue, fever and unexpected weight change.  Respiratory: Negative.   Cardiovascular: Negative.   Gastrointestinal: Negative.   Musculoskeletal: Positive for arthralgias, back pain and myalgias. Negative for gait problem and joint swelling.  Skin: Negative.   Neurological: Negative.     Objective:  Physical Exam Constitutional:      Appearance: She is well-developed.  HENT:     Head: Normocephalic and atraumatic.  Cardiovascular:     Rate and Rhythm: Normal rate and regular rhythm.  Pulmonary:     Effort: Pulmonary effort is normal. No respiratory distress.     Breath sounds: Normal breath sounds. No wheezing or rales.  Abdominal:     General: Bowel sounds are normal. There is no distension.     Palpations: Abdomen is soft.     Tenderness: There is no abdominal tenderness. There is no rebound.  Musculoskeletal:        General: Tenderness present.     Cervical back: Normal range of motion.     Comments: Tenderness lumbar paraspinally bilateral  Skin:    General: Skin is warm and dry.  Neurological:     Mental Status: She is alert and oriented to person, place, and time.     Coordination: Coordination normal.     Vitals:   11/04/20 1043  BP: 134/80  Pulse: 64  Resp: 18  Temp: 97.7 F (36.5 C)  TempSrc: Oral  SpO2: 98%  Weight: 163 lb 3.2 oz (74 kg)  Height: 5' 5.5" (1.664 m)    This visit occurred  during the SARS-CoV-2 public health emergency.  Safety protocols were in place, including screening questions prior to the visit, additional usage of staff PPE, and extensive cleaning of exam room while observing appropriate contact time as indicated for disinfecting solutions.   Assessment & Plan:  Depo-medrol 40 mg IM and toradol 30 mg IM given at visit

## 2020-11-04 NOTE — Patient Instructions (Signed)
Meloxicam we have sent in for the pain to take 1 pill daily. Do not take aleve with this.   If it okay to take tylenol with this. The maximum dose of tylenol is 3000 mg daily (6 of the 500 mg tablets).

## 2020-11-06 DIAGNOSIS — M545 Low back pain, unspecified: Secondary | ICD-10-CM | POA: Insufficient documentation

## 2020-11-06 NOTE — Progress Notes (Signed)
Subjective: Jasmine Mccormick is a 87 y.o. female patient seen today painful mycotic nails b/l that are difficult to trim. Pain interferes with ambulation. Aggravating factors include wearing enclosed shoe gear. Pain is relieved with periodic professional debridement.  Patient voices no new pedal problems on today's visit.  No Known Allergies  Objective: Physical Exam  General: Jasmine Mccormick is a pleasant 87 y.o. Caucasian female, WD, WN in NAD. AAO x 3.   Vascular:  Neurovascular status unchanged b/l. Capillary refill time to digits immediate b/l. Palpable DP pulses b/l. Palpable PT pulses b/l. Pedal hair present b/l. Skin temperature gradient within normal limits b/l.  Dermatological:  Pedal skin with normal turgor, texture and tone bilaterally. No open wounds bilaterally. No interdigital macerations bilaterally. Toenails 1-5 b/l elongated, discolored, dystrophic, thickened, crumbly with subungual debris and tenderness to dorsal palpation.  Musculoskeletal:  Normal muscle strength 5/5 to all lower extremity muscle groups bilaterally. No gross bony deformities bilaterally. No pain crepitus or joint limitation noted with ROM b/l.  Neurological:  Protective sensation intact 5/5 intact bilaterally with 10g monofilament b/l. Vibratory sensation intact b/l. Proprioception intact bilaterally.  Assessment and Plan:  1. Pain due to onychomycosis of toenails of both feet     -Examined patient. -No new findings. No new orders. -Toenails 1-5 b/l were debrided in length and girth with sterile nail nippers and dremel without iatrogenic bleeding.  -Patient to continue soft, supportive shoe gear daily. -Patient to report any pedal injuries to medical professional immediately. -Patient/POA to call should there be question/concern in the interim.  Return in about 3 months (around 01/30/2021).  Marzetta Board, DPM

## 2020-11-06 NOTE — Assessment & Plan Note (Signed)
Given toradol 30 mg IM and depo-medrol 40 mg IM at visit. Rx meloxicam to take for 1-2 weeks. Likely muscular strain from overexertion.

## 2020-11-09 ENCOUNTER — Telehealth: Payer: Self-pay | Admitting: Internal Medicine

## 2020-11-09 MED ORDER — METHOCARBAMOL 500 MG PO TABS: 500.0000 mg | ORAL_TABLET | Freq: Three times a day (TID) | ORAL | 0 refills | Status: DC | PRN

## 2020-11-09 NOTE — Telephone Encounter (Signed)
Is the meloxicam helping at all? Can also take tylenol with this.

## 2020-11-09 NOTE — Telephone Encounter (Signed)
Patients niece calling, they brought her in on 03.23.22 and saw Dr. Sharlet Salina. Since then her pain in her back is just getting worse and they are wondering what they need to do to help her.

## 2020-11-09 NOTE — Telephone Encounter (Signed)
Patient has been taking meloxicam and tylenol with no relief. It hurts for the patient to walk. The only time she feel relieve is when she is laying flat on the couch. Please advise

## 2020-11-09 NOTE — Telephone Encounter (Signed)
Sent in methocarbamol to use for pain up to 3 times a day. This is likely a pulled muscle and can take several weeks to heal. For any new symptoms such as weakness or numbness in the legs recommend visit for follow up.

## 2020-11-09 NOTE — Addendum Note (Signed)
Addended by: Pricilla Holm A on: 11/09/2020 02:49 PM   Modules accepted: Orders

## 2020-11-09 NOTE — Telephone Encounter (Signed)
See below

## 2020-11-10 NOTE — Telephone Encounter (Signed)
Patient's family has been made aware.

## 2020-11-12 ENCOUNTER — Ambulatory Visit (INDEPENDENT_AMBULATORY_CARE_PROVIDER_SITE_OTHER): Payer: Medicare Other | Admitting: Internal Medicine

## 2020-11-12 ENCOUNTER — Ambulatory Visit (INDEPENDENT_AMBULATORY_CARE_PROVIDER_SITE_OTHER): Payer: Medicare Other

## 2020-11-12 ENCOUNTER — Other Ambulatory Visit: Payer: Self-pay

## 2020-11-12 ENCOUNTER — Encounter: Payer: Self-pay | Admitting: Internal Medicine

## 2020-11-12 VITALS — BP 148/76 | HR 69 | Temp 98.0°F | Ht 65.5 in | Wt 158.0 lb

## 2020-11-12 DIAGNOSIS — M545 Low back pain, unspecified: Secondary | ICD-10-CM | POA: Diagnosis not present

## 2020-11-12 DIAGNOSIS — M5442 Lumbago with sciatica, left side: Secondary | ICD-10-CM

## 2020-11-12 MED ORDER — LIDOCAINE 5 % EX PTCH: 1.0000 | MEDICATED_PATCH | CUTANEOUS | 0 refills | Status: DC

## 2020-11-12 NOTE — Progress Notes (Signed)
   Subjective:   Patient ID: Jasmine Mccormick, female    DOB: 1934-01-14, 87 y.o.   MRN: [id number]  HPI The patient is an 87 YO female coming in for back pain. Started about 11-12 days ago with pulling up husband whom she cares for. She did re-injure her back about 2-3 days ago with picking him up from several falls. She is now having some heaviness in her left leg and a feeling of numbness. Pain is mostly in the back with some radiation down the leg. Tried meloxicam without relief. Tried methocarbamol with mild relief but this makes her drowsy. She is using only at night time now. Pain is worsening and 8/10.  Review of Systems  Constitutional: Positive for activity change. Negative for appetite change, chills, fatigue, fever and unexpected weight change.  Respiratory: Negative.   Cardiovascular: Negative.   Gastrointestinal: Negative.   Musculoskeletal: Positive for arthralgias, back pain and myalgias. Negative for gait problem and joint swelling.  Skin: Negative.   Neurological: Negative.     Objective:  Physical Exam Constitutional:      Appearance: She is well-developed.  HENT:     Head: Normocephalic and atraumatic.  Cardiovascular:     Rate and Rhythm: Normal rate and regular rhythm.  Pulmonary:     Effort: Pulmonary effort is normal. No respiratory distress.     Breath sounds: Normal breath sounds. No wheezing or rales.  Abdominal:     General: Bowel sounds are normal. There is no distension.     Palpations: Abdomen is soft.     Tenderness: There is no abdominal tenderness. There is no rebound.  Musculoskeletal:        General: Tenderness present.     Cervical back: Normal range of motion.  Skin:    General: Skin is warm and dry.  Neurological:     Mental Status: She is alert and oriented to person, place, and time.     Coordination: Coordination normal.     Vitals:   11/12/20 1449  BP: (!) 148/76  Pulse: 69  Temp: 98 F (36.7 C)  TempSrc: Oral  SpO2: 94%   Weight: 158 lb (71.7 kg)  Height: 5' 5.5" (1.664 m)    This visit occurred during the SARS-CoV-2 public health emergency.  Safety protocols were in place, including screening questions prior to the visit, additional usage of staff PPE, and extensive cleaning of exam room while observing appropriate contact time as indicated for disinfecting solutions.   Assessment & Plan:  Visit time 20 minutes in face to face communication with patient and coordination of care, additional 10 minutes spent in record review, coordination or care, ordering tests, communicating/referring to other healthcare professionals, documenting in medical records all on the same day of the visit for total time 30 minutes spent on the visit.

## 2020-11-12 NOTE — Assessment & Plan Note (Signed)
Rx lidoderm patches, checking x-ray lumbar. Change management if needed. Most likely is still sprain/strain of the low back which can take 4-6 weeks for full healing without ongoing injury which she does have.

## 2020-11-12 NOTE — Patient Instructions (Signed)
We have sent in the lidocaine patches.  The muscle relaxer it is okay to take at night time if it's helping.  We are checking the x-ray today.

## 2020-11-13 ENCOUNTER — Encounter: Payer: Self-pay | Admitting: Internal Medicine

## 2020-11-13 ENCOUNTER — Other Ambulatory Visit: Payer: Self-pay | Admitting: Internal Medicine

## 2020-11-13 DIAGNOSIS — S32030A Wedge compression fracture of third lumbar vertebra, initial encounter for closed fracture: Secondary | ICD-10-CM

## 2020-11-15 ENCOUNTER — Ambulatory Visit
Admission: RE | Admit: 2020-11-15 | Discharge: 2020-11-15 | Disposition: A | Discharge status: Home / Self Care | Payer: Medicare Other | Source: Ambulatory Visit | Attending: Internal Medicine | Admitting: Internal Medicine

## 2020-11-15 ENCOUNTER — Other Ambulatory Visit: Payer: Self-pay

## 2020-11-15 DIAGNOSIS — M48061 Spinal stenosis, lumbar region without neurogenic claudication: Secondary | ICD-10-CM | POA: Diagnosis not present

## 2020-11-15 DIAGNOSIS — M545 Low back pain, unspecified: Secondary | ICD-10-CM | POA: Diagnosis not present

## 2020-11-15 DIAGNOSIS — S32030A Wedge compression fracture of third lumbar vertebra, initial encounter for closed fracture: Secondary | ICD-10-CM

## 2020-11-17 ENCOUNTER — Encounter: Payer: Self-pay | Admitting: Internal Medicine

## 2020-11-19 ENCOUNTER — Other Ambulatory Visit: Payer: Self-pay | Admitting: Internal Medicine

## 2020-11-19 ENCOUNTER — Encounter: Payer: Self-pay | Admitting: Internal Medicine

## 2020-11-19 DIAGNOSIS — S32030A Wedge compression fracture of third lumbar vertebra, initial encounter for closed fracture: Secondary | ICD-10-CM

## 2020-11-23 ENCOUNTER — Encounter: Payer: Self-pay | Admitting: Internal Medicine

## 2020-11-24 DIAGNOSIS — R030 Elevated blood-pressure reading, without diagnosis of hypertension: Secondary | ICD-10-CM | POA: Insufficient documentation

## 2020-11-24 DIAGNOSIS — S32030A Wedge compression fracture of third lumbar vertebra, initial encounter for closed fracture: Secondary | ICD-10-CM | POA: Insufficient documentation

## 2020-11-24 DIAGNOSIS — Z6826 Body mass index (BMI) 26.0-26.9, adult: Secondary | ICD-10-CM | POA: Insufficient documentation

## 2020-11-26 ENCOUNTER — Other Ambulatory Visit: Payer: Self-pay | Admitting: Internal Medicine

## 2020-11-30 NOTE — Telephone Encounter (Signed)
Meloxicam was not effective so should not be refilled. Lidoderm should not be due for refill as 1 month supply was done.

## 2020-12-08 ENCOUNTER — Encounter: Payer: Self-pay | Admitting: Internal Medicine

## 2020-12-08 ENCOUNTER — Other Ambulatory Visit: Payer: Self-pay

## 2020-12-08 DIAGNOSIS — S32030A Wedge compression fracture of third lumbar vertebra, initial encounter for closed fracture: Secondary | ICD-10-CM | POA: Diagnosis not present

## 2020-12-08 MED ORDER — LIDOCAINE 5 % EX PTCH: 1.0000 | MEDICATED_PATCH | CUTANEOUS | 0 refills | Status: DC

## 2020-12-10 ENCOUNTER — Other Ambulatory Visit: Payer: Self-pay | Admitting: *Deleted

## 2020-12-10 MED ORDER — SYNTHROID 50 MCG PO TABS
ORAL_TABLET | ORAL | 3 refills | Status: DC
Start: 2020-12-10 — End: 2021-03-29

## 2020-12-18 DIAGNOSIS — U071 COVID-19: Secondary | ICD-10-CM

## 2020-12-18 HISTORY — DX: COVID-19: U07.1

## 2020-12-22 ENCOUNTER — Telehealth (INDEPENDENT_AMBULATORY_CARE_PROVIDER_SITE_OTHER): Payer: Medicare Other | Admitting: Internal Medicine

## 2020-12-22 ENCOUNTER — Encounter: Payer: Self-pay | Admitting: Internal Medicine

## 2020-12-22 ENCOUNTER — Other Ambulatory Visit: Payer: Self-pay | Admitting: Internal Medicine

## 2020-12-22 ENCOUNTER — Other Ambulatory Visit: Payer: Self-pay

## 2020-12-22 DIAGNOSIS — U071 COVID-19: Secondary | ICD-10-CM

## 2020-12-22 MED ORDER — PAXLOVID 20 X 150 MG & 10 X 100MG PO TBPK: 3.0000 | ORAL_TABLET | Freq: Two times a day (BID) | ORAL | 0 refills | Status: DC

## 2020-12-22 NOTE — Progress Notes (Signed)
Virtual Visit via Video Note  I connected with [name] on 12/22/20 at 11:20 AM EDT by a video enabled telemedicine application and verified that I am speaking with the correct person using two identifiers.   I discussed the limitations of evaluation and management by telemedicine and the availability of in person appointments. The patient expressed understanding and agreed to proceed.  I was located at our [facility] office. The patient was at home. There was Hank (her nephew) present in the visit.   History of Present Illness:  C/o mild congestion, phlegm sensation in the throat. Feeling ok. There has been no runny nose, chest pain, shortness of breath, abdominal pain, diarrhea, constipation,  skin rashes. Got ill on Sat-Sun. Test for COVID was (+)   Observations/Objective: The patient appears to be in no acute distress, looks ok  Assessment and Plan:  See my Assessment and Plan. Follow Up Instructions:    I discussed the assessment and treatment plan with the patient. The patient was provided an opportunity to ask questions and all were answered. The patient agreed with the plan and demonstrated an understanding of the instructions.   The patient was advised to call back or seek an in-person evaluation if the symptoms worsen or if the condition fails to improve as anticipated.  I provided face-to-face time during this encounter. We were at different locations.   Walker Kehr, MD

## 2020-12-22 NOTE — Assessment & Plan Note (Signed)
Mild Start Paxlovid if feeling worse tonight OTC cold meds

## 2020-12-23 ENCOUNTER — Telehealth: Payer: Medicare Other | Admitting: Internal Medicine

## 2020-12-31 DIAGNOSIS — Z6826 Body mass index (BMI) 26.0-26.9, adult: Secondary | ICD-10-CM | POA: Diagnosis not present

## 2020-12-31 DIAGNOSIS — I10 Essential (primary) hypertension: Secondary | ICD-10-CM | POA: Diagnosis not present

## 2020-12-31 DIAGNOSIS — S32030A Wedge compression fracture of third lumbar vertebra, initial encounter for closed fracture: Secondary | ICD-10-CM | POA: Diagnosis not present

## 2021-01-05 ENCOUNTER — Other Ambulatory Visit: Payer: Self-pay | Admitting: Neurological Surgery

## 2021-01-06 ENCOUNTER — Other Ambulatory Visit: Payer: Self-pay

## 2021-01-06 ENCOUNTER — Other Ambulatory Visit (HOSPITAL_COMMUNITY): Payer: Medicare Other

## 2021-01-06 ENCOUNTER — Encounter (HOSPITAL_COMMUNITY): Payer: Self-pay | Admitting: Neurological Surgery

## 2021-01-06 NOTE — Progress Notes (Signed)
PCP - Dr Alain Marion Cardiologist - n/a  Chest x-ray - n/a EKG - DOS 01/07/21 Stress Test - n/a ECHO - n/a Cardiac Cath - n/a  STOP now taking any Aspirin (unless otherwise instructed by your surgeon), Aleve, Naproxen, Ibuprofen, Motrin, Advil, Goody's, BC's, all herbal medications, fish oil, and all vitamins.   Coronavirus Screening Do you have any of the following symptoms:  Cough yes/no: No Fever (>100.64F)  yes/no: No Runny nose yes/no: No Sore throat yes/no: No Difficulty breathing/shortness of breath  yes/no: No  Have you traveled in the last 14 days and where? yes/no: No  Niece Olegario Messier verbalized understanding of instructions that were given via phone.

## 2021-01-07 ENCOUNTER — Ambulatory Visit (HOSPITAL_COMMUNITY): Payer: Medicare Other

## 2021-01-07 ENCOUNTER — Ambulatory Visit (HOSPITAL_COMMUNITY): Payer: Medicare Other | Admitting: Anesthesiology

## 2021-01-07 ENCOUNTER — Encounter (HOSPITAL_COMMUNITY)
Admission: RE | Disposition: A | Discharge status: Home / Self Care | Payer: Self-pay | Source: Home / Self Care | Attending: Neurological Surgery

## 2021-01-07 ENCOUNTER — Ambulatory Visit (HOSPITAL_COMMUNITY)
Admission: RE | Admit: 2021-01-07 | Discharge: 2021-01-07 | Disposition: A | Discharge status: Home / Self Care | Payer: Medicare Other | Attending: Neurological Surgery | Admitting: Neurological Surgery

## 2021-01-07 ENCOUNTER — Encounter (HOSPITAL_COMMUNITY): Payer: Self-pay | Admitting: Neurological Surgery

## 2021-01-07 DIAGNOSIS — Z8616 Personal history of COVID-19: Secondary | ICD-10-CM | POA: Diagnosis not present

## 2021-01-07 DIAGNOSIS — M8088XS Other osteoporosis with current pathological fracture, vertebra(e), sequela: Secondary | ICD-10-CM | POA: Insufficient documentation

## 2021-01-07 DIAGNOSIS — H8109 Meniere's disease, unspecified ear: Secondary | ICD-10-CM | POA: Diagnosis not present

## 2021-01-07 DIAGNOSIS — E785 Hyperlipidemia, unspecified: Secondary | ICD-10-CM | POA: Diagnosis not present

## 2021-01-07 DIAGNOSIS — E039 Hypothyroidism, unspecified: Secondary | ICD-10-CM | POA: Diagnosis not present

## 2021-01-07 DIAGNOSIS — M8008XA Age-related osteoporosis with current pathological fracture, vertebra(e), initial encounter for fracture: Secondary | ICD-10-CM | POA: Diagnosis not present

## 2021-01-07 DIAGNOSIS — Z932 Ileostomy status: Secondary | ICD-10-CM | POA: Insufficient documentation

## 2021-01-07 DIAGNOSIS — Z8249 Family history of ischemic heart disease and other diseases of the circulatory system: Secondary | ICD-10-CM | POA: Insufficient documentation

## 2021-01-07 DIAGNOSIS — Z9049 Acquired absence of other specified parts of digestive tract: Secondary | ICD-10-CM | POA: Insufficient documentation

## 2021-01-07 DIAGNOSIS — Z419 Encounter for procedure for purposes other than remedying health state, unspecified: Secondary | ICD-10-CM

## 2021-01-07 DIAGNOSIS — S32010A Wedge compression fracture of first lumbar vertebra, initial encounter for closed fracture: Secondary | ICD-10-CM | POA: Diagnosis not present

## 2021-01-07 DIAGNOSIS — I10 Essential (primary) hypertension: Secondary | ICD-10-CM | POA: Insufficient documentation

## 2021-01-07 DIAGNOSIS — Z79890 Hormone replacement therapy (postmenopausal): Secondary | ICD-10-CM | POA: Insufficient documentation

## 2021-01-07 DIAGNOSIS — Z79899 Other long term (current) drug therapy: Secondary | ICD-10-CM | POA: Insufficient documentation

## 2021-01-07 DIAGNOSIS — M8088XA Other osteoporosis with current pathological fracture, vertebra(e), initial encounter for fracture: Secondary | ICD-10-CM | POA: Diagnosis not present

## 2021-01-07 DIAGNOSIS — X58XXXS Exposure to other specified factors, sequela: Secondary | ICD-10-CM | POA: Diagnosis not present

## 2021-01-07 HISTORY — DX: Hypothyroidism, unspecified: E03.9

## 2021-01-07 HISTORY — DX: Other amnesia: R41.3

## 2021-01-07 HISTORY — PX: KYPHOPLASTY: SHX5884

## 2021-01-07 LAB — BASIC METABOLIC PANEL
Anion gap: 9 (ref 5–15)
BUN: 16 mg/dL (ref 8–23)
CO2: 23 mmol/L (ref 22–32)
Calcium: 9.3 mg/dL (ref 8.9–10.3)
Chloride: 99 mmol/L (ref 98–111)
Creatinine, Ser: 0.74 mg/dL (ref 0.44–1.00)
GFR, Estimated: >60 mL/min (ref >60)
Glucose, Bld: 108 mg/dL — ABNORMAL HIGH (ref 70–99)
Potassium: 4.1 mmol/L (ref 3.5–5.1)
Sodium: 131 mmol/L — ABNORMAL LOW (ref 135–145)

## 2021-01-07 LAB — CBC
HCT: 42.3 % (ref 36.0–46.0)
Hemoglobin: 14.1 g/dL (ref 12.0–15.0)
MCH: 31.5 pg (ref 26.0–34.0)
MCHC: 33.3 g/dL (ref 30.0–36.0)
MCV: 94.6 fL (ref 80.0–100.0)
Platelets: 228 10*3/uL (ref 150–400)
RBC: 4.47 MIL/uL (ref 3.87–5.11)
RDW: 13.2 % (ref 11.5–15.5)
WBC: 7.3 10*3/uL (ref 4.0–10.5)
nRBC: 0 % (ref 0.0–0.2)

## 2021-01-07 LAB — SARS CORONAVIRUS 2 BY RT PCR (HOSPITAL ORDER, PERFORMED IN ~~LOC~~ HOSPITAL LAB): SARS Coronavirus 2: POSITIVE — AB

## 2021-01-07 SURGERY — KYPHOPLASTY
Anesthesia: General

## 2021-01-07 MED ORDER — FENTANYL CITRATE (PF) 100 MCG/2ML IJ SOLN: 25.0000 ug | INTRAMUSCULAR | Status: DC | PRN

## 2021-01-07 MED ORDER — SUCCINYLCHOLINE CHLORIDE 200 MG/10ML IV SOSY
PREFILLED_SYRINGE | INTRAVENOUS | Status: AC
  Filled 2021-01-07: qty 10

## 2021-01-07 MED ORDER — LIDOCAINE 2% (20 MG/ML) 5 ML SYRINGE
INTRAMUSCULAR | Status: DC | PRN
  Administered 2021-01-07: 60 mg via INTRAVENOUS

## 2021-01-07 MED ORDER — TRAMADOL HCL 50 MG PO TABS: 50.0000 mg | ORAL_TABLET | Freq: Four times a day (QID) | ORAL | 0 refills | Status: DC | PRN

## 2021-01-07 MED ORDER — ROCURONIUM BROMIDE 10 MG/ML (PF) SYRINGE
PREFILLED_SYRINGE | INTRAVENOUS | Status: AC
  Filled 2021-01-07: qty 10

## 2021-01-07 MED ORDER — LACTATED RINGERS IV SOLN: INTRAVENOUS | Status: DC

## 2021-01-07 MED ORDER — CHLORHEXIDINE GLUCONATE 0.12 % MT SOLN
OROMUCOSAL | Status: AC
  Administered 2021-01-07: 15 mL via OROMUCOSAL
  Filled 2021-01-07: qty 15

## 2021-01-07 MED ORDER — METHOCARBAMOL 500 MG PO TABS: 500.0000 mg | ORAL_TABLET | Freq: Three times a day (TID) | ORAL | 0 refills | Status: DC | PRN

## 2021-01-07 MED ORDER — CHLORHEXIDINE GLUCONATE 0.12 % MT SOLN: 15.0000 mL | Freq: Once | OROMUCOSAL | Status: AC

## 2021-01-07 MED ORDER — PHENYLEPHRINE 40 MCG/ML (10ML) SYRINGE FOR IV PUSH (FOR BLOOD PRESSURE SUPPORT)
PREFILLED_SYRINGE | INTRAVENOUS | Status: AC
  Filled 2021-01-07: qty 10

## 2021-01-07 MED ORDER — AMISULPRIDE (ANTIEMETIC) 5 MG/2ML IV SOLN: 10.0000 mg | Freq: Once | INTRAVENOUS | Status: DC | PRN

## 2021-01-07 MED ORDER — LIDOCAINE-EPINEPHRINE 1 %-1:100000 IJ SOLN
INTRAMUSCULAR | Status: AC
  Filled 2021-01-07: qty 1

## 2021-01-07 MED ORDER — BUPIVACAINE HCL (PF) 0.5 % IJ SOLN
INTRAMUSCULAR | Status: AC
  Filled 2021-01-07: qty 30

## 2021-01-07 MED ORDER — PROPOFOL 10 MG/ML IV BOLUS
INTRAVENOUS | Status: DC | PRN
  Administered 2021-01-07: 120 mg via INTRAVENOUS
  Administered 2021-01-07: 30 mg via INTRAVENOUS

## 2021-01-07 MED ORDER — FENTANYL CITRATE (PF) 250 MCG/5ML IJ SOLN
INTRAMUSCULAR | Status: AC
  Filled 2021-01-07: qty 5

## 2021-01-07 MED ORDER — TRAMADOL HCL 50 MG PO TABS
50.0000 mg | ORAL_TABLET | Freq: Once | ORAL | Status: AC
  Administered 2021-01-07: 50 mg via ORAL

## 2021-01-07 MED ORDER — ACETAMINOPHEN 500 MG PO TABS
1000.0000 mg | ORAL_TABLET | Freq: Once | ORAL | Status: AC
Start: 2021-01-07 — End: 2021-01-07
  Administered 2021-01-07: 1000 mg via ORAL

## 2021-01-07 MED ORDER — SUCCINYLCHOLINE CHLORIDE 200 MG/10ML IV SOSY
PREFILLED_SYRINGE | INTRAVENOUS | Status: DC | PRN
  Administered 2021-01-07: 100 mg via INTRAVENOUS

## 2021-01-07 MED ORDER — FENTANYL CITRATE (PF) 100 MCG/2ML IJ SOLN
INTRAMUSCULAR | Status: DC | PRN
  Administered 2021-01-07: 50 ug via INTRAVENOUS

## 2021-01-07 MED ORDER — ONDANSETRON HCL 4 MG/2ML IJ SOLN
INTRAMUSCULAR | Status: AC
  Filled 2021-01-07: qty 2

## 2021-01-07 MED ORDER — CHLORHEXIDINE GLUCONATE CLOTH 2 % EX PADS: 6.0000 | MEDICATED_PAD | Freq: Once | CUTANEOUS | Status: DC

## 2021-01-07 MED ORDER — SUGAMMADEX SODIUM 200 MG/2ML IV SOLN
INTRAVENOUS | Status: DC | PRN
  Administered 2021-01-07: 200 mg via INTRAVENOUS

## 2021-01-07 MED ORDER — PHENYLEPHRINE 40 MCG/ML (10ML) SYRINGE FOR IV PUSH (FOR BLOOD PRESSURE SUPPORT)
PREFILLED_SYRINGE | INTRAVENOUS | Status: DC | PRN
  Administered 2021-01-07 (×4): 80 ug via INTRAVENOUS

## 2021-01-07 MED ORDER — PROPOFOL 10 MG/ML IV BOLUS
INTRAVENOUS | Status: AC
  Filled 2021-01-07: qty 20

## 2021-01-07 MED ORDER — ORAL CARE MOUTH RINSE: 15.0000 mL | Freq: Once | OROMUCOSAL | Status: AC

## 2021-01-07 MED ORDER — LABETALOL HCL 5 MG/ML IV SOLN
INTRAVENOUS | Status: AC
  Administered 2021-01-07: 10 mg via INTRAVENOUS
  Filled 2021-01-07: qty 4

## 2021-01-07 MED ORDER — ROCURONIUM BROMIDE 10 MG/ML (PF) SYRINGE
PREFILLED_SYRINGE | INTRAVENOUS | Status: DC | PRN
  Administered 2021-01-07: 50 mg via INTRAVENOUS

## 2021-01-07 MED ORDER — DEXAMETHASONE SODIUM PHOSPHATE 10 MG/ML IJ SOLN
INTRAMUSCULAR | Status: AC
  Filled 2021-01-07: qty 1

## 2021-01-07 MED ORDER — ONDANSETRON HCL 4 MG/2ML IJ SOLN
INTRAMUSCULAR | Status: DC | PRN
  Administered 2021-01-07: 4 mg via INTRAVENOUS

## 2021-01-07 MED ORDER — EPHEDRINE SULFATE-NACL 50-0.9 MG/10ML-% IV SOSY
PREFILLED_SYRINGE | INTRAVENOUS | Status: DC | PRN
  Administered 2021-01-07 (×5): 10 mg via INTRAVENOUS

## 2021-01-07 MED ORDER — LABETALOL HCL 5 MG/ML IV SOLN: 10.0000 mg | Freq: Once | INTRAVENOUS | Status: AC

## 2021-01-07 MED ORDER — CEFAZOLIN SODIUM-DEXTROSE 2-4 GM/100ML-% IV SOLN
2.0000 g | INTRAVENOUS | Status: AC
  Administered 2021-01-07: 2 g via INTRAVENOUS

## 2021-01-07 MED ORDER — LIDOCAINE 2% (20 MG/ML) 5 ML SYRINGE
INTRAMUSCULAR | Status: AC
  Filled 2021-01-07: qty 5

## 2021-01-07 MED ORDER — CHLORHEXIDINE GLUCONATE CLOTH 2 % EX PADS
6.0000 | MEDICATED_PAD | Freq: Once | CUTANEOUS | Status: AC
  Administered 2021-01-07: 6 via TOPICAL

## 2021-01-07 MED ORDER — 0.9 % SODIUM CHLORIDE (POUR BTL) OPTIME
TOPICAL | Status: DC | PRN
  Administered 2021-01-07: 1000 mL

## 2021-01-07 MED ORDER — BUPIVACAINE HCL (PF) 0.5 % IJ SOLN
INTRAMUSCULAR | Status: DC | PRN
  Administered 2021-01-07: 10 mL

## 2021-01-07 MED ORDER — LIDOCAINE-EPINEPHRINE 1 %-1:100000 IJ SOLN
INTRAMUSCULAR | Status: DC | PRN
  Administered 2021-01-07: 10 mL

## 2021-01-07 MED ORDER — ACETAMINOPHEN 500 MG PO TABS
ORAL_TABLET | ORAL | Status: AC
  Filled 2021-01-07: qty 2

## 2021-01-07 MED ORDER — CEFAZOLIN SODIUM-DEXTROSE 2-4 GM/100ML-% IV SOLN
INTRAVENOUS | Status: AC
  Filled 2021-01-07: qty 100

## 2021-01-07 MED ORDER — DEXAMETHASONE SODIUM PHOSPHATE 4 MG/ML IJ SOLN
INTRAMUSCULAR | Status: DC | PRN
  Administered 2021-01-07: 10 mg via INTRAVENOUS

## 2021-01-07 MED ORDER — EPHEDRINE 5 MG/ML INJ
INTRAVENOUS | Status: AC
  Filled 2021-01-07: qty 10

## 2021-01-07 MED ORDER — TRAMADOL HCL 50 MG PO TABS
ORAL_TABLET | ORAL | Status: AC
  Filled 2021-01-07: qty 1

## 2021-01-07 MED ORDER — IOHEXOL 300 MG/ML  SOLN
INTRAMUSCULAR | Status: DC | PRN
  Administered 2021-01-07: 50 mL

## 2021-01-07 SURGICAL SUPPLY — 42 items
ADH SKN CLS APL DERMABOND .7 (GAUZE/BANDAGES/DRESSINGS) ×1
APL SKNCLS STERI-STRIP NONHPOA (GAUZE/BANDAGES/DRESSINGS)
BENZOIN TINCTURE PRP APPL 2/3 (GAUZE/BANDAGES/DRESSINGS) IMPLANT
BLADE CLIPPER SURG (BLADE) IMPLANT
CEMENT BONE KYPHX HV R (Orthopedic Implant) ×1 IMPLANT
CEMENT KYPHON C01A KIT/MIXER (Cement) ×1 IMPLANT
COVER MAYO STAND STRL (DRAPES) ×1 IMPLANT
COVER WAND RF STERILE (DRAPES) ×2 IMPLANT
DECANTER SPIKE VIAL GLASS SM (MISCELLANEOUS) ×2 IMPLANT
DERMABOND ADVANCED (GAUZE/BANDAGES/DRESSINGS) ×1
DERMABOND ADVANCED .7 DNX12 (GAUZE/BANDAGES/DRESSINGS) IMPLANT
DRAPE C-ARM 42X72 X-RAY (DRAPES) ×2 IMPLANT
DRAPE INCISE IOBAN 66X45 STRL (DRAPES) ×2 IMPLANT
DRAPE LAPAROTOMY 100X72X124 (DRAPES) ×2 IMPLANT
DRAPE SURG 17X23 STRL (DRAPES) ×2 IMPLANT
DRAPE WARM FLUID 44X44 (DRAPES) ×1 IMPLANT
DRSG AQUACEL AG 3.5X4 (GAUZE/BANDAGES/DRESSINGS) ×1 IMPLANT
DRSG OPSITE POSTOP 3X4 (GAUZE/BANDAGES/DRESSINGS) ×3 IMPLANT
DURAPREP 26ML APPLICATOR (WOUND CARE) ×2 IMPLANT
GAUZE 4X4 16PLY RFD (DISPOSABLE) IMPLANT
GLOVE BIOGEL PI IND STRL 7.5 (GLOVE) ×1 IMPLANT
GLOVE BIOGEL PI INDICATOR 7.5 (GLOVE) ×1
GLOVE ECLIPSE 7.5 STRL STRAW (GLOVE) ×2 IMPLANT
GLOVE SURG UNDER POLY LF SZ7.5 (GLOVE) ×3 IMPLANT
GOWN STRL REUS W/ TWL LRG LVL3 (GOWN DISPOSABLE) IMPLANT
GOWN STRL REUS W/ TWL XL LVL3 (GOWN DISPOSABLE) ×1 IMPLANT
GOWN STRL REUS W/TWL 2XL LVL3 (GOWN DISPOSABLE) IMPLANT
GOWN STRL REUS W/TWL LRG LVL3 (GOWN DISPOSABLE) ×2
GOWN STRL REUS W/TWL XL LVL3 (GOWN DISPOSABLE) ×2
KIT BASIN OR (CUSTOM PROCEDURE TRAY) ×2 IMPLANT
KIT TURNOVER KIT B (KITS) ×2 IMPLANT
MARKER SKIN DUAL TIP RULER LAB (MISCELLANEOUS) ×2 IMPLANT
NEEDLE HYPO 22GX1.5 SAFETY (NEEDLE) ×2 IMPLANT
NS IRRIG 1000ML POUR BTL (IV SOLUTION) ×2 IMPLANT
PACK LAMINECTOMY NEURO (CUSTOM PROCEDURE TRAY) ×2 IMPLANT
PAD ARMBOARD 7.5X6 YLW CONV (MISCELLANEOUS) ×3 IMPLANT
STRIP CLOSURE SKIN 1/4X4 (GAUZE/BANDAGES/DRESSINGS) IMPLANT
SUT VICRYL RAPIDE 3 0 (SUTURE) ×4 IMPLANT
TOWEL GREEN STERILE (TOWEL DISPOSABLE) ×2 IMPLANT
TOWEL GREEN STERILE FF (TOWEL DISPOSABLE) ×2 IMPLANT
TRAY KYPHOPAK 15/3 ONESTEP 1ST (MISCELLANEOUS) ×1 IMPLANT
WATER STERILE IRR 1000ML POUR (IV SOLUTION) ×2 IMPLANT

## 2021-01-07 NOTE — Progress Notes (Signed)
CRITICAL RESULT PROVIDER NOTIFICATION  Test performed and critical result:  Positive COVID  Date and time result received:  2:24 PM 01/07/21  Provider name/title: Dr Rodman Comp, Dr Dawley  Date and time provider notified: 2:28 PM   Date and time provider responded: 2:28 PM   Provider response:At bedside

## 2021-01-07 NOTE — Op Note (Signed)
   Providing Compassionate, Quality Care - Together  Date of service: 01/07/2021  PREOP DIAGNOSIS:  1. L3 osteoporotic compression fracture 2. Senile osteoporosis   POSTOP DIAGNOSIS: Same  PROCEDURE: 1. L3 kyphoplasty with medtronic cement 2. Use of fluoroscopy, less than 1 hour  SURGEON: Dr. Pieter Partridge C. Dorothye Berni, DO  ASSISTANT: None  ANESTHESIA: General Endotracheal  EBL: None  SPECIMENS: None  DRAINS: None  COMPLICATIONS: None  CONDITION: Hemodynamically stable  HISTORY: Jasmine Mccormick is a 87 y.o. female with a history of osteoporosis, that in March noted a pop in her low back while moving her husband.  Since then she had intractable low back pain despite rest and medications.  Work-up revealed an L3 compression fracture on MRI.  While being followed as an outpatient on repeat x-rays for compression fracture continued to progress and her pain remained uncontrolled.  Therefore we discussed possible bracing versus kyphoplasty, she did not think she could tolerate a brace due to her ileostomy bag.  Offered her a kyphoplasty in efforts to help treat her pain, discussed all the risks, benefits and expected outcomes with the patient and her niece and they agreed to proceed.  PROCEDURE IN DETAIL: The patient was brought to the operating room. After induction of general anesthesia, the patient was positioned on the operative table in the prone position. All pressure points were meticulously padded.  AP and lateral fluoroscopic images were obtained localizing the L3 compression fracture.  The thoracolumbar region was sterilely prepped and draped in normal fashion.  A physician driven timeout was performed.  Local anesthetic was injected in the skin and soft tissues over the left lower lumbar region.  Using a 15 blade, a left-sided poke incision was created and the left pedicle was cannulated using a Jamshidi needle.  This was done under AP and lateral fluoroscopic images with careful  attention noted to the medial border of the pedicle.  Once access to the vertebral body, I used the drill to create a tract.  I then used the balloon to try and reduce the superior endplate fracture.  There appeared to be some sclerosis and the balloon did not appear to have much effect on the fracture line itself.  The balloon was let down and removed.  I then filled the vertebral body defect with cement under AP and lateral fluoroscopic images.  The cement noted to remain within the vertebral body and there was no extravasation noted.  There was good bilateral fill of the vertebral body noted.  I waited for the cement to completely settle and harden.  The Jamshidi then was carefully removed and there was no retrograde flow of cement.  Final AP and lateral images confirmed appropriate placement of cement in the L3 compression fracture.  The wound was noted to be hemostatic.  The skin was then closed with Dermabond and sterile dressing was applied.  At the end of the case all sponge, needle, and instrument counts were correct. The patient was then transferred to the stretcher, extubated, and taken to the post-anesthesia care unit in stable hemodynamic condition.

## 2021-01-07 NOTE — Anesthesia Postprocedure Evaluation (Signed)
Anesthesia Post Note  Patient: Jasmine Mccormick  Procedure(s) Performed: Lumbar three Kyphoplasty (N/A )     Patient location during evaluation: PACU Anesthesia Type: General Level of consciousness: awake and alert Pain management: pain level controlled Vital Signs Assessment: post-procedure vital signs reviewed and stable Respiratory status: spontaneous breathing, nonlabored ventilation, respiratory function stable and patient connected to nasal cannula oxygen Cardiovascular status: blood pressure returned to baseline and stable Postop Assessment: no apparent nausea or vomiting Anesthetic complications: no   No complications documented.  Last Vitals:  Vitals:   01/07/21 1610 01/07/21 1640  BP: (!) 146/68 (!) 153/68  Pulse: 61 67  Resp: 12 17  Temp:  (!) 36.2 C  SpO2: 97% 92%    Last Pain:  Vitals:   01/07/21 1540  TempSrc:   PainSc: Asleep                 Tiajuana Amass

## 2021-01-07 NOTE — Progress Notes (Signed)
   Providing Compassionate, Quality Care - Together  NEUROSURGERY PROGRESS NOTE   S: Patient seen and examined in recovery room.  Complains of incisional pain  O: EXAM:  BP (!) 153/68   Pulse 67   Temp (!) 97 F (36.1 C)   Resp 17   Ht 5' 5.5" (1.664 m)   Wt 73.9 kg   LMP  (LMP Unknown)   SpO2 92%   BMI 26.70 kg/m   Awake, alert, oriented  Speech fluent, appropriate  CNs grossly intact  5/5 BUE/BLE  Dressing clean dry and intact Sensory intact light touch PERRLA  ASSESSMENT:  87 y.o. female with  1.  L3 compression fracture  -Status post L3 kyphoplasty on 01/07/2021  PLAN: -DC home -Instructions given to the patient and her niece -Meds prescribed -Follow-up in 1 month -Activity as tolerated    Thank you for allowing me to participate in this patient's care.  Please do not hesitate to call with questions or concerns.   Elwin Sleight, [facility] Cell: [phone]

## 2021-01-07 NOTE — H&P (Signed)
Providing Compassionate, Quality Care - Together  NEUROSURGERY HISTORY & PHYSICAL   Jasmine Mccormick is an 87 y.o. female.   Chief Complaint: LBP HPI: This is an 87 year old female whom felt her back pop in the middle of March.  Since then she has had severe low back pain intractable to medications.  She cannot have a brace due to her ileostomy.  She was found to have an L3 compression fracture that progressively worsened on follow-up x-rays.  Her pain has been intractable since then she been unable to tolerate movement and normal activity.  She does have a history of osteoporosis.  She presents today for an L3 kyphoplasty.  No changes in her symptomatology at this time.  Past Medical History:  Diagnosis Date  . COVID 12/18/2020   mild  . HTN (hypertension)    Normal at home  . Hyperlipidemia   . Hypothyroidism   . Ileostomy in place [facility])   . Memory loss    mild but not dx by doctor per Neice  . Meniere disease 1996  . Osteoporosis   . UC (ulcerative colitis) [facility])     Past Surgical History:  Procedure Laterality Date  . COLECTOMY    . COLONOSCOPY    . Ear Shunt for Menier's    . ILEOSTOMY    . PROCTECTOMY      Family History  Problem Relation Age of Onset  . Hypertension Other   . Heart disease Other   . Breast cancer Sister    Social History:  reports that she has never smoked. She has never used smokeless tobacco. She reports that she does not drink alcohol and does not use drugs.  Allergies: No Known Allergies  Medications Prior to Admission  Medication Sig Dispense Refill  . lidocaine (LIDODERM) 5 % Place 1 patch onto the skin daily. Remove & Discard patch within 12 hours or as directed by MD 30 patch 0  . meloxicam (MOBIC) 15 MG tablet Take 15 mg by mouth daily as needed for pain (Back).    . metoprolol succinate (TOPROL-XL) 25 MG 24 hr tablet TAKE 1 TABLET BY MOUTH DAILY AT NIGHT (Patient taking differently: Take 25 mg by mouth at bedtime.) 90 tablet 3   . Multiple Vitamins-Minerals (MULTIVITAMIN,TX-MINERALS) tablet Take 1 tablet by mouth daily.    [name] SYNTHROID 50 MCG tablet Take 75 mcg a day Mon-Fri and 50 mcg a day on Sat and Sun (Patient taking differently: Take 50 mcg by mouth daily before breakfast.) 90 tablet 3  . verapamil (CALAN) 80 MG tablet Take 1 tablet (80 mg total) by mouth 2 (two) times daily. 180 tablet 3  . EZETIMIBE-SIMVASTATIN PO Take 1 tablet by mouth at bedtime.    Wilfrid Lund & Ritonavir (PAXLOVID) 20 x 150 MG & 10 x 100MG TBPK Take 3 tablets by mouth in the morning and at bedtime. As directed on a package (Patient taking differently: Take 3 tablets by mouth as needed (Covid). As directed on a package) 30 tablet 0  . perindopril (ACEON) 8 MG tablet Take 1 tablet (8 mg total) by mouth daily. (Patient not taking: No sig reported) 90 tablet 3  . simvastatin (ZOCOR) 20 MG tablet Take 1 tablet (20 mg total) by mouth at bedtime. (Patient not taking: No sig reported) 90 tablet 3    Results for orders placed or performed during the hospital encounter of 01/07/21 (from the past 48 hour(s))  CBC per protocol     Status:  None   Collection Time: 01/07/21 11:54 AM  Result Value Ref Range   WBC 7.3 4.0 - 10.5 K/uL   RBC 4.47 3.87 - 5.11 MIL/uL   Hemoglobin 14.1 12.0 - 15.0 g/dL   HCT 42.3 36.0 - 46.0 %   MCV 94.6 80.0 - 100.0 fL   MCH 31.5 26.0 - 34.0 pg   MCHC 33.3 30.0 - 36.0 g/dL   RDW 13.2 11.5 - 15.5 %   Platelets 228 150 - 400 K/uL   nRBC 0.0 0.0 - 0.2 %    Comment: Performed at [facility], [place] [address], [place], [place] [address]  Basic metabolic panel per protocol     Status: Abnormal   Collection Time: 01/07/21 11:54 AM  Result Value Ref Range   Sodium 131 (L) 135 - 145 mmol/L   Potassium 4.1 3.5 - 5.1 mmol/L   Chloride 99 98 - 111 mmol/L   CO2 23 22 - 32 mmol/L   Glucose, Bld 108 (H) 70 - 99 mg/dL    Comment: Glucose reference range applies only to samples taken after fasting for at least 8 hours.   BUN  16 8 - 23 mg/dL   Creatinine, Ser 0.74 0.44 - 1.00 mg/dL   Calcium 9.3 8.9 - 10.3 mg/dL   GFR, Estimated >60 >60 mL/min    Comment: (NOTE) Calculated using the CKD-EPI Creatinine Equation (2021)    Anion gap 9 5 - 15    Comment: Performed at [facility] [address], [place], [place] [address]   No results found.  ROS 14 point reviewed, all positives negatives listed in HPI above Blood pressure (!) 236/80, pulse 77, temperature 98.2 F (36.8 C), temperature source Oral, resp. rate 17, height 5' 5.5" (1.664 m), weight 73.9 kg, SpO2 95 %. Physical Exam  ANO x3 PERRLA EOMI TTP lumbar spine Moves all extremities equally Ileostomy bag  Assessment/Plan 87 year old female with  1.  L3 osteoporotic compression fracture  -Kyphoplasty today.  All risks, benefits and expected outcomes discussed with the patient and her family.  They agreed to proceed.  Thank you for allowing me to participate in this patient's care.  Please do not hesitate to call with questions or concerns.   Elwin Sleight, [facility] Cell: [phone]

## 2021-01-07 NOTE — Progress Notes (Addendum)
Patient was positive for COVID 12/18/20 on a home test per Patinet's Niece

## 2021-01-07 NOTE — Anesthesia Preprocedure Evaluation (Signed)
Anesthesia Evaluation  Patient identified by MRN, date of birth, ID band Patient awake    Reviewed: Allergy & Precautions, NPO status , Patient's Chart, lab work & pertinent test results  Airway Mallampati: II  TM Distance: >3 FB     Dental  (+) Dental Advisory Given   Pulmonary neg pulmonary ROS,    breath sounds clear to auscultation       Cardiovascular hypertension, Pt. on medications and Pt. on home beta blockers  Rhythm:Regular Rate:Normal     Neuro/Psych  Neuromuscular disease    GI/Hepatic Neg liver ROS, PUD,   Endo/Other  Hypothyroidism   Renal/GU negative Renal ROS     Musculoskeletal   Abdominal   Peds  Hematology negative hematology ROS (+)   Anesthesia Other Findings   Reproductive/Obstetrics                             Anesthesia Physical Anesthesia Plan  ASA: III  Anesthesia Plan: General   Post-op Pain Management:    Induction: Intravenous  PONV Risk Score and Plan: 3 and Ondansetron, Dexamethasone and Treatment may vary due to age or medical condition  Airway Management Planned: Oral ETT  Additional Equipment: None  Intra-op Plan:   Post-operative Plan: Extubation in OR  Informed Consent: I have reviewed the patients History and Physical, chart, labs and discussed the procedure including the risks, benefits and alternatives for the proposed anesthesia with the patient or authorized representative who has indicated his/her understanding and acceptance.     Dental advisory given  Plan Discussed with: CRNA  Anesthesia Plan Comments:         Anesthesia Quick Evaluation

## 2021-01-07 NOTE — Transfer of Care (Signed)
Immediate Anesthesia Transfer of Care Note  Patient: Jasmine Mccormick  Procedure(s) Performed: Lumbar three Kyphoplasty (N/A )  Patient Location: PACU  Anesthesia Type:General  Level of Consciousness: awake and patient cooperative  Airway & Oxygen Therapy: Patient Spontanous Breathing and Patient connected to face mask oxygen  Post-op Assessment: Report given to RN and Post -op Vital signs reviewed and stable  Post vital signs: Reviewed and stable  Last Vitals:  Vitals Value Taken Time  BP 166/59 01/07/21 1540  Temp    Pulse 66 01/07/21 1542  Resp 19 01/07/21 1542  SpO2 100 % 01/07/21 1542  Vitals shown include unvalidated device data.  Last Pain:  Vitals:   01/07/21 1218  TempSrc:   PainSc: 0-No pain         Complications: No complications documented.

## 2021-01-07 NOTE — Anesthesia Procedure Notes (Signed)
Procedure Name: Intubation Date/Time: 01/07/2021 2:49 PM Performed by: Renato Shin, CRNA Pre-anesthesia Checklist: Patient identified, Emergency Drugs available, Suction available and Patient being monitored Patient Re-evaluated:Patient Re-evaluated prior to induction Oxygen Delivery Method: Circle system utilized Preoxygenation: Pre-oxygenation with 100% oxygen Induction Type: IV induction and Rapid sequence Laryngoscope Size: Miller and 2 Grade View: Grade I Tube type: Oral Tube size: 7.0 mm Number of attempts: 1 Airway Equipment and Method: Stylet and Oral airway Placement Confirmation: ETT inserted through vocal cords under direct vision,  positive ETCO2 and breath sounds checked- equal and bilateral Secured at: 21 cm Tube secured with: Tape Dental Injury: Teeth and Oropharynx as per pre-operative assessment

## 2021-01-08 ENCOUNTER — Encounter (HOSPITAL_COMMUNITY): Payer: Self-pay | Admitting: Neurological Surgery

## 2021-01-27 ENCOUNTER — Telehealth: Payer: Self-pay | Admitting: Internal Medicine

## 2021-01-27 DIAGNOSIS — N390 Urinary tract infection, site not specified: Secondary | ICD-10-CM

## 2021-01-27 DIAGNOSIS — R350 Frequency of micturition: Secondary | ICD-10-CM

## 2021-01-27 DIAGNOSIS — R300 Dysuria: Secondary | ICD-10-CM

## 2021-01-27 NOTE — Telephone Encounter (Signed)
Patients niece called and said that patient is experiencing frequent urination and burning when urinating. She was wondering if a UA could be order. She can be reached at [phone]. Please advise

## 2021-01-27 NOTE — Telephone Encounter (Signed)
Patients niece called again.

## 2021-01-27 NOTE — Telephone Encounter (Signed)
Verbal Orders rec'd from Dr Alain Marion for u/a & culture for urinary frequency & dysuria

## 2021-01-28 ENCOUNTER — Other Ambulatory Visit: Payer: Medicare Other

## 2021-01-28 ENCOUNTER — Other Ambulatory Visit (INDEPENDENT_AMBULATORY_CARE_PROVIDER_SITE_OTHER): Payer: Medicare Other

## 2021-01-28 DIAGNOSIS — R300 Dysuria: Secondary | ICD-10-CM

## 2021-01-28 DIAGNOSIS — S32030A Wedge compression fracture of third lumbar vertebra, initial encounter for closed fracture: Secondary | ICD-10-CM | POA: Diagnosis not present

## 2021-01-28 DIAGNOSIS — R350 Frequency of micturition: Secondary | ICD-10-CM | POA: Diagnosis not present

## 2021-01-28 LAB — URINALYSIS, ROUTINE W REFLEX MICROSCOPIC
Bilirubin Urine: NEGATIVE
Hgb urine dipstick: NEGATIVE
Ketones, ur: NEGATIVE
Nitrite: NEGATIVE
RBC / HPF: NONE SEEN (ref 0–?)
Specific Gravity, Urine: 1.01 (ref 1.000–1.030)
Total Protein, Urine: NEGATIVE
Urine Glucose: NEGATIVE
Urobilinogen, UA: 0.2 (ref 0.0–1.0)
pH: 6.5 (ref 5.0–8.0)

## 2021-01-29 MED ORDER — NITROFURANTOIN MONOHYD MACRO 100 MG PO CAPS: 100.0000 mg | ORAL_CAPSULE | Freq: Two times a day (BID) | ORAL | 0 refills | Status: DC

## 2021-01-29 NOTE — Telephone Encounter (Signed)
Email was sent this am with same information. Email was sent to another provider to review UA since MD is out of the office. See new email closing this encounter.Marland KitchenJohny Chess

## 2021-01-29 NOTE — Telephone Encounter (Signed)
Pls advise on UA results in absence of PCP.Marland KitchenJohny Mccormick

## 2021-01-30 LAB — URINE CULTURE
MICRO NUMBER:: 12015885
SPECIMEN QUALITY:: ADEQUATE

## 2021-02-01 ENCOUNTER — Other Ambulatory Visit: Payer: Self-pay | Admitting: Internal Medicine

## 2021-02-01 DIAGNOSIS — N390 Urinary tract infection, site not specified: Secondary | ICD-10-CM | POA: Insufficient documentation

## 2021-02-01 MED ORDER — CIPROFLOXACIN HCL 250 MG PO TABS: 250.0000 mg | ORAL_TABLET | Freq: Two times a day (BID) | ORAL | 0 refills | Status: DC

## 2021-02-01 NOTE — Telephone Encounter (Signed)
Jasmine Mccormick, This is a lab message I have sent to Jasmine Mccormick:  Dear Jasmine Mccormick, Your urine culture grew out a bacteria called Morganella morganii which is resistant to several antibiotics.  We will make an effort to eliminate it with antibiotic called Cipro (this bacteria should be sensitive to Cipro).  I will send antibiotic to your pharmacy.  Please take it for 10 days.   Thx

## 2021-02-02 ENCOUNTER — Other Ambulatory Visit: Payer: Self-pay | Admitting: Internal Medicine

## 2021-02-02 NOTE — Telephone Encounter (Signed)
Daughter is wanting to know if she need to stop the Macrobid that was rx by Dr. Sharlet Salina. She sent a mychart msg../lmb

## 2021-02-04 NOTE — Telephone Encounter (Signed)
Pt daughter was aware of the med change.Closing encounter.Marland KitchenJohny Chess

## 2021-02-04 NOTE — Telephone Encounter (Signed)
Yes.  I think she is aware.  Thanks

## 2021-02-16 ENCOUNTER — Other Ambulatory Visit: Payer: Self-pay

## 2021-02-16 ENCOUNTER — Ambulatory Visit (INDEPENDENT_AMBULATORY_CARE_PROVIDER_SITE_OTHER): Payer: Medicare Other | Admitting: Podiatry

## 2021-02-16 DIAGNOSIS — M79674 Pain in right toe(s): Secondary | ICD-10-CM | POA: Diagnosis not present

## 2021-02-16 DIAGNOSIS — M79675 Pain in left toe(s): Secondary | ICD-10-CM | POA: Diagnosis not present

## 2021-02-16 DIAGNOSIS — B351 Tinea unguium: Secondary | ICD-10-CM

## 2021-02-16 DIAGNOSIS — S32039A Unspecified fracture of third lumbar vertebra, initial encounter for closed fracture: Secondary | ICD-10-CM | POA: Insufficient documentation

## 2021-02-19 ENCOUNTER — Encounter: Payer: Self-pay | Admitting: Podiatry

## 2021-02-19 NOTE — Progress Notes (Signed)
Subjective: Jasmine Mccormick is a pleasant 87 y.o. female patient seen today painful thick toenails that are difficult to trim. Pain interferes with ambulation. Aggravating factors include wearing enclosed shoe gear. Pain is relieved with periodic professional debridement.  She relates no new pedal problems on today's visit. Sadly, she is grieving the loss of her husband who passed away on 2023-01-31.  PCP is Plotnikov, Evie Lacks, MD. Last visit was: 09/17/2020.  No Known Allergies  Objective: Physical Exam  General: Jasmine Mccormick is a pleasant 87 y.o. Caucasian female, WD, WN in NAD. AAO x 3.   Vascular:  Capillary refill time to digits immediate b/l. Palpable pedal pulses b/l LE. Pedal hair present. Lower extremity skin temperature gradient within normal limits. No edema noted b/l lower extremities.  Dermatological:  Pedal skin with normal turgor, texture and tone b/l lower extremities No open wounds b/l lower extremities No interdigital macerations b/l lower extremities Toenails 1-5 b/l elongated, discolored, dystrophic, thickened, crumbly with subungual debris and tenderness to dorsal palpation.  Musculoskeletal:  Normal muscle strength 5/5 to all lower extremity muscle groups bilaterally. No pain crepitus or joint limitation noted with ROM b/l. No gross bony deformities bilaterally.  Neurological:  Protective sensation intact 5/5 intact bilaterally with 10g monofilament b/l. Vibratory sensation intact b/l. Proprioception intact bilaterally.  Assessment and Plan:  1. Pain due to onychomycosis of toenails of both feet     -No new findings. No new orders. -Patient to continue soft, supportive shoe gear daily. -Toenails 1-5 b/l were debrided in length and girth with sterile nail nippers and dremel without iatrogenic bleeding.  -Patient to report any pedal injuries to medical professional immediately. -Patient/POA to call should there be question/concern in the  interim.  Return in about 3 months (around 05/19/2021).  Marzetta Board, DPM

## 2021-03-08 ENCOUNTER — Telehealth: Payer: Self-pay | Admitting: Internal Medicine

## 2021-03-08 NOTE — Telephone Encounter (Signed)
LV for pt to rtn my call to schedule AWV with [facility]. Please schedule this appt if pt calls the office.

## 2021-03-10 ENCOUNTER — Ambulatory Visit (INDEPENDENT_AMBULATORY_CARE_PROVIDER_SITE_OTHER): Payer: Medicare Other

## 2021-03-10 DIAGNOSIS — M8008XD Age-related osteoporosis with current pathological fracture, vertebra(e), subsequent encounter for fracture with routine healing: Secondary | ICD-10-CM | POA: Diagnosis not present

## 2021-03-10 DIAGNOSIS — Z932 Ileostomy status: Secondary | ICD-10-CM | POA: Diagnosis not present

## 2021-03-10 DIAGNOSIS — Z0000 Encounter for general adult medical examination without abnormal findings: Secondary | ICD-10-CM

## 2021-03-10 DIAGNOSIS — G8929 Other chronic pain: Secondary | ICD-10-CM | POA: Diagnosis not present

## 2021-03-10 DIAGNOSIS — Z9181 History of falling: Secondary | ICD-10-CM | POA: Diagnosis not present

## 2021-03-10 NOTE — Progress Notes (Addendum)
I connected with Jasmine Mccormick today by telephone and verified that I am speaking with the correct person using two identifiers. Location patient: home Location provider: work Persons participating in the virtual visit: Sumayah Bearse and [place], LPN.   I discussed the limitations, risks, security and privacy concerns of performing an evaluation and management service by telephone and the availability of in person appointments. I also discussed with the patient that there may be a patient responsible charge related to this service. The patient expressed understanding and verbally consented to this telephonic visit.    Interactive audio and video telecommunications were attempted between this provider and patient, however failed, due to patient having technical difficulties OR patient did not have access to video capability.  We continued and completed visit with audio only.  Some vital signs may be absent or patient reported.   Time Spent with patient on telephone encounter: 30 minutes  Subjective:   Jasmine Mccormick is a 87 y.o. female who presents for Medicare Annual (Subsequent) preventive examination.  Review of Systems     Cardiac Risk Factors include: advanced age (>19mn, >>90women);family history of premature cardiovascular disease;hypertension     Objective:    Today's Vitals   03/10/21 1452  PainSc: 8    There is no height or weight on file to calculate BMI.  Advanced Directives 03/10/2021 11/30/2017  Does Patient Have a Medical Advance Directive? Yes No;Yes  Type of Advance Directive Living will;[place]care [place]  Does patient want to make changes to medical advance directive? No - Patient declined -  Copy of [place] Chart? No - copy requested No - copy requested    Current Medications (verified) Outpatient Encounter Medications as of 03/10/2021  Medication Sig    EZETIMIBE-SIMVASTATIN PO Take 1 tablet by mouth at bedtime.   lidocaine (LIDODERM) 5 % Place 1 patch onto the skin daily. Remove & Discard patch within 12 hours or as directed by MD   meloxicam (MOBIC) 15 MG tablet Take 15 mg by mouth daily as needed for pain (Back).   methocarbamol (ROBAXIN) 500 MG tablet Take 1 tablet (500 mg total) by mouth every 8 (eight) hours as needed for muscle spasms.   metoprolol succinate (TOPROL-XL) 25 MG 24 hr tablet TAKE 1 TABLET BY MOUTH DAILY AT NIGHT (Patient taking differently: Take 25 mg by mouth at bedtime.)   Multiple Vitamins-Minerals (MULTIVITAMIN,TX-MINERALS) tablet Take 1 tablet by mouth daily.   Nirmatrelvir & Ritonavir (PAXLOVID) 20 x 150 MG & 10 x 100MG TBPK Take 3 tablets by mouth in the morning and at bedtime. As directed on a package (Patient taking differently: Take 3 tablets by mouth as needed (Covid). As directed on a package)   SYNTHROID 50 MCG tablet Take 75 mcg a day Mon-Fri and 50 mcg a day on Sat and Sun (Patient taking differently: Take 50 mcg by mouth daily before breakfast.)   traMADol (ULTRAM) 50 MG tablet Take 1 tablet (50 mg total) by mouth every 6 (six) hours as needed.   verapamil (CALAN) 80 MG tablet Take 1 tablet (80 mg total) by mouth 2 (two) times daily.   [DISCONTINUED] ciprofloxacin (CIPRO) 250 MG tablet Take 1 tablet (250 mg total) by mouth 2 (two) times daily. (Patient not taking: Reported on 03/10/2021)   No facility-administered encounter medications on file as of 03/10/2021.    Allergies (verified) Patient has no known allergies.   History: Past Medical History:  Diagnosis Date   COVID 12/18/2020   mild   HTN (hypertension)    Normal at home   Hyperlipidemia    Hypothyroidism    Ileostomy in place [facility])    Memory loss    mild but not dx by doctor per [place]   Meniere disease 1996   Osteoporosis    UC (ulcerative colitis) [facility])    Past Surgical History:  Procedure Laterality Date   COLECTOMY     COLONOSCOPY      Ear Shunt for Menier's     ILEOSTOMY     KYPHOPLASTY N/A 01/07/2021   Procedure: Lumbar three Kyphoplasty;  Surgeon: Dawley, Theodoro Doing, DO;  Location: [place];  Service: Neurosurgery;  Laterality: N/A;   PROCTECTOMY     Family History  Problem Relation Age of Onset   Hypertension Other    Heart disease Other    Breast cancer Sister    Social History   Socioeconomic History   Marital status: Widowed    Spouse name: Not on file   Number of children: Not on file   Years of education: 12   Highest education level: Not on file  Occupational History   Occupation: Retired  Tobacco Use   Smoking status: Never   Smokeless tobacco: Never  Vaping Use   Vaping Use: Never used  Substance and Sexual Activity   Alcohol use: No   Drug use: No   Sexual activity: Not Currently    Birth control/protection: Post-menopausal  Other Topics Concern   Not on file  Social History Narrative   Not on file   Social Determinants of Health   Financial Resource Strain: Low Risk    Difficulty of Paying Living Expenses: Not hard at all  Food Insecurity: No Food Insecurity   Worried About Charity fundraiser in the Last Year: Never true   [place] in the Last Year: Never true  Transportation Needs: No Transportation Needs   Lack of Transportation (Medical): No   Lack of Transportation (Non-Medical): No  Physical Activity: Inactive   Days of Exercise per Week: 0 days   Minutes of Exercise per Session: 0 min  Stress: No Stress Concern Present   Feeling of Stress : Not at all  Social Connections: Moderately Integrated   Frequency of Communication with Friends and Family: More than three times a week   Frequency of Social Gatherings with Friends and Family: More than three times a week   Attends Religious Services: More than 4 times per year   Active Member of [employer] or Organizations: Yes   Attends Archivist Meetings: More than 4 times per year   Marital Status: Widowed    Tobacco  Counseling Counseling given: Not Answered   Clinical Intake:  Pre-visit preparation completed: Yes  Pain : 0-10 Pain Score: 8  Pain Type: Chronic pain Pain Location: Back Pain Orientation: Lower Pain Radiating Towards: n/a Pain Descriptors / Indicators: Aching, Throbbing Pain Onset: More than a month ago Pain Frequency: Intermittent Pain Relieving Factors: Aleve Effect of Pain on Daily Activities: Pain produces disability and affects the quality of life.  Pain Relieving Factors: Aleve  Nutritional Risks: None Diabetes: No  How often do you need to have someone help you when you read instructions, pamphlets, or other written materials from your doctor or pharmacy?: 1 - Never What is the last grade level you completed in school?: [place]; Secreterial/Business Education  Diabetic? no  Interpreter Needed?: No  Information entered by :: Lisette Abu, LPN   Activities of Daily Living In your present state of health, do you have any difficulty performing the following activities: 03/10/2021 01/07/2021  Hearing? N -  Vision? N -  Difficulty concentrating or making decisions? N -  Walking or climbing stairs? N -  Dressing or bathing? N -  Doing errands, shopping? Y N  Preparing Food and eating ? N -  Using the Toilet? N -  In the past six months, have you accidently leaked urine? N -  Do you have problems with loss of bowel control? N -  Managing your Medications? N -  Managing your Finances? N -  Housekeeping or managing your Housekeeping? N -  Some recent data might be hidden    Patient Care Team: Plotnikov, Evie Lacks, MD as PCP - General Monna Fam, MD as Consulting Physician (Ophthalmology)  Indicate any recent Medical Services you may have received from other than [facility] providers in the past year (date may be approximate).     Assessment:   This is a routine wellness examination for [place].  Hearing/Vision screen Hearing Screening -  Comments:: Patient denied any hearing difficulty.  Vision Screening - Comments:: Patient wears glasses; [facility] at [facility].  Dietary issues and exercise activities discussed: Current Exercise Habits: The patient does not participate in regular exercise at present, Exercise limited by: orthopedic condition(s)   Goals Addressed   None   Depression Screen PHQ 2/9 Scores 03/10/2021 09/17/2020 02/20/2020 11/30/2017 10/18/2017 09/14/2016 03/06/2015  PHQ - 2 Score 0 0 0 0 0 0 0  PHQ- 9 Score - 0 - 2 - - -    Fall Risk Fall Risk  03/10/2021 09/17/2020 07/05/2019 06/25/2018 11/30/2017  Falls in the past year? 0 1 1 1  No  Comment - - Emmi Telephone Survey: data to providers prior to load - -  Number falls in past yr: 0 0 1 0 -  Comment - - Emmi Telephone Survey Actual Response = 99 - -  Injury with Fall? 0 0 0 1 -  Risk for fall due to : No Fall Risks Impaired balance/gait - - -  Follow up Falls evaluation completed - - Falls evaluation completed -    FALL RISK PREVENTION PERTAINING TO THE HOME:  Any stairs in or around the home? No  If so, are there any without handrails? No  Home free of loose throw rugs in walkways, pet beds, electrical cords, etc? Yes  Adequate lighting in your home to reduce risk of falls? Yes   ASSISTIVE DEVICES UTILIZED TO PREVENT FALLS:  Life alert? No  Use of a cane, walker or w/c? Yes  Grab bars in the bathroom? Yes  Shower chair or bench in shower? No  Elevated toilet seat or a handicapped toilet? Yes   TIMED UP AND GO:  Was the test performed? No .  Length of time to ambulate 10 feet: 0 sec.   Gait steady and fast with assistive device (per patient)  Cognitive Function:Normal cognitive status assessed by direct observation by this Nurse Health Advisor. No abnormalities found.   MMSE - Mini Mental State Exam 11/30/2017  Orientation to time 5  Orientation to Place 5  Registration 3  Attention/ Calculation 5  Recall 2  Language- name 2  objects 2  Language- repeat 1  Language- follow 3 step command 3  Language- read & follow direction 1  Write a sentence 1  Copy design  1  Total score 29        Immunizations Immunization History  Administered Date(s) Administered   Influenza Split 05/30/2011, 05/15/2012   Influenza Whole 05/15/2006, 05/04/2010   Influenza, High Dose Seasonal PF 05/16/2015, 05/31/2016, 05/20/2017, 05/08/2018, 05/09/2019   Influenza-Unspecified 05/15/2013, 05/15/2014   PFIZER(Purple Top)SARS-COV-2 Vaccination 09/03/2019, 09/23/2019, 06/10/2020   Pneumococcal Conjugate-13 08/01/2013   Pneumococcal Polysaccharide-23 07/04/2012, 09/17/2020   Td 07/04/2012   Zoster Recombinat (Shingrix) 09/29/2016    TDAP status: Up to date  Flu Vaccine status: Up to date  Pneumococcal vaccine status: Up to date  Covid-19 vaccine status: Completed vaccines  Qualifies for Shingles Vaccine? Yes   Zostavax completed No   Shingrix Completed?: No.    Education has been provided regarding the importance of this vaccine. Patient has been advised to call insurance company to determine out of pocket expense if they have not yet received this vaccine. Advised may also receive vaccine at local pharmacy or Health Dept. Verbalized acceptance and understanding.  Screening Tests Health Maintenance  Topic Date Due   Zoster Vaccines- Shingrix (2 of 2) 11/24/2016   COVID-19 Vaccine (4 - Booster for Pfizer series) 09/10/2020   INFLUENZA VACCINE  03/15/2021   TETANUS/TDAP  07/04/2022   DEXA SCAN  Completed   PNA vac Low Risk Adult  Completed   HPV VACCINES  Aged Out    Health Maintenance  Health Maintenance Due  Topic Date Due   Zoster Vaccines- Shingrix (2 of 2) 11/24/2016   COVID-19 Vaccine (4 - Booster for Pfizer series) 09/10/2020    Colorectal cancer screening: No longer required.   Mammogram status: Completed 03/08/2018. Repeat every year  Bone Density status: Completed 10/18/2017. Results reflect: Bone density  results: NORMAL. Repeat every 3-5 years.  Lung Cancer Screening: (Low Dose CT Chest recommended if Age 8-80 years, 30 pack-year currently smoking OR have quit w/in 15years.) does not qualify.   Lung Cancer Screening Referral: no  Additional Screening:  Hepatitis C Screening: does not qualify; Completed no  Vision Screening: Recommended annual ophthalmology exams for early detection of glaucoma and other disorders of the eye. Is the patient up to date with their annual eye exam?  Yes  Who is the provider or what is the name of the office in which the patient attends annual eye exams? [facility] If pt is not established with a provider, would they like to be referred to a provider to establish care? No .   Dental Screening: Recommended annual dental exams for proper oral hygiene  Community Resource Referral / Chronic Care Management: CRR required this visit?  No   CCM required this visit?  No      Plan:     I have personally reviewed and noted the following in the patient's chart:   Medical and social history Use of alcohol, tobacco or illicit drugs  Current medications and supplements including opioid prescriptions.  Functional ability and status Nutritional status Physical activity Advanced directives List of other physicians Hospitalizations, surgeries, and ER visits in previous 12 months Vitals Screenings to include cognitive, depression, and falls Referrals and appointments  In addition, I have reviewed and discussed with patient certain preventive protocols, quality metrics, and best practice recommendations. A written personalized care plan for preventive services as well as general preventive health recommendations were provided to patient.     Sheral Flow, LPN   [id number]   Nurse Notes:  Patient is cogitatively intact. There were no vitals filed for this visit.  There is no height or weight on file to calculate BMI.   Medical screening  examination/treatment/procedure(s) were performed by non-physician practitioner and as supervising physician I was immediately available for consultation/collaboration.  I agree with above. Lew Dawes, MD

## 2021-03-10 NOTE — Patient Instructions (Signed)
Jasmine Mccormick , Thank you for taking time to come for your Medicare Wellness Visit. I appreciate your ongoing commitment to your health goals. Please review the following plan we discussed and let me know if I can assist you in the future.   Screening recommendations/referrals: Colonoscopy: not a candidate for colon cancer screening due to age. Mammogram: last done 03/08/2018 Bone Density: last done 10/18/2017 Recommended yearly ophthalmology/optometry visit for glaucoma screening and checkup Recommended yearly dental visit for hygiene and checkup  Vaccinations: Influenza vaccine: 05/13/2020 Pneumococcal vaccine: 08/01/2013, 09/17/2020 Tdap vaccine: 07/04/2012; due every 10 years Shingles vaccine: 09/29/2016; need record of second dose   Covid-19:09/03/2019, 09/23/2019, 06/10/2020  Advanced directives: Please bring a copy of your health care power of attorney and living will to the office at your convenience.  Conditions/risks identified: Yes; Client understands the importance of follow-up with providers by attending scheduled visits and discussed goals to eat healthier, increase physical activity, exercise the brain, socialize more, get enough sleep and make time for laughter.  Next appointment: Please schedule your next Medicare Wellness Visit with your Nurse Health Advisor in 1 year by calling [phone].   Preventive Care 49 Years and Older, Female Preventive care refers to lifestyle choices and visits with your health care provider that can promote health and wellness. What does preventive care include? A yearly physical exam. This is also called an annual well check. Dental exams once or twice a year. Routine eye exams. Ask your health care provider how often you should have your eyes checked. Personal lifestyle choices, including: Daily care of your teeth and gums. Regular physical activity. Eating a healthy diet. Avoiding tobacco and drug use. Limiting alcohol use. Practicing safe  sex. Taking low-dose aspirin every day. Taking vitamin and mineral supplements as recommended by your health care provider. What happens during an annual well check? The services and screenings done by your health care provider during your annual well check will depend on your age, overall health, lifestyle risk factors, and family history of disease. Counseling  Your health care provider may ask you questions about your: Alcohol use. Tobacco use. Drug use. Emotional well-being. Home and relationship well-being. Sexual activity. Eating habits. History of falls. Memory and ability to understand (cognition). Work and work Statistician. Reproductive health. Screening  You may have the following tests or measurements: Height, weight, and BMI. Blood pressure. Lipid and cholesterol levels. These may be checked every 5 years, or more frequently if you are over 97 years old. Skin check. Lung cancer screening. You may have this screening every year starting at age 40 if you have a 30-pack-year history of smoking and currently smoke or have quit within the past 15 years. Fecal occult blood test (FOBT) of the stool. You may have this test every year starting at age 26. Flexible sigmoidoscopy or colonoscopy. You may have a sigmoidoscopy every 5 years or a colonoscopy every 10 years starting at age 44. Hepatitis C blood test. Hepatitis B blood test. Sexually transmitted disease (STD) testing. Diabetes screening. This is done by checking your blood sugar (glucose) after you have not eaten for a while (fasting). You may have this done every 1-3 years. Bone density scan. This is done to screen for osteoporosis. You may have this done starting at age 30. Mammogram. This may be done every 1-2 years. Talk to your health care provider about how often you should have regular mammograms. Talk with your health care provider about your test results, treatment options, and if necessary,  the need for more  tests. Vaccines  Your health care provider may recommend certain vaccines, such as: Influenza vaccine. This is recommended every year. Tetanus, diphtheria, and acellular pertussis (Tdap, Td) vaccine. You may need a Td booster every 10 years. Zoster vaccine. You may need this after age 84. Pneumococcal 13-valent conjugate (PCV13) vaccine. One dose is recommended after age 53. Pneumococcal polysaccharide (PPSV23) vaccine. One dose is recommended after age 86. Talk to your health care provider about which screenings and vaccines you need and how often you need them. This information is not intended to replace advice given to you by your health care provider. Make sure you discuss any questions you have with your health care provider. Document Released: 08/28/2015 Document Revised: 04/20/2016 Document Reviewed: 06/02/2015 Elsevier Interactive Patient Education  2017 [employer] Prevention in the Home Falls can cause injuries. They can happen to people of all ages. There are many things you can do to make your home safe and to help prevent falls. What can I do on the outside of my home? Regularly fix the edges of walkways and driveways and fix any cracks. Remove anything that might make you trip as you walk through a door, such as a raised step or threshold. Trim any bushes or trees on the path to your home. Use bright outdoor lighting. Clear any walking paths of anything that might make someone trip, such as rocks or tools. Regularly check to see if handrails are loose or broken. Make sure that both sides of any steps have handrails. Any raised decks and porches should have guardrails on the edges. Have any leaves, snow, or ice cleared regularly. Use sand or salt on walking paths during winter. Clean up any spills in your garage right away. This includes oil or grease spills. What can I do in the bathroom? Use night lights. Install grab bars by the toilet and in the tub and shower.  Do not use towel bars as grab bars. Use non-skid mats or decals in the tub or shower. If you need to sit down in the shower, use a plastic, non-slip stool. Keep the floor dry. Clean up any water that spills on the floor as soon as it happens. Remove soap buildup in the tub or shower regularly. Attach bath mats securely with double-sided non-slip rug tape. Do not have throw rugs and other things on the floor that can make you trip. What can I do in the bedroom? Use night lights. Make sure that you have a light by your bed that is easy to reach. Do not use any sheets or blankets that are too big for your bed. They should not hang down onto the floor. Have a firm chair that has side arms. You can use this for support while you get dressed. Do not have throw rugs and other things on the floor that can make you trip. What can I do in the kitchen? Clean up any spills right away. Avoid walking on wet floors. Keep items that you use a lot in easy-to-reach places. If you need to reach something above you, use a strong step stool that has a grab bar. Keep electrical cords out of the way. Do not use floor polish or wax that makes floors slippery. If you must use wax, use non-skid floor wax. Do not have throw rugs and other things on the floor that can make you trip. What can I do with my stairs? Do not leave any items on  the stairs. Make sure that there are handrails on both sides of the stairs and use them. Fix handrails that are broken or loose. Make sure that handrails are as long as the stairways. Check any carpeting to make sure that it is firmly attached to the stairs. Fix any carpet that is loose or worn. Avoid having throw rugs at the top or bottom of the stairs. If you do have throw rugs, attach them to the floor with carpet tape. Make sure that you have a light switch at the top of the stairs and the bottom of the stairs. If you do not have them, ask someone to add them for you. What else  can I do to help prevent falls? Wear shoes that: Do not have high heels. Have rubber bottoms. Are comfortable and fit you well. Are closed at the toe. Do not wear sandals. If you use a stepladder: Make sure that it is fully opened. Do not climb a closed stepladder. Make sure that both sides of the stepladder are locked into place. Ask someone to hold it for you, if possible. Clearly mark and make sure that you can see: Any grab bars or handrails. First and last steps. Where the edge of each step is. Use tools that help you move around (mobility aids) if they are needed. These include: Canes. Walkers. Scooters. Crutches. Turn on the lights when you go into a dark area. Replace any light bulbs as soon as they burn out. Set up your furniture so you have a clear path. Avoid moving your furniture around. If any of your floors are uneven, fix them. If there are any pets around you, be aware of where they are. Review your medicines with your doctor. Some medicines can make you feel dizzy. This can increase your chance of falling. Ask your doctor what other things that you can do to help prevent falls. This information is not intended to replace advice given to you by your health care provider. Make sure you discuss any questions you have with your health care provider. Document Released: 05/28/2009 Document Revised: 01/07/2016 Document Reviewed: 09/05/2014 Elsevier Interactive Patient Education  2017 [employer].

## 2021-03-15 DIAGNOSIS — M8008XD Age-related osteoporosis with current pathological fracture, vertebra(e), subsequent encounter for fracture with routine healing: Secondary | ICD-10-CM | POA: Diagnosis not present

## 2021-03-15 DIAGNOSIS — Z932 Ileostomy status: Secondary | ICD-10-CM | POA: Diagnosis not present

## 2021-03-15 DIAGNOSIS — G8929 Other chronic pain: Secondary | ICD-10-CM | POA: Diagnosis not present

## 2021-03-15 DIAGNOSIS — Z9181 History of falling: Secondary | ICD-10-CM | POA: Diagnosis not present

## 2021-03-17 ENCOUNTER — Encounter: Payer: Self-pay | Admitting: Internal Medicine

## 2021-03-17 ENCOUNTER — Ambulatory Visit (INDEPENDENT_AMBULATORY_CARE_PROVIDER_SITE_OTHER): Payer: Medicare Other | Admitting: Internal Medicine

## 2021-03-17 ENCOUNTER — Other Ambulatory Visit: Payer: Self-pay

## 2021-03-17 VITALS — BP 132/72 | HR 62 | Temp 98.0°F | Ht 65.5 in | Wt 166.0 lb

## 2021-03-17 DIAGNOSIS — U071 COVID-19: Secondary | ICD-10-CM

## 2021-03-17 DIAGNOSIS — E538 Deficiency of other specified B group vitamins: Secondary | ICD-10-CM | POA: Diagnosis not present

## 2021-03-17 DIAGNOSIS — E559 Vitamin D deficiency, unspecified: Secondary | ICD-10-CM

## 2021-03-17 DIAGNOSIS — R269 Unspecified abnormalities of gait and mobility: Secondary | ICD-10-CM

## 2021-03-17 DIAGNOSIS — F4321 Adjustment disorder with depressed mood: Secondary | ICD-10-CM

## 2021-03-17 DIAGNOSIS — I10 Essential (primary) hypertension: Secondary | ICD-10-CM | POA: Diagnosis not present

## 2021-03-17 DIAGNOSIS — E785 Hyperlipidemia, unspecified: Secondary | ICD-10-CM | POA: Diagnosis not present

## 2021-03-17 DIAGNOSIS — M810 Age-related osteoporosis without current pathological fracture: Secondary | ICD-10-CM

## 2021-03-17 DIAGNOSIS — E034 Atrophy of thyroid (acquired): Secondary | ICD-10-CM | POA: Diagnosis not present

## 2021-03-17 DIAGNOSIS — R7989 Other specified abnormal findings of blood chemistry: Secondary | ICD-10-CM

## 2021-03-17 LAB — COMPREHENSIVE METABOLIC PANEL
ALT: 16 U/L (ref 0–35)
AST: 24 U/L (ref 0–37)
Albumin: 4.2 g/dL (ref 3.5–5.2)
Alkaline Phosphatase: 64 U/L (ref 39–117)
BUN: 18 mg/dL (ref 6–23)
CO2: 26 mEq/L (ref 19–32)
Calcium: 10.1 mg/dL (ref 8.4–10.5)
Chloride: 96 mEq/L (ref 96–112)
Creatinine, Ser: 0.92 mg/dL (ref 0.40–1.20)
GFR: 57.06 mL/min — ABNORMAL LOW (ref >60.00)
Glucose, Bld: 67 mg/dL — ABNORMAL LOW (ref 70–99)
Potassium: 4.6 mEq/L (ref 3.5–5.1)
Sodium: 131 mEq/L — ABNORMAL LOW (ref 135–145)
Total Bilirubin: 0.5 mg/dL (ref 0.2–1.2)
Total Protein: 6.9 g/dL (ref 6.0–8.3)

## 2021-03-17 LAB — VITAMIN D 25 HYDROXY (VIT D DEFICIENCY, FRACTURES): VITD: 36.12 ng/mL (ref 30.00–100.00)

## 2021-03-17 LAB — TSH: TSH: 9.46 u[IU]/mL — ABNORMAL HIGH (ref 0.35–5.50)

## 2021-03-17 LAB — VITAMIN B12: Vitamin B-12: 500 pg/mL (ref 211–911)

## 2021-03-17 NOTE — Assessment & Plan Note (Signed)
Rollator Walker with Seat In PT

## 2021-03-17 NOTE — Assessment & Plan Note (Signed)
On Vytorin

## 2021-03-17 NOTE — Addendum Note (Signed)
Addended by: Jacobo Forest on: 03/17/2021 10:57 AM   Modules accepted: Orders

## 2021-03-17 NOTE — Patient Instructions (Signed)
Rollator Walker with Seat

## 2021-03-17 NOTE — Progress Notes (Signed)
Subjective:  Patient ID: Jasmine Mccormick, female    DOB: 1933/10/18  Age: 87 y.o. MRN: [id number]  CC: Follow-up (6 month f/u)   HPI KHRISTY KALAN presents for LBP - s/p L3 kyphoplasty with medtronic cement on 01/07/21 - Dr Dawley. Pt is in PT at home. F/u hypothyroidism, HTN  Outpatient Medications Prior to Visit  Medication Sig Dispense Refill   ASPIRIN 81 PO Take 1 tablet by mouth daily.     ezetimibe-simvastatin (VYTORIN) 10-20 MG tablet Take 1 tablet by mouth daily.     metoprolol succinate (TOPROL-XL) 25 MG 24 hr tablet TAKE 1 TABLET BY MOUTH DAILY AT NIGHT 90 tablet 3   Multiple Vitamins-Minerals (MULTIVITAMIN,TX-MINERALS) tablet Take 1 tablet by mouth daily.     SYNTHROID 50 MCG tablet Take 75 mcg a day Mon-Fri and 50 mcg a day on Sat and Sun (Patient taking differently: Take 50 mcg by mouth daily before breakfast.) 90 tablet 3   verapamil (CALAN) 80 MG tablet Take 1 tablet (80 mg total) by mouth 2 (two) times daily. 180 tablet 3   EZETIMIBE-SIMVASTATIN PO Take 1 tablet by mouth at bedtime. (Patient not taking: Reported on 03/17/2021)     lidocaine (LIDODERM) 5 % Place 1 patch onto the skin daily. Remove & Discard patch within 12 hours or as directed by MD (Patient not taking: Reported on 03/17/2021) 30 patch 0   meloxicam (MOBIC) 15 MG tablet Take 15 mg by mouth daily as needed for pain (Back). (Patient not taking: Reported on 03/17/2021)     methocarbamol (ROBAXIN) 500 MG tablet Take 1 tablet (500 mg total) by mouth every 8 (eight) hours as needed for muscle spasms. (Patient not taking: Reported on 03/17/2021) 60 tablet 0   Nirmatrelvir & Ritonavir (PAXLOVID) 20 x 150 MG & 10 x 100MG TBPK Take 3 tablets by mouth in the morning and at bedtime. As directed on a package (Patient not taking: Reported on 03/17/2021) 30 tablet 0   traMADol (ULTRAM) 50 MG tablet Take 1 tablet (50 mg total) by mouth every 6 (six) hours as needed. (Patient not taking: Reported on 03/17/2021) 20 tablet 0   No  facility-administered medications prior to visit.    ROS: Review of Systems  Constitutional:  Positive for fatigue. Negative for activity change, appetite change, chills and unexpected weight change.  HENT:  Negative for congestion, mouth sores and sinus pressure.   Eyes:  Negative for visual disturbance.  Respiratory:  Negative for cough and chest tightness.   Gastrointestinal:  Negative for abdominal pain and nausea.  Genitourinary:  Negative for difficulty urinating, frequency and vaginal pain.  Musculoskeletal:  Positive for back pain and gait problem.  Skin:  Negative for pallor and rash.  Neurological:  Negative for dizziness, tremors, weakness, numbness and headaches.  Psychiatric/Behavioral:  Negative for confusion and sleep disturbance.    Objective:  BP 132/72 (BP Location: Left Arm)   Pulse 62   Temp 98 F (36.7 C) (Oral)   Ht 5' 5.5" (1.664 m)   Wt 166 lb (75.3 kg)   LMP  (LMP Unknown)   SpO2 96%   BMI 27.20 kg/m   BP Readings from Last 3 Encounters:  03/17/21 132/72  01/07/21 (!) 153/68  11/12/20 (!) 148/76    Wt Readings from Last 3 Encounters:  03/17/21 166 lb (75.3 kg)  01/07/21 162 lb 14.7 oz (73.9 kg)  11/12/20 158 lb (71.7 kg)    Physical Exam Constitutional:  General: She is not in acute distress.    Appearance: She is well-developed.  HENT:     Head: Normocephalic.     Right Ear: External ear normal.     Left Ear: External ear normal.     Nose: Nose normal.  Eyes:     General:        Right eye: No discharge.        Left eye: No discharge.     Conjunctiva/sclera: Conjunctivae normal.     Pupils: Pupils are equal, round, and reactive to light.  Neck:     Thyroid: No thyromegaly.     Vascular: No JVD.     Trachea: No tracheal deviation.  Cardiovascular:     Rate and Rhythm: Normal rate and regular rhythm.     Heart sounds: Normal heart sounds.  Pulmonary:     Effort: No respiratory distress.     Breath sounds: No stridor. No  wheezing.  Abdominal:     General: Bowel sounds are normal. There is no distension.     Palpations: Abdomen is soft. There is no mass.     Tenderness: There is no abdominal tenderness. There is no guarding or rebound.  Musculoskeletal:        General: Tenderness present.     Cervical back: Normal range of motion and neck supple. No rigidity.  Lymphadenopathy:     Cervical: No cervical adenopathy.  Skin:    Findings: No erythema or rash.  Neurological:     Cranial Nerves: No cranial nerve deficit.     Motor: Weakness present. No abnormal muscle tone.     Coordination: Coordination abnormal.     Gait: Gait abnormal.     Deep Tendon Reflexes: Reflexes normal.  Psychiatric:        Behavior: Behavior normal.        Thought Content: Thought content normal.        Judgment: Judgment normal.  Cane kyphosis  Lab Results  Component Value Date   WBC 7.3 01/07/2021   HGB 14.1 01/07/2021   HCT 42.3 01/07/2021   PLT 228 01/07/2021   GLUCOSE 108 (H) 01/07/2021   CHOL 191 09/17/2020   TRIG 141.0 09/17/2020   HDL 69.10 09/17/2020   LDLCALC 93 09/17/2020   ALT 16 09/17/2020   AST 23 09/17/2020   NA 131 (L) 01/07/2021   K 4.1 01/07/2021   CL 99 01/07/2021   CREATININE 0.74 01/07/2021   BUN 16 01/07/2021   CO2 23 01/07/2021   TSH 0.72 09/17/2020   HGBA1C 6.2 09/17/2020    DG Lumbar Spine 2-3 Views  Result Date: 01/07/2021 CLINICAL DATA:  L3 kyphoplasty EXAM: LUMBAR SPINE - 2-3 VIEW; DG C-ARM 1-60 MIN COMPARISON:  12/31/2020 FLUOROSCOPY TIME:  Fluoroscopy Time:  1 minutes 36 seconds Radiation Exposure Index (if provided by the fluoroscopic device): 39.85 mGy Number of Acquired Spot Images: 2 FINDINGS: Two spot films were obtained and reveal contrast laden cement within the L3 vertebral body. Stable compression deformity is noted. IMPRESSION: L3 kyphoplasty. Electronically Signed   By: Inez Catalina M.D.   On: 01/07/2021 16:16   DG C-Arm 1-60 Min  Result Date: 01/07/2021 CLINICAL DATA:   L3 kyphoplasty EXAM: LUMBAR SPINE - 2-3 VIEW; DG C-ARM 1-60 MIN COMPARISON:  12/31/2020 FLUOROSCOPY TIME:  Fluoroscopy Time:  1 minutes 36 seconds Radiation Exposure Index (if provided by the fluoroscopic device): 39.85 mGy Number of Acquired Spot Images: 2 FINDINGS: Two spot films were obtained and reveal  contrast laden cement within the L3 vertebral body. Stable compression deformity is noted. IMPRESSION: L3 kyphoplasty. Electronically Signed   By: Inez Catalina M.D.   On: 01/07/2021 16:16    Assessment & Plan:     Walker Kehr, MD

## 2021-03-17 NOTE — Assessment & Plan Note (Signed)
Cont w/Metoprolol, Verapamil, Aceon

## 2021-03-17 NOTE — Assessment & Plan Note (Signed)
Recovered  

## 2021-03-17 NOTE — Assessment & Plan Note (Signed)
Check B12 On MVI qd

## 2021-03-17 NOTE — Assessment & Plan Note (Signed)
On Levothroid

## 2021-03-17 NOTE — Assessment & Plan Note (Addendum)
Husband died in 6/22. Discussed

## 2021-03-17 NOTE — Assessment & Plan Note (Signed)
Pt declined Rx in the past Will start Evenity

## 2021-03-18 DIAGNOSIS — Z9181 History of falling: Secondary | ICD-10-CM | POA: Diagnosis not present

## 2021-03-18 DIAGNOSIS — M8008XD Age-related osteoporosis with current pathological fracture, vertebra(e), subsequent encounter for fracture with routine healing: Secondary | ICD-10-CM | POA: Diagnosis not present

## 2021-03-18 DIAGNOSIS — Z932 Ileostomy status: Secondary | ICD-10-CM | POA: Diagnosis not present

## 2021-03-18 DIAGNOSIS — G8929 Other chronic pain: Secondary | ICD-10-CM | POA: Diagnosis not present

## 2021-03-19 ENCOUNTER — Ambulatory Visit (INDEPENDENT_AMBULATORY_CARE_PROVIDER_SITE_OTHER): Payer: Medicare Other | Admitting: Podiatry

## 2021-03-19 ENCOUNTER — Other Ambulatory Visit: Payer: Self-pay

## 2021-03-19 DIAGNOSIS — L603 Nail dystrophy: Secondary | ICD-10-CM | POA: Diagnosis not present

## 2021-03-19 DIAGNOSIS — L600 Ingrowing nail: Secondary | ICD-10-CM | POA: Diagnosis not present

## 2021-03-20 ENCOUNTER — Other Ambulatory Visit: Payer: Self-pay | Admitting: Internal Medicine

## 2021-03-22 ENCOUNTER — Telehealth: Payer: Self-pay | Admitting: *Deleted

## 2021-03-22 DIAGNOSIS — Z932 Ileostomy status: Secondary | ICD-10-CM | POA: Diagnosis not present

## 2021-03-22 DIAGNOSIS — Z9181 History of falling: Secondary | ICD-10-CM | POA: Diagnosis not present

## 2021-03-22 DIAGNOSIS — G8929 Other chronic pain: Secondary | ICD-10-CM | POA: Diagnosis not present

## 2021-03-22 DIAGNOSIS — M8008XD Age-related osteoporosis with current pathological fracture, vertebra(e), subsequent encounter for fracture with routine healing: Secondary | ICD-10-CM | POA: Diagnosis not present

## 2021-03-22 NOTE — Telephone Encounter (Signed)
[employer] called and stated the T4 add on they cannot complete; got the request after the 48 hours.Marland KitchenJohny Chess

## 2021-03-23 ENCOUNTER — Encounter: Payer: Self-pay | Admitting: Podiatry

## 2021-03-23 NOTE — Telephone Encounter (Signed)
Noted. Thanks.

## 2021-03-23 NOTE — Progress Notes (Signed)
Subjective:  Patient ID: Jasmine Mccormick, female    DOB: 1933-12-18,  MRN: [id number]  Chief Complaint  Patient presents with   Ingrown Toenail    Right hallux     87 y.o. female presents with the above complaint.  Patient presents with complaint of right medial border ingrown.  Patient states painful to touch the disorder tender.  Patient would like to have it removed.  She is not a diabetic.  She states is painful with ambulation pain with shoe rubbing on his.  She has not tried anything for it.  She is tried some self debridement none of which has helped.  She would like to remove it.   Review of Systems: Negative except as noted in the HPI. Denies N/V/F/Ch.  Past Medical History:  Diagnosis Date   COVID 12/18/2020   mild   HTN (hypertension)    Normal at home   Hyperlipidemia    Hypothyroidism    Ileostomy in place [facility])    Memory loss    mild but not dx by doctor per [facility]   Meniere disease 1996   Osteoporosis    UC (ulcerative colitis) ([place])     Current Outpatient Medications:    ASPIRIN 81 PO, Take 1 tablet by mouth daily., Disp: , Rfl:    ezetimibe-simvastatin (VYTORIN) 10-20 MG tablet, Take 1 tablet by mouth daily., Disp: , Rfl:    metoprolol succinate (TOPROL-XL) 25 MG 24 hr tablet, TAKE 1 TABLET BY MOUTH AT BEDTIME, Disp: 90 tablet, Rfl: 3   Multiple Vitamins-Minerals (MULTIVITAMIN,TX-MINERALS) tablet, Take 1 tablet by mouth daily., Disp: , Rfl:    SYNTHROID 50 MCG tablet, Take 75 mcg a day Mon-Fri and 50 mcg a day on Sat and Sun (Patient taking differently: Take 50 mcg by mouth daily before breakfast.), Disp: 90 tablet, Rfl: 3   verapamil (CALAN) 80 MG tablet, Take 1 tablet (80 mg total) by mouth 2 (two) times daily., Disp: 180 tablet, Rfl: 3  Social History   Tobacco Use  Smoking Status Never  Smokeless Tobacco Never    No Known Allergies Objective:  There were no vitals filed for this visit. There is no height or weight on file to calculate  BMI. Constitutional Well developed. Well nourished.  Vascular Dorsalis pedis pulses palpable bilaterally. Posterior tibial pulses palpable bilaterally. Capillary refill normal to all digits.  No cyanosis or clubbing noted. Pedal hair growth normal.  Neurologic Normal speech. Oriented to person, place, and time. Epicritic sensation to light touch grossly present bilaterally.  Dermatologic Painful ingrowing nail at medial nail borders of the hallux nail right. No other open wounds. No skin lesions.  Orthopedic: Normal joint ROM without pain or crepitus bilaterally. No visible deformities. No bony tenderness.   Radiographs: None Assessment:   1. Ingrown toenail of right foot   2. Onychodystrophy    Plan:  Patient was evaluated and treated and all questions answered.  Ingrown Nail, right with underlying nail dystrophy/onychodystrophy -Patient elects to proceed with minor surgery to remove ingrown toenail removal today. Consent reviewed and signed by patient. -Ingrown nail excised. See procedure note. -Educated on post-procedure care including soaking. Written instructions provided and reviewed. -Patient to follow up in 2 weeks for nail check.  Procedure: Excision of Ingrown Toenail Location: Right 1st toe medial nail borders. Anesthesia: Lidocaine 1% plain; 1.5 mL and Marcaine 0.5% plain; 1.5 mL, digital block. Skin Prep: Betadine. Dressing: Silvadene; telfa; dry, sterile, compression dressing. Technique: Following skin prep, the toe was exsanguinated  and a tourniquet was secured at the base of the toe. The affected nail border was freed, split with a nail splitter, and excised. Chemical matrixectomy was then performed with phenol and irrigated out with alcohol. The tourniquet was then removed and sterile dressing applied. Disposition: Patient tolerated procedure well. Patient to return in 2 weeks for follow-up.   No follow-ups on file.

## 2021-03-24 DIAGNOSIS — Z9181 History of falling: Secondary | ICD-10-CM | POA: Diagnosis not present

## 2021-03-24 DIAGNOSIS — Z932 Ileostomy status: Secondary | ICD-10-CM | POA: Diagnosis not present

## 2021-03-24 DIAGNOSIS — M8008XD Age-related osteoporosis with current pathological fracture, vertebra(e), subsequent encounter for fracture with routine healing: Secondary | ICD-10-CM | POA: Diagnosis not present

## 2021-03-24 DIAGNOSIS — G8929 Other chronic pain: Secondary | ICD-10-CM | POA: Diagnosis not present

## 2021-03-25 ENCOUNTER — Telehealth: Payer: Self-pay | Admitting: Internal Medicine

## 2021-03-25 NOTE — Telephone Encounter (Signed)
Evenity [id number] Submitted to [employer] portal for verification 8.11.22 ID: [id number] Insurance: Medicare A/B and [place] F

## 2021-03-28 ENCOUNTER — Other Ambulatory Visit: Payer: Self-pay

## 2021-03-28 ENCOUNTER — Inpatient Hospital Stay (HOSPITAL_COMMUNITY)
Admission: EM | Admit: 2021-03-28 | Discharge: 2021-04-06 | DRG: 356 | Disposition: A | Discharge status: Skilled Nursing Facility | Payer: Medicare Other | Attending: Surgery | Admitting: Surgery

## 2021-03-28 ENCOUNTER — Emergency Department (HOSPITAL_COMMUNITY): Payer: Medicare Other

## 2021-03-28 DIAGNOSIS — I9581 Postprocedural hypotension: Secondary | ICD-10-CM | POA: Diagnosis not present

## 2021-03-28 DIAGNOSIS — Z20822 Contact with and (suspected) exposure to covid-19: Secondary | ICD-10-CM | POA: Diagnosis present

## 2021-03-28 DIAGNOSIS — R110 Nausea: Secondary | ICD-10-CM | POA: Diagnosis not present

## 2021-03-28 DIAGNOSIS — M810 Age-related osteoporosis without current pathological fracture: Secondary | ICD-10-CM | POA: Diagnosis present

## 2021-03-28 DIAGNOSIS — Z933 Colostomy status: Secondary | ICD-10-CM

## 2021-03-28 DIAGNOSIS — J95821 Acute postprocedural respiratory failure: Secondary | ICD-10-CM | POA: Diagnosis not present

## 2021-03-28 DIAGNOSIS — R109 Unspecified abdominal pain: Secondary | ICD-10-CM | POA: Diagnosis not present

## 2021-03-28 DIAGNOSIS — K55069 Acute infarction of intestine, part and extent unspecified: Secondary | ICD-10-CM | POA: Diagnosis present

## 2021-03-28 DIAGNOSIS — N200 Calculus of kidney: Secondary | ICD-10-CM | POA: Diagnosis not present

## 2021-03-28 DIAGNOSIS — Z8616 Personal history of COVID-19: Secondary | ICD-10-CM

## 2021-03-28 DIAGNOSIS — Z932 Ileostomy status: Secondary | ICD-10-CM

## 2021-03-28 DIAGNOSIS — R739 Hyperglycemia, unspecified: Secondary | ICD-10-CM | POA: Diagnosis not present

## 2021-03-28 DIAGNOSIS — E039 Hypothyroidism, unspecified: Secondary | ICD-10-CM | POA: Diagnosis present

## 2021-03-28 DIAGNOSIS — Z9049 Acquired absence of other specified parts of digestive tract: Secondary | ICD-10-CM

## 2021-03-28 DIAGNOSIS — R079 Chest pain, unspecified: Secondary | ICD-10-CM | POA: Diagnosis not present

## 2021-03-28 DIAGNOSIS — R000 Tachycardia, unspecified: Secondary | ICD-10-CM | POA: Diagnosis present

## 2021-03-28 DIAGNOSIS — Z79890 Hormone replacement therapy (postmenopausal): Secondary | ICD-10-CM

## 2021-03-28 DIAGNOSIS — E872 Acidosis: Secondary | ICD-10-CM | POA: Diagnosis not present

## 2021-03-28 DIAGNOSIS — I4819 Other persistent atrial fibrillation: Secondary | ICD-10-CM

## 2021-03-28 DIAGNOSIS — Z8249 Family history of ischemic heart disease and other diseases of the circulatory system: Secondary | ICD-10-CM

## 2021-03-28 DIAGNOSIS — R1084 Generalized abdominal pain: Secondary | ICD-10-CM | POA: Diagnosis not present

## 2021-03-28 DIAGNOSIS — N179 Acute kidney failure, unspecified: Secondary | ICD-10-CM | POA: Diagnosis not present

## 2021-03-28 DIAGNOSIS — Z9114 Patient's other noncompliance with medication regimen: Secondary | ICD-10-CM

## 2021-03-28 DIAGNOSIS — K449 Diaphragmatic hernia without obstruction or gangrene: Secondary | ICD-10-CM | POA: Diagnosis not present

## 2021-03-28 DIAGNOSIS — I10 Essential (primary) hypertension: Secondary | ICD-10-CM | POA: Diagnosis not present

## 2021-03-28 DIAGNOSIS — E782 Mixed hyperlipidemia: Secondary | ICD-10-CM | POA: Diagnosis present

## 2021-03-28 DIAGNOSIS — Z4659 Encounter for fitting and adjustment of other gastrointestinal appliance and device: Secondary | ICD-10-CM | POA: Diagnosis not present

## 2021-03-28 DIAGNOSIS — Z7982 Long term (current) use of aspirin: Secondary | ICD-10-CM

## 2021-03-28 DIAGNOSIS — J90 Pleural effusion, not elsewhere classified: Secondary | ICD-10-CM | POA: Diagnosis not present

## 2021-03-28 DIAGNOSIS — D72829 Elevated white blood cell count, unspecified: Secondary | ICD-10-CM | POA: Diagnosis not present

## 2021-03-28 DIAGNOSIS — Y838 Other surgical procedures as the cause of abnormal reaction of the patient, or of later complication, without mention of misadventure at the time of the procedure: Secondary | ICD-10-CM | POA: Diagnosis not present

## 2021-03-28 DIAGNOSIS — K5190 Ulcerative colitis, unspecified, without complications: Secondary | ICD-10-CM | POA: Diagnosis present

## 2021-03-28 DIAGNOSIS — I97191 Other postprocedural cardiac functional disturbances following other surgery: Secondary | ICD-10-CM | POA: Diagnosis not present

## 2021-03-28 DIAGNOSIS — R0902 Hypoxemia: Secondary | ICD-10-CM | POA: Diagnosis not present

## 2021-03-28 DIAGNOSIS — K922 Gastrointestinal hemorrhage, unspecified: Secondary | ICD-10-CM | POA: Diagnosis present

## 2021-03-28 DIAGNOSIS — R6510 Systemic inflammatory response syndrome (SIRS) of non-infectious origin without acute organ dysfunction: Secondary | ICD-10-CM | POA: Diagnosis present

## 2021-03-28 DIAGNOSIS — R413 Other amnesia: Secondary | ICD-10-CM | POA: Diagnosis present

## 2021-03-28 DIAGNOSIS — I160 Hypertensive urgency: Secondary | ICD-10-CM | POA: Diagnosis present

## 2021-03-28 DIAGNOSIS — Z803 Family history of malignant neoplasm of breast: Secondary | ICD-10-CM

## 2021-03-28 DIAGNOSIS — K55059 Acute (reversible) ischemia of intestine, part and extent unspecified: Secondary | ICD-10-CM | POA: Diagnosis present

## 2021-03-28 DIAGNOSIS — H8109 Meniere's disease, unspecified ear: Secondary | ICD-10-CM | POA: Diagnosis present

## 2021-03-28 DIAGNOSIS — E876 Hypokalemia: Secondary | ICD-10-CM | POA: Diagnosis not present

## 2021-03-28 LAB — I-STAT CHEM 8, ED
BUN: 23 mg/dL (ref 8–23)
Calcium, Ion: 1.24 mmol/L (ref 1.15–1.40)
Chloride: 101 mmol/L (ref 98–111)
Creatinine, Ser: 0.8 mg/dL (ref 0.44–1.00)
Glucose, Bld: 151 mg/dL — ABNORMAL HIGH (ref 70–99)
HCT: 43 % (ref 36.0–46.0)
Hemoglobin: 14.6 g/dL (ref 12.0–15.0)
Potassium: 4.4 mmol/L (ref 3.5–5.1)
Sodium: 135 mmol/L (ref 135–145)
TCO2: 26 mmol/L (ref 22–32)

## 2021-03-28 MED ORDER — FENTANYL CITRATE (PF) 100 MCG/2ML IJ SOLN
50.0000 ug | Freq: Once | INTRAMUSCULAR | Status: AC
Start: 2021-03-29 — End: 2021-03-29
  Administered 2021-03-29: 50 ug via INTRAVENOUS
  Filled 2021-03-28: qty 2

## 2021-03-28 MED ORDER — ONDANSETRON HCL 4 MG/2ML IJ SOLN
4.0000 mg | Freq: Once | INTRAMUSCULAR | Status: AC
  Administered 2021-03-28: 4 mg via INTRAVENOUS
  Filled 2021-03-28: qty 2

## 2021-03-28 MED ORDER — PANTOPRAZOLE 80MG IVPB - SIMPLE MED
80.0000 mg | Freq: Once | INTRAVENOUS | Status: AC
  Administered 2021-03-29: 80 mg via INTRAVENOUS
  Filled 2021-03-28: qty 80

## 2021-03-28 MED ORDER — PANTOPRAZOLE SODIUM 40 MG IV SOLR: 40.0000 mg | Freq: Two times a day (BID) | INTRAVENOUS | Status: DC

## 2021-03-28 MED ORDER — PANTOPRAZOLE INFUSION (NEW) - SIMPLE MED
8.0000 mg/h | INTRAVENOUS | Status: DC
  Administered 2021-03-29: 8 mg/h via INTRAVENOUS
  Filled 2021-03-28: qty 100
  Filled 2021-03-28: qty 80

## 2021-03-28 NOTE — ED Provider Notes (Signed)
[facility] DEPT Provider Note   CSN: [id number] Arrival date & time: 03/28/21  2213     History Chief Complaint  Patient presents with   Abdominal Pain    Jasmine Mccormick is an 87 y.o. female with a PMH significant for HTN, ulcerative colitis with a surgical history of total colectomy and ileostomy with a stoma in the right abdomen who presents with 2 hours of acute abdominal pain and new bleeding from the stoma. Patient reports her abdominal pain is sharp in nature, began 30-45 minutes after eating, and that there is some radiation into the chest. The chest pain worsens with inspiration or with palpation of the abdomen. The patient denies pressure-like chest pain, reports there is no association with exertion, and denies radiation into either arm or leg. The patient denies new difficulty with speech, tingling, numbness, weakness, visual disturbances. Patient denies fever, chills, recent illness. Patient endorses nausea without vomiting. Patient denies quality change in products of stoma prior to this evening. Patient denies dysuria.   Abdominal Pain Associated symptoms: nausea   Associated symptoms: no chest pain, no chills, no fever, no shortness of breath and no vomiting       Past Medical History:  Diagnosis Date   COVID 12/18/2020   mild   HTN (hypertension)    Normal at home   Hyperlipidemia    Hypothyroidism    Ileostomy in place [facility])    Memory loss    mild but not dx by doctor per Neice   Meniere disease 1996   Osteoporosis    UC (ulcerative colitis) ([place])     Patient Active Problem List   Diagnosis Date Noted   Grief 03/17/2021   Gait disorder 03/17/2021   Fracture of third lumbar vertebra ([place]) 02/16/2021   UTI (urinary tract infection) 02/01/2021   COVID-19 12/22/2020   Body mass index (BMI) 26.0-26.9, adult 11/24/2020   Elevated blood-pressure reading, without diagnosis of hypertension 11/24/2020   Acute bilateral low back  pain with left-sided sciatica 11/12/2020   Thigh shingles 04/02/2020   Acute right hip pain 04/02/2020   Dysuria 08/18/2017   Hearing loss of both ears due to cerumen impaction 09/03/2015   Callus of foot 09/03/2015   Low vitamin B12 level 09/03/2015   Right-sided face pain 08/21/2014   Well adult exam 07/04/2012   Hyperglycemia 07/04/2012   Hand dermatitis 06/22/2011   Hypothyroidism 12/07/2010   Actinic keratosis 05/04/2010   NEOPLASM, SKIN, UNCERTAIN BEHAVIOR 51/70/0174   CERUMEN IMPACTION 09/26/2007   RASH-NONVESICULAR 06/12/2007   Dyslipidemia 03/19/2007   MENIERE'S DISEASE 03/19/2007   Essential hypertension 03/19/2007   Ulcerative colitis ([place]) 03/19/2007   Osteoporosis 03/19/2007    Past Surgical History:  Procedure Laterality Date   COLECTOMY     COLONOSCOPY     Ear Shunt for Menier's     ILEOSTOMY     KYPHOPLASTY N/A 01/07/2021   Procedure: Lumbar three Kyphoplasty;  Surgeon: Dawley, Theodoro Doing, DO;  Location: [facility];  Service: Neurosurgery;  Laterality: N/A;   PROCTECTOMY       OB History   No obstetric history on file.     Family History  Problem Relation Age of Onset   Hypertension Other    Heart disease Other    Breast cancer Sister     Social History   Tobacco Use   Smoking status: Never   Smokeless tobacco: Never  Vaping Use   Vaping Use: Never used  Substance Use Topics  Alcohol use: No   Drug use: No    Home Medications Prior to Admission medications   Medication Sig Start Date End Date Taking? Authorizing Provider  ASPIRIN 81 PO Take 1 tablet by mouth daily.    [provider]  ezetimibe-simvastatin (VYTORIN) 10-20 MG tablet Take 1 tablet by mouth daily.    [provider]  metoprolol succinate (TOPROL-XL) 25 MG 24 hr tablet TAKE 1 TABLET BY MOUTH AT BEDTIME 03/22/21   Plotnikov, Evie Lacks, MD  Multiple Vitamins-Minerals (MULTIVITAMIN,TX-MINERALS) tablet Take 1 tablet by mouth daily.    [provider]  SYNTHROID  50 MCG tablet Take 75 mcg a day Mon-Fri and 50 mcg a day on Sat and Sun Patient taking differently: Take 50 mcg by mouth daily before breakfast. 12/10/20   Plotnikov, Evie Lacks, MD  verapamil (CALAN) 80 MG tablet Take 1 tablet (80 mg total) by mouth 2 (two) times daily. 02/26/20   Plotnikov, Evie Lacks, MD    Allergies    Patient has no known allergies.  Review of Systems   Review of Systems  Constitutional:  Negative for chills and fever.  Eyes:  Negative for visual disturbance.  Respiratory:  Negative for chest tightness and shortness of breath.   Cardiovascular:  Negative for chest pain and palpitations.  Gastrointestinal:  Positive for abdominal pain, blood in stool and nausea. Negative for vomiting.  Genitourinary:  Negative for difficulty urinating.  Musculoskeletal:  Negative for gait problem.  Skin:  Negative for color change and rash.  Neurological:  Negative for tremors, speech difficulty, weakness, numbness and headaches.  All other systems reviewed and are negative.  Physical Exam Updated Vital Signs BP (!) 202/92   Pulse 75   Temp 97.7 F (36.5 C) (Oral)   Resp 13   Ht 5' 7"  (1.702 m)   Wt 68 kg   LMP  (LMP Unknown)   SpO2 97%   BMI 23.49 kg/m   Physical Exam Vitals and nursing note reviewed.  Constitutional:      Appearance: She is well-developed. She is ill-appearing. She is not diaphoretic.  HENT:     Head: Normocephalic and atraumatic.     Nose: No congestion or rhinorrhea.     Mouth/Throat:     Mouth: Mucous membranes are moist.     Pharynx: No oropharyngeal exudate.  Eyes:     General: No scleral icterus.    Extraocular Movements: Extraocular movements intact.     Pupils: Pupils are equal, round, and reactive to light.  Cardiovascular:     Rate and Rhythm: Normal rate and regular rhythm.     Heart sounds: No murmur heard.   No friction rub. No gallop.  Pulmonary:     Effort: Pulmonary effort is normal. No respiratory distress.     Breath sounds:  Normal breath sounds.  Abdominal:     Comments: Bowel sounds in all 4 quadrants. Stoma with bag in right upper quadrant producing mixed feces and blood during examination. Well healed evidence of prior surgical port without erythema. Transitioning to an increasing proportion of blood. Patient is tender to palpation of upper quadrants with most focal tenderness periumbilically. No masses palpated. No bruits auscultated.  Skin:    General: Skin is warm and dry.  Neurological:     General: No focal deficit present.     Mental Status: She is alert and oriented to person, place, and time.     Cranial Nerves: No cranial nerve deficit.  Motor: No weakness.  Psychiatric:        Mood and Affect: Mood is anxious.    ED Results / Procedures / Treatments   Labs (all labs ordered are listed, but only abnormal results are displayed) Labs Reviewed  CBC WITH DIFFERENTIAL/PLATELET - Abnormal; Notable for the following components:      Result Value   WBC 12.5 (*)    Neutro Abs 10.2 (*)    All other components within normal limits  COMPREHENSIVE METABOLIC PANEL - Abnormal; Notable for the following components:   Glucose, Bld 146 (*)    All other components within normal limits  I-STAT CHEM 8, ED - Abnormal; Notable for the following components:   Glucose, Bld 151 (*)    All other components within normal limits  I-STAT BETA HCG BLOOD, ED ([facility], [facility], [facility] ONLY) - Abnormal; Notable for the following components:   I-stat hCG, quantitative 6.5 (*)    All other components within normal limits  LIPASE, BLOOD  PROTIME-INR  APTT  LACTIC ACID, PLASMA  TYPE AND SCREEN  ABO/RH  TROPONIN I (HIGH SENSITIVITY)  TROPONIN I (HIGH SENSITIVITY)    EKG EKG Interpretation  Date/Time:  Sunday March 28 2021 23:10:31 EDT Ventricular Rate:  68 PR Interval:  164 QRS Duration: 113 QT Interval:  450 QTC Calculation: 479 R Axis:   80 Text Interpretation: Sinus rhythm Borderline intraventricular conduction delay  Confirmed by Orpah Greek [phone]) on 03/28/2021 11:18:54 PM  Radiology DG Chest Portable 1 View  Result Date: 03/28/2021 CLINICAL DATA:  Pleuritic chest pain. EXAM: PORTABLE CHEST 1 VIEW COMPARISON:  None. FINDINGS: Significant patient rotation limits assessment. Heart size is grossly normal. There is aortic atherosclerosis. Borderline hyperinflation with minimal bronchial thickening. No confluent airspace disease. Limited assessment of the left lung base due to overlapping soft tissue structures. No pneumothorax. Limited assessment for pleural effusion due to positioning. No pulmonary edema. Degenerative change of both shoulders. No acute osseous abnormalities are seen. IMPRESSION: Significant patient rotation limits assessment. Borderline hyperinflation and minimal bronchial thickening. Electronically Signed   By: Keith Rake M.D.   On: 03/28/2021 23:54   CT Angio Abd/Pel W and/or Wo Contrast  Result Date: 03/29/2021 CLINICAL DATA:  Abdominal pain radiating to the chest. EXAM: CTA ABDOMEN AND PELVIS WITHOUT AND WITH CONTRAST TECHNIQUE: Multidetector CT imaging of the abdomen and pelvis was performed using the standard protocol during bolus administration of intravenous contrast. Multiplanar reconstructed images and MIPs were obtained and reviewed to evaluate the vascular anatomy. CONTRAST:  80m OMNIPAQUE IOHEXOL 350 MG/ML SOLN COMPARISON:  None. FINDINGS: VASCULAR Aorta: Moderate to marked severity calcification of a caliber aorta without aneurysm, dissection, vasculitis or significant stenosis. Celiac: Patent without evidence of aneurysm, dissection, vasculitis or significant stenosis. SMA: Occlusion of the distal aspect of the superior mesenteric artery is seen (axial CT images 62 through 73, CT series 6). Enhancement of 1 of its distal branches is present. Renals: Both renal arteries are patent without evidence of aneurysm, dissection, vasculitis, fibromuscular dysplasia or significant  stenosis. IMA: Not clearly visualized. Inflow: Moderate to marked severity diffuse calcification without evidence of aneurysm, dissection, vasculitis or significant stenosis. Proximal Outflow: Bilateral common femoral and visualized portions of the superficial and profunda femoral arteries are patent without evidence of aneurysm, dissection, vasculitis or significant stenosis. Veins: No obvious venous abnormality within the limitations of this arterial phase study. Review of the MIP images confirms the above findings. NON-VASCULAR Lower chest: Mild to moderate severity scarring and/or atelectasis is  seen along the posterior aspect of the bilateral lung bases. Hepatobiliary: A punctate calcified granuloma is seen within the anterior aspect of the right lobe of the liver. The gallbladder is not clearly identified and may be partially contracted. Pancreas: Unremarkable. No pancreatic ductal dilatation or surrounding inflammatory changes. Spleen: Normal in size without focal abnormality. Adrenals/Urinary Tract: Adrenal glands are unremarkable. Kidneys are normal in size, without obstructing renal calculi or hydronephrosis. Cortical scarring is seen along the lateral aspect of the mid left kidney. An adjacent 1.7 cm diameter cystic appearing area is noted. A 2 mm nonobstructing renal stone is seen within the mid to upper left kidney. Bladder is unremarkable. Stomach/Bowel: There is a large hiatal hernia. Appendix appears normal. No evidence of bowel wall thickening, distention, or inflammatory changes. Lymphatic: No abnormal abdominal or pelvic lymph nodes are identified. Reproductive: Uterus and bilateral adnexa are unremarkable. Other: A right lower quadrant ostomy site is seen. No abdominopelvic ascites. Musculoskeletal: There is evidence of prior vertebroplasty at the level of L3. Marked severity chronic and degenerative changes are also seen at the level of L2-L3. IMPRESSION: VASCULAR 1. 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 2. Moderate to marked severity diffuse calcification and atherosclerosis throughout the remaining arterial structures within the abdomen and pelvis. NON-VASCULAR 1. 2 mm nonobstructing left renal stone. 2. Postoperative changes with a subsequent right lower quadrant ostomy site. 3. Large hiatal hernia. Electronically Signed   By: Virgina Norfolk M.D.   On: 03/29/2021 01:35    Procedures .Critical Care  Date/Time: 03/29/2021 5:09 AM Performed by: Anselmo Pickler, PA-C Authorized by: Anselmo Pickler, PA-C   Critical care provider statement:    Critical care time (minutes):  40   Critical care was necessary to treat or prevent imminent or life-threatening deterioration of the following conditions:  Circulatory failure   Critical care was time spent personally by me on the following activities:  Discussions with consultants, evaluation of patient's response to treatment, examination of patient, ordering and performing treatments and interventions, ordering and review of laboratory studies, ordering and review of radiographic studies, pulse oximetry, re-evaluation of patient's condition, obtaining history from patient or surrogate and review of old charts   Care discussed with: admitting provider and accepting provider at another facility     Medications Ordered in ED Medications  pantoprozole (PROTONIX) 80 mg /NS 100 mL infusion (8 mg/hr Intravenous New Bag/Given 03/29/21 0008)  pantoprazole (PROTONIX) injection 40 mg (has no administration in time range)  ondansetron (ZOFRAN) injection 4 mg (4 mg Intravenous Given 03/28/21 2314)  pantoprazole (PROTONIX) 80 mg /NS 100 mL IVPB (0 mg Intravenous Stopped 03/29/21 0119)  fentaNYL (SUBLIMAZE) injection 50 mcg (50 mcg Intravenous Given 03/29/21 0011)  iohexol (OMNIPAQUE) 350 MG/ML injection 80 mL (80 mLs Intravenous Contrast Given 03/29/21 0036)  ondansetron (ZOFRAN) injection 4 mg  (4 mg Intravenous Given 03/29/21 0119)  fentaNYL (SUBLIMAZE) injection 50 mcg (50 mcg Intravenous Given 03/29/21 0132)    ED Course  I have reviewed the triage vital signs and the nursing notes.  Pertinent labs & imaging results that were available during my care of the patient were reviewed by me and considered in my medical decision making (see chart for details).  Clinical Course as of 03/29/21 0524  Mon Mar 29, 2021  0136 WBC(!): 12.5 Elevated white count with left shift suggests possible infectious process [CP]  0137 Lactic Acid, Venous: 1.3 Normal first lactic reassuring, will trend [CP]  0138  Hemoglobin: 13.4 Normal baseline hemoglobin suggests new acute bleeding [CP]    Clinical Course User Index [CP] Tesia Lybrand H, PA-C   2:01 AM Reassessed patient and discussed plan to admit and consult GI. Patient with ongoing nausea and 2-3 episodes of vomiting. Another dose of Zofran administered at this time.   MDM Rules/Calculators/A&P                         CRITICAL CARE Performed by: Anselmo Pickler   Evidence of active upper GI bleed based on contents of stomal bag. Initiated IV protonix. Elevated white count with neutrophil predominance suggests potential infectious process. Baseline hemoglobin 13.4 is reassuring. Continue to trend. Initial lactic acid 1.3 reassuring for lower chance of acute mesenteric ischemic event. CTA shows:   VASCULAR 1. 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 2. Moderate to marked severity diffuse calcification and atherosclerosis throughout the remaining arterial structures within the abdomen and pelvis. NON-VASCULAR 1. 2 mm nonobstructing left renal stone. 2. Postoperative changes with a subsequent right lower quadrant ostomy site. 3. Large hiatal hernia.  Discussion with radiologist who favor chronic occlusive changes evidence by a reconstitution of flow in distal superior mesenteric artery.  Consult placed to GI and hospitalist for admit given ongoing stomal bleeding, abdominal pain, and hypertension.  Patient continuing to have exquisite abdominal pain.  4:37 AM Repeat lactic acid 8.4. Extremely concerning for acute mesenteric ischemia at this time. Patient continuing to have significant pain. Consult placed to vascular surgery. Patient transferred to [facility] with plans to go to OR.  Final diagnoses:  None    Rx / DC Orders ED Discharge Orders     None        Anselmo Pickler, PA-C 03/29/21 0525    Orpah Greek, MD 03/29/21 [phone]

## 2021-03-28 NOTE — ED Notes (Signed)
Patient has a colostomy bag. Stool from bag is dark red, blood tinged. Bag was changed at providers request. Patient also complains of chest pain

## 2021-03-28 NOTE — ED Triage Notes (Signed)
Patient is from home and complains of abdominal pain that radiates to the chest, mid sternum.

## 2021-03-28 NOTE — ED Notes (Signed)
X-ray at bedside

## 2021-03-29 ENCOUNTER — Inpatient Hospital Stay (HOSPITAL_COMMUNITY): Payer: Medicare Other | Admitting: Anesthesiology

## 2021-03-29 ENCOUNTER — Inpatient Hospital Stay (HOSPITAL_COMMUNITY): Payer: Medicare Other

## 2021-03-29 ENCOUNTER — Emergency Department (HOSPITAL_COMMUNITY): Payer: Medicare Other

## 2021-03-29 ENCOUNTER — Encounter (HOSPITAL_COMMUNITY)
Admission: EM | Disposition: A | Discharge status: Skilled Nursing Facility | Payer: Self-pay | Source: Home / Self Care | Attending: Surgery

## 2021-03-29 ENCOUNTER — Encounter (HOSPITAL_COMMUNITY): Payer: Self-pay

## 2021-03-29 DIAGNOSIS — Z8249 Family history of ischemic heart disease and other diseases of the circulatory system: Secondary | ICD-10-CM | POA: Diagnosis not present

## 2021-03-29 DIAGNOSIS — J95821 Acute postprocedural respiratory failure: Secondary | ICD-10-CM | POA: Diagnosis not present

## 2021-03-29 DIAGNOSIS — E7800 Pure hypercholesterolemia, unspecified: Secondary | ICD-10-CM | POA: Diagnosis not present

## 2021-03-29 DIAGNOSIS — Z8616 Personal history of COVID-19: Secondary | ICD-10-CM | POA: Diagnosis not present

## 2021-03-29 DIAGNOSIS — K55059 Acute (reversible) ischemia of intestine, part and extent unspecified: Secondary | ICD-10-CM | POA: Diagnosis present

## 2021-03-29 DIAGNOSIS — K922 Gastrointestinal hemorrhage, unspecified: Secondary | ICD-10-CM | POA: Diagnosis present

## 2021-03-29 DIAGNOSIS — I480 Paroxysmal atrial fibrillation: Secondary | ICD-10-CM | POA: Diagnosis not present

## 2021-03-29 DIAGNOSIS — R6510 Systemic inflammatory response syndrome (SIRS) of non-infectious origin without acute organ dysfunction: Secondary | ICD-10-CM | POA: Diagnosis not present

## 2021-03-29 DIAGNOSIS — Z8781 Personal history of (healed) traumatic fracture: Secondary | ICD-10-CM | POA: Diagnosis not present

## 2021-03-29 DIAGNOSIS — R112 Nausea with vomiting, unspecified: Secondary | ICD-10-CM | POA: Diagnosis not present

## 2021-03-29 DIAGNOSIS — Z9049 Acquired absence of other specified parts of digestive tract: Secondary | ICD-10-CM | POA: Diagnosis not present

## 2021-03-29 DIAGNOSIS — Z932 Ileostomy status: Secondary | ICD-10-CM | POA: Diagnosis not present

## 2021-03-29 DIAGNOSIS — Y838 Other surgical procedures as the cause of abnormal reaction of the patient, or of later complication, without mention of misadventure at the time of the procedure: Secondary | ICD-10-CM | POA: Diagnosis not present

## 2021-03-29 DIAGNOSIS — Z933 Colostomy status: Secondary | ICD-10-CM | POA: Diagnosis not present

## 2021-03-29 DIAGNOSIS — E538 Deficiency of other specified B group vitamins: Secondary | ICD-10-CM | POA: Diagnosis not present

## 2021-03-29 DIAGNOSIS — M545 Low back pain, unspecified: Secondary | ICD-10-CM | POA: Diagnosis not present

## 2021-03-29 DIAGNOSIS — E785 Hyperlipidemia, unspecified: Secondary | ICD-10-CM | POA: Diagnosis not present

## 2021-03-29 DIAGNOSIS — E876 Hypokalemia: Secondary | ICD-10-CM | POA: Diagnosis not present

## 2021-03-29 DIAGNOSIS — K5190 Ulcerative colitis, unspecified, without complications: Secondary | ICD-10-CM | POA: Diagnosis not present

## 2021-03-29 DIAGNOSIS — I160 Hypertensive urgency: Secondary | ICD-10-CM | POA: Diagnosis not present

## 2021-03-29 DIAGNOSIS — R1084 Generalized abdominal pain: Secondary | ICD-10-CM | POA: Diagnosis not present

## 2021-03-29 DIAGNOSIS — R0902 Hypoxemia: Secondary | ICD-10-CM | POA: Diagnosis not present

## 2021-03-29 DIAGNOSIS — I4891 Unspecified atrial fibrillation: Secondary | ICD-10-CM | POA: Diagnosis not present

## 2021-03-29 DIAGNOSIS — I708 Atherosclerosis of other arteries: Secondary | ICD-10-CM | POA: Diagnosis not present

## 2021-03-29 DIAGNOSIS — E782 Mixed hyperlipidemia: Secondary | ICD-10-CM | POA: Diagnosis present

## 2021-03-29 DIAGNOSIS — I10 Essential (primary) hypertension: Secondary | ICD-10-CM | POA: Diagnosis not present

## 2021-03-29 DIAGNOSIS — I97191 Other postprocedural cardiac functional disturbances following other surgery: Secondary | ICD-10-CM | POA: Diagnosis not present

## 2021-03-29 DIAGNOSIS — K55069 Acute infarction of intestine, part and extent unspecified: Principal | ICD-10-CM | POA: Diagnosis present

## 2021-03-29 DIAGNOSIS — Z803 Family history of malignant neoplasm of breast: Secondary | ICD-10-CM | POA: Diagnosis not present

## 2021-03-29 DIAGNOSIS — Z9114 Patient's other noncompliance with medication regimen: Secondary | ICD-10-CM | POA: Diagnosis not present

## 2021-03-29 DIAGNOSIS — M6281 Muscle weakness (generalized): Secondary | ICD-10-CM | POA: Diagnosis not present

## 2021-03-29 DIAGNOSIS — K449 Diaphragmatic hernia without obstruction or gangrene: Secondary | ICD-10-CM | POA: Diagnosis not present

## 2021-03-29 DIAGNOSIS — R1032 Left lower quadrant pain: Secondary | ICD-10-CM | POA: Diagnosis not present

## 2021-03-29 DIAGNOSIS — Z4682 Encounter for fitting and adjustment of non-vascular catheter: Secondary | ICD-10-CM | POA: Diagnosis not present

## 2021-03-29 DIAGNOSIS — Z20822 Contact with and (suspected) exposure to covid-19: Secondary | ICD-10-CM | POA: Diagnosis not present

## 2021-03-29 DIAGNOSIS — R109 Unspecified abdominal pain: Secondary | ICD-10-CM | POA: Diagnosis not present

## 2021-03-29 DIAGNOSIS — Z7901 Long term (current) use of anticoagulants: Secondary | ICD-10-CM | POA: Diagnosis not present

## 2021-03-29 DIAGNOSIS — E039 Hypothyroidism, unspecified: Secondary | ICD-10-CM | POA: Diagnosis not present

## 2021-03-29 DIAGNOSIS — Z7401 Bed confinement status: Secondary | ICD-10-CM | POA: Diagnosis not present

## 2021-03-29 DIAGNOSIS — N200 Calculus of kidney: Secondary | ICD-10-CM | POA: Diagnosis not present

## 2021-03-29 DIAGNOSIS — I4819 Other persistent atrial fibrillation: Secondary | ICD-10-CM | POA: Diagnosis not present

## 2021-03-29 DIAGNOSIS — M810 Age-related osteoporosis without current pathological fracture: Secondary | ICD-10-CM | POA: Diagnosis present

## 2021-03-29 DIAGNOSIS — E872 Acidosis: Secondary | ICD-10-CM | POA: Diagnosis not present

## 2021-03-29 DIAGNOSIS — H8109 Meniere's disease, unspecified ear: Secondary | ICD-10-CM | POA: Diagnosis present

## 2021-03-29 DIAGNOSIS — R2681 Unsteadiness on feet: Secondary | ICD-10-CM | POA: Diagnosis not present

## 2021-03-29 DIAGNOSIS — Z9889 Other specified postprocedural states: Secondary | ICD-10-CM | POA: Diagnosis not present

## 2021-03-29 DIAGNOSIS — D6869 Other thrombophilia: Secondary | ICD-10-CM | POA: Diagnosis not present

## 2021-03-29 DIAGNOSIS — N179 Acute kidney failure, unspecified: Secondary | ICD-10-CM | POA: Diagnosis not present

## 2021-03-29 DIAGNOSIS — R4181 Age-related cognitive decline: Secondary | ICD-10-CM | POA: Diagnosis not present

## 2021-03-29 HISTORY — PX: ULTRASOUND GUIDANCE FOR VASCULAR ACCESS: SHX6516

## 2021-03-29 HISTORY — PX: INTRAOPERATIVE ARTERIOGRAM: SHX5157

## 2021-03-29 HISTORY — PX: ENDARTERECTOMY MESENTERIC: SHX5805

## 2021-03-29 LAB — CBC
HCT: 39 % (ref 36.0–46.0)
HCT: 35.1 % — ABNORMAL LOW (ref 36.0–46.0)
HCT: 40.4 % (ref 36.0–46.0)
HCT: 44.3 % (ref 36.0–46.0)
Hemoglobin: 13.1 g/dL (ref 12.0–15.0)
Hemoglobin: 12.1 g/dL (ref 12.0–15.0)
Hemoglobin: 13.8 g/dL (ref 12.0–15.0)
Hemoglobin: 14.8 g/dL (ref 12.0–15.0)
MCH: 32.2 pg (ref 26.0–34.0)
MCH: 33 pg (ref 26.0–34.0)
MCH: 32.1 pg (ref 26.0–34.0)
MCH: 31.9 pg (ref 26.0–34.0)
MCHC: 33.6 g/dL (ref 30.0–36.0)
MCHC: 34.5 g/dL (ref 30.0–36.0)
MCHC: 34.2 g/dL (ref 30.0–36.0)
MCHC: 33.4 g/dL (ref 30.0–36.0)
MCV: 95.8 fL (ref 80.0–100.0)
MCV: 95.6 fL (ref 80.0–100.0)
MCV: 94 fL (ref 80.0–100.0)
MCV: 95.5 fL (ref 80.0–100.0)
Platelets: 274 10*3/uL (ref 150–400)
Platelets: 225 10*3/uL (ref 150–400)
Platelets: 276 10*3/uL (ref 150–400)
Platelets: 297 10*3/uL (ref 150–400)
RBC: 4.07 MIL/uL (ref 3.87–5.11)
RBC: 3.67 MIL/uL — ABNORMAL LOW (ref 3.87–5.11)
RBC: 4.3 MIL/uL (ref 3.87–5.11)
RBC: 4.64 MIL/uL (ref 3.87–5.11)
RDW: 12.6 % (ref 11.5–15.5)
RDW: 12.5 % (ref 11.5–15.5)
RDW: 12.6 % (ref 11.5–15.5)
RDW: 12.8 % (ref 11.5–15.5)
WBC: 22.1 10*3/uL — ABNORMAL HIGH (ref 4.0–10.5)
WBC: 17.3 10*3/uL — ABNORMAL HIGH (ref 4.0–10.5)
WBC: 10.5 10*3/uL (ref 4.0–10.5)
WBC: 9.4 10*3/uL (ref 4.0–10.5)
nRBC: 0 % (ref 0.0–0.2)
nRBC: 0 % (ref 0.0–0.2)
nRBC: 0 % (ref 0.0–0.2)
nRBC: 0 % (ref 0.0–0.2)

## 2021-03-29 LAB — POCT I-STAT 7, (LYTES, BLD GAS, ICA,H+H)
Acid-base deficit: 7 mmol/L — ABNORMAL HIGH (ref 0.0–2.0)
Acid-base deficit: 8 mmol/L — ABNORMAL HIGH (ref 0.0–2.0)
Acid-base deficit: 6 mmol/L — ABNORMAL HIGH (ref 0.0–2.0)
Bicarbonate: 19.1 mmol/L — ABNORMAL LOW (ref 20.0–28.0)
Bicarbonate: 19.4 mmol/L — ABNORMAL LOW (ref 20.0–28.0)
Bicarbonate: 17.3 mmol/L — ABNORMAL LOW (ref 20.0–28.0)
Calcium, Ion: 1.12 mmol/L — ABNORMAL LOW (ref 1.15–1.40)
Calcium, Ion: 1.17 mmol/L (ref 1.15–1.40)
Calcium, Ion: 1.12 mmol/L — ABNORMAL LOW (ref 1.15–1.40)
HCT: 35 % — ABNORMAL LOW (ref 36.0–46.0)
HCT: 40 % (ref 36.0–46.0)
HCT: 41 % (ref 36.0–46.0)
Hemoglobin: 11.9 g/dL — ABNORMAL LOW (ref 12.0–15.0)
Hemoglobin: 13.6 g/dL (ref 12.0–15.0)
Hemoglobin: 13.9 g/dL (ref 12.0–15.0)
O2 Saturation: 100 %
O2 Saturation: 99 %
O2 Saturation: 99 %
Patient temperature: 31.6
Patient temperature: 34.6
Patient temperature: 35.8
Potassium: 3.9 mmol/L (ref 3.5–5.1)
Potassium: 3.6 mmol/L (ref 3.5–5.1)
Potassium: 4.1 mmol/L (ref 3.5–5.1)
Sodium: 137 mmol/L (ref 135–145)
Sodium: 138 mmol/L (ref 135–145)
Sodium: 139 mmol/L (ref 135–145)
TCO2: 20 mmol/L — ABNORMAL LOW (ref 22–32)
TCO2: 21 mmol/L — ABNORMAL LOW (ref 22–32)
TCO2: 18 mmol/L — ABNORMAL LOW (ref 22–32)
pCO2 arterial: 31.5 mmHg — ABNORMAL LOW (ref 32.0–48.0)
pCO2 arterial: 40.2 mmHg (ref 32.0–48.0)
pCO2 arterial: 27.4 mmHg — ABNORMAL LOW (ref 32.0–48.0)
pH, Arterial: 7.364 (ref 7.350–7.450)
pH, Arterial: 7.279 — ABNORMAL LOW (ref 7.350–7.450)
pH, Arterial: 7.403 (ref 7.350–7.450)
pO2, Arterial: 277 mmHg — ABNORMAL HIGH (ref 83.0–108.0)
pO2, Arterial: 138 mmHg — ABNORMAL HIGH (ref 83.0–108.0)
pO2, Arterial: 111 mmHg — ABNORMAL HIGH (ref 83.0–108.0)

## 2021-03-29 LAB — COMPREHENSIVE METABOLIC PANEL
ALT: 19 U/L (ref 0–44)
ALT: 23 U/L (ref 0–44)
AST: 30 U/L (ref 15–41)
AST: 47 U/L — ABNORMAL HIGH (ref 15–41)
Albumin: 4.2 g/dL (ref 3.5–5.0)
Albumin: 3.1 g/dL — ABNORMAL LOW (ref 3.5–5.0)
Alkaline Phosphatase: 59 U/L (ref 38–126)
Alkaline Phosphatase: 46 U/L (ref 38–126)
Anion gap: 7 (ref 5–15)
Anion gap: 8 (ref 5–15)
BUN: 22 mg/dL (ref 8–23)
BUN: 15 mg/dL (ref 8–23)
CO2: 27 mmol/L (ref 22–32)
CO2: 20 mmol/L — ABNORMAL LOW (ref 22–32)
Calcium: 9.6 mg/dL (ref 8.9–10.3)
Calcium: 7.5 mg/dL — ABNORMAL LOW (ref 8.9–10.3)
Chloride: 102 mmol/L (ref 98–111)
Chloride: 108 mmol/L (ref 98–111)
Creatinine, Ser: 0.86 mg/dL (ref 0.44–1.00)
Creatinine, Ser: 0.76 mg/dL (ref 0.44–1.00)
GFR, Estimated: >60 mL/min (ref >60)
GFR, Estimated: >60 mL/min (ref >60)
Glucose, Bld: 146 mg/dL — ABNORMAL HIGH (ref 70–99)
Glucose, Bld: 186 mg/dL — ABNORMAL HIGH (ref 70–99)
Potassium: 4 mmol/L (ref 3.5–5.1)
Potassium: 3.4 mmol/L — ABNORMAL LOW (ref 3.5–5.1)
Sodium: 136 mmol/L (ref 135–145)
Sodium: 136 mmol/L (ref 135–145)
Total Bilirubin: 0.5 mg/dL (ref 0.3–1.2)
Total Bilirubin: 0.7 mg/dL (ref 0.3–1.2)
Total Protein: 7 g/dL (ref 6.5–8.1)
Total Protein: 5.3 g/dL — ABNORMAL LOW (ref 6.5–8.1)

## 2021-03-29 LAB — CBC WITH DIFFERENTIAL/PLATELET
Abs Immature Granulocytes: 0.07 10*3/uL (ref 0.00–0.07)
Basophils Absolute: 0.1 10*3/uL (ref 0.0–0.1)
Basophils Relative: 0 %
Eosinophils Absolute: 0.2 10*3/uL (ref 0.0–0.5)
Eosinophils Relative: 2 %
HCT: 40.5 % (ref 36.0–46.0)
Hemoglobin: 13.4 g/dL (ref 12.0–15.0)
Immature Granulocytes: 1 %
Lymphocytes Relative: 9 %
Lymphs Abs: 1.1 10*3/uL (ref 0.7–4.0)
MCH: 32.1 pg (ref 26.0–34.0)
MCHC: 33.1 g/dL (ref 30.0–36.0)
MCV: 96.9 fL (ref 80.0–100.0)
Monocytes Absolute: 0.8 10*3/uL (ref 0.1–1.0)
Monocytes Relative: 7 %
Neutro Abs: 10.2 10*3/uL — ABNORMAL HIGH (ref 1.7–7.7)
Neutrophils Relative %: 81 %
Platelets: 287 10*3/uL (ref 150–400)
RBC: 4.18 MIL/uL (ref 3.87–5.11)
RDW: 12.4 % (ref 11.5–15.5)
WBC: 12.5 10*3/uL — ABNORMAL HIGH (ref 4.0–10.5)
nRBC: 0 % (ref 0.0–0.2)

## 2021-03-29 LAB — APTT: aPTT: 27 seconds (ref 24–36)

## 2021-03-29 LAB — TYPE AND SCREEN
ABO/RH(D): A POS
Antibody Screen: NEGATIVE

## 2021-03-29 LAB — LACTIC ACID, PLASMA
Lactic Acid, Venous: 1.3 mmol/L (ref 0.5–1.9)
Lactic Acid, Venous: 8.4 mmol/L (ref 0.5–1.9)
Lactic Acid, Venous: 2.8 mmol/L (ref 0.5–1.9)
Lactic Acid, Venous: 3 mmol/L (ref 0.5–1.9)
Lactic Acid, Venous: 3.4 mmol/L (ref 0.5–1.9)

## 2021-03-29 LAB — I-STAT BETA HCG BLOOD, ED (MC, WL, AP ONLY): I-stat hCG, quantitative: 6.5 m[IU]/mL — ABNORMAL HIGH (ref <5)

## 2021-03-29 LAB — LIPASE, BLOOD: Lipase: 50 U/L (ref 11–51)

## 2021-03-29 LAB — GLUCOSE, CAPILLARY
Glucose-Capillary: 178 mg/dL — ABNORMAL HIGH (ref 70–99)
Glucose-Capillary: 142 mg/dL — ABNORMAL HIGH (ref 70–99)

## 2021-03-29 LAB — HEPARIN LEVEL (UNFRACTIONATED): Heparin Unfractionated: 0.85 IU/mL — ABNORMAL HIGH (ref 0.30–0.70)

## 2021-03-29 LAB — RESP PANEL BY RT-PCR (FLU A&B, COVID) ARPGX2
Influenza A by PCR: NEGATIVE
Influenza B by PCR: NEGATIVE
SARS Coronavirus 2 by RT PCR: NEGATIVE

## 2021-03-29 LAB — PROTIME-INR
INR: 1 (ref 0.8–1.2)
Prothrombin Time: 13.7 seconds (ref 11.4–15.2)

## 2021-03-29 LAB — ABO/RH: ABO/RH(D): A POS

## 2021-03-29 LAB — TROPONIN I (HIGH SENSITIVITY)
Troponin I (High Sensitivity): 9 ng/L (ref <18)
Troponin I (High Sensitivity): 9 ng/L (ref <18)

## 2021-03-29 LAB — PREPARE RBC (CROSSMATCH)

## 2021-03-29 SURGERY — ULTRASOUND GUIDANCE, FOR VASCULAR ACCESS
Anesthesia: General | Site: Groin | Laterality: Right

## 2021-03-29 MED ORDER — DILTIAZEM HCL-DEXTROSE 125-5 MG/125ML-% IV SOLN (PREMIX)
5.0000 mg/h | INTRAVENOUS | Status: DC
  Filled 2021-03-29: qty 125

## 2021-03-29 MED ORDER — HYDRALAZINE HCL 20 MG/ML IJ SOLN: 10.0000 mg | INTRAMUSCULAR | Status: DC | PRN

## 2021-03-29 MED ORDER — PROPOFOL 10 MG/ML IV BOLUS
INTRAVENOUS | Status: AC
  Filled 2021-03-29: qty 20

## 2021-03-29 MED ORDER — LABETALOL HCL 5 MG/ML IV SOLN
10.0000 mg | INTRAVENOUS | Status: DC | PRN
  Administered 2021-03-29: 10 mg via INTRAVENOUS
  Filled 2021-03-29 (×2): qty 4

## 2021-03-29 MED ORDER — METOPROLOL TARTRATE 5 MG/5ML IV SOLN: INTRAVENOUS | Status: DC | PRN

## 2021-03-29 MED ORDER — SODIUM CHLORIDE 0.9% IV SOLUTION: Freq: Once | INTRAVENOUS | Status: AC

## 2021-03-29 MED ORDER — FENTANYL CITRATE (PF) 100 MCG/2ML IJ SOLN
100.0000 ug | Freq: Once | INTRAMUSCULAR | Status: AC
  Administered 2021-03-29: 100 ug via INTRAVENOUS
  Filled 2021-03-29: qty 2

## 2021-03-29 MED ORDER — HEPARIN SODIUM (PORCINE) 1000 UNIT/ML IJ SOLN
INTRAMUSCULAR | Status: DC | PRN
  Administered 2021-03-29: 7000 [IU] via INTRAVENOUS

## 2021-03-29 MED ORDER — ALBUMIN HUMAN 5 % IV SOLN: INTRAVENOUS | Status: DC | PRN

## 2021-03-29 MED ORDER — FENTANYL CITRATE (PF) 100 MCG/2ML IJ SOLN
INTRAMUSCULAR | Status: DC | PRN
  Administered 2021-03-29: 50 ug via INTRAVENOUS
  Administered 2021-03-29: 25 ug via INTRAVENOUS
  Administered 2021-03-29: 100 ug via INTRAVENOUS
  Administered 2021-03-29: 75 ug via INTRAVENOUS

## 2021-03-29 MED ORDER — LACTATED RINGERS IV BOLUS
1000.0000 mL | Freq: Once | INTRAVENOUS | Status: AC
  Administered 2021-03-29: 1000 mL via INTRAVENOUS

## 2021-03-29 MED ORDER — SODIUM CHLORIDE 0.9% FLUSH: 3.0000 mL | INTRAVENOUS | Status: DC | PRN

## 2021-03-29 MED ORDER — LABETALOL HCL 5 MG/ML IV SOLN
INTRAVENOUS | Status: DC | PRN
  Administered 2021-03-29: 10 mg via INTRAVENOUS

## 2021-03-29 MED ORDER — FENTANYL CITRATE (PF) 100 MCG/2ML IJ SOLN
50.0000 ug | Freq: Once | INTRAMUSCULAR | Status: AC
Start: 2021-03-29 — End: 2021-03-29
  Administered 2021-03-29: 50 ug via INTRAVENOUS
  Filled 2021-03-29: qty 2

## 2021-03-29 MED ORDER — PHENYLEPHRINE HCL-NACL 20-0.9 MG/250ML-% IV SOLN
INTRAVENOUS | Status: DC | PRN
  Administered 2021-03-29: 40 ug/min via INTRAVENOUS

## 2021-03-29 MED ORDER — CHLORHEXIDINE GLUCONATE 0.12% ORAL RINSE (MEDLINE KIT)
15.0000 mL | Freq: Two times a day (BID) | OROMUCOSAL | Status: DC
Start: 2021-03-29 — End: 2021-04-01
  Administered 2021-03-29 – 2021-03-31 (×6): 15 mL via OROMUCOSAL

## 2021-03-29 MED ORDER — IODIXANOL 320 MG/ML IV SOLN
INTRAVENOUS | Status: DC | PRN
  Administered 2021-03-29: 70 mL via INTRA_ARTERIAL

## 2021-03-29 MED ORDER — ACETAMINOPHEN 325 MG PO TABS: 650.0000 mg | ORAL_TABLET | ORAL | Status: DC | PRN

## 2021-03-29 MED ORDER — ORAL CARE MOUTH RINSE
15.0000 mL | OROMUCOSAL | Status: DC
  Administered 2021-03-29 – 2021-03-30 (×9): 15 mL via OROMUCOSAL

## 2021-03-29 MED ORDER — NOREPINEPHRINE 4 MG/250ML-% IV SOLN
0.0000 ug/min | INTRAVENOUS | Status: DC
Start: 2021-03-29 — End: 2021-03-30
  Administered 2021-03-29: 2 ug/min via INTRAVENOUS
  Filled 2021-03-29: qty 250

## 2021-03-29 MED ORDER — 0.9 % SODIUM CHLORIDE (POUR BTL) OPTIME
TOPICAL | Status: DC | PRN
  Administered 2021-03-29: 1000 mL

## 2021-03-29 MED ORDER — ONDANSETRON HCL 4 MG/2ML IJ SOLN
INTRAMUSCULAR | Status: DC | PRN
  Administered 2021-03-29: 4 mg via INTRAVENOUS

## 2021-03-29 MED ORDER — HYDROMORPHONE HCL 1 MG/ML IJ SOLN
0.5000 mg | Freq: Once | INTRAMUSCULAR | Status: AC
  Administered 2021-03-29: 0.5 mg via INTRAVENOUS
  Filled 2021-03-29: qty 1

## 2021-03-29 MED ORDER — HEPARIN (PORCINE) 25000 UT/250ML-% IV SOLN
850.0000 [IU]/h | INTRAVENOUS | Status: DC
  Administered 2021-03-29: 950 [IU]/h via INTRAVENOUS
  Filled 2021-03-29 (×3): qty 250

## 2021-03-29 MED ORDER — HYDRALAZINE HCL 20 MG/ML IJ SOLN: 5.0000 mg | INTRAMUSCULAR | Status: DC | PRN

## 2021-03-29 MED ORDER — LEVOTHYROXINE SODIUM 75 MCG PO TABS: 75.0000 ug | ORAL_TABLET | Freq: Every day | ORAL | 1 refills | Status: DC

## 2021-03-29 MED ORDER — FENTANYL CITRATE (PF) 100 MCG/2ML IJ SOLN: 25.0000 ug | INTRAMUSCULAR | Status: DC | PRN

## 2021-03-29 MED ORDER — MIDAZOLAM HCL 2 MG/2ML IJ SOLN: 2.0000 mg | INTRAMUSCULAR | Status: DC | PRN

## 2021-03-29 MED ORDER — PROPOFOL 10 MG/ML IV BOLUS
INTRAVENOUS | Status: DC | PRN
  Administered 2021-03-29: 90 mg via INTRAVENOUS

## 2021-03-29 MED ORDER — LACTATED RINGERS IV SOLN: INTRAVENOUS | Status: AC

## 2021-03-29 MED ORDER — DEXAMETHASONE SODIUM PHOSPHATE 10 MG/ML IJ SOLN
INTRAMUSCULAR | Status: DC | PRN
  Administered 2021-03-29: 10 mg via INTRAVENOUS

## 2021-03-29 MED ORDER — SODIUM CHLORIDE 0.9 % IV SOLN: 250.0000 mL | INTRAVENOUS | Status: DC | PRN

## 2021-03-29 MED ORDER — CHLORHEXIDINE GLUCONATE CLOTH 2 % EX PADS
6.0000 | MEDICATED_PAD | Freq: Every day | CUTANEOUS | Status: DC
  Administered 2021-03-29 – 2021-04-05 (×8): 6 via TOPICAL

## 2021-03-29 MED ORDER — SUCCINYLCHOLINE CHLORIDE 200 MG/10ML IV SOSY
PREFILLED_SYRINGE | INTRAVENOUS | Status: DC | PRN
  Administered 2021-03-29: 120 mg via INTRAVENOUS

## 2021-03-29 MED ORDER — PROPOFOL 1000 MG/100ML IV EMUL
5.0000 ug/kg/min | INTRAVENOUS | Status: DC
  Administered 2021-03-29: 30 ug/kg/min via INTRAVENOUS
  Administered 2021-03-30: 20 ug/kg/min via INTRAVENOUS
  Filled 2021-03-29 (×2): qty 100

## 2021-03-29 MED ORDER — LABETALOL HCL 5 MG/ML IV SOLN
10.0000 mg | INTRAVENOUS | Status: DC | PRN
  Administered 2021-03-30: 10 mg via INTRAVENOUS
  Filled 2021-03-29: qty 4

## 2021-03-29 MED ORDER — FENTANYL CITRATE (PF) 100 MCG/2ML IJ SOLN
50.0000 ug | Freq: Once | INTRAMUSCULAR | Status: AC
  Administered 2021-03-29: 50 ug via INTRAVENOUS
  Filled 2021-03-29: qty 2

## 2021-03-29 MED ORDER — HEPARIN 6000 UNIT IRRIGATION SOLUTION
Status: DC | PRN
  Administered 2021-03-29: 1

## 2021-03-29 MED ORDER — DOCUSATE SODIUM 50 MG/5ML PO LIQD: 100.0000 mg | Freq: Two times a day (BID) | ORAL | Status: DC

## 2021-03-29 MED ORDER — SODIUM CHLORIDE 0.9% FLUSH
3.0000 mL | Freq: Two times a day (BID) | INTRAVENOUS | Status: DC
  Administered 2021-03-29 – 2021-04-06 (×8): 3 mL via INTRAVENOUS

## 2021-03-29 MED ORDER — ONDANSETRON HCL 4 MG/2ML IJ SOLN: 4.0000 mg | Freq: Four times a day (QID) | INTRAMUSCULAR | Status: DC | PRN

## 2021-03-29 MED ORDER — LACTATED RINGERS IV SOLN: INTRAVENOUS | Status: DC | PRN

## 2021-03-29 MED ORDER — METOPROLOL TARTRATE 5 MG/5ML IV SOLN
2.5000 mg | Freq: Four times a day (QID) | INTRAVENOUS | Status: DC
  Administered 2021-03-29 – 2021-03-31 (×9): 2.5 mg via INTRAVENOUS
  Filled 2021-03-29 (×8): qty 5

## 2021-03-29 MED ORDER — PIPERACILLIN-TAZOBACTAM 3.375 G IVPB
3.3750 g | Freq: Three times a day (TID) | INTRAVENOUS | Status: AC
  Administered 2021-03-29 – 2021-04-01 (×12): 3.375 g via INTRAVENOUS
  Filled 2021-03-29 (×12): qty 50

## 2021-03-29 MED ORDER — IOHEXOL 350 MG/ML SOLN
80.0000 mL | Freq: Once | INTRAVENOUS | Status: AC | PRN
  Administered 2021-03-29: 80 mL via INTRAVENOUS

## 2021-03-29 MED ORDER — SODIUM CHLORIDE 0.9 % WEIGHT BASED INFUSION: 1.0000 mL/kg/h | INTRAVENOUS | Status: AC

## 2021-03-29 MED ORDER — POLYETHYLENE GLYCOL 3350 17 G PO PACK: 17.0000 g | PACK | Freq: Every day | ORAL | Status: DC

## 2021-03-29 MED ORDER — SODIUM CHLORIDE 0.9 % IV SOLN: INTRAVENOUS | Status: DC | PRN

## 2021-03-29 MED ORDER — FENTANYL CITRATE (PF) 100 MCG/2ML IJ SOLN
12.5000 ug | INTRAMUSCULAR | Status: DC | PRN
  Administered 2021-03-29: 50 ug via INTRAVENOUS
  Filled 2021-03-29: qty 2

## 2021-03-29 MED ORDER — SODIUM CHLORIDE 0.9 % IV SOLN
12.5000 mg | Freq: Four times a day (QID) | INTRAVENOUS | Status: DC | PRN
  Administered 2021-03-29 – 2021-03-31 (×2): 12.5 mg via INTRAVENOUS
  Filled 2021-03-29: qty 0.5
  Filled 2021-03-29: qty 12.5

## 2021-03-29 MED ORDER — ROCURONIUM BROMIDE 10 MG/ML (PF) SYRINGE
PREFILLED_SYRINGE | INTRAVENOUS | Status: DC | PRN
Start: 2021-03-29 — End: 2021-03-29
  Administered 2021-03-29: 40 mg via INTRAVENOUS
  Administered 2021-03-29: 30 mg via INTRAVENOUS

## 2021-03-29 MED ORDER — POTASSIUM CHLORIDE 10 MEQ/100ML IV SOLN
10.0000 meq | INTRAVENOUS | Status: AC
  Administered 2021-03-29 (×4): 10 meq via INTRAVENOUS
  Filled 2021-03-29 (×4): qty 100

## 2021-03-29 MED ORDER — PROPOFOL 500 MG/50ML IV EMUL
INTRAVENOUS | Status: DC | PRN
  Administered 2021-03-29: 20 ug/kg/min via INTRAVENOUS

## 2021-03-29 MED ORDER — ONDANSETRON HCL 4 MG/2ML IJ SOLN
4.0000 mg | Freq: Once | INTRAMUSCULAR | Status: AC
  Administered 2021-03-29: 4 mg via INTRAVENOUS
  Filled 2021-03-29: qty 2

## 2021-03-29 MED ORDER — FENTANYL CITRATE (PF) 250 MCG/5ML IJ SOLN
INTRAMUSCULAR | Status: AC
  Filled 2021-03-29: qty 5

## 2021-03-29 MED ORDER — HYDRALAZINE HCL 20 MG/ML IJ SOLN
10.0000 mg | INTRAMUSCULAR | Status: DC | PRN
  Administered 2021-03-29: 20 mg via INTRAVENOUS
  Filled 2021-03-29: qty 2

## 2021-03-29 MED ORDER — CEFAZOLIN SODIUM-DEXTROSE 1-4 GM/50ML-% IV SOLN
INTRAVENOUS | Status: DC | PRN
  Administered 2021-03-29: 1 g via INTRAVENOUS

## 2021-03-29 MED ORDER — ASPIRIN EC 81 MG PO TBEC
81.0000 mg | DELAYED_RELEASE_TABLET | Freq: Every day | ORAL | Status: DC
  Administered 2021-03-30: 81 mg via ORAL
  Filled 2021-03-29: qty 1

## 2021-03-29 MED ORDER — LEVOTHYROXINE SODIUM 100 MCG/5ML IV SOLN: 25.0000 ug | Freq: Every day | INTRAVENOUS | Status: DC

## 2021-03-29 SURGICAL SUPPLY — 28 items
ADH SKN CLS APL DERMABOND .7 (GAUZE/BANDAGES/DRESSINGS) ×4
CANISTER PENUMBRA ENGINE (MISCELLANEOUS) ×2 IMPLANT
CATH INDIGO CAT6 KIT (CATHETERS) ×2 IMPLANT
CATH OMNI FLUSH .035X70CM (CATHETERS) ×2 IMPLANT
COVER MAYO STAND STRL (DRAPES) ×3 IMPLANT
DERMABOND ADVANCED (GAUZE/BANDAGES/DRESSINGS) ×2
DERMABOND ADVANCED .7 DNX12 (GAUZE/BANDAGES/DRESSINGS) ×4 IMPLANT
DEVICE VASC CLSR CELT ART 7 (Vascular Products) ×2 IMPLANT
ELECT REM PT RETURN 9FT ADLT (ELECTROSURGICAL) ×3
ELECTRODE REM PT RTRN 9FT ADLT (ELECTROSURGICAL) ×2 IMPLANT
GLOVE SURG POLYISO LF SZ7.5 (GLOVE) ×3 IMPLANT
GLOVE SURG UNDER POLY LF SZ7.5 (GLOVE) ×3 IMPLANT
GOWN STRL REUS W/ TWL LRG LVL3 (GOWN DISPOSABLE) ×4 IMPLANT
GOWN STRL REUS W/ TWL XL LVL3 (GOWN DISPOSABLE) ×2 IMPLANT
GOWN STRL REUS W/TWL LRG LVL3 (GOWN DISPOSABLE) ×6
GOWN STRL REUS W/TWL XL LVL3 (GOWN DISPOSABLE) ×3
KIT BASIN OR (CUSTOM PROCEDURE TRAY) ×3 IMPLANT
KIT MICROPUNCTURE NIT STIFF (SHEATH) ×2 IMPLANT
KIT TURNOVER KIT B (KITS) ×3 IMPLANT
NS IRRIG 1000ML POUR BTL (IV SOLUTION) ×4 IMPLANT
PACK ENDO MINOR (CUSTOM PROCEDURE TRAY) ×2 IMPLANT
SHEATH BRITE TIP 7FRX11 (SHEATH) ×2 IMPLANT
SHEATH GUIDING 6.5 FR 55X73X9 (SHEATH) ×2 IMPLANT
SHEATH PINNACLE 6F 10CM (SHEATH) ×2 IMPLANT
STOPCOCK MORSE 400PSI 3WAY (MISCELLANEOUS) ×2 IMPLANT
TOWEL GREEN STERILE (TOWEL DISPOSABLE) ×3 IMPLANT
TUBING INJECTOR 48 (MISCELLANEOUS) ×2 IMPLANT
WIRE STARTER BENTSON 035X150 (WIRE) ×2 IMPLANT

## 2021-03-29 NOTE — Anesthesia Procedure Notes (Signed)
Central Venous Catheter Insertion Performed by: Oleta Mouse, MD, anesthesiologist Start/End8/15/2022 7:16 AM, 03/29/2021 7:22 AM Patient location: OR. Preanesthetic checklist: patient identified, IV checked, site marked, risks and benefits discussed, surgical consent, monitors and equipment checked, pre-op evaluation, timeout performed and anesthesia consent Position: supine Hand hygiene performed  and maximum sterile barriers used  Catheter size: 8 Fr Total catheter length 16. Central line was placed.Double lumen Procedure performed using ultrasound guided technique. Ultrasound Notes:image(s) printed for medical record Attempts: 1 Following insertion, dressing applied, line sutured and Biopatch. Post procedure assessment: blood return through all ports and free fluid flow  Patient tolerated the procedure well with no immediate complications.

## 2021-03-29 NOTE — Progress Notes (Signed)
ANTICOAGULATION CONSULT NOTE - Initial Consult  Pharmacy Consult for heparin Indication: atrial fibrillation  No Known Allergies  Patient Measurements: Height: 5' 7"  (170.2 cm) Weight: 68 kg (150 lb) IBW/kg (Calculated) : 61.6 Heparin Dosing Weight: 68 kg   Vital Signs: Temp: 97.7 F (36.5 C) (08/14 2231) Temp Source: Oral (08/14 2231) BP: 222/123 (08/15 0500) Pulse Rate: 58 (08/15 0500)  Labs: Recent Labs    03/28/21 2300 03/28/21 2356 03/29/21 0156 03/29/21 0235  HGB 13.4 14.6  --  13.1  HCT 40.5 43.0  --  39.0  PLT 287  --   --  274  APTT 27  --   --   --   LABPROT 13.7  --   --   --   INR 1.0  --   --   --   CREATININE 0.86 0.80  --   --   TROPONINIHS 9  --  9  --     Estimated Creatinine Clearance: 50.9 mL/min (by C-G formula based on SCr of 0.8 mg/dL).   Medical History: Past Medical History:  Diagnosis Date   COVID 12/18/2020   mild   HTN (hypertension)    Normal at home   Hyperlipidemia    Hypothyroidism    Ileostomy in place [facility])    Memory loss    mild but not dx by doctor per Neice   Meniere disease 1996   Osteoporosis    UC (ulcerative colitis) ([place])     Medications:  Scheduled:   sodium chloride   Intravenous Once   [START ON 04/01/2021] pantoprazole  40 mg Intravenous Q12H    Assessment: 19 yof 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 Now s/p mechanical thrombectomy on 8/15. No AC PTA.  Hgb 13.1, plt 274. Trop 9. Okay per vascular to start heparin infusion at 12 on 8/15.   Goal of Therapy:  Heparin level 0.3-0.7 units/ml Monitor platelets by anticoagulation protocol: Yes   Plan:  Will start heparin infusion at 950 units/hr on 8/15@1200  Order heparin level 8 hour after start Monitor daily HL, CBC, and for s/sx of bleeding   Antonietta Jewel, PharmD, [place] Pharmacist  Phone: [phone] 03/29/2021 10:02 AM  Please check AMION for all [place] phone numbers After 10:00 PM, call [place]  [phone]

## 2021-03-29 NOTE — Progress Notes (Addendum)
Date and time results received: 03/29/21 1200  (use smartphrase ".now" to insert current time)  Test: Lactic Critical Value: 2.8  Name of Provider Notified: Marni Griffon, NP  Orders Received? Or Actions Taken?: Expected value, trending down,  Consuella Lose RN

## 2021-03-29 NOTE — Progress Notes (Signed)
Pharmacy Antibiotic Note  JOANETTE SILVERIA is a 87 y.o. female admitted on 03/28/2021 with  abdominal pain and some bleeding from her colostomy.[name]  Pharmacy has been consulted to dose zosyn for intra-abdominal infection  Plan: Zosyn 3.375g IV q8h (4 hour infusion). Pharmacy will sign off and follow peripherally  Height: 5' 7"  (170.2 cm) Weight: 68 kg (150 lb) IBW/kg (Calculated) : 61.6  Temp (24hrs), Avg:97.7 F (36.5 C), Min:97.7 F (36.5 C), Max:97.7 F (36.5 C)  Recent Labs  Lab 03/28/21 2300 03/28/21 2309 03/28/21 2356 03/29/21 0235 03/29/21 0327  WBC 12.5*  --   --  22.1*  --   CREATININE 0.86  --  0.80  --   --   LATICACIDVEN  --  1.3  --   --  8.4*    Estimated Creatinine Clearance: 50.9 mL/min (by C-G formula based on SCr of 0.8 mg/dL).    No Known Allergies   Thank you for allowing pharmacy to be a part of this patient's care.  Dolly Rias RPh 03/29/2021, 4:44 AM

## 2021-03-29 NOTE — ED Notes (Signed)
Nephew still at bedside with patient. Patient states she is still in pain. None of the medication has helped. Patient seems restless. Medication for blood pressure given.

## 2021-03-29 NOTE — Op Note (Signed)
    Patient name: Jasmine Mccormick MRN: [id number] DOB: [birth year] Sex: female  03/29/2021 Pre-operative Diagnosis: Acute mesenteric ischemia Post-operative diagnosis:  Same Surgeon:  Annamarie Major Procedure Performed:  1.  Ultrasound-guided access, right femoral artery  2.  Abdominal aortogram  3.  Superior mesenteric artery angiogram  4.  Mechanical thrombectomy, of the superior mesenteric artery, using penumbra device   5.  Closure device (celt)    Indications:  This is a 87 year old female with acute abdominal pain with CTA showing SMA occlusion.  I discussed proceeding to the operating room for attempted percutaneous mechanical thrombectomy.  We also discussed possibility of open intervention with bowel resection.  I spoke with general surgery as well.  Her aunt is her power of attorney and agrees to proceed.  Procedure:  The patient was identified in the holding area and taken to room 16.  General endotracheal anesthesia was administered.  IV antibiotics were administered.  The patient was then placed supine on the table and prepped and draped in the usual sterile fashion.  A time out was called.   Ultrasound was used to evaluate the right common femoral artery.  It was patent .  A digital ultrasound image was acquired.  A micropuncture needle was used to access the right common femoral artery under ultrasound guidance.  An 018 wire was advanced without resistance and a micropuncture sheath was placed.  The 018 wire was removed and a benson wire was placed.  The micropuncture sheath was exchanged for a 5 french sheath.  An omniflush catheter was advanced over the wire to the level of L-1.  An abdominal angiogram was obtained.    Findings:   Aortogram: The infrarenal abdominal aorta and iliac arteries are calcified but patent without significant stenosis.  Bilateral renal arteries are patent without significant stenosis.  The celiac artery opacifies.  The superior mesenteric artery appears  to occlude approximately 10 cm beyond its origin with no distal reconstitution.  Minimal visualization of small bowel branches are visualized   Intervention: After above images were acquired the decision was made to proceed with intervention.  A 6.5 French Oscore sheath was inserted and used to select the superior mesenteric artery.  The patient was fully heparinized.  I then selected the penumbra CAT 6 device and proceeded with mechanical thrombectomy.  Multiple passes were made until the thrombus was evacuated.  Follow-up imaging showed a diseased artery however the distal branches were now patent and the occlusion was resolved.  I elected to stop at this point.  Catheters and wires were removed.  The groin was closed using the Celt device  Impression:  #1  Superior mesenteric artery occlusion successfully recanalized using the penumbra CAT 6 device for mechanical thrombectomy.   Theotis Burrow, M.D., [facility] [place] Office: [phone] Pager:  [phone]

## 2021-03-29 NOTE — ED Notes (Signed)
Patient states she is still nauseous and having abdominal pain.

## 2021-03-29 NOTE — Anesthesia Preprocedure Evaluation (Addendum)
Anesthesia Evaluation  Patient identified by MRN, date of birth, ID band Patient awake    Reviewed: Allergy & Precautions, NPO status , Patient's Chart, lab work & pertinent test results  History of Anesthesia Complications Negative for: history of anesthetic complications  Airway Mallampati: III  TM Distance: <3 FB Neck ROM: Full    Dental  (+) Dental Advisory Given, Teeth Intact   Pulmonary neg shortness of breath, neg sleep apnea, neg COPD, neg recent URI,  Covid-19 Nucleic Acid Test Results Lab Results      Component                Value               Date                      SARSCOV2NAA              NEGATIVE            03/29/2021                SARSCOV2NAA              POSITIVE (A)        01/07/2021              breath sounds clear to auscultation       Cardiovascular hypertension, Pt. on medications (-) angina(-) Past MI and (-) CHF  Rhythm:Irregular     Neuro/Psych  Neuromuscular disease    GI/Hepatic Neg liver ROS, PUD, Mesenteric Artery Blockage UC   Endo/Other  Hypothyroidism   Renal/GU Lab Results      Component                Value               Date                      CREATININE               0.80                03/28/2021           Lab Results      Component                Value               Date                      K                        4.4                 03/28/2021                Musculoskeletal negative musculoskeletal ROS (+)   Abdominal ileostomy  Peds  Hematology Lab Results      Component                Value               Date                      WBC                      22.1 (H)  03/29/2021                HGB                      13.1                03/29/2021                HCT                      39.0                03/29/2021                MCV                      95.8                03/29/2021                PLT                      274                  03/29/2021              Anesthesia Other Findings Elevated WBC, lactate 8.4  Reproductive/Obstetrics                           Anesthesia Physical Anesthesia Plan  ASA: 4 and emergent  Anesthesia Plan: General   Post-op Pain Management:    Induction: Intravenous  PONV Risk Score and Plan: 3 and Ondansetron and Dexamethasone  Airway Management Planned: Oral ETT  Additional Equipment: Arterial line  Intra-op Plan:   Post-operative Plan: Possible Post-op intubation/ventilation  Informed Consent: I have reviewed the patients History and Physical, chart, labs and discussed the procedure including the risks, benefits and alternatives for the proposed anesthesia with the patient or authorized representative who has indicated his/her understanding and acceptance.     Dental advisory given  Plan Discussed with: CRNA and Anesthesiologist  Anesthesia Plan Comments:         Anesthesia Quick Evaluation

## 2021-03-29 NOTE — Anesthesia Procedure Notes (Signed)
Arterial Line Insertion Start/End8/15/2022 6:10 AM, 03/29/2021 6:20 AM Performed by: Miangel Flom T, Immunologist, CRNA  Patient location: Pre-op. Preanesthetic checklist: patient identified, IV checked and risks and benefits discussed Emergency situation Lidocaine 1% used for infiltration Right, radial was placed Catheter size: 20 G Hand hygiene performed  and maximum sterile barriers used   Attempts: 2 Procedure performed without using ultrasound guided technique. Following insertion, dressing applied and Biopatch. Post procedure assessment: normal  Patient tolerated the procedure well with no immediate complications.

## 2021-03-29 NOTE — ED Notes (Signed)
Back from CT

## 2021-03-29 NOTE — Progress Notes (Signed)
Pt sedated on vent. VSS Abd: soft, nttp, no rebound, guarding  Will con't to monitor abd exams and labs D/w niece in the room that hopefully she can avoid surgery if possible.

## 2021-03-29 NOTE — Consult Note (Addendum)
Surgical Evaluation Requesting provider: Dr. Trula Mccormick  Chief Complaint: Abdominal pain  HPI: 87 year old woman with history of total colectomy/end ileostomy for ulcerative colitis many years ago who presents with several weeks of abdominal pain which acutely worsened yesterday evening.  Associated with hematochezia.  Work-up in the emergency room notable for mid/distal-SMA occlusion with partial reconstitution, no radiographic signs of bowel ischemia at the time of the scan which was around midnight, but rising lactate from 1.3 initially to 8.4, 4 hours later, worsening leukocytosis up to 22.1. Pain has worsened throughout her course in the ER.   No Known Allergies  Past Medical History:  Diagnosis Date   COVID 12/18/2020   mild   HTN (hypertension)    Normal at home   Hyperlipidemia    Hypothyroidism    Ileostomy in place [facility])    Memory loss    mild but not dx by doctor per [facility]   Meniere disease 1996   Osteoporosis    UC (ulcerative colitis) ([place])     Past Surgical History:  Procedure Laterality Date   COLECTOMY     COLONOSCOPY     Ear Shunt for Menier's     ILEOSTOMY     KYPHOPLASTY N/A 01/07/2021   Procedure: Lumbar three Kyphoplasty;  Surgeon: Dawley, Theodoro Doing, DO;  Location: [place];  Service: Neurosurgery;  Laterality: N/A;   PROCTECTOMY      Family History  Problem Relation Age of Onset   Hypertension Other    Heart disease Other    Breast cancer Sister     Social History   Socioeconomic History   Marital status: Widowed    Spouse name: Not on file   Number of children: Not on file   Years of education: 12   Highest education level: Not on file  Occupational History   Occupation: Retired  Tobacco Use   Smoking status: Never   Smokeless tobacco: Never  Vaping Use   Vaping Use: Never used  Substance and Sexual Activity   Alcohol use: No   Drug use: No   Sexual activity: Not Currently    Birth control/protection: Post-menopausal  Other Topics Concern    Not on file  Social History Narrative   Not on file   Social Determinants of Health   Financial Resource Strain: Low Risk    Difficulty of Paying Living Expenses: Not hard at all  Food Insecurity: No Food Insecurity   Worried About Charity fundraiser in the Last Year: Never true   [place] in the Last Year: Never true  Transportation Needs: No Transportation Needs   Lack of Transportation (Medical): No   Lack of Transportation (Non-Medical): No  Physical Activity: Inactive   Days of Exercise per Week: 0 days   Minutes of Exercise per Session: 0 min  Stress: No Stress Concern Present   Feeling of Stress : Not at all  Social Connections: Moderately Integrated   Frequency of Communication with Friends and Family: More than three times a week   Frequency of Social Gatherings with Friends and Family: More than three times a week   Attends Religious Services: More than 4 times per year   Active Member of [employer] or Organizations: Yes   Attends Archivist Meetings: More than 4 times per year   Marital Status: Widowed    No current facility-administered medications on file prior to encounter.   Current Outpatient Medications on File Prior to Encounter  Medication Sig Dispense Refill  ASPIRIN 81 PO Take 1 tablet by mouth daily.     ezetimibe-simvastatin (VYTORIN) 10-20 MG tablet Take 1 tablet by mouth daily.     Multiple Vitamins-Minerals (MULTIVITAMIN,TX-MINERALS) tablet Take 1 tablet by mouth daily.     SYNTHROID 50 MCG tablet Take 75 mcg a day Mon-Fri and 50 mcg a day on Sat and Sun (Patient taking differently: Take 50 mcg by mouth daily before breakfast.) 90 tablet 3    Review of Systems: a complete, 10pt review of systems was completed with pertinent positives and negatives as documented in the HPI  Physical Exam: Vitals:   03/29/21 0400 03/29/21 0500  BP: (!) 188/105 (!) 222/123  Pulse: 91 (!) 58  Resp: (!) 21 (!) 21  Temp:    SpO2: 97% 99%   Gen: Alert,  answering questions appropriately, in distress Eyes: lids and conjunctivae normal, no icterus. Pupils equally round and reactive to light.  Neck: supple without mass or thyromegaly Chest: respiratory effort is normal. No crepitus or tenderness on palpation of the chest. Breath sounds equal.  Cardiovascular: irregular, afib on the monitor, palpable distal pulses, no pedal edema Gastrointestinal: distended, diffusely tender with guarding in the central abdomen. Well-healed midline scar without appreciable hernia. RLQ ileostomy in place. Lymphatic: no lymphadenopathy in the neck or groin Muscoloskeletal: no clubbing or cyanosis of the fingers.  Strength is symmetrical throughout.  Range of motion of bilateral upper and lower extremities normal without pain, crepitation or contracture. Neuro: cranial nerves grossly intact.  Sensation intact to light touch diffusely. Psych: appropriate mood and affect, normal insight/judgment intact  Skin: warm and dry   CBC Latest Ref Rng & Units 03/29/2021 03/28/2021 03/28/2021  WBC 4.0 - 10.5 K/uL 22.1(H) - 12.5(H)  Hemoglobin 12.0 - 15.0 g/dL 13.1 14.6 13.4  Hematocrit 36.0 - 46.0 % 39.0 43.0 40.5  Platelets 150 - 400 K/uL 274 - 287    CMP Latest Ref Rng & Units 03/28/2021 03/28/2021 03/17/2021  Glucose 70 - 99 mg/dL 151(H) 146(H) 67(L)  BUN 8 - 23 mg/dL 23 22 18   Creatinine 0.44 - 1.00 mg/dL 0.80 0.86 0.92  Sodium 135 - 145 mmol/L 135 136 131(L)  Potassium 3.5 - 5.1 mmol/L 4.4 4.0 4.6  Chloride 98 - 111 mmol/L 101 102 96  CO2 22 - 32 mmol/L - 27 26  Calcium 8.9 - 10.3 mg/dL - 9.6 10.1  Total Protein 6.5 - 8.1 g/dL - 7.0 6.9  Total Bilirubin 0.3 - 1.2 mg/dL - 0.5 0.5  Alkaline Phos 38 - 126 U/L - 59 64  AST 15 - 41 U/L - 30 24  ALT 0 - 44 U/L - 19 16    Lab Results  Component Value Date   INR 1.0 03/28/2021    Imaging: DG Chest Portable 1 View  Result Date: 03/28/2021 CLINICAL DATA:  Pleuritic chest pain. EXAM: PORTABLE CHEST 1 VIEW COMPARISON:   None. FINDINGS: Significant patient rotation limits assessment. Heart size is grossly normal. There is aortic atherosclerosis. Borderline hyperinflation with minimal bronchial thickening. No confluent airspace disease. Limited assessment of the left lung base due to overlapping soft tissue structures. No pneumothorax. Limited assessment for pleural effusion due to positioning. No pulmonary edema. Degenerative change of both shoulders. No acute osseous abnormalities are seen. IMPRESSION: Significant patient rotation limits assessment. Borderline hyperinflation and minimal bronchial thickening. Electronically Signed   By: Keith Rake M.D.   On: 03/28/2021 23:54   CT Angio Abd/Pel W and/or Wo Contrast  Result Date: 03/29/2021 CLINICAL  DATA:  Abdominal pain radiating to the chest. EXAM: CTA ABDOMEN AND PELVIS WITHOUT AND WITH CONTRAST TECHNIQUE: Multidetector CT imaging of the abdomen and pelvis was performed using the standard protocol during bolus administration of intravenous contrast. Multiplanar reconstructed images and MIPs were obtained and reviewed to evaluate the vascular anatomy. CONTRAST:  52m OMNIPAQUE IOHEXOL 350 MG/ML SOLN COMPARISON:  None. FINDINGS: VASCULAR Aorta: Moderate to marked severity calcification of a caliber aorta without aneurysm, dissection, vasculitis or significant stenosis. Celiac: Patent without evidence of aneurysm, dissection, vasculitis or significant stenosis. SMA: Occlusion of the distal aspect of the superior mesenteric artery is seen (axial CT images 62 through 73, CT series 6). Enhancement of 1 of its distal branches is present. Renals: Both renal arteries are patent without evidence of aneurysm, dissection, vasculitis, fibromuscular dysplasia or significant stenosis. IMA: Not clearly visualized. Inflow: Moderate to marked severity diffuse calcification without evidence of aneurysm, dissection, vasculitis or significant stenosis. Proximal Outflow: Bilateral common  femoral and visualized portions of the superficial and profunda femoral arteries are patent without evidence of aneurysm, dissection, vasculitis or significant stenosis. Veins: No obvious venous abnormality within the limitations of this arterial phase study. Review of the MIP images confirms the above findings. NON-VASCULAR Lower chest: Mild to moderate severity scarring and/or atelectasis is seen along the posterior aspect of the bilateral lung bases. Hepatobiliary: A punctate calcified granuloma is seen within the anterior aspect of the right lobe of the liver. The gallbladder is not clearly identified and may be partially contracted. Pancreas: Unremarkable. No pancreatic ductal dilatation or surrounding inflammatory changes. Spleen: Normal in size without focal abnormality. Adrenals/Urinary Tract: Adrenal glands are unremarkable. Kidneys are normal in size, without obstructing renal calculi or hydronephrosis. Cortical scarring is seen along the lateral aspect of the mid left kidney. An adjacent 1.7 cm diameter cystic appearing area is noted. A 2 mm nonobstructing renal stone is seen within the mid to upper left kidney. Bladder is unremarkable. Stomach/Bowel: There is a large hiatal hernia. Appendix appears normal. No evidence of bowel wall thickening, distention, or inflammatory changes. Lymphatic: No abnormal abdominal or pelvic lymph nodes are identified. Reproductive: Uterus and bilateral adnexa are unremarkable. Other: A right lower quadrant ostomy site is seen. No abdominopelvic ascites. Musculoskeletal: There is evidence of prior vertebroplasty at the level of L3. Marked severity chronic and degenerative changes are also seen at the level of L2-L3. IMPRESSION: VASCULAR 1. 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 2. Moderate to marked severity diffuse calcification and atherosclerosis throughout the remaining arterial structures within the abdomen and pelvis.  NON-VASCULAR 1. 2 mm nonobstructing left renal stone. 2. Postoperative changes with a subsequent right lower quadrant ostomy site. 3. Large hiatal hernia. Electronically Signed   By: TVirgina NorfolkM.D.   On: 03/29/2021 01:35     A/P: Bowel ischemia from SMA occlusion.  Acute on chronic versus embolic from undiagnosed A. Fib.  Hypertensive urgency.  To OR urgently for attempted revascularization.  Will likely require exploration at some point pending results of attempted percutaneous revascularization. General surgery team will be immediately available for intraop assistance and will follow post-op.    Patient Active Problem List   Diagnosis Date Noted   GI bleeding 03/29/2021   Hypertensive urgency 03/29/2021   Acute GI bleeding 03/29/2021   Abdominal pain 03/29/2021   Grief 03/17/2021   Gait disorder 03/17/2021   [place] 02/16/20[place]   UTI (urinary tract infection) 02/01/2021  COVID-19 12/22/2020   Body mass index (BMI) 26.0-26.9, adult 11/24/2020   Elevated blood-pressure reading, without diagnosis of hypertension 11/24/2020   Acute bilateral low back pain with left-sided sciatica 11/12/2020   Thigh shingles 04/02/2020   Acute right hip pain 04/02/2020   Dysuria 08/18/2017   Hearing loss of both ears due to cerumen impaction 09/03/2015   Callus of foot 09/03/2015   Low vitamin B12 level 09/03/2015   Right-sided face pain 08/21/2014   Well adult exam 07/04/2012   Hyperglycemia 07/04/2012   Hand dermatitis 06/22/2011   Hypothyroidism 12/07/2010   Actinic keratosis 05/04/2010   NEOPLASM, SKIN, UNCERTAIN BEHAVIOR 18/40/3754   CERUMEN IMPACTION 09/26/2007   RASH-NONVESICULAR 06/12/2007   Dyslipidemia 03/19/2007   MENIERE'S DISEASE 03/19/2007   Essential hypertension 03/19/2007   Ulcerative colitis ([place]) 03/19/2007   Osteoporosis 03/19/2007       Romana Juniper, MD [facility]  See AMION to contact appropriate on-call  provider

## 2021-03-29 NOTE — Consult Note (Signed)
NAME:  JANNICE BEITZEL, MRN:  [id number], DOB:  [birth year], LOS: 0 ADMISSION DATE:  03/28/2021, CONSULTATION DATE:  8/15 REFERRING MD:  Trula Slade, CHIEF COMPLAINT:  critical care services and vent management    History of Present Illness:  This is an 87 year old female with history as mentioned below who presented to the emergency room On 8/14 with chief complaint of abdominal pain, as well as dark-colored frank blood in her ileostomy drainage bag.  Significant findings in the emergency room showed new acute renal failure, mild leukocytosis with white blood cell count of 12.5 rising to 22.1, and CT angiogram of abdomen and pelvis showing occlusion of distal superior mesenteric artery with reconstitution.  Her initial lactate was 8.4.  She was given IV fluids, empiric antibiotics initiated, IV Protonix drip initiated, and both general surgery and vascular surgery were consulted.  She was brought to the operating room on 8/15 where she underwent superior mesenteric artery angiogram with successful mechanical thrombectomy of superior mesenteric artery, intraoperatively did have atrial fibrillation with RVR, which responded to labetalol, subsequently had hypotension requiring phenylephrine infusion.  Because of her preoperative acidosis, she was left intubated to ensure optimal respiratory compensation, and critical care asked to assist with her management. Pertinent  Medical History  Ulcerative colitis, prior colectomy with ileostomy done about 20 years prior.  Hypertension, hypothyroidism.  COVID back in May 2022.  Hyperlipidemia.  Significant Hospital Events: Including procedures, antibiotic start and stop dates in addition to other pertinent events   8/14 presented to the emergency room with chief complaint of abdominal pain, and bloody output from ileostomy bag.  CT angiogram of abdomen pelvis showed occlusion of distal superior mesenteric artery 8/15 seen by both general surgery as well as vascular  surgery.  Started on IV Zosyn for leukocytosis and concern about bacterial translocation from gut ischemia.  Intubated.  Went to the OR with vascular surgery where she underwent successful mechanical thrombectomy of superior mesenteric artery thrombus.  Briefly on IV Protonix, this was changed to every 12 dosing postoperatively.  Critical care asked to assist with management  Interim History / Subjective:  Sedated on propofol.  Requiring low-dose phenylephrine infusion.  Objective   Blood pressure (Abnormal) 222/123, pulse (Abnormal) 58, temperature 97.7 F (36.5 C), temperature source Oral, resp. rate (Abnormal) 21, height 5' 7"  (1.702 m), weight 68 kg, SpO2 99 %.        Intake/Output Summary (Last 24 hours) at 03/29/2021 0946 Last data filed at 03/29/2021 9767 Gross per 24 hour  Intake 1898.4 ml  Output 1175 ml  Net 723.4 ml   Filed Weights   03/28/21 2231 03/28/21 2243  Weight: 72.6 kg 68 kg    Examination: General: 87 year old female sedated on mechanical ventilator HENT: Normocephalic atraumatic right IJ triple-lumen catheter in place pupils equal reactive orally intubated Lungs: Clear, diminished bases.  Equal bilateral breath sounds Cardiovascular: Regular irregular.  Atrial fibrillation on telemetry Abdomen: Abdomen soft.  No tenderness currently.  However sedation limiting the exam.  Ileostomy pouch still has bloody output.  Stoma pink, with good capillary refill Extremities: Cool.  Requires Doppler to assess pulses no edema Neuro: Currently sedated GU: Foley catheter in place  Resolved Hospital Problem list     Assessment & Plan:   Status post successful mechanical thrombectomy of superior mesenteric artery thrombus with associated ischemic bowel and abdominal pain-->? embolic from AF? Has h/o UC and prior colectomy w/ ileostomy -still have bloody output from ileostomy Plan IV heparin per vascular surg  Change PPI to q 12  Serial abd exam and lactic acids. Hope that  ischemia resolved but not out of woods for exploration if lactate climbing Serial cbcs PRN analgesia   SIRS w/ severe lactic acidosis in setting of ischemic bowel. Can't exclude bacterial translocation  Plan Cont IV hydration Tele  Day 1 zosyn  AKI At risk for worsening injury s/p intra-op hypotension, dye load etc Plan IV hydration Keep foley Serial chems Renal dose meds   HTN/ hypertensive urgency  -could be pain.  Plan Rx pain first PAD protocol started PRN hydralazine for SBP > 180  Atrial fib. Not sure if this is acute as of this illness OR chronic and not identified Plan Cont tele ECHO Heparin gtt Low dose IV BB  Need for mechanical ventilation.  Intubated for surgery. Kept intubated/ventilated to ensure adequate respiratory compensation for underlying metabolic acidosis Plan Cont full vent support, Ve adjusted to allow her to set her own Ve  Decrease sedation; RASS goal 0 to -1 Dc prop. Use PRN fent VAP bundle  F/u PCXR  IF serial chemistries show clearing acidosis can assess for SBT  H/o hypothyroidism   Apparently has not been taking her meds per report d/t pain Plan IV synthroid (1/2 her home dose)   Mild hyperglycemia Plan Ssi  Goal 140-180     Best Practice (right click and "Reselect all SmartList Selections" daily)   Diet/type: NPO DVT prophylaxis: systemic heparin GI prophylaxis: PPI Lines: Central line and yes and it is still needed Foley:  Yes, and it is still needed Code Status:  full code Last date of multidisciplinary goals of care discussion [pending ]  Labs   CBC: Recent Labs  Lab 03/28/21 2300 03/28/21 2356 03/29/21 0235  WBC 12.5*  --  22.1*  NEUTROABS 10.2*  --   --   HGB 13.4 14.6 13.1  HCT 40.5 43.0 39.0  MCV 96.9  --  95.8  PLT 287  --  924    Basic Metabolic Panel: Recent Labs  Lab 03/28/21 2300 03/28/21 2356  NA 136 135  K 4.0 4.4  CL 102 101  CO2 27  --   GLUCOSE 146* 151*  BUN 22 23  CREATININE  0.86 0.80  CALCIUM 9.6  --    GFR: Estimated Creatinine Clearance: 50.9 mL/min (by C-G formula based on SCr of 0.8 mg/dL). Recent Labs  Lab 03/28/21 2300 03/28/21 2309 03/29/21 0235 03/29/21 0327  WBC 12.5*  --  22.1*  --   LATICACIDVEN  --  1.3  --  8.4*    Liver Function Tests: Recent Labs  Lab 03/28/21 2300  AST 30  ALT 19  ALKPHOS 59  BILITOT 0.5  PROT 7.0  ALBUMIN 4.2   Recent Labs  Lab 03/28/21 2300  LIPASE 50   No results for input(s): AMMONIA in the last 168 hours.  ABG    Component Value Date/Time   TCO2 26 03/28/2021 2356     Coagulation Profile: Recent Labs  Lab 03/28/21 2300  INR 1.0    Cardiac Enzymes: No results for input(s): CKTOTAL, CKMB, CKMBINDEX, TROPONINI in the last 168 hours.  HbA1C: Hgb A1c MFr Bld  Date/Time Value Ref Range Status  09/17/2020 11:22 AM 6.2 4.6 - 6.5 % Final    Comment:    Glycemic Control Guidelines for People with Diabetes:Non Diabetic:  <6%Goal of Therapy: <7%Additional Action Suggested:  >8%   08/13/2013 07:28 AM 6.2 4.6 - 6.5 % Final  Comment:    Glycemic Control Guidelines for People with Diabetes:Non Diabetic:  <6%Goal of Therapy: <7%Additional Action Suggested:  >8%     CBG: No results for input(s): GLUCAP in the last 168 hours.  Review of Systems:   Not able   Past Medical History:  She,  has a past medical history of COVID (12/18/2020), HTN (hypertension), Hyperlipidemia, Hypothyroidism, Ileostomy in place [facility]), Memory loss, Meniere disease (1996), Osteoporosis, and UC (ulcerative colitis) ([place]).   Surgical History:   Past Surgical History:  Procedure Laterality Date   COLECTOMY     COLONOSCOPY     Ear Shunt for Menier's     ILEOSTOMY     KYPHOPLASTY N/A 01/07/2021   Procedure: Lumbar three Kyphoplasty;  Surgeon: Dawley, Theodoro Doing, DO;  Location: [place];  Service: Neurosurgery;  Laterality: N/A;   PROCTECTOMY       Social History:   reports that she has never smoked. She has never used  smokeless tobacco. She reports that she does not drink alcohol and does not use drugs.   Family History:  Her family history includes Breast cancer in her sister; Heart disease in an other family member; Hypertension in an other family member.   Allergies No Known Allergies   Home Medications  Prior to Admission medications   Medication Sig Start Date End Date Taking? Authorizing Provider  ASPIRIN 81 PO Take 1 tablet by mouth daily.   Yes [provider]  ezetimibe-simvastatin (VYTORIN) 10-20 MG tablet Take 1 tablet by mouth daily.   Yes [provider]  Multiple Vitamins-Minerals (MULTIVITAMIN,TX-MINERALS) tablet Take 1 tablet by mouth daily.   Yes [provider]  SYNTHROID 50 MCG tablet Take 75 mcg a day Mon-Fri and 50 mcg a day on Sat and Sun Patient taking differently: Take 50 mcg by mouth daily before breakfast. 12/10/20  Yes Plotnikov, Evie Lacks, MD     Critical care time:  35 min     Erick Colace ACNP-BC [place] Pager # [phone] OR # [phone] if no answer

## 2021-03-29 NOTE — Progress Notes (Signed)
Examined pt in ICU Lactate appears to be trending down per verbal report from RN.  Abd soft, bloody out put from ostomy, no rebound/guarding on exam  Will con't to trend labs and serial abd exams.

## 2021-03-29 NOTE — Progress Notes (Signed)
ANTICOAGULATION CONSULT NOTE   Pharmacy Consult for heparin Indication: atrial fibrillation  No Known Allergies  Patient Measurements: Height: 5' 7"  (170.2 cm) Weight: 68 kg (150 lb) IBW/kg (Calculated) : 61.6 Heparin Dosing Weight: 68 kg   Vital Signs: Temp: 97.5 F (36.4 C) (08/15 2036) BP: 124/79 (08/15 1900) Pulse Rate: 115 (08/15 2036)  Labs: Recent Labs    03/28/21 2300 03/28/21 2356 03/29/21 0156 03/29/21 0235 03/29/21 1002 03/29/21 1217 03/29/21 1524 03/29/21 1541 03/29/21 2145  HGB 13.4 14.6  --  13.1   < >  --  13.8 13.9 14.8  HCT 40.5 43.0  --  39.0   < >  --  40.4 41.0 44.3  PLT 287  --   --  274  --   --  276  --  297  APTT 27  --   --   --   --   --   --   --   --   LABPROT 13.7  --   --   --   --   --   --   --   --   INR 1.0  --   --   --   --   --   --   --   --   HEPARINUNFRC  --   --   --   --   --   --   --   --  0.85*  CREATININE 0.86 0.80  --   --   --  0.76  --   --   --   TROPONINIHS 9  --  9  --   --   --   --   --   --    < > = values in this interval not displayed.     Estimated Creatinine Clearance: 50.9 mL/min (by C-G formula based on SCr of 0.76 mg/dL).   Medical History: Past Medical History:  Diagnosis Date   COVID 12/18/2020   mild   HTN (hypertension)    Normal at home   Hyperlipidemia    Hypothyroidism    Ileostomy in place [facility])    Memory loss    mild but not dx by doctor per Neice   Meniere disease 1996   Osteoporosis    UC (ulcerative colitis) ([place])     Medications:  Scheduled:   aspirin EC  81 mg Oral Daily   chlorhexidine gluconate (MEDLINE KIT)  15 mL Mouth Rinse BID   Chlorhexidine Gluconate Cloth  6 each Topical Daily   [START ON 04/01/2021] levothyroxine  25 mcg Intravenous Daily   mouth rinse  15 mL Mouth Rinse 10 times per day   metoprolol tartrate  2.5 mg Intravenous Q6H   [START ON 04/01/2021] pantoprazole  40 mg Intravenous Q12H   [START ON 03/30/2021] sodium chloride flush  3 mL Intravenous Q12H     Assessment: 86 yof 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 Now s/p mechanical thrombectomy on 8/15. No AC PTA.  Hgb 13.1, plt 274. Trop 9. Okay per vascular to start heparin infusion at 12 on 8/15.   Heparin level high at 0.85  Goal of Therapy:  Heparin level 0.3-0.7 units/ml Monitor platelets by anticoagulation protocol: Yes   Plan:  Decrease heparin infusion to 850 units/hr  Order heparin level 8 hour after start Monitor daily HL, CBC, and for s/sx of bleeding   Alanda Slim, PharmD, [facility] Clinical Pharmacist Please see AMION for all Pharmacists' Contact  Phone Numbers 03/29/2021, 10:52 PM

## 2021-03-29 NOTE — ED Notes (Signed)
Patient was placed on 3L [place] due to pulse ox reading 87-88% on room air.

## 2021-03-29 NOTE — ED Notes (Signed)
[facility] is here. Verbal order to give 38mg of fentanyl from Dr KHal Hope

## 2021-03-29 NOTE — ED Provider Notes (Signed)
Patient seen in conjunction with Prosperi, PA-C.  Patient here with abdominal pain and some bleeding from her colostomy.  She states pain started earlier today.  She reports some associated nausea and vomiting.  She is afebrile.  Labs and imaging have been ordered.  Initial CBC is notable for mild leukocytosis to 12.5, with normal hemoglobin.  Lactic is 1.2.  Will check CT angio GI bleed protocol given her bleeding into her colostomy bag.  We are notified by the RN that the patient is still having significant pain in the abdomen.  Patient seen by and discussed with Dr. Betsey Holiday.  6415 CT abdomen/pelvis shows occlusion of the proximal mesenteric artery, but with some distal reconstitution.  Radiology tells me that they do not have anything to compare this to, but believe it to be chronic.  Given reassuring lactic acid of 1.2, I suspect that it is chronic.  Patient still having significant pain, discussed case again with Dr. Betsey Holiday, we will repeat lactic acid.   4:32 AM Repeat lactic is 8.4.  This now makes me very concerned for mesenteric ischemia.  Vascular consult pending.  Case discussed with Dr. Trula Slade from vascular surgery, who recommends emergent ED to ED transfer.  Patient will go to the OR upon arrival to [facility], ED.  Case discussed with Dr. Christy Gentles, who accepts ED to ED transfer.   CRITICAL CARE Performed by: Montine Circle   Total critical care time: 37 minutes  Critical care time was exclusive of separately billable procedures and treating other patients.  Critical care was necessary to treat or prevent imminent or life-threatening deterioration.  Critical care was time spent personally by me on the following activities: development of treatment plan with patient and/or surrogate as well as nursing, discussions with consultants, evaluation of patient's response to treatment, examination of patient, obtaining history from patient or surrogate, ordering and performing  treatments and interventions, ordering and review of laboratory studies, ordering and review of radiographic studies, pulse oximetry and re-evaluation of patient's condition.    Montine Circle, PA-C 03/29/21 0502    Orpah Greek, MD 03/29/21 [phone]

## 2021-03-29 NOTE — Anesthesia Procedure Notes (Addendum)
Procedure Name: Intubation Date/Time: 03/29/2021 7:02 AM Performed by: Reece Agar, CRNA Pre-anesthesia Checklist: Patient identified, Emergency Drugs available, Suction available and Patient being monitored Patient Re-evaluated:Patient Re-evaluated prior to induction Oxygen Delivery Method: Circle System Utilized Preoxygenation: Pre-oxygenation with 100% oxygen Induction Type: IV induction, Rapid sequence and Cricoid Pressure applied Laryngoscope Size: Miller and 2 Grade View: Grade II Tube type: Oral Tube size: 7.0 mm Number of attempts: 2 Airway Equipment and Method: Stylet and Oral airway Placement Confirmation: ETT inserted through vocal cords under direct vision, positive ETCO2 and breath sounds checked- equal and bilateral Secured at: 23 cm Tube secured with: Tape Dental Injury: Teeth and Oropharynx as per pre-operative assessment  Comments: DL x1 Gr 3 view A [place], DL x1 by Ermalene Postin Gr 2 view ETT passed thru cords under direct vision +BBS +[id number]

## 2021-03-29 NOTE — Consult Note (Signed)
[facility] [place]  Patient name: Jasmine Mccormick MRN: [id number] DOB: 1933-08-18 Sex: female   REQUESTING PROVIDER:   ER   REASON FOR CONSULT:    Abdominal pain  HISTORY OF PRESENT ILLNESS:   Jasmine Mccormick is a 87 y.o. female, who presented to the emergency department on 03/28/2021 at approximately 10 PM with complaints of abdominal pain.  She was found to possibly have blood in her ostomy bag.  She has a history of ulcerative colitis, and underwent colectomy with end ileostomy approximately 25 years ago.  She states that she began having abdominal pain intermittently approximately 2 months ago with changes in her output from her ileostomy bag.  This evening she developed severe abdominal pain and was reportedly noted to have some blood in her ostomy bag.  She was taken to the emergency department for further evaluation.  The patient is a medically managed for hypertension.  She is on a statin for hypercholesterolemia.  She is a non-smoker she had COVID back in May.  PAST MEDICAL HISTORY    Past Medical History:  Diagnosis Date   COVID 12/18/2020   mild   HTN (hypertension)    Normal at home   Hyperlipidemia    Hypothyroidism    Ileostomy in place [facility])    Memory loss    mild but not dx by doctor per Neice   Meniere disease 1996   Osteoporosis    UC (ulcerative colitis) ([place])      FAMILY HISTORY   Family History  Problem Relation Age of Onset   Hypertension Other    Heart disease Other    Breast cancer Sister     SOCIAL HISTORY:   Social History   Socioeconomic History   Marital status: Widowed    Spouse name: Not on file   Number of children: Not on file   Years of education: 12   Highest education level: Not on file  Occupational History   Occupation: Retired  Tobacco Use   Smoking status: Never   Smokeless tobacco: Never  Vaping Use   Vaping Use: Never used  Substance and Sexual  Activity   Alcohol use: No   Drug use: No   Sexual activity: Not Currently    Birth control/protection: Post-menopausal  Other Topics Concern   Not on file  Social History Narrative   Not on file   Social Determinants of Health   Financial Resource Strain: Low Risk    Difficulty of Paying Living Expenses: Not hard at all  Food Insecurity: No Food Insecurity   Worried About Charity fundraiser in the Last Year: Never true   [place] in the Last Year: Never true  Transportation Needs: No Transportation Needs   Lack of Transportation (Medical): No   Lack of Transportation (Non-Medical): No  Physical Activity: Inactive   Days of Exercise per Week: 0 days   Minutes of Exercise per Session: 0 min  Stress: No Stress Concern Present   Feeling of Stress : Not at all  Social Connections: Moderately Integrated   Frequency of Communication with Friends and Family: More than three times a week   Frequency of Social Gatherings with Friends and Family: More than three times a week   Attends Religious Services: More than 4 times per year   Active Member of [employer] or Organizations: Yes   Attends Archivist Meetings: More than 4 times per year   Marital Status: Widowed  Intimate Partner Violence: Not At Risk   Fear of Current or Ex-Partner: No   Emotionally Abused: No   Physically Abused: No   Sexually Abused: No    ALLERGIES:    No Known Allergies  CURRENT MEDICATIONS:    Current Facility-Administered Medications  Medication Dose Route Frequency Provider Last Rate Last Admin   fentaNYL (SUBLIMAZE) injection 12.5-50 mcg  12.5-50 mcg Intravenous Q2H PRN Rise Patience, MD   50 mcg at 03/29/21 0357   labetalol (NORMODYNE) injection 10 mg  10 mg Intravenous Q2H PRN Rise Patience, MD   10 mg at 03/29/21 9191   lactated ringers infusion   Intravenous Continuous Rise Patience, MD 75 mL/hr at 03/29/21 [place] at 03/29/21 0234   [START ON 04/01/2021]  pantoprazole (PROTONIX) injection 40 mg  40 mg Intravenous Q12H Rise Patience, MD       pantoprozole (PROTONIX) 80 mg /NS 100 mL infusion  8 mg/hr Intravenous Continuous Rise Patience, MD 10 mL/hr at 03/29/21 0008 8 mg/hr at 03/29/21 0008   piperacillin-tazobactam (ZOSYN) IVPB 3.375 g  3.375 g Intravenous Q8H Angela Adam, RPH 12.5 mL/hr at 03/29/21 0454 3.375 g at 03/29/21 0454   promethazine (PHENERGAN) 12.5 mg in sodium chloride 0.9 % 50 mL IVPB  12.5 mg Intravenous Q6H PRN Montine Circle, PA-C   Stopped at 03/29/21 0303   Current Outpatient Medications  Medication Sig Dispense Refill   ASPIRIN 81 PO Take 1 tablet by mouth daily.     ezetimibe-simvastatin (VYTORIN) 10-20 MG tablet Take 1 tablet by mouth daily.     Multiple Vitamins-Minerals (MULTIVITAMIN,TX-MINERALS) tablet Take 1 tablet by mouth daily.     SYNTHROID 50 MCG tablet Take 75 mcg a day Mon-Fri and 50 mcg a day on Sat and Sun (Patient taking differently: Take 50 mcg by mouth daily before breakfast.) 90 tablet 3    REVIEW OF SYSTEMS:   [X]  denotes positive finding, [ ]  denotes negative finding Cardiac  Comments:  Chest pain or chest pressure:    Shortness of breath upon exertion:    Short of breath when lying flat:    Irregular heart rhythm:        Vascular    Pain in calf, thigh, or hip brought on by ambulation:    Pain in feet at night that wakes you up from your sleep:     Blood clot in your veins:    Leg swelling:         Pulmonary    Oxygen at home:    Productive cough:     Wheezing:         Neurologic    Sudden weakness in arms or legs:     Sudden numbness in arms or legs:     Sudden onset of difficulty speaking or slurred speech:    Temporary loss of vision in one eye:     Problems with dizziness:         Gastrointestinal    Blood in stool:  x    Vomited blood:         Genitourinary    Burning when urinating:     Blood in urine:        Psychiatric    Major depression:          Hematologic    Bleeding problems:    Problems with blood clotting too easily:        Skin    Rashes or ulcers:  Constitutional    Fever or chills:     PHYSICAL EXAM:   Vitals:   03/29/21 0300 03/29/21 0330 03/29/21 0400 03/29/21 0500  BP: (!) 214/99 (!) 182/104 (!) 188/105 (!) 222/123  Pulse: 80 88 91 (!) 58  Resp: (!) 22 17 (!) 21 (!) 21  Temp:      TempSrc:      SpO2: 95% 98% 97% 99%  Weight:      Height:        GENERAL: The patient is a well-nourished female, in no acute distress. The vital signs are documented above. CARDIAC: Irregular rhythm VASCULAR: Palpable pedal pulses PULMONARY: Nonlabored respirations ABDOMEN: Soft but diffusley tender MUSCULOSKELETAL: There are no major deformities or cyanosis. NEUROLOGIC: No focal weakness or paresthesias are detected. SKIN: There are no ulcers or rashes noted. PSYCHIATRIC: The patient has a normal affect.  STUDIES:   I have reviewed her CT scan with the following findings: VASCULAR   1. 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 2. Moderate to marked severity diffuse calcification and atherosclerosis throughout the remaining arterial structures within the abdomen and pelvis.   NON-VASCULAR   1. 2 mm nonobstructing left renal stone. 2. Postoperative changes with a subsequent right lower quadrant ostomy site. 3. Large hiatal hernia. ASSESSMENT and PLAN   Mesenteric ischemia: The patient has been having vague complaints of abdominal pain for approximately 2 months with an acute change this evening.  She had a CT scan that shows a mid segment SMA occlusion with distal reconstitution.  Incidentally, she was found to be in atrial fibrillation which appears to be a new diagnosis for her.  I discussed with the patient and her niece who is her guardian that this is a life-threatening situation and needs to be addressed surgically.  We discussed proceeding with an attempt at  percutaneous revascularization.  If this is not successful, she will require exploratory laparotomy and thromboembolectomy.  I did discuss the possibility of bowel resection.  They understand that we need to proceed emergently.  I have spoken with general surgery who is also seen the patient this morning.   Leia Alf, MD, FACS [facility] [phone] Pager [phone]

## 2021-03-29 NOTE — ED Notes (Signed)
Report given to [facility]. 

## 2021-03-29 NOTE — ED Notes (Signed)
Hospitalist at bedside. Stated he would increase pain medication. Patient still complains of abdominal pain.

## 2021-03-29 NOTE — H&P (Signed)
History and Physical    Jasmine Mccormick:[id number] DOB: 1934-06-05 DOA: 03/28/2021  PCP: Cassandria Anger, MD  Patient coming from: Home.  Chief Complaint: Abdominal pain.  HPI: Jasmine Mccormick is a 87 y.o. female with history of ulcerative colitis status post colectomy with ileostomy done more than 20 years ago with history of hypertension and hypothyroidism presents to the ER with sudden onset of abdominal pain and increased bloody stools through the ileostomy bag since 8 PM last night.  Patient's last food was around 4 PM.  Patient's pain is mostly around the epigastric area going to the back and chest.  Denies any nausea vomiting did not have any fever chills.  ED Course: In the ER patient labs show creatinine 1.8 high sensitive troponin of 9 EKG shows normal sinus rhythm with nonspecific changes.  AST 30 ALT 19 WBC count of 12.5 hemoglobin 13.4.  Patient was empirically started on Protonix infusion.  CT scan of the abdomen pelvis was done which shows occlusion of the distal superior mesenteric artery with reconstitution.  Repeat lactic acid came as 8.4 WBC count repeated was 22.1.  Review of Systems: As per HPI, rest all negative.   Past Medical History:  Diagnosis Date   COVID 12/18/2020   mild   HTN (hypertension)    Normal at home   Hyperlipidemia    Hypothyroidism    Ileostomy in place [facility])    Memory loss    mild but not dx by doctor per [facility]   Meniere disease 1996   Osteoporosis    UC (ulcerative colitis) ([place])     Past Surgical History:  Procedure Laterality Date   COLECTOMY     COLONOSCOPY     Ear Shunt for Menier's     ILEOSTOMY     KYPHOPLASTY N/A 01/07/2021   Procedure: Lumbar three Kyphoplasty;  Surgeon: Dawley, Theodoro Doing, DO;  Location: [facility];  Service: Neurosurgery;  Laterality: N/A;   PROCTECTOMY       reports that she has never smoked. She has never used smokeless tobacco. She reports that she does not drink alcohol and does not use  drugs.  No Known Allergies  Family History  Problem Relation Age of Onset   Hypertension Other    Heart disease Other    Breast cancer Sister     Prior to Admission medications   Medication Sig Start Date End Date Taking? Authorizing Provider  ASPIRIN 81 PO Take 1 tablet by mouth daily.    [provider]  ezetimibe-simvastatin (VYTORIN) 10-20 MG tablet Take 1 tablet by mouth daily.    [provider]  metoprolol succinate (TOPROL-XL) 25 MG 24 hr tablet TAKE 1 TABLET BY MOUTH AT BEDTIME 03/22/21   Plotnikov, Evie Lacks, MD  Multiple Vitamins-Minerals (MULTIVITAMIN,TX-MINERALS) tablet Take 1 tablet by mouth daily.    [provider]  SYNTHROID 50 MCG tablet Take 75 mcg a day Mon-Fri and 50 mcg a day on Sat and Sun Patient taking differently: Take 50 mcg by mouth daily before breakfast. 12/10/20   Plotnikov, Evie Lacks, MD  verapamil (CALAN) 80 MG tablet Take 1 tablet (80 mg total) by mouth 2 (two) times daily. 02/26/20   Plotnikov, Evie Lacks, MD    Physical Exam: Constitutional: Moderately built and nourished. Vitals:   03/28/21 2300 03/28/21 2330 03/29/21 0000 03/29/21 0100  BP: (!) 201/79 (!) 205/87 (!) 198/85 (!) 202/92  Pulse: 66 72 70 75  Resp: 16 17 (!) 23 13  Temp:      TempSrc:      SpO2: 96% 95% 98% 97%  Weight:      Height:       Eyes: Anicteric no pallor. ENMT: No discharge from the ears eyes nose and mouth. Neck: No mass felt.  No neck rigidity. Respiratory: No rhonchi or crepitations. Cardiovascular: S1-S2 heard. Abdomen: Soft epigastric tenderness no guarding or rigidity.  Urostomy seen. Musculoskeletal: No edema. Skin: No rash. Neurologic: Patient is alert awake but mildly confused with the pain medication.  Moving all extremities. Psychiatric: Mildly confused from the pain medication.   Labs on Admission: I have personally reviewed following labs and imaging studies  CBC: Recent Labs  Lab 03/28/21 2300 03/28/21 2356  WBC 12.5*   --   NEUTROABS 10.2*  --   HGB 13.4 14.6  HCT 40.5 43.0  MCV 96.9  --   PLT 287  --    Basic Metabolic Panel: Recent Labs  Lab 03/28/21 2300 03/28/21 2356  NA 136 135  K 4.0 4.4  CL 102 101  CO2 27  --   GLUCOSE 146* 151*  BUN 22 23  CREATININE 0.86 0.80  CALCIUM 9.6  --    GFR: Estimated Creatinine Clearance: 50.9 mL/min (by C-G formula based on SCr of 0.8 mg/dL). Liver Function Tests: Recent Labs  Lab 03/28/21 2300  AST 30  ALT 19  ALKPHOS 59  BILITOT 0.5  PROT 7.0  ALBUMIN 4.2   Recent Labs  Lab 03/28/21 2300  LIPASE 50   No results for input(s): AMMONIA in the last 168 hours. Coagulation Profile: Recent Labs  Lab 03/28/21 2300  INR 1.0   Cardiac Enzymes: No results for input(s): CKTOTAL, CKMB, CKMBINDEX, TROPONINI in the last 168 hours. BNP (last 3 results) No results for input(s): PROBNP in the last 8760 hours. HbA1C: No results for input(s): HGBA1C in the last 72 hours. CBG: No results for input(s): GLUCAP in the last 168 hours. Lipid Profile: No results for input(s): CHOL, HDL, LDLCALC, TRIG, CHOLHDL, LDLDIRECT in the last 72 hours. Thyroid Function Tests: No results for input(s): TSH, T4TOTAL, FREET4, T3FREE, THYROIDAB in the last 72 hours. Anemia Panel: No results for input(s): VITAMINB12, FOLATE, FERRITIN, TIBC, IRON, RETICCTPCT in the last 72 hours. Urine analysis:    Component Value Date/Time   COLORURINE YELLOW 01/28/2021 1237   APPEARANCEUR Sl Cloudy (A) 01/28/2021 1237   LABSPEC 1.010 01/28/2021 1237   PHURINE 6.5 01/28/2021 1237   GLUCOSEU NEGATIVE 01/28/2021 1237   HGBUR NEGATIVE 01/28/2021 1237   BILIRUBINUR NEGATIVE 01/28/2021 1237   BILIRUBINUR neg 02/20/2020 1316   KETONESUR NEGATIVE 01/28/2021 1237   PROTEINUR Positive (A) 02/20/2020 1316   UROBILINOGEN 0.2 01/28/2021 1237   NITRITE NEGATIVE 01/28/2021 1237   LEUKOCYTESUR TRACE (A) 01/28/2021 1237   Sepsis Labs: @LABRCNTIP (procalcitonin:4,lacticidven:4) )No results  found for this or any previous visit (from the past 240 hour(s)).   Radiological Exams on Admission: DG Chest Portable 1 View  Result Date: 03/28/2021 CLINICAL DATA:  Pleuritic chest pain. EXAM: PORTABLE CHEST 1 VIEW COMPARISON:  None. FINDINGS: Significant patient rotation limits assessment. Heart size is grossly normal. There is aortic atherosclerosis. Borderline hyperinflation with minimal bronchial thickening. No confluent airspace disease. Limited assessment of the left lung base due to overlapping soft tissue structures. No pneumothorax. Limited assessment for pleural effusion due to positioning. No pulmonary edema. Degenerative change of both shoulders. No acute osseous abnormalities are seen. IMPRESSION: Significant patient rotation limits assessment. Borderline hyperinflation and minimal  bronchial thickening. Electronically Signed   By: Keith Rake M.D.   On: 03/28/2021 23:54   CT Angio Abd/Pel W and/or Wo Contrast  Result Date: 03/29/2021 CLINICAL DATA:  Abdominal pain radiating to the chest. EXAM: CTA ABDOMEN AND PELVIS WITHOUT AND WITH CONTRAST TECHNIQUE: Multidetector CT imaging of the abdomen and pelvis was performed using the standard protocol during bolus administration of intravenous contrast. Multiplanar reconstructed images and MIPs were obtained and reviewed to evaluate the vascular anatomy. CONTRAST:  68m OMNIPAQUE IOHEXOL 350 MG/ML SOLN COMPARISON:  None. FINDINGS: VASCULAR Aorta: Moderate to marked severity calcification of a caliber aorta without aneurysm, dissection, vasculitis or significant stenosis. Celiac: Patent without evidence of aneurysm, dissection, vasculitis or significant stenosis. SMA: Occlusion of the distal aspect of the superior mesenteric artery is seen (axial CT images 62 through 73, CT series 6). Enhancement of 1 of its distal branches is present. Renals: Both renal arteries are patent without evidence of aneurysm, dissection, vasculitis, fibromuscular  dysplasia or significant stenosis. IMA: Not clearly visualized. Inflow: Moderate to marked severity diffuse calcification without evidence of aneurysm, dissection, vasculitis or significant stenosis. Proximal Outflow: Bilateral common femoral and visualized portions of the superficial and profunda femoral arteries are patent without evidence of aneurysm, dissection, vasculitis or significant stenosis. Veins: No obvious venous abnormality within the limitations of this arterial phase study. Review of the MIP images confirms the above findings. NON-VASCULAR Lower chest: Mild to moderate severity scarring and/or atelectasis is seen along the posterior aspect of the bilateral lung bases. Hepatobiliary: A punctate calcified granuloma is seen within the anterior aspect of the right lobe of the liver. The gallbladder is not clearly identified and may be partially contracted. Pancreas: Unremarkable. No pancreatic ductal dilatation or surrounding inflammatory changes. Spleen: Normal in size without focal abnormality. Adrenals/Urinary Tract: Adrenal glands are unremarkable. Kidneys are normal in size, without obstructing renal calculi or hydronephrosis. Cortical scarring is seen along the lateral aspect of the mid left kidney. An adjacent 1.7 cm diameter cystic appearing area is noted. A 2 mm nonobstructing renal stone is seen within the mid to upper left kidney. Bladder is unremarkable. Stomach/Bowel: There is a large hiatal hernia. Appendix appears normal. No evidence of bowel wall thickening, distention, or inflammatory changes. Lymphatic: No abnormal abdominal or pelvic lymph nodes are identified. Reproductive: Uterus and bilateral adnexa are unremarkable. Other: A right lower quadrant ostomy site is seen. No abdominopelvic ascites. Musculoskeletal: There is evidence of prior vertebroplasty at the level of L3. Marked severity chronic and degenerative changes are also seen at the level of L2-L3. IMPRESSION: VASCULAR 1.  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 2. Moderate to marked severity diffuse calcification and atherosclerosis throughout the remaining arterial structures within the abdomen and pelvis. NON-VASCULAR 1. 2 mm nonobstructing left renal stone. 2. Postoperative changes with a subsequent right lower quadrant ostomy site. 3. Large hiatal hernia. Electronically Signed   By: TVirgina NorfolkM.D.   On: 03/29/2021 01:35    EKG: Independently reviewed.  Normal sinus rhythm.  Assessment/Plan Principal Problem:   GI bleeding Active Problems:   Ulcerative colitis (HCC)   Hypertensive urgency   Acute GI bleeding    Abdominal pain with occlusion of distal superior mesenteric artery with possible reconstitution and GI bleed at this time concerning for possible ischemic bowel.  Discussed with Dr. BTrula Sladewill be seeing patient in consult and taking patient to the OR.  Patient be on empiric antibiotics.  Hydrate.  NPO. Hypertensive  urgency on as needed labetalol. History of hypothyroidism on Synthroid presently NPO. Chest pain I suspect is referred pain from the abdomen.  EKG does not show anything acute we will cycle cardiac markers. With history of ulcerative colitis status post colectomy presently on ileostomy.  Since patient has acute abdominal pain concerning for ischemic bowel will need inpatient status.   DVT prophylaxis: SCDs. Code Status: Full code. Family Communication: Patient's nephew at the bedside. Disposition Plan: To be determined. Consults called: Dr. Trula Slade vascular surgeon. Admission status: Inpatient.   Rise Patience MD [facility] Pager [phone].  If 7PM-7AM, please contact night-coverage [email or web address] Password [email or web address]  03/29/2021, 2:16 AM

## 2021-03-29 NOTE — ED Notes (Signed)
TO CT

## 2021-03-29 NOTE — Transfer of Care (Signed)
Immediate Anesthesia Transfer of Care Note  Patient: [name]  Procedure(s) Performed: ULTRASOUND GUIDANCE FOR VASCULAR ACCESS (Right: Groin) PERCUTANEOUS THROMBECTOMY WITH PENUMBRA (Right: Groin) MESENTERIC ARTERY THROMBECTOMY (Right: Groin) INTRA OPERATIVE AORTOGRAM (Right: Groin)  Patient Location: SICU  Anesthesia Type:General  Level of Consciousness: sedated and Patient remains intubated per anesthesia plan  Airway & Oxygen Therapy: Patient remains intubated per anesthesia plan and Patient placed on Ventilator (see vital sign flow sheet for setting)  Post-op Assessment: Report given to RN and Post -op Vital signs reviewed and stable  Post vital signs: Reviewed and stable  Last Vitals:  Vitals Value Taken Time  BP    Temp    Pulse 83 03/29/21 0936  Resp 18 03/29/21 0936  SpO2 100 % 03/29/21 0936  Vitals shown include unvalidated device data.  Last Pain:  Vitals:   03/29/21 0126  TempSrc:   PainSc: 7          Complications: No notable events documented.

## 2021-03-29 NOTE — ED Notes (Signed)
[facility] notified for pt transfer

## 2021-03-30 ENCOUNTER — Inpatient Hospital Stay (HOSPITAL_COMMUNITY): Payer: Medicare Other

## 2021-03-30 DIAGNOSIS — I4891 Unspecified atrial fibrillation: Secondary | ICD-10-CM

## 2021-03-30 DIAGNOSIS — K922 Gastrointestinal hemorrhage, unspecified: Secondary | ICD-10-CM | POA: Diagnosis not present

## 2021-03-30 DIAGNOSIS — Z9889 Other specified postprocedural states: Secondary | ICD-10-CM

## 2021-03-30 DIAGNOSIS — R1032 Left lower quadrant pain: Secondary | ICD-10-CM | POA: Diagnosis not present

## 2021-03-30 DIAGNOSIS — K55059 Acute (reversible) ischemia of intestine, part and extent unspecified: Secondary | ICD-10-CM

## 2021-03-30 DIAGNOSIS — K55069 Acute infarction of intestine, part and extent unspecified: Secondary | ICD-10-CM | POA: Diagnosis not present

## 2021-03-30 LAB — CBC
HCT: 42.1 % (ref 36.0–46.0)
HCT: 40.8 % (ref 36.0–46.0)
HCT: 41 % (ref 36.0–46.0)
Hemoglobin: 14.4 g/dL (ref 12.0–15.0)
Hemoglobin: 13.8 g/dL (ref 12.0–15.0)
Hemoglobin: 13.6 g/dL (ref 12.0–15.0)
MCH: 32.4 pg (ref 26.0–34.0)
MCH: 32.3 pg (ref 26.0–34.0)
MCH: 32.1 pg (ref 26.0–34.0)
MCHC: 34.2 g/dL (ref 30.0–36.0)
MCHC: 33.8 g/dL (ref 30.0–36.0)
MCHC: 33.2 g/dL (ref 30.0–36.0)
MCV: 94.8 fL (ref 80.0–100.0)
MCV: 95.6 fL (ref 80.0–100.0)
MCV: 96.7 fL (ref 80.0–100.0)
Platelets: 268 10*3/uL (ref 150–400)
Platelets: 251 10*3/uL (ref 150–400)
Platelets: 261 10*3/uL (ref 150–400)
RBC: 4.44 MIL/uL (ref 3.87–5.11)
RBC: 4.27 MIL/uL (ref 3.87–5.11)
RBC: 4.24 MIL/uL (ref 3.87–5.11)
RDW: 12.7 % (ref 11.5–15.5)
RDW: 12.9 % (ref 11.5–15.5)
RDW: 12.9 % (ref 11.5–15.5)
WBC: 11 10*3/uL — ABNORMAL HIGH (ref 4.0–10.5)
WBC: 13.3 10*3/uL — ABNORMAL HIGH (ref 4.0–10.5)
WBC: 12.3 10*3/uL — ABNORMAL HIGH (ref 4.0–10.5)
nRBC: 0 % (ref 0.0–0.2)
nRBC: 0 % (ref 0.0–0.2)
nRBC: 0 % (ref 0.0–0.2)

## 2021-03-30 LAB — TYPE AND SCREEN
ABO/RH(D): A POS
Antibody Screen: NEGATIVE
Unit division: 0
Unit division: 0

## 2021-03-30 LAB — COMPREHENSIVE METABOLIC PANEL
ALT: 18 U/L (ref 0–44)
ALT: 16 U/L (ref 0–44)
AST: 32 U/L (ref 15–41)
AST: 31 U/L (ref 15–41)
Albumin: 2.5 g/dL — ABNORMAL LOW (ref 3.5–5.0)
Albumin: 2.5 g/dL — ABNORMAL LOW (ref 3.5–5.0)
Alkaline Phosphatase: 36 U/L — ABNORMAL LOW (ref 38–126)
Alkaline Phosphatase: 41 U/L (ref 38–126)
Anion gap: 9 (ref 5–15)
Anion gap: 9 (ref 5–15)
BUN: 24 mg/dL — ABNORMAL HIGH (ref 8–23)
BUN: 27 mg/dL — ABNORMAL HIGH (ref 8–23)
CO2: 16 mmol/L — ABNORMAL LOW (ref 22–32)
CO2: 18 mmol/L — ABNORMAL LOW (ref 22–32)
Calcium: 7.9 mg/dL — ABNORMAL LOW (ref 8.9–10.3)
Calcium: 8 mg/dL — ABNORMAL LOW (ref 8.9–10.3)
Chloride: 108 mmol/L (ref 98–111)
Chloride: 105 mmol/L (ref 98–111)
Creatinine, Ser: 1.39 mg/dL — ABNORMAL HIGH (ref 0.44–1.00)
Creatinine, Ser: 1.35 mg/dL — ABNORMAL HIGH (ref 0.44–1.00)
GFR, Estimated: 37 mL/min — ABNORMAL LOW (ref >60)
GFR, Estimated: 39 mL/min — ABNORMAL LOW (ref >60)
Glucose, Bld: 155 mg/dL — ABNORMAL HIGH (ref 70–99)
Glucose, Bld: 145 mg/dL — ABNORMAL HIGH (ref 70–99)
Potassium: 4.7 mmol/L (ref 3.5–5.1)
Potassium: 4.3 mmol/L (ref 3.5–5.1)
Sodium: 133 mmol/L — ABNORMAL LOW (ref 135–145)
Sodium: 132 mmol/L — ABNORMAL LOW (ref 135–145)
Total Bilirubin: 0.9 mg/dL (ref 0.3–1.2)
Total Bilirubin: 1 mg/dL (ref 0.3–1.2)
Total Protein: 4.8 g/dL — ABNORMAL LOW (ref 6.5–8.1)
Total Protein: 5.3 g/dL — ABNORMAL LOW (ref 6.5–8.1)

## 2021-03-30 LAB — ECHOCARDIOGRAM COMPLETE
AR max vel: 2.58 cm2
AV Area VTI: 2.43 cm2
AV Area mean vel: 2.43 cm2
AV Mean grad: 3 mmHg
AV Peak grad: 5.4 mmHg
Ao pk vel: 1.16 m/s
Calc EF: 60.7 %
Height: 67 in
S' Lateral: 1.8 cm
Single Plane A2C EF: 68.3 %
Single Plane A4C EF: 55 %
Weight: 2400 oz

## 2021-03-30 LAB — GLUCOSE, CAPILLARY: Glucose-Capillary: 135 mg/dL — ABNORMAL HIGH (ref 70–99)

## 2021-03-30 LAB — POCT I-STAT 7, (LYTES, BLD GAS, ICA,H+H)
Acid-base deficit: 8 mmol/L — ABNORMAL HIGH (ref 0.0–2.0)
Acid-base deficit: 9 mmol/L — ABNORMAL HIGH (ref 0.0–2.0)
Acid-base deficit: 7 mmol/L — ABNORMAL HIGH (ref 0.0–2.0)
Bicarbonate: 18.2 mmol/L — ABNORMAL LOW (ref 20.0–28.0)
Bicarbonate: 19.2 mmol/L — ABNORMAL LOW (ref 20.0–28.0)
Bicarbonate: 15.9 mmol/L — ABNORMAL LOW (ref 20.0–28.0)
Calcium, Ion: 1.12 mmol/L — ABNORMAL LOW (ref 1.15–1.40)
Calcium, Ion: 1.16 mmol/L (ref 1.15–1.40)
Calcium, Ion: 1.12 mmol/L — ABNORMAL LOW (ref 1.15–1.40)
HCT: 35 % — ABNORMAL LOW (ref 36.0–46.0)
HCT: 36 % (ref 36.0–46.0)
HCT: 42 % (ref 36.0–46.0)
Hemoglobin: 11.9 g/dL — ABNORMAL LOW (ref 12.0–15.0)
Hemoglobin: 12.2 g/dL (ref 12.0–15.0)
Hemoglobin: 14.3 g/dL (ref 12.0–15.0)
O2 Saturation: 95 %
O2 Saturation: 96 %
O2 Saturation: 99 %
Patient temperature: 32.7
Patient temperature: 35.7
Potassium: 4.3 mmol/L (ref 3.5–5.1)
Potassium: 4.4 mmol/L (ref 3.5–5.1)
Potassium: 4.8 mmol/L (ref 3.5–5.1)
Sodium: 136 mmol/L (ref 135–145)
Sodium: 136 mmol/L (ref 135–145)
Sodium: 136 mmol/L (ref 135–145)
TCO2: 20 mmol/L — ABNORMAL LOW (ref 22–32)
TCO2: 20 mmol/L — ABNORMAL LOW (ref 22–32)
TCO2: 17 mmol/L — ABNORMAL LOW (ref 22–32)
pCO2 arterial: 37.4 mmHg (ref 32.0–48.0)
pCO2 arterial: 43 mmHg (ref 32.0–48.0)
pCO2 arterial: 24.4 mmHg — ABNORMAL LOW (ref 32.0–48.0)
pH, Arterial: 7.258 — ABNORMAL LOW (ref 7.350–7.450)
pH, Arterial: 7.272 — ABNORMAL LOW (ref 7.350–7.450)
pH, Arterial: 7.416 (ref 7.350–7.450)
pO2, Arterial: 70 mmHg — ABNORMAL LOW (ref 83.0–108.0)
pO2, Arterial: 98 mmHg (ref 83.0–108.0)
pO2, Arterial: 117 mmHg — ABNORMAL HIGH (ref 83.0–108.0)

## 2021-03-30 LAB — HEPARIN LEVEL (UNFRACTIONATED)
Heparin Unfractionated: 0.68 IU/mL (ref 0.30–0.70)
Heparin Unfractionated: 0.27 IU/mL — ABNORMAL LOW (ref 0.30–0.70)

## 2021-03-30 LAB — BPAM RBC
Blood Product Expiration Date: 202209072359
Blood Product Expiration Date: 202209072359
Unit Type and Rh: 6200
Unit Type and Rh: 6200

## 2021-03-30 LAB — TRIGLYCERIDES: Triglycerides: 52 mg/dL (ref <150)

## 2021-03-30 LAB — POCT ACTIVATED CLOTTING TIME: Activated Clotting Time: 248 seconds

## 2021-03-30 LAB — SURGICAL PATHOLOGY

## 2021-03-30 MED ORDER — WHITE PETROLATUM EX OINT
TOPICAL_OINTMENT | CUTANEOUS | Status: AC
  Filled 2021-03-30: qty 28.35

## 2021-03-30 MED ORDER — LACTATED RINGERS IV SOLN: INTRAVENOUS | Status: AC

## 2021-03-30 NOTE — Progress Notes (Signed)
NAME:  Jasmine Mccormick, MRN:  [id number], DOB:  [birth year], LOS: 1 ADMISSION DATE:  03/28/2021, CONSULTATION DATE:  03/29/2021 REFERRING MD:  Trula Slade, CHIEF COMPLAINT:  Critical care services and vent management    History of Present Illness:  This is an 87 year old female with history as mentioned below who presented to the emergency room On 8/14 with chief complaint of abdominal pain, as well as dark-colored frank blood in her ileostomy drainage bag.  Significant findings in the emergency room showed new acute renal failure, mild leukocytosis with white blood cell count of 12.5 rising to 22.1, and CT angiogram of abdomen and pelvis showing occlusion of distal superior mesenteric artery with reconstitution.  Her initial lactate was 8.4.  She was given IV fluids, empiric antibiotics initiated, IV Protonix drip initiated, and both general surgery and vascular surgery were consulted.  She was brought to the operating room on 8/15 where she underwent superior mesenteric artery angiogram with successful mechanical thrombectomy of superior mesenteric artery, intraoperatively did have atrial fibrillation with RVR, which responded to labetalol, subsequently had hypotension requiring phenylephrine infusion.  Because of her preoperative acidosis, she was left intubated to ensure optimal respiratory compensation, and critical care asked to assist with her management.  Pertinent  Medical History  Ulcerative colitis, prior colectomy with ileostomy done about 20 years prior.  Hypertension, hypothyroidism.  COVID back in May 2022.  Hyperlipidemia.  Significant Hospital Events: Including procedures, antibiotic start and stop dates in addition to other pertinent events   8/14 presented to the emergency room with chief complaint of abdominal pain, and bloody output from ileostomy bag.  CT angiogram of abdomen pelvis showed occlusion of distal superior mesenteric artery 8/15 seen by both general surgery as well as  vascular surgery.  Started on IV Zosyn for leukocytosis and concern about bacterial translocation from gut ischemia.  Intubated.  Went to the OR with vascular surgery where she underwent successful mechanical thrombectomy of superior mesenteric artery thrombus.  Briefly on IV Protonix, this was changed to every 12 dosing postoperatively.  Critical care asked to assist with management 8/16: extubated without complication.   Interim History / Subjective:  Requiring no pressors this AM. Sedation discontinued. Extubated. Awake and alert. Neck ache and thirsty this AM. Denies any other pain, nausea, or difficulty breathing.  Objective   Blood pressure 125/84, pulse (!) 114, temperature 100 F (37.8 C), resp. rate (!) 22, height 5' 7"  (1.702 m), weight 68 kg, SpO2 98 %.    Vent Mode: PSV;CPAP FiO2 (%):  [40 %-100 %] 40 % Set Rate:  [14 bmp-24 bmp] 22 bmp Vt Set:  [490 mL] 490 mL PEEP:  [5 cmH20-8 cmH20] 5 cmH20 Pressure Support:  [8 cmH20] 8 cmH20 Plateau Pressure:  [13 cmH20-18 cmH20] 15 cmH20   Intake/Output Summary (Last 24 hours) at 03/30/2021 0759 Last data filed at 03/30/2021 5329 Gross per 24 hour  Intake 3663.06 ml  Output 3880 ml  Net -216.94 ml   Filed Weights   03/28/21 2231 03/28/21 2243  Weight: 72.6 kg 68 kg    Examination: General: thin elderly female in bed in no acute distress  HENT: normocephalic, atraumatic, dry mucus membranes Lungs: CTA bilaterally, no increased WOB, on 3 L nasal cannula Cardiovascular: RRR, no M/R/G Abdomen: Non tender, non distended, normoactive bowel sounds Extremities: warm, dry , well perfused Neuro: awake, alert and oriented to person, place, and situation. 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 Lactic acid down trended and stable around 3 last night. Hgb stable around 14. Increased WBC at 11 today, possibly reactive in setting of ischemia and surgery v infectious source.  - IV heparin per vascular surg - PPI Q12H - daily CBC  - PRN analgesia    SIRS w/ severe lactic acidosis in setting of ischemic bowel.  Can't exclude bacterial translocation  Lactic acidosis resolved, improved vitals. Per general surgery low concern for bowel ischemia at this time and no need for further antibiotics from their standpoint. Deferred to vascular for further antibiotic management.  - Cont IV hydration - Tele  - Day 2 zosyn; will defer to vascular for duration   AKI At risk for worsening injury s/p intra-op hypotension, dye load etc Worsening creatinine today at 1.39 from 0.76 yesterday. Concern for possible renal artery blockage in setting of recent surgery.  However, good UOP of 1655 mL over last 24 hours. Low concern for physical damage/bleed from renal artery with stable hgb.  - IV hydration - Keep foley - trend BMP - Renal dose meds - consider renal arteriogram if continued worsening; already on anticoagulation if blockage    HTN/ hypertensive urgency - resolved Resolved after treatment of pain.  - PAD protocol - PRN hydralazine for SBP > 180   Atrial fib. Not sure if this is acute as of this illness OR chronic and not identified - Cont tele - ECHO - Heparin gtt - continue metoprolol    Need for mechanical ventilation.  Intubated for surgery. Kept intubated/ventilated to ensure adequate respiratory compensation for underlying metabolic acidosis. Extubated this morning without complication.    H/o hypothyroidism  Apparently has not been taking her meds per report d/t pain - IV synthroid (1/2 her home dose)   Mild hyperglycemia - Ssi  - Goal 140-180   Best Practice (right click and "Reselect  all SmartList Selections" daily)   Diet/type: Regular consistency (see orders) DVT prophylaxis: systemic heparin GI prophylaxis: PPI Lines: Central line Foley:  Yes, and it is still needed Code Status:  full code Last date of multidisciplinary goals of care discussion [per primary]  Labs   CBC: Recent Labs  Lab 03/28/21 2300 03/28/21 2356 03/29/21 0235 03/29/21 0741 03/29/21 1524 03/29/21 1541 03/29/21 2145 03/30/21 0423 03/30/21 0435  WBC 12.5*  --  22.1*  --  10.5  --  9.4  --  11.0*  NEUTROABS 10.2*  --   --   --   --   --   --   --   --   HGB 13.4   < > 13.1   < > 13.8 13.9 14.8 14.3 14.4  HCT 40.5   < > 39.0   < > 40.4 41.0 44.3 42.0 42.1  MCV 96.9  --  95.8  --  94.0  --  95.5  --  94.8  PLT 287  --  274  --  276  --  297  --  268   < > = values in this interval not displayed.    Basic Metabolic Panel: Recent Labs  Lab 03/28/21 2300 03/28/21 2356 03/29/21 0741 03/29/21 1210 03/29/21 1217 03/29/21 1541 03/30/21 0423 03/30/21 0435  NA 136 135   < > 138 136  139 136 133*  K 4.0 4.4   < > 3.6 3.4* 4.1 4.8 4.7  CL 102 101  --   --  108  --   --  108  CO2 27  --   --   --  20*  --   --  16*  GLUCOSE 146* 151*  --   --  186*  --   --  155*  BUN 22 23  --   --  15  --   --  24*  CREATININE 0.86 0.80  --   --  0.76  --   --  1.39*  CALCIUM 9.6  --   --   --  7.5*  --   --  7.9*   < > = values in this interval not displayed.   GFR: Estimated Creatinine Clearance: 29.3 mL/min (A) (by C-G formula based on SCr of 1.39 mg/dL (H)). Recent Labs  Lab 03/28/21 2309 03/29/21 0235 03/29/21 0327 03/29/21 1524 03/29/21 2140 03/29/21 2145 03/30/21 0435  WBC  --  22.1*  --  10.5  --  9.4 11.0*  LATICACIDVEN 1.3  --  8.4* 3.0* 3.4*  --   --     Liver Function Tests: Recent Labs  Lab 03/28/21 2300 03/29/21 1217 03/30/21 0435  AST 30 47* 32  ALT 19 23 18   ALKPHOS 59 46 36*  BILITOT 0.5 0.7 0.9  PROT 7.0 5.3* 4.8*  ALBUMIN 4.2 3.1* 2.5*   Recent Labs  Lab  03/28/21 2300  LIPASE 50   No results for input(s): AMMONIA in the last 168 hours.  ABG    Component Value Date/Time   PHART 7.416 03/30/2021 0423   PCO2ART 24.4 (L) 03/30/2021 0423   PO2ART 117 (H) 03/30/2021 0423   HCO3 15.9 (L) 03/30/2021 0423   TCO2 17 (L) 03/30/2021 0423   ACIDBASEDEF 7.0 (H) 03/30/2021 0423   O2SAT 99.0 03/30/2021 0423     Coagulation Profile: Recent Labs  Lab 03/28/21 2300  INR 1.0    Cardiac Enzymes: No results for input(s): CKTOTAL, CKMB, CKMBINDEX, TROPONINI in the last 168 hours.  HbA1C: Hgb A1c MFr Bld  Date/Time Value Ref Range Status  09/17/2020 11:22 AM 6.2 4.6 - 6.5 % Final    Comment:    Glycemic Control Guidelines for People with Diabetes:Non Diabetic:  <6%Goal of Therapy: <7%Additional Action Suggested:  >8%   08/13/2013 07:28 AM 6.2 4.6 - 6.5 % Final    Comment:    Glycemic Control Guidelines for People with Diabetes:Non Diabetic:  <6%Goal of Therapy: <7%Additional Action Suggested:  >8%     CBG: Recent Labs  Lab 03/29/21 1000 03/29/21 1602  GLUCAP 178* 142*     Past Medical History:  She,  has a past medical history of COVID (12/18/2020), HTN (hypertension), Hyperlipidemia, Hypothyroidism, Ileostomy in place [facility]), Memory loss, Meniere disease (1996), Osteoporosis, and UC (ulcerative colitis) ([place]).   Surgical History:   Past Surgical History:  Procedure Laterality Date   COLECTOMY     COLONOSCOPY     Ear Shunt for Menier's     ILEOSTOMY     KYPHOPLASTY N/A 01/07/2021   Procedure: Lumbar three Kyphoplasty;  Surgeon: Dawley, Theodoro Doing, DO;  Location: [place];  Service: Neurosurgery;  Laterality: N/A;   PROCTECTOMY       Social History:   reports that she has never smoked. She has never used smokeless tobacco. She reports that she does not drink alcohol and does not use drugs.  Family History:  Her family history includes Breast cancer in her sister; Heart disease in an other family member; Hypertension in an other  family member.   Allergies No Known Allergies   Home Medications  Prior to Admission medications   Medication Sig Start Date End Date Taking? Authorizing Provider  ASPIRIN 81 PO Take 1 tablet by mouth daily.   Yes [provider]  ezetimibe-simvastatin (VYTORIN) 10-20 MG tablet Take 1 tablet by mouth daily.   Yes [provider]  Multiple Vitamins-Minerals (MULTIVITAMIN,TX-MINERALS) tablet Take 1 tablet by mouth daily.   Yes [provider]  levothyroxine (SYNTHROID) 75 MCG tablet Take 1 tablet (75 mcg total) by mouth daily. 03/29/21   Plotnikov, Evie Lacks, MD

## 2021-03-30 NOTE — Progress Notes (Addendum)
Dr. Trula Slade came to see patient over concern for left dorsalis pedis pulse no longer able to be ausculted via doppler.  Also discussed patient HR being elevated and he is aware that her HR is in the 120s will continue to monitor with no new orders at this time. Furthermore, conversation with Dr. Trula Slade about patients diet I have held diet today due to concerns for aspiration and bowel health, speech signed off for a diet for the patient however per Dr. Trula Slade, will change diet to NPO and will follow speech recommendations once diet can safely be started.

## 2021-03-30 NOTE — Anesthesia Postprocedure Evaluation (Signed)
Anesthesia Post Note  Patient: Jasmine Mccormick  Procedure(s) Performed: ULTRASOUND GUIDANCE FOR VASCULAR ACCESS (Right: Groin) PERCUTANEOUS THROMBECTOMY WITH PENUMBRA (Right: Groin) MESENTERIC ARTERY THROMBECTOMY (Right: Groin) INTRA OPERATIVE AORTOGRAM (Right: Groin)     Patient location during evaluation: SICU Anesthesia Type: General Level of consciousness: sedated Pain management: pain level controlled Vital Signs Assessment: post-procedure vital signs reviewed and stable Respiratory status: patient remains intubated per anesthesia plan Cardiovascular status: stable Postop Assessment: no apparent nausea or vomiting Anesthetic complications: no   No notable events documented.  Last Vitals:  Vitals:   03/30/21 0800 03/30/21 0900  BP: 140/84 136/81  Pulse: (!) 121 98  Resp: 19 (!) 22  Temp:    SpO2: 97% 97%    Last Pain:  Vitals:   03/30/21 0800  TempSrc:   PainSc: 0-No pain                 Merlinda Frederick

## 2021-03-30 NOTE — Progress Notes (Signed)
*  PRELIMINARY RESULTS* Echocardiogram 2D Echocardiogram has been performed.  Luisa Hart RDCS 03/30/2021, 12:54 PM

## 2021-03-30 NOTE — Progress Notes (Signed)
ANTICOAGULATION CONSULT NOTE   Pharmacy Consult for heparin Indication: atrial fibrillation  No Known Allergies  Patient Measurements: Height: 5' 7"  (170.2 cm) Weight: 68 kg (150 lb) IBW/kg (Calculated) : 61.6 Heparin Dosing Weight: 68 kg   Vital Signs: Temp: 100 F (37.8 C) (08/16 0600) Temp Source: Oral (08/16 0300) BP: 125/84 (08/16 0700) Pulse Rate: 114 (08/16 0700)  Labs: Recent Labs    03/28/21 2300 03/28/21 2356 03/29/21 0156 03/29/21 0235 03/29/21 1217 03/29/21 1524 03/29/21 1541 03/29/21 2145 03/30/21 0423 03/30/21 0435 03/30/21 0641  HGB 13.4 14.6  --    < >  --  13.8   < > 14.8 14.3 14.4  --   HCT 40.5 43.0  --    < >  --  40.4   < > 44.3 42.0 42.1  --   PLT 287  --   --    < >  --  276  --  297  --  268  --   APTT 27  --   --   --   --   --   --   --   --   --   --   LABPROT 13.7  --   --   --   --   --   --   --   --   --   --   INR 1.0  --   --   --   --   --   --   --   --   --   --   HEPARINUNFRC  --   --   --   --   --   --   --  0.85*  --   --  0.68  CREATININE 0.86 0.80  --   --  0.76  --   --   --   --  1.39*  --   TROPONINIHS 9  --  9  --   --   --   --   --   --   --   --    < > = values in this interval not displayed.     Estimated Creatinine Clearance: 29.3 mL/min (A) (by C-G formula based on SCr of 1.39 mg/dL (H)).   Medical History: Past Medical History:  Diagnosis Date   COVID 12/18/2020   mild   HTN (hypertension)    Normal at home   Hyperlipidemia    Hypothyroidism    Ileostomy in place [facility])    Memory loss    mild but not dx by doctor per Neice   Meniere disease 1996   Osteoporosis    UC (ulcerative colitis) ([place])     Medications:  Scheduled:   aspirin EC  81 mg Oral Daily   chlorhexidine gluconate (MEDLINE KIT)  15 mL Mouth Rinse BID   Chlorhexidine Gluconate Cloth  6 each Topical Daily   [START ON 04/01/2021] levothyroxine  25 mcg Intravenous Daily   mouth rinse  15 mL Mouth Rinse 10 times per day   metoprolol  tartrate  2.5 mg Intravenous Q6H   [START ON 04/01/2021] pantoprazole  40 mg Intravenous Q12H   sodium chloride flush  3 mL Intravenous Q12H    Assessment: 63 yof 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 Now s/p mechanical thrombectomy on 8/15. No AC PTA.  Heparin level came back therapeutic on higher end of goal at 0.68, on 850 units/hr. Hgb 14.4, plt 268. No  s/sx of bleeding or infusion issues.   Goal of Therapy:  Heparin level 0.3-0.7 units/ml Monitor platelets by anticoagulation protocol: Yes   Plan:  Decrease heparin infusion to 750 units/hr  Order heparin level 8 hour after start Monitor daily HL, CBC, and for s/sx of bleeding   Antonietta Jewel, PharmD, [place] Pharmacist  Phone: [phone] 03/30/2021 8:02 AM  Please check AMION for all [place] phone numbers After 10:00 PM, call [place] [phone]

## 2021-03-30 NOTE — Procedures (Signed)
Extubation Procedure Note  Patient Details:   Name: Jasmine Mccormick DOB: [birth year] MRN: [id number]   Airway Documentation:    Vent end date: 03/30/21 Vent end time: 0745   Evaluation  O2 sats: stable throughout Complications: No apparent complications Patient did tolerate procedure well. Bilateral Breath Sounds: Clear, Diminished   Yes  Pt was extubated per MD order and placed on 3 L [place]. Cuff leak noted prior to extubation and no stridor post. Pt is stable at this time. RT will monitor.   Ronaldo Miyamoto 03/30/2021, 7:48 AM

## 2021-03-30 NOTE — Evaluation (Signed)
Clinical/Bedside Swallow Evaluation Patient Details  Name: MARYKAY MCCLEOD MRN: [id number] Date of Birth: 1934-03-15  Today's Date: 03/30/2021 Time: SLP Start Time (ACUTE ONLY): 46 SLP Stop Time (ACUTE ONLY): 1320 SLP Time Calculation (min) (ACUTE ONLY): 22 min  Past Medical History:  Past Medical History:  Diagnosis Date   COVID 12/18/2020   mild   HTN (hypertension)    Normal at home   Hyperlipidemia    Hypothyroidism    Ileostomy in place [facility])    Memory loss    mild but not dx by doctor per Neice   Meniere disease 1996   Osteoporosis    UC (ulcerative colitis) ([place])    Past Surgical History:  Past Surgical History:  Procedure Laterality Date   COLECTOMY     COLONOSCOPY     Ear Shunt for Menier's     ILEOSTOMY     KYPHOPLASTY N/A 01/07/2021   Procedure: Lumbar three Kyphoplasty;  Surgeon: Dawley, Theodoro Doing, DO;  Location: [place];  Service: Neurosurgery;  Laterality: N/A;   PROCTECTOMY     HPI:  Jasmine Mccormick is a 87 y.o. female with history of ulcerative colitis status post colectomy with ileostomy done more than 20 years ago with history of hypertension and hypothyroidism presents to the ER with sudden onset of abdominal pain and increased bloody stools through the ileostomy bag since 8 PM last night.  Patient's last food was around 4 PM.  Patient's pain is mostly around the epigastric area going to the back and chest.  Pt s/p mechanical thrombectomy, of the superior mesenteric artery, using penumbra device. ETT 8/15-8/16.   Assessment / Plan / Recommendation Clinical Impression  Pt presents with a suspected primary esophageal dysphagia and possible component of pharyngeal dysphagia. Pt alert, cooperative. She states having mild sore throat post intubation (1 day). No hoarseness in voice appreciated. Pt with intermittent delayed throat clearing with ice chips and thin liquids and also reported sensation that she could not clear mucous when trying to clear throat. No  wetness in voice or overt coughing exhibited. Pt with reports of globus sensation following puree (pointing to sternal region). Pt frequently stated "I feel like I have to burp and can't". Per discussion with pts neice, pt had been taking Tums more frequently, though denies hx of GERD. Pt would benefit from esophageal workup per MD discretion such as barium esophagram. Recommend dysphagia 3 (mechanical soft) and thin liquids with meds as tolerated. SLP to follow up.   SLP Visit Diagnosis: Dysphagia, unspecified (R13.10)    Aspiration Risk  Mild aspiration risk    Diet Recommendation   Dysphagia 3 (mechanical soft) thin liquids  Medication Administration: Whole meds with liquid    Other  Recommendations Recommended Consults: Consider esophageal assessment Oral Care Recommendations: Oral care BID   Follow up Recommendations Other (comment) (TBD)      Frequency and Duration min 2x/week  2 weeks       Prognosis Prognosis for Safe Diet Advancement: Good Barriers to Reach Goals: Time post onset      Swallow Study   General Date of Onset: 03/29/21 HPI: Jasmine Mccormick is a 87 y.o. female with history of ulcerative colitis status post colectomy with ileostomy done more than 20 years ago with history of hypertension and hypothyroidism presents to the ER with sudden onset of abdominal pain and increased bloody stools through the ileostomy bag since 8 PM last night.  Patient's last food was around 4 PM.  Patient's pain  is mostly around the epigastric area going to the back and chest.  Pt s/p mechanical thrombectomy, of the superior mesenteric artery, using penumbra device. ETT 8/15-8/16. Type of Study: Bedside Swallow Evaluation Previous Swallow Assessment: none on file Diet Prior to this Study: Regular;Thin liquids (per RN holding POs until seen by SLP) Temperature Spikes Noted: No Respiratory Status: Nasal cannula History of Recent Intubation: Yes Length of Intubations (days): 1  days Date extubated: 03/30/21 Behavior/Cognition: Alert;Cooperative;Requires cueing Oral Cavity Assessment: Within Functional Limits Oral Care Completed by SLP: Yes Oral Cavity - Dentition: Adequate natural dentition Vision: Functional for self-feeding Self-Feeding Abilities: Needs assist Patient Positioning: Upright in bed Baseline Vocal Quality: Normal Volitional Cough: Strong Volitional Swallow: Unable to elicit    Oral/Motor/Sensory Function Overall Oral Motor/Sensory Function: Generalized oral weakness   Ice Chips Ice chips: Impaired Presentation: Spoon Oral Phase Impairments: Reduced lingual movement/coordination Oral Phase Functional Implications: Prolonged oral transit Pharyngeal Phase Impairments: Suspected delayed Swallow;Multiple swallows;Decreased hyoid-laryngeal movement;Throat Clearing - Delayed   Thin Liquid Thin Liquid: Impaired Presentation: Cup;Straw Oral Phase Functional Implications: Prolonged oral transit Pharyngeal  Phase Impairments: Suspected delayed Swallow;Multiple swallows;Throat Clearing - Delayed    Nectar Thick Nectar Thick Liquid: Not tested   Honey Thick Honey Thick Liquid: Not tested   Puree Puree: Impaired Presentation: Spoon Pharyngeal Phase Impairments: Suspected delayed Swallow;Decreased hyoid-laryngeal movement;Multiple swallows   Solid     Solid: Impaired Presentation: Self Fed Oral Phase Impairments: Reduced lingual movement/coordination Pharyngeal Phase Impairments: Suspected delayed Swallow;Multiple swallows      Hayden Rasmussen MA, CCC-SLP Acute Rehabilitation Services   03/30/2021,1:33 PM

## 2021-03-30 NOTE — Progress Notes (Signed)
[place] Progress Note Patient Name: Jasmine Mccormick DOB: [birth year] MRN: [id number]   Date of Service  03/30/2021  HPI/Events of Note  ABG 7.41/24/117 VAC 24/490/40%/5 PEEP Patient breathing over the set rate  eICU Interventions  Decrease rate to 22 to avoid respiratory alkalosis. May need to decrease minute ventilation further     Intervention Category Intermediate Interventions: Diagnostic test evaluation  Judd Lien 03/30/2021, 6:12 AM

## 2021-03-30 NOTE — Progress Notes (Signed)
ANTICOAGULATION CONSULT NOTE   Pharmacy Consult for heparin Indication: atrial fibrillation  No Known Allergies  Patient Measurements: Height: 5' 7"  (170.2 cm) Weight: 68 kg (150 lb) IBW/kg (Calculated) : 61.6 Heparin Dosing Weight: 68 kg   Vital Signs: Temp: 98.6 F (37 C) (08/16 1700) Temp Source: Oral (08/16 1100) BP: 124/78 (08/16 1600) Pulse Rate: 128 (08/16 1600)  Labs: Recent Labs    03/28/21 2300 03/28/21 2356 03/29/21 0156 03/29/21 0235 03/29/21 1217 03/29/21 1524 03/29/21 2145 03/30/21 0423 03/30/21 0435 03/30/21 0641 03/30/21 1356 03/30/21 1600 03/30/21 1620  HGB 13.4   < >  --    < >  --    < > 14.8 14.3 14.4  --  13.8  --   --   HCT 40.5   < >  --    < >  --    < > 44.3 42.0 42.1  --  40.8  --   --   PLT 287  --   --    < >  --    < > 297  --  268  --  251  --   --   APTT 27  --   --   --   --   --   --   --   --   --   --   --   --   LABPROT 13.7  --   --   --   --   --   --   --   --   --   --   --   --   INR 1.0  --   --   --   --   --   --   --   --   --   --   --   --   HEPARINUNFRC  --   --   --   --   --   --  0.85*  --   --  0.68  --  0.27*  --   CREATININE 0.86   < >  --   --  0.76  --   --   --  1.39*  --   --   --  1.35*  TROPONINIHS 9  --  9  --   --   --   --   --   --   --   --   --   --    < > = values in this interval not displayed.     Estimated Creatinine Clearance: 30.2 mL/min (A) (by C-G formula based on SCr of 1.35 mg/dL (H)).   Medical History: Past Medical History:  Diagnosis Date   COVID 12/18/2020   mild   HTN (hypertension)    Normal at home   Hyperlipidemia    Hypothyroidism    Ileostomy in place [facility])    Memory loss    mild but not dx by doctor per Neice   Meniere disease 1996   Osteoporosis    UC (ulcerative colitis) ([place])     Medications:  Scheduled:   aspirin EC  81 mg Oral Daily   chlorhexidine gluconate (MEDLINE KIT)  15 mL Mouth Rinse BID   Chlorhexidine Gluconate Cloth  6 each Topical Daily    [START ON 04/01/2021] levothyroxine  25 mcg Intravenous Daily   metoprolol tartrate  2.5 mg Intravenous Q6H   [START ON 04/01/2021] pantoprazole  40 mg Intravenous Q12H   sodium chloride flush  3 mL Intravenous Q12H    Assessment: 29 yof 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 Now s/p mechanical thrombectomy on 8/15. No AC PTA.  Heparin level  0.27 on low end of goal this evening on reduced dose of heparin 750 uts/hr. Hgb 14.4, plt 268. No s/sx of bleeding or infusion issues.   Goal of Therapy:  Heparin level 0.3-0.7 units/ml Monitor platelets by anticoagulation protocol: Yes   Plan:  Increase heparin infusion to 800 units/hr  Monitor daily HL, CBC, and for s/sx of bleeding    Bonnita Nasuti Pharm.D. CPP, BCPS Clinical Pharmacist [phone] 03/30/2021 6:14 PM   Please check AMION for all [place] phone numbers After 10:00 PM, call [place] [phone]

## 2021-03-30 NOTE — Progress Notes (Signed)
1 Day Post-Op  Subjective: Patient just extubated but alert and answers questions appropriately.  Denies abdominal pain.  Ileostomy working.  ROS: See above, otherwise other systems negative  Objective: Vital signs in last 24 hours: Temp:  [88.9 F (31.6 C)-100 F (37.8 C)] 100 F (37.8 C) (08/16 0600) Pulse Rate:  [84-119] 114 (08/16 0700) Resp:  [14-24] 22 (08/16 0700) BP: (82-150)/(57-84) 125/84 (08/16 0700) SpO2:  [96 %-100 %] 98 % (08/16 0700) Arterial Line BP: (74-172)/(43-82) 147/67 (08/16 0700) FiO2 (%):  [40 %-100 %] 40 % (08/16 0735)    Intake/Output from previous day: 08/15 0701 - 08/16 0700 In: 3663.1 [I.V.:2885.2; IV Piggyback:777.9] Out: 3880 [Urine:1655; UXLKG:4010; Blood:250] Intake/Output this shift: No intake/output data recorded.  PE: Abd: soft, NT, ND, +BS, ileostomy with bilious output  Lab Results:  Recent Labs    03/29/21 2145 03/30/21 0423 03/30/21 0435  WBC 9.4  --  11.0*  HGB 14.8 14.3 14.4  HCT 44.3 42.0 42.1  PLT 297  --  268   BMET Recent Labs    03/29/21 1217 03/29/21 1541 03/30/21 0423 03/30/21 0435  NA 136   < > 136 133*  K 3.4*   < > 4.8 4.7  CL 108  --   --  108  CO2 20*  --   --  16*  GLUCOSE 186*  --   --  155*  BUN 15  --   --  24*  CREATININE 0.76  --   --  1.39*  CALCIUM 7.5*  --   --  7.9*   < > = values in this interval not displayed.   PT/INR Recent Labs    03/28/21 2300  LABPROT 13.7  INR 1.0   CMP     Component Value Date/Time   NA 133 (L) 03/30/2021 0435   K 4.7 03/30/2021 0435   CL 108 03/30/2021 0435   CO2 16 (L) 03/30/2021 0435   GLUCOSE 155 (H) 03/30/2021 0435   BUN 24 (H) 03/30/2021 0435   CREATININE 1.39 (H) 03/30/2021 0435   CALCIUM 7.9 (L) 03/30/2021 0435   PROT 4.8 (L) 03/30/2021 0435   ALBUMIN 2.5 (L) 03/30/2021 0435   AST 32 03/30/2021 0435   ALT 18 03/30/2021 0435   ALKPHOS 36 (L) 03/30/2021 0435   BILITOT 0.9 03/30/2021 0435   GFRNONAA 37 (L) 03/30/2021 0435   GFRAA 106  09/17/2008 0747   Lipase     Component Value Date/Time   LIPASE 50 03/28/2021 2300       Studies/Results: DG Chest Port 1 View  Result Date: 03/29/2021 CLINICAL DATA:  Endotracheal tube placement. EXAM: PORTABLE CHEST 1 VIEW COMPARISON:  March 28, 2021. FINDINGS: Stable cardiomediastinal silhouette. Endotracheal and nasogastric tubes appear to be in grossly good position. Right internal jugular catheter is noted with distal tip in expected position of cavoatrial junction. No pneumothorax is noted. Mild bibasilar subsegmental atelectasis or edema is noted. Bony thorax is unremarkable. IMPRESSION: Endotracheal and nasogastric tubes appear to be in grossly good position. Right internal jugular catheter is noted with tip in expected position of cavoatrial junction. Mild bibasilar subsegmental atelectasis or edema is noted. Electronically Signed   By: Marijo Conception M.D.   On: 03/29/2021 11:37   DG Chest Portable 1 View  Result Date: 03/28/2021 CLINICAL DATA:  Pleuritic chest pain. EXAM: PORTABLE CHEST 1 VIEW COMPARISON:  None. FINDINGS: Significant patient rotation limits assessment. Heart size is grossly normal. There is aortic atherosclerosis. Borderline hyperinflation  with minimal bronchial thickening. No confluent airspace disease. Limited assessment of the left lung base due to overlapping soft tissue structures. No pneumothorax. Limited assessment for pleural effusion due to positioning. No pulmonary edema. Degenerative change of both shoulders. No acute osseous abnormalities are seen. IMPRESSION: Significant patient rotation limits assessment. Borderline hyperinflation and minimal bronchial thickening. Electronically Signed   By: Keith Rake M.D.   On: 03/28/2021 23:54   DG Abd Portable 1V  Result Date: 03/29/2021 CLINICAL DATA:  Nasogastric tube placement. EXAM: PORTABLE ABDOMEN - 1 VIEW COMPARISON:  None. FINDINGS: The bowel gas pattern is normal. Distal tip of nasogastric tube is  seen in the stomach. No radio-opaque calculi or other significant radiographic abnormality are seen. IMPRESSION: Distal tip of nasogastric tube seen in the stomach. No abnormal bowel dilatation is noted. Electronically Signed   By: Marijo Conception M.D.   On: 03/29/2021 11:38   HYBRID OR IMAGING ([facility] ONLY)  Result Date: 03/29/2021 There is no interpretation for this exam.  This order is for images obtained during a surgical procedure.  Please See "Surgeries" Tab for more information regarding the procedure.   CT Angio Abd/Pel W and/or Wo Contrast  Result Date: 03/29/2021 CLINICAL DATA:  Abdominal pain radiating to the chest. EXAM: CTA ABDOMEN AND PELVIS WITHOUT AND WITH CONTRAST TECHNIQUE: Multidetector CT imaging of the abdomen and pelvis was performed using the standard protocol during bolus administration of intravenous contrast. Multiplanar reconstructed images and MIPs were obtained and reviewed to evaluate the vascular anatomy. CONTRAST:  70m OMNIPAQUE IOHEXOL 350 MG/ML SOLN COMPARISON:  None. FINDINGS: VASCULAR Aorta: Moderate to marked severity calcification of a caliber aorta without aneurysm, dissection, vasculitis or significant stenosis. Celiac: Patent without evidence of aneurysm, dissection, vasculitis or significant stenosis. SMA: Occlusion of the distal aspect of the superior mesenteric artery is seen (axial CT images 62 through 73, CT series 6). Enhancement of 1 of its distal branches is present. Renals: Both renal arteries are patent without evidence of aneurysm, dissection, vasculitis, fibromuscular dysplasia or significant stenosis. IMA: Not clearly visualized. Inflow: Moderate to marked severity diffuse calcification without evidence of aneurysm, dissection, vasculitis or significant stenosis. Proximal Outflow: Bilateral common femoral and visualized portions of the superficial and profunda femoral arteries are patent without evidence of aneurysm, dissection, vasculitis or significant  stenosis. Veins: No obvious venous abnormality within the limitations of this arterial phase study. Review of the MIP images confirms the above findings. NON-VASCULAR Lower chest: Mild to moderate severity scarring and/or atelectasis is seen along the posterior aspect of the bilateral lung bases. Hepatobiliary: A punctate calcified granuloma is seen within the anterior aspect of the right lobe of the liver. The gallbladder is not clearly identified and may be partially contracted. Pancreas: Unremarkable. No pancreatic ductal dilatation or surrounding inflammatory changes. Spleen: Normal in size without focal abnormality. Adrenals/Urinary Tract: Adrenal glands are unremarkable. Kidneys are normal in size, without obstructing renal calculi or hydronephrosis. Cortical scarring is seen along the lateral aspect of the mid left kidney. An adjacent 1.7 cm diameter cystic appearing area is noted. A 2 mm nonobstructing renal stone is seen within the mid to upper left kidney. Bladder is unremarkable. Stomach/Bowel: There is a large hiatal hernia. Appendix appears normal. No evidence of bowel wall thickening, distention, or inflammatory changes. Lymphatic: No abnormal abdominal or pelvic lymph nodes are identified. Reproductive: Uterus and bilateral adnexa are unremarkable. Other: A right lower quadrant ostomy site is seen. No abdominopelvic ascites. Musculoskeletal: There is evidence of prior vertebroplasty  at the level of L3. Marked severity chronic and degenerative changes are also seen at the level of L2-L3. IMPRESSION: VASCULAR 1. 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 2. Moderate to marked severity diffuse calcification and atherosclerosis throughout the remaining arterial structures within the abdomen and pelvis. NON-VASCULAR 1. 2 mm nonobstructing left renal stone. 2. Postoperative changes with a subsequent right lower quadrant ostomy site. 3. Large hiatal hernia.  Electronically Signed   By: Virgina Norfolk M.D.   On: 03/29/2021 01:35    Anti-infectives: Anti-infectives (From admission, onward)    Start     Dose/Rate Route Frequency Ordered Stop   03/29/21 0445  piperacillin-tazobactam (ZOSYN) IVPB 3.375 g        3.375 g 12.5 mL/hr over 240 Minutes Intravenous Every 8 hours 03/29/21 0442          Assessment/Plan POD 1, s/p mechanical thrombectomy of SMA by Dr. Trula Slade -lactic acid stable around 3 last night -extubated this morning and denies abdominal pain -no further abx needed from my standpoint as suspicion for any bowel ischemia is very low at this time.  Defer to vascular -will defer diet to vascular as well -no acute surgical needs  FEN - may leave OGT out since extubated, diet per vascular VTE - heparin gtt ID - zosyn, defer duration to vascular   LOS: 1 day    Henreitta Cea , [facility] 03/30/2021, 8:23 AM Please see Amion for pager number during day hours 7:00am-4:30pm or 7:00am -11:30am on weekends

## 2021-03-30 NOTE — Progress Notes (Signed)
Physical Therapy Note  PT eval complete with full note to follow;  Recommend Home with 24 hour assist and HHPT/OT/Aide for transition out of hospital; Will consider Rollator RW versus regular RW; Recommend also wheelchair; Pt has supportive family near, and her niece, Jocelyn Lamer, confirmed that family will be able to provide 24 hour assistance;   Roney Marion, [place] Pager [phone] Office [phone]

## 2021-03-30 NOTE — Progress Notes (Signed)
Will check on tomorrow with her persistent tachycardia. CBC benign; BMP looks okay  Erskine Emery MD PCCM

## 2021-03-30 NOTE — Evaluation (Signed)
Physical Therapy Evaluation Patient Details Name: Jasmine Mccormick MRN: [id number] DOB: 04/25/1934 Today's Date: 03/30/2021   History of Present Illness  Jasmine Mccormick is a 87 y.o. 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 Comes from home where she lives alone, but with family close by; Husband recently passed; Typically walks with a cane; Presents to PT with generalized weakness, and functional dependencies with transfers and gait; Pt currently with functional limitations due to the deficits listed below (see PT Problem List). Pt will benefit from skilled PT to increase their independence and safety with mobility to allow discharge to the venue listed below.       Follow Up Recommendations Home health PT;Supervision/Assistance - 24 hour;Other (comment) (pt and neice prefer not to go to SNF)    Equipment Recommendations  Rolling walker with 5" wheels;3in1 (PT);Wheelchair (measurements PT);Wheelchair cushion (measurements PT);Other (comment) (and can consider hopsital bed)    Recommendations for Other Services OT consult (ordered per protocol)     Precautions / Restrictions Precautions Precautions: Fall Precaution Comments: Fatigues quickly      Mobility  Bed Mobility Overal bed mobility: Needs Assistance Bed Mobility: Supine to Sit     Supine to sit: Mod assist;+2 for safety/equipment     General bed mobility comments: Cues for technique and mod assist to come to sit and steady at initial sit; light mod assist and use of bed pad to reciprocally scoot to EOB    Transfers Overall transfer level: Needs  assistance Equipment used: 2 person hand held assist Transfers: Stand Pivot Transfers;Squat Pivot Transfers   Stand pivot transfers: Mod assist;+2 physical assistance Squat pivot transfers: Mod assist;+2 physical assistance     General transfer comment: 2 person assist giving bilateralsupport and lifting assist for sit to stand; fatigued during transfer OOB to recliner and required 2 person assist to control descent to sit in recliner  Ambulation/Gait                Stairs            Wheelchair Mobility    Modified Rankin (Stroke Patients Only)       Balance Overall balance assessment: Needs assistance Sitting-balance support: Bilateral upper extremity supported;Feet supported Sitting balance-Leahy Scale: Fair       Standing balance-Leahy Scale: Poor Standing balance comment: fatigues quickly in standing                             Pertinent Vitals/Pain      Home Living Family/patient expects to be discharged to:: Private residence Living Arrangements: Alone Available Help at Discharge: Family;Available 24 hours/day;Other (Comment) (Neice confirmed that family will arrange for 24 hour assist) Type of Home: House Home Access: Stairs to enter   [employer] of Steps: 3 Home Layout: One level;Laundry or work area in [place]: [name] - quad      Prior Function Level of Independence: Needs assistance   Gait / Transfers Assistance Needed: Typially walks with cane; tells me her PCP asked her to get a RW  ADL's / Homemaking Assistance Needed: has been managing her  own ileostomy for over 20 years        Hand Dominance        Extremity/Trunk Assessment   Upper Extremity Assessment Upper Extremity Assessment: Generalized weakness    Lower Extremity Assessment Lower Extremity Assessment: Generalized weakness    Cervical / Trunk Assessment Cervical / Trunk Assessment: Kyphotic  Communication   Communication:  HOH  Cognition Arousal/Alertness: Awake/alert Behavior During Therapy: WFL for tasks assessed/performed Overall Cognitive Status: Impaired/Different from baseline Area of Impairment: Memory                     Memory: Decreased short-term memory         General Comments: Notable that pt's neice reminded her of a lot/corrected pt multiple time during interview      General Comments General comments (skin integrity, edema, etc.): Session conducted on Room air and o2 sats remained greater than or equal to 93%; HR incr with activity    Exercises     Assessment/Plan    PT Assessment Patient needs continued PT services  PT Problem List Decreased strength;Decreased activity tolerance;Decreased balance;Decreased mobility;Decreased coordination;Decreased cognition;Decreased knowledge of use of DME;Decreased safety awareness;Decreased knowledge of precautions;Cardiopulmonary status limiting activity       PT Treatment Interventions DME instruction;Gait training;Stair training;Functional mobility training;Therapeutic activities;Therapeutic exercise;Balance training;Neuromuscular re-education;Cognitive remediation;Patient/family education;Wheelchair mobility training    PT Goals (Current goals can be found in the Care Plan section)  Acute Rehab PT Goals Patient Stated Goal: hopes to get home PT Goal Formulation: With patient Time For Goal Achievement: 04/13/21 Potential to Achieve Goals: Fair    Frequency Min 3X/week   Barriers to discharge Other (comment) Family will need ot provide 24 hour assist if pt is to dc home    Co-evaluation               AM-PAC PT "6 Clicks" Mobility  Outcome Measure Help needed turning from your back to your side while in a flat bed without using bedrails?: A Little Help needed moving from lying on your back to sitting on the side of a flat bed without using bedrails?: A Lot Help needed moving to and from a bed to a chair (including a  wheelchair)?: A Lot Help needed standing up from a chair using your arms (e.g., wheelchair or bedside chair)?: A Lot Help needed to walk in hospital room?: Total Help needed climbing 3-5 steps with a railing? : Total 6 Click Score: 11    End of Session Equipment Utilized During Treatment: Gait belt Activity Tolerance: Patient limited by fatigue Patient left: in chair;with call bell/phone within reach;with family/visitor present Nurse Communication: Mobility status PT Visit Diagnosis: Unsteadiness on feet (R26.81);Other abnormalities of gait and mobility (R26.89);Muscle weakness (generalized) (M62.81)    Time: 4782-9562 PT Time Calculation (min) (ACUTE ONLY): 27 min   Charges:   PT Evaluation $PT Eval Moderate Complexity: 1 Mod PT Treatments $Therapeutic Activity: 8-22 mins        Roney Marion, PT  Acute Rehabilitation Services Pager [phone] Office [place] 03/30/2021, 7:49 PM

## 2021-03-30 NOTE — Progress Notes (Addendum)
[facility]  Subjective  - Intubated, moving all 4 ext.   Objective 113/79 (!) 105 100 F (37.8 C) (!) 22 98%  Intake/Output Summary (Last 24 hours) at 03/30/2021 0718 Last data filed at 03/30/2021 0600 Gross per 24 hour  Intake 3663.06 ml  Output 3630 ml  Net 33.06 ml    Abdomin soft, right LQ ostomy bag in place, no BS to auscultation. B doppler signals right DP left PT signals Heart Afib Lungs intubated  Assessment/Planning: POD # 1 Mechanical thrombectomy, of the superior mesenteric artery  On Heparin, history of Afib Urine OP > 1400 Mild leukocytosis 11.0 on IV antibiotics Cr 1.39 will observe HGB stable 14.4 Left intubated post op due to acidemia and risk of progression to bowel ischemia.  Thx CCM for managing.  Pending extubation for patient input on how her abdomin feels.   Vitals stable   Jasmine Mccormick 03/30/2021 7:18 AM --  Laboratory Lab Results: Recent Labs    03/29/21 2145 03/30/21 0423 03/30/21 0435  WBC 9.4  --  11.0*  HGB 14.8 14.3 14.4  HCT 44.3 42.0 42.1  PLT 297  --  268   BMET Recent Labs    03/29/21 1217 03/29/21 1541 03/30/21 0423 03/30/21 0435  NA 136   < > 136 133*  K 3.4*   < > 4.8 4.7  CL 108  --   --  108  CO2 20*  --   --  16*  GLUCOSE 186*  --   --  155*  BUN 15  --   --  24*  CREATININE 0.76  --   --  1.39*  CALCIUM 7.5*  --   --  7.9*   < > = values in this interval not displayed.    COAG Lab Results  Component Value Date   INR 1.0 03/28/2021   No results found for: PTT  I agree with the above.  I have seen and evaluated the patient.  She is postoperative day 1 from mesenteric mechanical thrombectomy.  I have seen her multiple times today.  She has been extubated.  She is not complaining of any abdominal pain.  On exam her abdomen is soft and nontender.  She has a palpable right dorsalis pedis pulse and brisk posterior tibial Doppler signal on the right with palpable  femoral pulses bilaterally.  Given her reperfusion after thrombectomy, I suspect she did have some level of ischemic changes to her bowel.  For that reason I will not feed her today but if she does well overnight, she can start with clears tomorrow.  I am also going to continue her on antibiotics for another 2 days.  She remains on heparin for atrial fibrillation.  Echocardiogram was performed today.  She will need to be transition to oral anticoagulation prior to discharge.  She will also need evaluation by cardiology either in the hospital or as an outpatient.  Her niece was at the bedside and was updated.  Jasmine Mccormick

## 2021-03-31 ENCOUNTER — Other Ambulatory Visit (HOSPITAL_COMMUNITY): Payer: Self-pay

## 2021-03-31 ENCOUNTER — Encounter (HOSPITAL_COMMUNITY): Payer: Self-pay | Admitting: Surgery

## 2021-03-31 DIAGNOSIS — I480 Paroxysmal atrial fibrillation: Secondary | ICD-10-CM

## 2021-03-31 DIAGNOSIS — K55069 Acute infarction of intestine, part and extent unspecified: Secondary | ICD-10-CM | POA: Diagnosis not present

## 2021-03-31 LAB — COMPREHENSIVE METABOLIC PANEL
ALT: 14 U/L (ref 0–44)
ALT: 15 U/L (ref 0–44)
AST: 24 U/L (ref 15–41)
AST: 26 U/L (ref 15–41)
Albumin: 2.2 g/dL — ABNORMAL LOW (ref 3.5–5.0)
Albumin: 2.3 g/dL — ABNORMAL LOW (ref 3.5–5.0)
Alkaline Phosphatase: 36 U/L — ABNORMAL LOW (ref 38–126)
Alkaline Phosphatase: 40 U/L (ref 38–126)
Anion gap: 6 (ref 5–15)
Anion gap: 5 (ref 5–15)
BUN: 25 mg/dL — ABNORMAL HIGH (ref 8–23)
BUN: 20 mg/dL (ref 8–23)
CO2: 22 mmol/L (ref 22–32)
CO2: 23 mmol/L (ref 22–32)
Calcium: 7.8 mg/dL — ABNORMAL LOW (ref 8.9–10.3)
Calcium: 8.2 mg/dL — ABNORMAL LOW (ref 8.9–10.3)
Chloride: 105 mmol/L (ref 98–111)
Chloride: 104 mmol/L (ref 98–111)
Creatinine, Ser: 1.12 mg/dL — ABNORMAL HIGH (ref 0.44–1.00)
Creatinine, Ser: 1.07 mg/dL — ABNORMAL HIGH (ref 0.44–1.00)
GFR, Estimated: 48 mL/min — ABNORMAL LOW (ref >60)
GFR, Estimated: 51 mL/min — ABNORMAL LOW (ref >60)
Glucose, Bld: 106 mg/dL — ABNORMAL HIGH (ref 70–99)
Glucose, Bld: 121 mg/dL — ABNORMAL HIGH (ref 70–99)
Potassium: 3.8 mmol/L (ref 3.5–5.1)
Potassium: 4.2 mmol/L (ref 3.5–5.1)
Sodium: 133 mmol/L — ABNORMAL LOW (ref 135–145)
Sodium: 132 mmol/L — ABNORMAL LOW (ref 135–145)
Total Bilirubin: 0.8 mg/dL (ref 0.3–1.2)
Total Bilirubin: 0.9 mg/dL (ref 0.3–1.2)
Total Protein: 4.6 g/dL — ABNORMAL LOW (ref 6.5–8.1)
Total Protein: 5.1 g/dL — ABNORMAL LOW (ref 6.5–8.1)

## 2021-03-31 LAB — CBC
HCT: 35.2 % — ABNORMAL LOW (ref 36.0–46.0)
Hemoglobin: 11.9 g/dL — ABNORMAL LOW (ref 12.0–15.0)
MCH: 32.9 pg (ref 26.0–34.0)
MCHC: 33.8 g/dL (ref 30.0–36.0)
MCV: 97.2 fL (ref 80.0–100.0)
Platelets: 205 10*3/uL (ref 150–400)
RBC: 3.62 MIL/uL — ABNORMAL LOW (ref 3.87–5.11)
RDW: 13 % (ref 11.5–15.5)
WBC: 8.9 10*3/uL (ref 4.0–10.5)
nRBC: 0 % (ref 0.0–0.2)

## 2021-03-31 LAB — HEPARIN LEVEL (UNFRACTIONATED): Heparin Unfractionated: 0.26 IU/mL — ABNORMAL LOW (ref 0.30–0.70)

## 2021-03-31 MED ORDER — METOPROLOL TARTRATE 12.5 MG HALF TABLET
12.5000 mg | ORAL_TABLET | Freq: Two times a day (BID) | ORAL | Status: DC
  Administered 2021-03-31 (×2): 12.5 mg via ORAL
  Filled 2021-03-31 (×2): qty 1

## 2021-03-31 MED ORDER — EZETIMIBE 10 MG PO TABS
10.0000 mg | ORAL_TABLET | Freq: Every day | ORAL | Status: DC
  Administered 2021-03-31 – 2021-04-06 (×7): 10 mg via ORAL
  Filled 2021-03-31 (×7): qty 1

## 2021-03-31 MED ORDER — PANTOPRAZOLE SODIUM 40 MG PO TBEC
40.0000 mg | DELAYED_RELEASE_TABLET | Freq: Two times a day (BID) | ORAL | Status: DC
  Administered 2021-03-31 – 2021-04-06 (×13): 40 mg via ORAL
  Filled 2021-03-31 (×13): qty 1

## 2021-03-31 MED ORDER — WARFARIN - PHARMACIST DOSING INPATIENT: Freq: Every day | Status: DC

## 2021-03-31 MED ORDER — ASPIRIN 81 MG PO CHEW
81.0000 mg | CHEWABLE_TABLET | Freq: Every day | ORAL | Status: DC
  Administered 2021-03-31: 81 mg via ORAL
  Filled 2021-03-31: qty 1

## 2021-03-31 MED ORDER — POTASSIUM CHLORIDE CRYS ER 10 MEQ PO TBCR
20.0000 meq | EXTENDED_RELEASE_TABLET | Freq: Two times a day (BID) | ORAL | Status: AC
  Administered 2021-03-31 (×2): 20 meq via ORAL
  Filled 2021-03-31 (×2): qty 2

## 2021-03-31 MED ORDER — SIMVASTATIN 20 MG PO TABS
20.0000 mg | ORAL_TABLET | Freq: Every day | ORAL | Status: DC
  Administered 2021-03-31 – 2021-04-05 (×6): 20 mg via ORAL
  Filled 2021-03-31 (×6): qty 1

## 2021-03-31 MED ORDER — HEPARIN (PORCINE) 25000 UT/250ML-% IV SOLN
900.0000 [IU]/h | INTRAVENOUS | Status: DC
  Administered 2021-04-01: 850 [IU]/h via INTRAVENOUS
  Administered 2021-04-02: 1000 [IU]/h via INTRAVENOUS
  Filled 2021-03-31 (×2): qty 250

## 2021-03-31 MED ORDER — LEVOTHYROXINE SODIUM 75 MCG PO TABS
75.0000 ug | ORAL_TABLET | Freq: Every day | ORAL | Status: DC
  Administered 2021-04-01 – 2021-04-06 (×6): 75 ug via ORAL
  Filled 2021-03-31 (×6): qty 1

## 2021-03-31 MED ORDER — APIXABAN 5 MG PO TABS
5.0000 mg | ORAL_TABLET | Freq: Two times a day (BID) | ORAL | Status: DC
  Administered 2021-03-31: 5 mg via ORAL
  Filled 2021-03-31: qty 1

## 2021-03-31 MED ORDER — EZETIMIBE-SIMVASTATIN 10-20 MG PO TABS
1.0000 | ORAL_TABLET | Freq: Every day | ORAL | Status: DC
  Filled 2021-03-31: qty 1

## 2021-03-31 MED ORDER — WARFARIN SODIUM 5 MG PO TABS
5.0000 mg | ORAL_TABLET | Freq: Once | ORAL | Status: AC
  Administered 2021-03-31: 5 mg via ORAL
  Filled 2021-03-31: qty 1

## 2021-03-31 NOTE — Progress Notes (Addendum)
[facility]  Subjective  - No increase in abdominal pain.  Tolerating sips of water    Objective (!) 105/59 (!) 127 98 F (36.7 C) (Oral) 16 94%  Intake/Output Summary (Last 24 hours) at 03/31/2021 0717 Last data filed at 03/31/2021 0700 Gross per 24 hour  Intake 603 ml  Output 2440 ml  Net -1837 ml   Brisk doppler right DP signal and left PT signal, N/M intact B LE Abdomin soft with minimal tenderness to deep palpation, flatus passed Lungs non labored breathing Heart Afib HR 127 Urine Op > 1100, Cr decreased 1.12 Stool 1300 in colostomy bag    Assessment/Planning: POD #   Heparin full dose, plan for oral anticoagulation at discharge Zosyn for 1 more day Will advance diet to clear liquids and advance as she tolerates She will need cardiology consult per Dr. Stephens Shire note either here or as OP.  He will let us know when. Hypokalemia will replace K+   ECHO FINDINGS   Left Ventricle: Left ventricular ejection fraction, by estimation, is 60  to 65%. The left ventricle has normal function. The left ventricle has no  regional wall motion abnormalities. The left ventricular internal cavity  size was normal in size. There is   no left ventricular hypertrophy. Left ventricular diastolic parameters  are indeterminate.   Right Ventricle: The right ventricular size is normal. No increase in  right ventricular wall thickness. Right ventricular systolic function is  normal. There is normal pulmonary artery systolic pressure. The tricuspid  regurgitant velocity is 2.44 m/s, and   with an assumed right atrial pressure of 3 mmHg, the estimated right  ventricular systolic pressure is 25.8 mmHg.   Left Atrium: Left atrial size was moderately dilated.   Right Atrium: Right atrial size was moderately dilated.   Pericardium: There is no evidence of pericardial effusion.   Mitral Valve: The mitral valve is degenerative in appearance. There is  mild  thickening of the mitral valve leaflet(s). There is mild  calcification of the mitral valve leaflet(s). Moderate mitral annular  calcification. Trivial mitral valve regurgitation.  No evidence of mitral valve stenosis.   Tricuspid Valve: The tricuspid valve is normal in structure. Tricuspid  valve regurgitation is mild . No evidence of tricuspid stenosis.   Aortic Valve: The aortic valve is tricuspid. Aortic valve regurgitation is  not visualized. Mild to moderate aortic valve sclerosis/calcification is  present, without any evidence of aortic stenosis. Aortic valve mean  gradient measures 3.0 mmHg. Aortic  valve peak gradient measures 5.4 mmHg. Aortic valve area, by VTI measures  2.43 cm.   Pulmonic Valve: The pulmonic valve was normal in structure. Pulmonic valve  regurgitation is not visualized. No evidence of pulmonic stenosis.   Aorta: The aortic root is normal in size and structure.   Venous: The inferior vena cava is normal in size with greater than 50%  respiratory variability, suggesting right atrial pressure of 3 mmHg.   IAS/Shunts: No atrial level shunt detected by color flow Doppler.   Additional Comments: Patient in rapid afib during study.   Roxy Horseman 03/31/2021 7:17 AM --  Laboratory Lab Results: Recent Labs    03/30/21 2039 03/31/21 0436  WBC 12.3* 8.9  HGB 13.6 11.9*  HCT 41.0 35.2*  PLT 261 205   BMET Recent Labs    03/30/21 1620 03/31/21 0436  NA 132* 133*  K 4.3 3.8  CL 105 105  CO2 18* 22  GLUCOSE 145*  106*  BUN 27* 25*  CREATININE 1.35* 1.12*  CALCIUM 8.0* 7.8*    COAG Lab Results  Component Value Date   INR 1.0 03/28/2021   No results found for: PTT   I agree with the above.  I have seen and examined the patient CV:  back in AFIB.  Echo completed.  Remains on Heparin and will need conversion to PO DOAC prior to discharge.  I spoke with cardiology for assistance with rate control Pulm:  tolerating extubation GI:   con't abx for 1 more day given ischemic bowel.  No further blood  in ostomy bag.  Will slowly advance diet Prophylaxis:  start PO protonix and heparin  1 more day in ICU Renal:  good UOP.  Monitor creatinine  Annamarie Major

## 2021-03-31 NOTE — Progress Notes (Addendum)
[facility] for heparin Indication: atrial fibrillation  No Known Allergies  Patient Measurements: Height: 5' 7"  (170.2 cm) Weight: 68 kg (150 lb) IBW/kg (Calculated) : 61.6 Heparin Dosing Weight: 68 kg   Vital Signs: Temp: 98 F (36.7 C) (08/17 0317) Temp Source: Oral (08/17 0317) BP: 105/59 (08/17 0600) Pulse Rate: 127 (08/17 0600)  Labs: Recent Labs    03/28/21 2300 03/28/21 2356 03/29/21 0156 03/29/21 0235 03/30/21 0435 03/30/21 0641 03/30/21 1356 03/30/21 1600 03/30/21 1620 03/30/21 2039 03/31/21 0436  HGB 13.4   < >  --    < > 14.4  --  13.8  --   --  13.6 11.9*  HCT 40.5   < >  --    < > 42.1  --  40.8  --   --  41.0 35.2*  PLT 287  --   --    < > 268  --  251  --   --  261 205  APTT 27  --   --   --   --   --   --   --   --   --   --   LABPROT 13.7  --   --   --   --   --   --   --   --   --   --   INR 1.0  --   --   --   --   --   --   --   --   --   --   HEPARINUNFRC  --   --   --    < >  --  0.68  --  0.27*  --   --  0.26*  CREATININE 0.86   < >  --    < > 1.39*  --   --   --  1.35*  --  1.12*  TROPONINIHS 9  --  9  --   --   --   --   --   --   --   --    < > = values in this interval not displayed.     Estimated Creatinine Clearance: 36.4 mL/min (A) (by C-G formula based on SCr of 1.12 mg/dL (H)).   Medical History: Past Medical History:  Diagnosis Date   COVID 12/18/2020   mild   HTN (hypertension)    Normal at home   Hyperlipidemia    Hypothyroidism    Ileostomy in place [facility])    Memory loss    mild but not dx by doctor per Neice   Meniere disease 1996   Osteoporosis    UC (ulcerative colitis) ([place])     Medications:  Scheduled:   aspirin  81 mg Oral Daily   chlorhexidine gluconate (MEDLINE KIT)  15 mL Mouth Rinse BID   Chlorhexidine Gluconate Cloth  6 each Topical Daily   ezetimibe  10 mg Oral Daily   And   simvastatin  20 mg Oral q1800   [START ON 04/01/2021] levothyroxine  25 mcg Intravenous  Daily   metoprolol tartrate  2.5 mg Intravenous Q6H   [START ON 04/01/2021] pantoprazole  40 mg Intravenous Q12H   potassium chloride  20 mEq Oral BID   sodium chloride flush  3 mL Intravenous Q12H    Assessment: 86 yof 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 Now s/p mechanical thrombectomy on 8/15. No AC PTA.  Heparin level is  subtherapeutic at 0.26, on 800 units/hr. Hgb dropped down to 11.9, plt stable at 205. No s/sx of bleeding or infusion issues.   Unclear how drawn this morning - running central. Change heparin to peripheral and drawn central.  Goal of Therapy:  Heparin level 0.3-0.7 units/ml Monitor platelets by anticoagulation protocol: Yes   Plan:  Increase heparin infusion to 850 units/hr  Order heparin level in 8 hours Monitor daily HL, CBC, and for s/sx of bleeding   Antonietta Jewel, PharmD, [place] Pharmacist  Phone: [phone] 03/31/2021 7:50 AM  Please check AMION for all [place] phone numbers After 10:00 PM, call [facility] to switch to apixaban for Afib - given age>80 but Scr<1.5 and wt>60 kg - qualifies for full dose apixaban. Will stop heparin infusion and start apixaban at same time. Monitor CBC and for s/sx of bleeding.   Antonietta Jewel, PharmD, [place] Clinical Pharmacist

## 2021-03-31 NOTE — TOC Benefit Eligibility Note (Signed)
Patient Teacher, English as a foreign language completed.    The patient does not have an prescription coverage.  Lyndel Safe, [place] Patient Advocate Specialist [place] Antimicrobial Stewardship Team Direct Number: [phone]  Fax: [phone]

## 2021-03-31 NOTE — Progress Notes (Signed)
PCCM:   PCCM team will sign off at this time. Please call with any questions. Doing well post extubation.   Garner Nash, DO [facility] 03/31/2021 9:35 AM

## 2021-03-31 NOTE — Consult Note (Signed)
Cardiology Consultation:   Patient ID: Jasmine Mccormick; [id number]; 1934/08/04   Admit date: 03/28/2021 Date of Consult: 03/31/2021  Primary Care Provider: Cassandria Anger, MD Primary Cardiologist: New to [facility]  Patient Profile:   Jasmine Mccormick is a 87 y.o. female with a hx of ulcerative colitis s/p colectomy with ileostomy placement >20 years ago, HTN, and hypothyroidism who is being seen today for new onset atrial fibrillation at the request of Dr. Trula Slade.  History of Present Illness:   Jasmine Mccormick is an 87yo F with a hx as stated above who presented to [facility] 03/28/21 with sudden onset of abdominal pain and bloody ileostomy stools for several hours prior to ED presentation. She does report vague abdominal symptoms about 2 months ago however did not seek medical attention for this. Given her hx and acute symptoms, she presented for further evaluation.  In the ED, she had leukocytosis on labs with WBC at 22.1. Creatinine at 1.8, HsT 9. EKG stable with NSR and stable rates. Lactic acid markedly elevated at 8.4. She was started on empiric antibiotics. 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 VVS was consulted with recommendations for percutaneous revascularization however if unsuccessful, plan was to proceed with exploratory laparotomy and thromboembolectomy>>>general surgery consulted for this. She was taken urgently to the OR. She ultimately underwent successful recannulization and mechanical thrombectomy of the superior mesenteric artery with Dr. Trula Slade. Due to her abrupt acidemia and risk of progressive bowel ischemia she was left intubated after the procedure and PCCM asked to assist with mechanical ventilation. She has since been extubated and is doing well today on my exam. She denies a hx of palpitations, no prior hx of AF or CAD. She denies SOB, LE edema, or orthopnea. She lives alone and is very functional at baseline. She  denies hx of frequent falls.  Past Medical History:  Diagnosis Date   COVID 12/18/2020   mild   HTN (hypertension)    Normal at home   Hyperlipidemia    Hypothyroidism    Ileostomy in place [facility])    Memory loss    mild but not dx by doctor per [place]   Meniere disease 1996   Osteoporosis    UC (ulcerative colitis) ([place])     Past Surgical History:  Procedure Laterality Date   COLECTOMY     COLONOSCOPY     Ear Shunt for Menier's     ILEOSTOMY     KYPHOPLASTY N/A 01/07/2021   Procedure: Lumbar three Kyphoplasty;  Surgeon: Dawley, Theodoro Doing, DO;  Location: [facility];  Service: Neurosurgery;  Laterality: N/A;   PROCTECTOMY       Prior to Admission medications   Medication Sig Start Date End Date Taking? Authorizing Provider  ASPIRIN 81 PO Take 1 tablet by mouth daily.   Yes [provider]  ezetimibe-simvastatin (VYTORIN) 10-20 MG tablet Take 1 tablet by mouth daily.   Yes [provider]  Multiple Vitamins-Minerals (MULTIVITAMIN,TX-MINERALS) tablet Take 1 tablet by mouth daily.   Yes [provider]  levothyroxine (SYNTHROID) 75 MCG tablet Take 1 tablet (75 mcg total) by mouth daily. 03/29/21   Plotnikov, Evie Lacks, MD    Inpatient Medications: Scheduled Meds:  aspirin  81 mg Oral Daily   chlorhexidine gluconate (MEDLINE KIT)  15 mL Mouth Rinse BID   Chlorhexidine Gluconate Cloth  6 each Topical Daily   ezetimibe  10 mg Oral Daily   And   simvastatin  20 mg Oral q1800   [START ON 04/01/2021] levothyroxine  25 mcg Intravenous Daily   metoprolol tartrate  2.5 mg Intravenous Q6H   pantoprazole  40 mg Oral BID   potassium chloride  20 mEq Oral BID   sodium chloride flush  3 mL Intravenous Q12H   Continuous Infusions:  sodium chloride     heparin 800 Units/hr (03/30/21 1808)   lactated ringers 75 mL/hr at 03/30/21 1133   piperacillin-tazobactam (ZOSYN)  IV 3.375 g (03/31/21 0428)   promethazine (PHENERGAN) injection (IM or IVPB) 12.5 mg (03/31/21 0328)    PRN Meds: sodium chloride, acetaminophen, hydrALAZINE, hydrALAZINE, labetalol, ondansetron (ZOFRAN) IV, promethazine (PHENERGAN) injection (IM or IVPB), sodium chloride flush  Allergies:   No Known Allergies  Social History:   Social History   Socioeconomic History   Marital status: Widowed    Spouse name: Not on file   Number of children: Not on file   Years of education: 12   Highest education level: Not on file  Occupational History   Occupation: Retired  Tobacco Use   Smoking status: Never   Smokeless tobacco: Never  Vaping Use   Vaping Use: Never used  Substance and Sexual Activity   Alcohol use: No   Drug use: No   Sexual activity: Not Currently    Birth control/protection: Post-menopausal  Other Topics Concern   Not on file  Social History Narrative   Not on file   Social Determinants of Health   Financial Resource Strain: Low Risk    Difficulty of Paying Living Expenses: Not hard at all  Food Insecurity: No Food Insecurity   Worried About Charity fundraiser in the Last Year: Never true   [place] in the Last Year: Never true  Transportation Needs: No Transportation Needs   Lack of Transportation (Medical): No   Lack of Transportation (Non-Medical): No  Physical Activity: Inactive   Days of Exercise per Week: 0 days   Minutes of Exercise per Session: 0 min  Stress: No Stress Concern Present   Feeling of Stress : Not at all  Social Connections: Moderately Integrated   Frequency of Communication with Friends and Family: More than three times a week   Frequency of Social Gatherings with Friends and Family: More than three times a week   Attends Religious Services: More than 4 times per year   Active Member of [employer] or Organizations: Yes   Attends Archivist Meetings: More than 4 times per year   Marital Status: Widowed  Human resources officer Violence: Not At Risk   Fear of Current or Ex-Partner: No   Emotionally Abused: No   Physically  Abused: No   Sexually Abused: No    Family History:   Family History  Problem Relation Age of Onset   Hypertension Other    Heart disease Other    Breast cancer Sister    Family Status:  Family Status  Relation Name Status   Mother  Deceased   Father  Deceased   Other  (Not Specified)   Sister  (Not Specified)    ROS:  Please see the history of present illness.  All other ROS reviewed and negative.     Physical Exam/Data:   Vitals:   03/31/21 0317 03/31/21 0400 03/31/21 0500 03/31/21 0600  BP:  (!) 102/47 116/74 (!) 105/59  Pulse:  (!) 128 (!) 121 (!) 127  Resp:  17 18 16   Temp: 98 F (36.7 C)  TempSrc: Oral     SpO2:  95% 94% 94%  Weight:      Height:        Intake/Output Summary (Last 24 hours) at 03/31/2021 0916 Last data filed at 03/31/2021 0700 Gross per 24 hour  Intake 603 ml  Output 2315 ml  Net -1712 ml   Filed Weights   03/28/21 2231 03/28/21 2243  Weight: 72.6 kg 68 kg   Body mass index is 23.49 kg/m.   General: Elderly, well nourished, NAD Skin: Warm, dry, intact  Neck: Negative for carotid bruits. No JVD Lungs:Clear to ausculation bilaterally. Breathing is unlabored. Cardiovascular: Irregularly irregular. No murmurs Abdomen: Soft, non-tender, non-distended. No obvious abdominal masses. Extremities: No edema. Radial pulses 2+ bilaterally Neuro: Alert and oriented. No focal deficits. No facial asymmetry. MAE spontaneously. Psych: Responds to questions appropriately with normal affect.     EKG:  The EKG was personally reviewed and demonstrates: 03/29/21 AF with HR 101, no acute ST changes  Telemetry:  Telemetry was personally reviewed and demonstrates:  03/31/21 Atrial fibrillation with HR in the 120-130's   Relevant CV Studies:  Echocardiogram 03/30/21:   1. Patient in rapid afib during study.   2. Left ventricular ejection fraction, by estimation, is 60 to 65%. The  left ventricle has normal function. The left ventricle has no regional   wall motion abnormalities. Left ventricular diastolic parameters are  indeterminate.   3. Right ventricular systolic function is normal. The right ventricular  size is normal. There is normal pulmonary artery systolic pressure.   4. Left atrial size was moderately dilated.   5. Right atrial size was moderately dilated.   6. The mitral valve is degenerative. Trivial mitral valve regurgitation.  No evidence of mitral stenosis. Moderate mitral annular calcification.   7. The aortic valve is tricuspid. Aortic valve regurgitation is not  visualized. Mild to moderate aortic valve sclerosis/calcification is  present, without any evidence of aortic stenosis.   8. The inferior vena cava is normal in size with greater than 50%  respiratory variability, suggesting right atrial pressure of 3 mmHg.   Laboratory Data:  Chemistry Recent Labs  Lab 03/30/21 0435 03/30/21 1620 03/31/21 0436  NA 133* 132* 133*  K 4.7 4.3 3.8  CL 108 105 105  CO2 16* 18* 22  GLUCOSE 155* 145* 106*  BUN 24* 27* 25*  CREATININE 1.39* 1.35* 1.12*  CALCIUM 7.9* 8.0* 7.8*  GFRNONAA 37* 39* 48*  ANIONGAP 9 9 6     Total Protein  Date Value Ref Range Status  03/31/2021 4.6 (L) 6.5 - 8.1 g/dL Final   Albumin  Date Value Ref Range Status  03/31/2021 2.2 (L) 3.5 - 5.0 g/dL Final   AST  Date Value Ref Range Status  03/31/2021 24 15 - 41 U/L Final   ALT  Date Value Ref Range Status  03/31/2021 14 0 - 44 U/L Final   Alkaline Phosphatase  Date Value Ref Range Status  03/31/2021 36 (L) 38 - 126 U/L Final   Total Bilirubin  Date Value Ref Range Status  03/31/2021 0.8 0.3 - 1.2 mg/dL Final   Hematology Recent Labs  Lab 03/30/21 1356 03/30/21 2039 03/31/21 0436  WBC 13.3* 12.3* 8.9  RBC 4.27 4.24 3.62*  HGB 13.8 13.6 11.9*  HCT 40.8 41.0 35.2*  MCV 95.6 96.7 97.2  MCH 32.3 32.1 32.9  MCHC 33.8 33.2 33.8  RDW 12.9 12.9 13.0  PLT 251 261 205   Cardiac EnzymesNo results for input(s): TROPONINI  in the  last 168 hours. No results for input(s): TROPIPOC in the last 168 hours.  BNPNo results for input(s): BNP, PROBNP in the last 168 hours.  DDimer No results for input(s): DDIMER in the last 168 hours. TSH:  Lab Results  Component Value Date   TSH 9.46 (H) 03/17/2021   Lipids: Lab Results  Component Value Date   CHOL 191 09/17/2020   HDL 69.10 09/17/2020   LDLCALC 93 09/17/2020   TRIG 52 03/30/2021   CHOLHDL 3 09/17/2020   HgbA1c: Lab Results  Component Value Date   HGBA1C 6.2 09/17/2020    Radiology/Studies:  DG Chest Port 1 View  Result Date: 03/29/2021 CLINICAL DATA:  Endotracheal tube placement. EXAM: PORTABLE CHEST 1 VIEW COMPARISON:  March 28, 2021. FINDINGS: Stable cardiomediastinal silhouette. Endotracheal and nasogastric tubes appear to be in grossly good position. Right internal jugular catheter is noted with distal tip in expected position of cavoatrial junction. No pneumothorax is noted. Mild bibasilar subsegmental atelectasis or edema is noted. Bony thorax is unremarkable. IMPRESSION: Endotracheal and nasogastric tubes appear to be in grossly good position. Right internal jugular catheter is noted with tip in expected position of cavoatrial junction. Mild bibasilar subsegmental atelectasis or edema is noted. Electronically Signed   By: Marijo Conception M.D.   On: 03/29/2021 11:37   DG Chest Portable 1 View  Result Date: 03/28/2021 CLINICAL DATA:  Pleuritic chest pain. EXAM: PORTABLE CHEST 1 VIEW COMPARISON:  None. FINDINGS: Significant patient rotation limits assessment. Heart size is grossly normal. There is aortic atherosclerosis. Borderline hyperinflation with minimal bronchial thickening. No confluent airspace disease. Limited assessment of the left lung base due to overlapping soft tissue structures. No pneumothorax. Limited assessment for pleural effusion due to positioning. No pulmonary edema. Degenerative change of both shoulders. No acute osseous abnormalities are  seen. IMPRESSION: Significant patient rotation limits assessment. Borderline hyperinflation and minimal bronchial thickening. Electronically Signed   By: Keith Rake M.D.   On: 03/28/2021 23:54   DG Abd Portable 1V  Result Date: 03/29/2021 CLINICAL DATA:  Nasogastric tube placement. EXAM: PORTABLE ABDOMEN - 1 VIEW COMPARISON:  None. FINDINGS: The bowel gas pattern is normal. Distal tip of nasogastric tube is seen in the stomach. No radio-opaque calculi or other significant radiographic abnormality are seen. IMPRESSION: Distal tip of nasogastric tube seen in the stomach. No abnormal bowel dilatation is noted. Electronically Signed   By: Marijo Conception M.D.   On: 03/29/2021 11:38   ECHOCARDIOGRAM COMPLETE  Result Date: 03/30/2021    ECHOCARDIOGRAM REPORT   Patient Name:   Jasmine Mccormick [place] Date of Exam: 03/30/2021 Medical Rec #:  [id number]          Height:       67.0 in Accession #:    [id number]         Weight:       150.0 lb Date of Birth:  09/19/1933         BSA:          1.790 m Patient Age:    47 years           BP:           153/98 mmHg Patient Gender: F                  HR:           120 bpm. Exam Location:  Inpatient Procedure: 2D Echo, Cardiac Doppler and Color Doppler Indications:  AF  History:        Patient has no prior history of Echocardiogram examinations.                 Risk Factors:Hypertension and Dyslipidemia.  Sonographer:    [place] Referring Phys: [id number] [place]  1. Patient in rapid afib during study.  2. Left ventricular ejection fraction, by estimation, is 60 to 65%. The left ventricle has normal function. The left ventricle has no regional wall motion abnormalities. Left ventricular diastolic parameters are indeterminate.  3. Right ventricular systolic function is normal. The right ventricular size is normal. There is normal pulmonary artery systolic pressure.  4. Left atrial size was moderately dilated.  5. Right atrial size was moderately  dilated.  6. The mitral valve is degenerative. Trivial mitral valve regurgitation. No evidence of mitral stenosis. Moderate mitral annular calcification.  7. The aortic valve is tricuspid. Aortic valve regurgitation is not visualized. Mild to moderate aortic valve sclerosis/calcification is present, without any evidence of aortic stenosis.  8. The inferior vena cava is normal in size with greater than 50% respiratory variability, suggesting right atrial pressure of 3 mmHg. FINDINGS  Left Ventricle: Left ventricular ejection fraction, by estimation, is 60 to 65%. The left ventricle has normal function. The left ventricle has no regional wall motion abnormalities. The left ventricular internal cavity size was normal in size. There is  no left ventricular hypertrophy. Left ventricular diastolic parameters are indeterminate. Right Ventricle: The right ventricular size is normal. No increase in right ventricular wall thickness. Right ventricular systolic function is normal. There is normal pulmonary artery systolic pressure. The tricuspid regurgitant velocity is 2.44 m/s, and  with an assumed right atrial pressure of 3 mmHg, the estimated right ventricular systolic pressure is 94.7 mmHg. Left Atrium: Left atrial size was moderately dilated. Right Atrium: Right atrial size was moderately dilated. Pericardium: There is no evidence of pericardial effusion. Mitral Valve: The mitral valve is degenerative in appearance. There is mild thickening of the mitral valve leaflet(s). There is mild calcification of the mitral valve leaflet(s). Moderate mitral annular calcification. Trivial mitral valve regurgitation. No evidence of mitral valve stenosis. Tricuspid Valve: The tricuspid valve is normal in structure. Tricuspid valve regurgitation is mild . No evidence of tricuspid stenosis. Aortic Valve: The aortic valve is tricuspid. Aortic valve regurgitation is not visualized. Mild to moderate aortic valve sclerosis/calcification is  present, without any evidence of aortic stenosis. Aortic valve mean gradient measures 3.0 mmHg. Aortic valve peak gradient measures 5.4 mmHg. Aortic valve area, by VTI measures 2.43 cm. Pulmonic Valve: The pulmonic valve was normal in structure. Pulmonic valve regurgitation is not visualized. No evidence of pulmonic stenosis. Aorta: The aortic root is normal in size and structure. Venous: The inferior vena cava is normal in size with greater than 50% respiratory variability, suggesting right atrial pressure of 3 mmHg. IAS/Shunts: No atrial level shunt detected by color flow Doppler. Additional Comments: Patient in rapid afib during study.  LEFT VENTRICLE PLAX 2D LVIDd:         3.50 cm LVIDs:         1.80 cm LV PW:         1.20 cm LV IVS:        0.90 cm LVOT diam:     2.00 cm LV SV:         38 LV SV Index:   21 LVOT Area:     3.14 cm  LV Volumes (  MOD) LV vol d, MOD A2C: 26.8 ml LV vol d, MOD A4C: 36.2 ml LV vol s, MOD A2C: 8.5 ml LV vol s, MOD A4C: 16.3 ml LV SV MOD A2C:     18.3 ml LV SV MOD A4C:     36.2 ml LV SV MOD BP:      19.3 ml RIGHT VENTRICLE RV Basal diam:  2.70 cm RV Mid diam:    2.20 cm RV S prime:     12.00 cm/s TAPSE (M-mode): 1.3 cm LEFT ATRIUM             Index       RIGHT ATRIUM           Index LA diam:        4.30 cm 2.40 cm/m  RA Area:     25.10 cm LA Vol (A2C):   45.2 ml 25.26 ml/m RA Volume:   79.30 ml  44.31 ml/m LA Vol (A4C):   48.5 ml 27.10 ml/m LA Biplane Vol: 48.5 ml 27.10 ml/m  AORTIC VALVE                   PULMONIC VALVE AV Area (Vmax):    2.58 cm    PV Vmax:       0.72 m/s AV Area (Vmean):   2.43 cm    PV Vmean:      49.750 cm/s AV Area (VTI):     2.43 cm    PV VTI:        0.096 m AV Vmax:           116.00 cm/s PV Peak grad:  2.1 mmHg AV Vmean:          78.400 cm/s PV Mean grad:  1.0 mmHg AV VTI:            0.155 m AV Peak Grad:      5.4 mmHg AV Mean Grad:      3.0 mmHg LVOT Vmax:         95.20 cm/s LVOT Vmean:        60.700 cm/s LVOT VTI:          0.120 m LVOT/AV VTI ratio:  0.77  AORTA Ao Root diam: 2.90 cm Ao Asc diam:  2.40 cm TRICUSPID VALVE TR Peak grad:   23.8 mmHg TR Vmax:        244.00 cm/s  SHUNTS Systemic VTI:  0.12 m Systemic Diam: 2.00 cm Jenkins Rouge MD Electronically signed by Jenkins Rouge MD Signature Date/Time: 03/30/2021/1:04:33 PM    Final    HYBRID OR IMAGING ([place])  Result Date: 03/29/2021 There is no interpretation for this exam.  This order is for images obtained during a surgical procedure.  Please See "Surgeries" Tab for more information regarding the procedure.   CT Angio Abd/Pel W and/or Wo Contrast  Result Date: 03/29/2021 CLINICAL DATA:  Abdominal pain radiating to the chest. EXAM: CTA ABDOMEN AND PELVIS WITHOUT AND WITH CONTRAST TECHNIQUE: Multidetector CT imaging of the abdomen and pelvis was performed using the standard protocol during bolus administration of intravenous contrast. Multiplanar reconstructed images and MIPs were obtained and reviewed to evaluate the vascular anatomy. CONTRAST:  40m OMNIPAQUE IOHEXOL 350 MG/ML SOLN COMPARISON:  None. FINDINGS: VASCULAR Aorta: Moderate to marked severity calcification of a caliber aorta without aneurysm, dissection, vasculitis or significant stenosis. Celiac: Patent without evidence of aneurysm, dissection, vasculitis or significant stenosis. SMA: Occlusion of the distal aspect of the superior mesenteric artery  is seen (axial CT images 62 through 73, CT series 6). Enhancement of 1 of its distal branches is present. Renals: Both renal arteries are patent without evidence of aneurysm, dissection, vasculitis, fibromuscular dysplasia or significant stenosis. IMA: Not clearly visualized. Inflow: Moderate to marked severity diffuse calcification without evidence of aneurysm, dissection, vasculitis or significant stenosis. Proximal Outflow: Bilateral common femoral and visualized portions of the superficial and profunda femoral arteries are patent without evidence of aneurysm, dissection, vasculitis or  significant stenosis. Veins: No obvious venous abnormality within the limitations of this arterial phase study. Review of the MIP images confirms the above findings. NON-VASCULAR Lower chest: Mild to moderate severity scarring and/or atelectasis is seen along the posterior aspect of the bilateral lung bases. Hepatobiliary: A punctate calcified granuloma is seen within the anterior aspect of the right lobe of the liver. The gallbladder is not clearly identified and may be partially contracted. Pancreas: Unremarkable. No pancreatic ductal dilatation or surrounding inflammatory changes. Spleen: Normal in size without focal abnormality. Adrenals/Urinary Tract: Adrenal glands are unremarkable. Kidneys are normal in size, without obstructing renal calculi or hydronephrosis. Cortical scarring is seen along the lateral aspect of the mid left kidney. An adjacent 1.7 cm diameter cystic appearing area is noted. A 2 mm nonobstructing renal stone is seen within the mid to upper left kidney. Bladder is unremarkable. Stomach/Bowel: There is a large hiatal hernia. Appendix appears normal. No evidence of bowel wall thickening, distention, or inflammatory changes. Lymphatic: No abnormal abdominal or pelvic lymph nodes are identified. Reproductive: Uterus and bilateral adnexa are unremarkable. Other: A right lower quadrant ostomy site is seen. No abdominopelvic ascites. Musculoskeletal: There is evidence of prior vertebroplasty at the level of L3. Marked severity chronic and degenerative changes are also seen at the level of L2-L3. IMPRESSION: VASCULAR 1. 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 2. Moderate to marked severity diffuse calcification and atherosclerosis throughout the remaining arterial structures within the abdomen and pelvis. NON-VASCULAR 1. 2 mm nonobstructing left renal stone. 2. Postoperative changes with a subsequent right lower quadrant ostomy site. 3. Large hiatal  hernia. Electronically Signed   By: Virgina Norfolk M.D.   On: 03/29/2021 01:35    Assessment and Plan:   1. New onset atrial fibrillation, paroxysmal: -Pt presented as above with acute onset abdominal pain and hematochezia found to have acute mesenteric ischemia on abdominal CT. EKG showed NSR on arrival however she was found to paroxysmally in AF during her hospital course with elevated rates. She ultimately underwent successful recannulization and mechanical thrombectomy of the superior mesenteric artery with Dr. Trula Slade. She was placed on IV Heparin for AF given a CHA2DS2VASc score of at least 31 (age x2, female, and HTN). She was briefly left intubated after the procedure due to acidemia and risk for progressive bowel obstruction. She has since been extubated and her diet has been advanced and is taking PO medications well  -Per telemetry review, she is back in AF today with elevated rates in the 120's  -Currently anticoagulated with IV Heparin and will plan to transition to full dose Eliquis 71m BID -BPs running rather soft 105/59>>116/74>>102/47>>104/67 -Has been receiving IV metoprolol 2.546mQ6H with poor response   -Will add low dose metoprolol 12.66m28mO and following BPs  -Echocardiogram with normal LVEF and only moderately dilated RA/LA  -Stop ASA given the need for Eliquis  -May consider OP ZIO to follow AF burden.  -Can plan 3-4 weeks of uninterrupted AC and if  she remains in AF on follow up, can schedule OP DCCV   2. Acute mesenteric ischemia with SIRS: -Presented with acute abdominal pain found to have acute mesenteric ischemia treated with mechanical thrombectomy of the superior mesenteric artery with Dr. Trula Slade. -Unclear etiology>>could very well be due to undiagnosed atrial fibrillation versus known complex bowel hx with UC s/p prior colectomy and ileostomy -LA has since resolved>>WBC normalized  -Treated with IVF, ABX  -Continue Protonix   3. AKI>>resolved: -Admission Cr,  1.39>>1.35>>1.12 today  -Baseline appears to be in the 0.6 range  -No further recommendations   4. HTN: -Soft, 105/59>>116/74>>102/47>>104/67 -Will plan for rate control at this time for AF however will need to be cautious of softer BPs  -Add metoprolol tartrate 12.5 QD and follow BPs closely    For questions or updates, please contact [facility] Please consult [email or web address] for contact info under Cardiology/STEMI.   SignedKathyrn Drown NP-C [facility] Pager: [phone] 03/31/2021 9:16 AM

## 2021-03-31 NOTE — Progress Notes (Signed)
2 Days Post-Op  Subjective: Patient tolerated some diet yesterday with no nausea or increase in abdominal pain.  She actually denies abdominal pain.    ROS: See above, otherwise other systems negative  Objective: Vital signs in last 24 hours: Temp:  [98 F (36.7 C)-98.6 F (37 C)] 98 F (36.7 C) (08/17 0317) Pulse Rate:  [98-133] 127 (08/17 0600) Resp:  [15-22] 16 (08/17 0600) BP: (100-158)/(47-98) 105/59 (08/17 0600) SpO2:  [94 %-97 %] 94 % (08/17 0600) Last BM Date: 03/30/21  Intake/Output from previous day: 08/16 0701 - 08/17 0700 In: 603 [I.V.:503; IV Piggyback:100] Out: 2440 [Urine:1140; Stool:1300] Intake/Output this shift: No intake/output data recorded.  PE: Abd: soft, NT, ND, +BS, ileostomy with bilious output  Lab Results:  Recent Labs    03/30/21 2039 03/31/21 0436  WBC 12.3* 8.9  HGB 13.6 11.9*  HCT 41.0 35.2*  PLT 261 205   BMET Recent Labs    03/30/21 1620 03/31/21 0436  NA 132* 133*  K 4.3 3.8  CL 105 105  CO2 18* 22  GLUCOSE 145* 106*  BUN 27* 25*  CREATININE 1.35* 1.12*  CALCIUM 8.0* 7.8*   PT/INR Recent Labs    03/28/21 2300  LABPROT 13.7  INR 1.0   CMP     Component Value Date/Time   NA 133 (L) 03/31/2021 0436   K 3.8 03/31/2021 0436   CL 105 03/31/2021 0436   CO2 22 03/31/2021 0436   GLUCOSE 106 (H) 03/31/2021 0436   BUN 25 (H) 03/31/2021 0436   CREATININE 1.12 (H) 03/31/2021 0436   CALCIUM 7.8 (L) 03/31/2021 0436   PROT 4.6 (L) 03/31/2021 0436   ALBUMIN 2.2 (L) 03/31/2021 0436   AST 24 03/31/2021 0436   ALT 14 03/31/2021 0436   ALKPHOS 36 (L) 03/31/2021 0436   BILITOT 0.8 03/31/2021 0436   GFRNONAA 48 (L) 03/31/2021 0436   GFRAA 106 09/17/2008 0747   Lipase     Component Value Date/Time   LIPASE 50 03/28/2021 2300       Studies/Results: DG Chest Port 1 View  Result Date: 03/29/2021 CLINICAL DATA:  Endotracheal tube placement. EXAM: PORTABLE CHEST 1 VIEW COMPARISON:  March 28, 2021. FINDINGS: Stable  cardiomediastinal silhouette. Endotracheal and nasogastric tubes appear to be in grossly good position. Right internal jugular catheter is noted with distal tip in expected position of cavoatrial junction. No pneumothorax is noted. Mild bibasilar subsegmental atelectasis or edema is noted. Bony thorax is unremarkable. IMPRESSION: Endotracheal and nasogastric tubes appear to be in grossly good position. Right internal jugular catheter is noted with tip in expected position of cavoatrial junction. Mild bibasilar subsegmental atelectasis or edema is noted. Electronically Signed   By: Marijo Conception M.D.   On: 03/29/2021 11:37   DG Abd Portable 1V  Result Date: 03/29/2021 CLINICAL DATA:  Nasogastric tube placement. EXAM: PORTABLE ABDOMEN - 1 VIEW COMPARISON:  None. FINDINGS: The bowel gas pattern is normal. Distal tip of nasogastric tube is seen in the stomach. No radio-opaque calculi or other significant radiographic abnormality are seen. IMPRESSION: Distal tip of nasogastric tube seen in the stomach. No abnormal bowel dilatation is noted. Electronically Signed   By: Marijo Conception M.D.   On: 03/29/2021 11:38   ECHOCARDIOGRAM COMPLETE  Result Date: 03/30/2021    ECHOCARDIOGRAM REPORT   Patient Name:   Jasmine Mccormick [facility] Date of Exam: 03/30/2021 Medical Rec #:  [id number]          Height:  67.0 in Accession #:    [id number]         Weight:       150.0 lb Date of Birth:  [birth year]         BSA:          1.790 m Patient Age:    87 years           BP:           153/98 mmHg Patient Gender: F                  HR:           120 bpm. Exam Location:  Inpatient Procedure: 2D Echo, Cardiac Doppler and Color Doppler Indications:    AF  History:        Patient has no prior history of Echocardiogram examinations.                 Risk Factors:Hypertension and Dyslipidemia.  Sonographer:    Flasher Referring Phys: [id number] [place]  1. Patient in rapid afib during study.  2. Left ventricular  ejection fraction, by estimation, is 60 to 65%. The left ventricle has normal function. The left ventricle has no regional wall motion abnormalities. Left ventricular diastolic parameters are indeterminate.  3. Right ventricular systolic function is normal. The right ventricular size is normal. There is normal pulmonary artery systolic pressure.  4. Left atrial size was moderately dilated.  5. Right atrial size was moderately dilated.  6. The mitral valve is degenerative. Trivial mitral valve regurgitation. No evidence of mitral stenosis. Moderate mitral annular calcification.  7. The aortic valve is tricuspid. Aortic valve regurgitation is not visualized. Mild to moderate aortic valve sclerosis/calcification is present, without any evidence of aortic stenosis.  8. The inferior vena cava is normal in size with greater than 50% respiratory variability, suggesting right atrial pressure of 3 mmHg. FINDINGS  Left Ventricle: Left ventricular ejection fraction, by estimation, is 60 to 65%. The left ventricle has normal function. The left ventricle has no regional wall motion abnormalities. The left ventricular internal cavity size was normal in size. There is  no left ventricular hypertrophy. Left ventricular diastolic parameters are indeterminate. Right Ventricle: The right ventricular size is normal. No increase in right ventricular wall thickness. Right ventricular systolic function is normal. There is normal pulmonary artery systolic pressure. The tricuspid regurgitant velocity is 2.44 m/s, and  with an assumed right atrial pressure of 3 mmHg, the estimated right ventricular systolic pressure is 63.8 mmHg. Left Atrium: Left atrial size was moderately dilated. Right Atrium: Right atrial size was moderately dilated. Pericardium: There is no evidence of pericardial effusion. Mitral Valve: The mitral valve is degenerative in appearance. There is mild thickening of the mitral valve leaflet(s). There is mild calcification  of the mitral valve leaflet(s). Moderate mitral annular calcification. Trivial mitral valve regurgitation. No evidence of mitral valve stenosis. Tricuspid Valve: The tricuspid valve is normal in structure. Tricuspid valve regurgitation is mild . No evidence of tricuspid stenosis. Aortic Valve: The aortic valve is tricuspid. Aortic valve regurgitation is not visualized. Mild to moderate aortic valve sclerosis/calcification is present, without any evidence of aortic stenosis. Aortic valve mean gradient measures 3.0 mmHg. Aortic valve peak gradient measures 5.4 mmHg. Aortic valve area, by VTI measures 2.43 cm. Pulmonic Valve: The pulmonic valve was normal in structure. Pulmonic valve regurgitation is not visualized. No evidence of pulmonic stenosis. Aorta: The aortic root is normal in  size and structure. Venous: The inferior vena cava is normal in size with greater than 50% respiratory variability, suggesting right atrial pressure of 3 mmHg. IAS/Shunts: No atrial level shunt detected by color flow Doppler. Additional Comments: Patient in rapid afib during study.  LEFT VENTRICLE PLAX 2D LVIDd:         3.50 cm LVIDs:         1.80 cm LV PW:         1.20 cm LV IVS:        0.90 cm LVOT diam:     2.00 cm LV SV:         38 LV SV Index:   21 LVOT Area:     3.14 cm  LV Volumes (MOD) LV vol d, MOD A2C: 26.8 ml LV vol d, MOD A4C: 36.2 ml LV vol s, MOD A2C: 8.5 ml LV vol s, MOD A4C: 16.3 ml LV SV MOD A2C:     18.3 ml LV SV MOD A4C:     36.2 ml LV SV MOD BP:      19.3 ml RIGHT VENTRICLE RV Basal diam:  2.70 cm RV Mid diam:    2.20 cm RV S prime:     12.00 cm/s TAPSE (M-mode): 1.3 cm LEFT ATRIUM             Index       RIGHT ATRIUM           Index LA diam:        4.30 cm 2.40 cm/m  RA Area:     25.10 cm LA Vol (A2C):   45.2 ml 25.26 ml/m RA Volume:   79.30 ml  44.31 ml/m LA Vol (A4C):   48.5 ml 27.10 ml/m LA Biplane Vol: 48.5 ml 27.10 ml/m  AORTIC VALVE                   PULMONIC VALVE AV Area (Vmax):    2.58 cm    PV Vmax:        0.72 m/s AV Area (Vmean):   2.43 cm    PV Vmean:      49.750 cm/s AV Area (VTI):     2.43 cm    PV VTI:        0.096 m AV Vmax:           116.00 cm/s PV Peak grad:  2.1 mmHg AV Vmean:          78.400 cm/s PV Mean grad:  1.0 mmHg AV VTI:            0.155 m AV Peak Grad:      5.4 mmHg AV Mean Grad:      3.0 mmHg LVOT Vmax:         95.20 cm/s LVOT Vmean:        60.700 cm/s LVOT VTI:          0.120 m LVOT/AV VTI ratio: 0.77  AORTA Ao Root diam: 2.90 cm Ao Asc diam:  2.40 cm TRICUSPID VALVE TR Peak grad:   23.8 mmHg TR Vmax:        244.00 cm/s  SHUNTS Systemic VTI:  0.12 m Systemic Diam: 2.00 cm Jenkins Rouge MD Electronically signed by Jenkins Rouge MD Signature Date/Time: 03/30/2021/1:04:33 PM    Final     Anti-infectives: Anti-infectives (From admission, onward)    Start     Dose/Rate Route Frequency Ordered Stop   03/29/21 0445  piperacillin-tazobactam (ZOSYN) IVPB  3.375 g        3.375 g 12.5 mL/hr over 240 Minutes Intravenous Every 8 hours 03/29/21 0442          Assessment/Plan POD 2, s/p mechanical thrombectomy of SMA by Dr. Trula Slade -doing well from abdominal standpoint -ostomy working well.  Vascular to put on CLD today and advance as tolerates -zosyn x1 more day per vascular -benign exam.  No concerns for compromised bowel -no acute surgical needs.  We will sign off at this time.  Please call if needed  FEN - CLD VTE - heparin gtt ID - zosyn x1 more day per vascular   LOS: 2 days    Henreitta Cea , [facility] Surgery 03/31/2021, 8:16 AM Please see Amion for pager number during day hours 7:00am-4:30pm or 7:00am -11:30am on weekends

## 2021-03-31 NOTE — Progress Notes (Signed)
  Speech Language Pathology Treatment: Dysphagia  Patient Details Name: Jasmine Mccormick MRN: [id number] DOB: 22-Jan-1934 Today's Date: 03/31/2021 Time: 6270-3500 SLP Time Calculation (min) (ACUTE ONLY): 14 min  Assessment / Plan / Recommendation Clinical Impression  Pt's vocal quality sounds strong and clear to this SLP, although her family reports that it may be mildly hoarse compared to baseline. She has been cleared only for a clear liquid diet by vascular, so she was observed with thin liquid tray only. Oral prep was appropriate with jello texture, and she had only one delayed throat clear that was also associated with c/o globus sensation. Family reiterated that pt had been having increased "indigestion" PTA for which she would take Tums. Suspect that her oropharyngeal swallowing is relatively functional, but will follow along when she is able to be advanced to more solid foods to get a clearer clinical picture. Pt may still benefit from a more esophageal w/u and/or management of reported symptoms of indigestion (no h/o GERD documented in chart). Would advance up to mechanical soft diet at MD's discretion.    HPI HPI: Jasmine Mccormick is a 87 y.o. female with history of ulcerative colitis status post colectomy with ileostomy done more than 20 years ago with history of hypertension and hypothyroidism presents to the ER with sudden onset of abdominal pain and increased bloody stools through the ileostomy bag since 8 PM last night.  Patient's last food was around 4 PM.  Patient's pain is mostly around the epigastric area going to the back and chest.  Pt s/p mechanical thrombectomy, of the superior mesenteric artery, using penumbra device. ETT 8/15-8/16.      SLP Plan  Continue with current plan of care       Recommendations  Diet recommendations: Dysphagia 3 (mechanical soft);Thin liquid (when medically cleared by MD) Liquids provided via: Cup;Straw Medication Administration: Whole meds  with liquid Supervision: Patient able to self feed;Intermittent supervision to cue for compensatory strategies Compensations: Slow rate;Small sips/bites Postural Changes and/or Swallow Maneuvers: Seated upright 90 degrees;Upright 30-60 min after meal                Oral Care Recommendations: Oral care BID Follow up Recommendations: None (none anticipated from SLP) SLP Visit Diagnosis: Dysphagia, unspecified (R13.10) Plan: Continue with current plan of care       GO                Osie Bond., M.A. [place] [facility] Pager [phone] Office [phone]  03/31/2021, 1:53 PM

## 2021-03-31 NOTE — Evaluation (Signed)
Occupational Therapy Evaluation Patient Details Name: Jasmine Mccormick MRN: [id number] DOB: 12/15/1933 Today's Date: 03/31/2021    History of Present Illness Jasmine Mccormick is a 87 y.o. 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 Admitted for above and presenting with problem list below including generalized weakness, decreased activity tolerance, impaired balance, and decreased STM.  She currently requires min assist for transfers using RW, up to max assist for LB Adls and setup for UB ADLs.  She fatigues easily during session.  BP and SpO2 stable on RA, HR ranged from 111-130.  RN present during transfer for safety.  Will follow acutely, recommend continued OT services while admitted and after dc at [facility] level given 24/7 support.     Follow Up Recommendations  Home health OT;Supervision/Assistance - 24 hour    Equipment Recommendations  3 in 1 bedside commode    Recommendations for Other Services       Precautions / Restrictions Precautions Precautions: Fall Precaution Comments: watch HR Restrictions Weight Bearing Restrictions: No      Mobility Bed Mobility Overal bed mobility: Needs Assistance Bed Mobility: Supine to Sit     Supine to sit: Min assist     General bed mobility comments: min assist to elevate trunk and scoot to EOB, increased time and effort    Transfers Overall transfer level: Needs assistance Equipment used: Rolling walker (2 wheeled) Transfers: Sit to/from [facility] Sit to Stand: Min assist Stand pivot transfers: Min assist;+2 safety/equipment       General  transfer comment: min assist to power up from EOB with cueing for hand placement, pivoting to recliner with min assist; RN present for safety    Balance Overall balance assessment: Needs assistance Sitting-balance support: No upper extremity supported;Feet supported Sitting balance-Leahy Scale: Fair     Standing balance support: Bilateral upper extremity supported;During functional activity Standing balance-Leahy Scale: Poor Standing balance comment: relies on BUE support                           ADL either performed or assessed with clinical judgement   ADL Overall ADL's : Needs assistance/impaired     Grooming: Set up;Sitting           Upper Body Dressing : Set up;Sitting   Lower Body Dressing: Maximal assistance;Sit to/from stand Lower Body Dressing Details (indicate cue type and reason): requires assist for socks, min assist sit to stand Toilet Transfer: Minimal assistance;Stand-pivot;RW Toilet Transfer Details (indicate cue type and reason): simulated to recliner         Functional mobility during ADLs: Minimal assistance;Rolling walker;Cueing for safety General ADL Comments: pt limited by weakness, decreased activity tolerance, impaired balance     Vision   Vision Assessment?: No apparent visual deficits     Perception     Praxis      Pertinent Vitals/Pain Pain Assessment: Faces Faces Pain Scale: No hurt     Hand Dominance Right   Extremity/Trunk Assessment Upper Extremity Assessment Upper Extremity Assessment: Generalized weakness   Lower Extremity Assessment Lower Extremity Assessment: Defer to PT evaluation   Cervical /  Trunk Assessment Cervical / Trunk Assessment: Kyphotic   Communication Communication Communication: HOH   Cognition Arousal/Alertness: Awake/alert Behavior During Therapy: WFL for tasks assessed/performed Overall Cognitive Status: Impaired/Different from baseline Area of Impairment: Memory                      Memory: Decreased short-term memory         General Comments: decreased recall of getting OOB yesterday with PT, pleasant and cooperative.   General Comments  pt on RA with Spo2 stable, HR 111-130 (with RN providing medication for afib priro to session)    Exercises     Shoulder Instructions      Home Living Family/patient expects to be discharged to:: Private residence Living Arrangements: Alone Available Help at Discharge: Family;Available 24 hours/day;Other (Comment) Type of Home: House Home Access: Stairs to enter Entrance Stairs-Number of Steps: 3   Home Layout: One level;Laundry or work area in basement     [employer]: Occupational psychologist: [place]: [employer] - quad;Grab bars - tub/shower          Prior Functioning/Environment Level of Independence: Needs assistance  Gait / Transfers Assistance Needed: Typially walks with cane; tells me her PCP asked her to get a RW ADL's / Homemaking Assistance Needed: has been managing her own ileostomy for over 20 years, independent ADLs, IADLs and driving Communication / Swallowing Assistance Needed: HOH          OT Problem List: Decreased strength;Decreased activity tolerance;Impaired balance (sitting and/or standing);Decreased safety awareness;Decreased knowledge of use of DME or AE;Decreased knowledge of precautions;Cardiopulmonary status limiting activity;Decreased cognition      OT Treatment/Interventions: Self-care/ADL training;DME and/or AE instruction;Therapeutic activities;Cognitive remediation/compensation;Patient/family education;Balance training;Energy conservation;Therapeutic exercise    OT Goals(Current goals can be found in the care plan section) Acute Rehab OT Goals Patient Stated Goal: hopes to get home OT Goal Formulation: With patient Time For Goal Achievement: 04/14/21 Potential to Achieve Goals: Good  OT Frequency: Min 2X/week   Barriers to D/C:             Co-evaluation              AM-PAC OT "6 Clicks" Daily Activity     Outcome Measure Help from another person eating meals?: A Little Help from another person taking care of personal grooming?: A Little Help from another person toileting, which includes using toliet, bedpan, or urinal?: Total Help from another person bathing (including washing, rinsing, drying)?: A Lot Help from another person to put on and taking off regular upper body clothing?: A Little Help from another person to put on and taking off regular lower body clothing?: A Lot 6 Click Score: 14   End of Session Equipment Utilized During Treatment: Rolling walker Nurse Communication: Mobility status;Other (comment) (IV leaking)  Activity Tolerance: Patient tolerated treatment well Patient left: in chair;with call bell/phone within reach;with chair alarm set;with nursing/sitter in room;with family/visitor present  OT Visit Diagnosis: Other abnormalities of gait and mobility (R26.89);Muscle weakness (generalized) (M62.81);Other symptoms and signs involving cognitive function                Time: 9379-0240 OT Time Calculation (min): 19 min Charges:  OT General Charges $OT Visit: 1 Visit OT Evaluation $OT Eval Moderate Complexity: 1 Mod  Jolaine Artist, OT Acute Rehabilitation Services Pager [phone] Office [phone]   Delight Stare 03/31/2021, 1:57 PM

## 2021-03-31 NOTE — Progress Notes (Signed)
Physical Therapy Treatment Patient Details Name: Jasmine Mccormick MRN: [id number] DOB: [birth year] Today's Date: 03/31/2021    History of Present Illness Jasmine Mccormick is a 87 y.o. 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 Pt required repeated verbal cueing for hand placement during transfers which needs reinforcement. Continue to recommend [place], as pt is progressing but requires assistance with transfers and ambulation. Discussed d/c recommendation and continue to suggest 24hr support. Pt discussing with family.   Follow Up Recommendations  Home health PT;Supervision/Assistance - 24 hour     Equipment Recommendations  Rolling walker with 5" wheels;3in1 (PT)    Recommendations for Other Services       Precautions / Restrictions Precautions Precautions: Fall Precaution Comments: watch HR Restrictions Weight Bearing Restrictions: No    Mobility  Bed Mobility               General bed mobility comments: In recliner upon arrival    Transfers Overall transfer level: Needs assistance Equipment used: Rolling walker (2 wheeled) Transfers: Sit to/from Stand Sit to Stand: Min assist;+2 safety/equipment         General transfer comment: Performed sit to stand x2 (1st trial from recliner and 2nd from chair in hallway). Both trials required min A to power up to standing, with +2 from chair in hallway for safety to hold the chair and power up to stand from a lower surface. Verbal cues provided for hand  placement on chair and RW when performing transfers.  Ambulation/Gait Ambulation/Gait assistance: Min guard;+2 safety/equipment Gait Distance (Feet): 105 Feet Assistive device: Rolling walker (2 wheeled) Gait Pattern/deviations: Step-through pattern;Trunk flexed;Decreased stride length Gait velocity: Decreased Gait velocity interpretation: 1.31 - 2.62 ft/sec, indicative of limited community ambulator General Gait Details: Ambulated a total of 277f with seated rest half way through. Verbal cues provided for upright posture and keeping hips in RW.   Stairs             Wheelchair Mobility    Modified Rankin (Stroke Patients Only)       Balance           Standing balance support: Bilateral upper extremity supported;During functional activity Standing balance-Leahy Scale: Poor Standing balance comment: Requires BUE support                            Cognition Arousal/Alertness: Awake/alert Behavior During Therapy: WFL for tasks assessed/performed Overall Cognitive Status: Within Functional Limits for tasks assessed                                        Exercises General Exercises - Lower Extremity Long Arc Quad: AROM;Both;10 reps;Seated Hip Flexion/Marching: Strengthening;Both;10 reps;Seated;Other (comment) (Against external resistance from PT)    General Comments General comments (skin integrity, edema, etc.): HR 122-137 with spO2 97-100% on RA throughout session      Pertinent Vitals/Pain Pain Assessment: No/denies pain    Home  Living                      Prior Function            PT Goals (current goals can now be found in the care plan section) Acute Rehab PT Goals Patient Stated Goal: hopes to get home PT Goal Formulation: With patient Time For Goal Achievement: 04/13/21 Potential to Achieve Goals: Good Progress towards PT goals: Progressing toward goals    Frequency    Min 3X/week      PT Plan  Current plan remains appropriate    Co-evaluation              AM-PAC PT "6 Clicks" Mobility   Outcome Measure  Help needed turning from your back to your side while in a flat bed without using bedrails?: A Little Help needed moving from lying on your back to sitting on the side of a flat bed without using bedrails?: A Little Help needed moving to and from a bed to a chair (including a wheelchair)?: A Little Help needed standing up from a chair using your arms (e.g., wheelchair or bedside chair)?: A Little Help needed to walk in hospital room?: A Little Help needed climbing 3-5 steps with a railing? : A Lot 6 Click Score: 17    End of Session Equipment Utilized During Treatment: Gait belt Activity Tolerance: Patient limited by fatigue Patient left: in chair;with call bell/phone within reach;with chair alarm set Nurse Communication: Mobility status PT Visit Diagnosis: Unsteadiness on feet (R26.81);Other abnormalities of gait and mobility (R26.89);Muscle weakness (generalized) (M62.81)     Time: 4917-9150 PT Time Calculation (min) (ACUTE ONLY): 30 min  Charges:  $Gait Training: 8-22 mins $Therapeutic Activity: 8-22 mins                     Louie Casa, SPT Acute Rehab: [phone]      Domingo Dimes 03/31/2021, 5:11 PM

## 2021-03-31 NOTE — Progress Notes (Signed)
[place] for heparin + warfarin  Indication: atrial fibrillation  No Known Allergies  Patient Measurements: Height: 5' 7"  (170.2 cm) Weight: 68 kg (150 lb) IBW/kg (Calculated) : 61.6 Heparin Dosing Weight: 68 kg   Vital Signs: Temp: 97.5 F (36.4 C) (08/17 1147) Temp Source: Oral (08/17 1147) BP: 120/71 (08/17 1300) Pulse Rate: 111 (08/17 1300)  Labs: Recent Labs    03/28/21 2300 03/28/21 2356 03/29/21 0156 03/29/21 0235 03/30/21 0435 03/30/21 0641 03/30/21 1356 03/30/21 1600 03/30/21 1620 03/30/21 2039 03/31/21 0436  HGB 13.4   < >  --    < > 14.4  --  13.8  --   --  13.6 11.9*  HCT 40.5   < >  --    < > 42.1  --  40.8  --   --  41.0 35.2*  PLT 287  --   --    < > 268  --  251  --   --  261 205  APTT 27  --   --   --   --   --   --   --   --   --   --   LABPROT 13.7  --   --   --   --   --   --   --   --   --   --   INR 1.0  --   --   --   --   --   --   --   --   --   --   HEPARINUNFRC  --   --   --    < >  --  0.68  --  0.27*  --   --  0.26*  CREATININE 0.86   < >  --    < > 1.39*  --   --   --  1.35*  --  1.12*  TROPONINIHS 9  --  9  --   --   --   --   --   --   --   --    < > = values in this interval not displayed.     Estimated Creatinine Clearance: 36.4 mL/min (A) (by C-G formula based on SCr of 1.12 mg/dL (H)).   Medical History: Past Medical History:  Diagnosis Date   COVID 12/18/2020   mild   HTN (hypertension)    Normal at home   Hyperlipidemia    Hypothyroidism    Ileostomy in place [facility])    Memory loss    mild but not dx by doctor per [facility]   Meniere disease 1996   Osteoporosis    UC (ulcerative colitis) ([place])     Medications:  Scheduled:   apixaban  5 mg Oral BID   aspirin  81 mg Oral Daily   chlorhexidine gluconate (MEDLINE KIT)  15 mL Mouth Rinse BID   Chlorhexidine Gluconate Cloth  6 each Topical Daily   ezetimibe  10 mg Oral Daily   And   simvastatin  20 mg Oral q1800   [START ON  04/01/2021] levothyroxine  75 mcg Oral Q0600   metoprolol tartrate  12.5 mg Oral BID   pantoprazole  40 mg Oral BID   potassium chloride  20 mEq Oral BID   sodium chloride flush  3 mL Intravenous Q12H    Assessment: 34 yof 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 Now s/p mechanical thrombectomy on  8/15. No AC PTA.  Was going to change to apixaban however, given no prescription drug coverage and ineligibility for patient assistance given income - will need to change to warfarin at discharge. Cardiology team aware.   Plan to start warfarin tonight - discussed with cardiology and will need bridge until INR therapeutic so heparin infusion will restart 12 hours after apixaban dose (8/17@1257 ). Hgb 11.9, plt 205. No s/sx of bleeding.   Will monitor both aPTT and heparin level until correlate given recent DOAC.  Goal of Therapy:  Heparin level 0.3-0.7 units/ml Monitor platelets by anticoagulation protocol: Yes   Plan:  Order warfarin 5 mg tonight  Restart heparin infusion at 8/18@0100  >> order heparin level/aPTT 8 hours after restart Monitor daily HL, CBC, and for s/sx of bleeding   Antonietta Jewel, PharmD, BCCCP Clinical Pharmacist  Phone: [phone] 03/31/2021 2:23 PM  Please check AMION for all [place] phone numbers After 10:00 PM, call [place] [phone]

## 2021-04-01 DIAGNOSIS — R112 Nausea with vomiting, unspecified: Secondary | ICD-10-CM

## 2021-04-01 DIAGNOSIS — K55069 Acute infarction of intestine, part and extent unspecified: Secondary | ICD-10-CM | POA: Diagnosis not present

## 2021-04-01 DIAGNOSIS — I480 Paroxysmal atrial fibrillation: Secondary | ICD-10-CM | POA: Diagnosis not present

## 2021-04-01 DIAGNOSIS — E7800 Pure hypercholesterolemia, unspecified: Secondary | ICD-10-CM | POA: Diagnosis not present

## 2021-04-01 LAB — PROTIME-INR
INR: 1.4 — ABNORMAL HIGH (ref 0.8–1.2)
Prothrombin Time: 17.3 seconds — ABNORMAL HIGH (ref 11.4–15.2)

## 2021-04-01 LAB — BASIC METABOLIC PANEL
Anion gap: 9 (ref 5–15)
BUN: 16 mg/dL (ref 8–23)
CO2: 24 mmol/L (ref 22–32)
Calcium: 8.5 mg/dL — ABNORMAL LOW (ref 8.9–10.3)
Chloride: 99 mmol/L (ref 98–111)
Creatinine, Ser: 0.93 mg/dL (ref 0.44–1.00)
GFR, Estimated: >60 mL/min (ref >60)
Glucose, Bld: 99 mg/dL (ref 70–99)
Potassium: 4.1 mmol/L (ref 3.5–5.1)
Sodium: 132 mmol/L — ABNORMAL LOW (ref 135–145)

## 2021-04-01 LAB — LIPID PANEL
Cholesterol: 122 mg/dL (ref 0–200)
HDL: 38 mg/dL — ABNORMAL LOW (ref >40)
LDL Cholesterol: 60 mg/dL (ref 0–99)
Total CHOL/HDL Ratio: 3.2 RATIO
Triglycerides: 119 mg/dL (ref <150)
VLDL: 24 mg/dL (ref 0–40)

## 2021-04-01 LAB — CBC
HCT: 34.1 % — ABNORMAL LOW (ref 36.0–46.0)
Hemoglobin: 11.2 g/dL — ABNORMAL LOW (ref 12.0–15.0)
MCH: 31.5 pg (ref 26.0–34.0)
MCHC: 32.8 g/dL (ref 30.0–36.0)
MCV: 96.1 fL (ref 80.0–100.0)
Platelets: 222 10*3/uL (ref 150–400)
RBC: 3.55 MIL/uL — ABNORMAL LOW (ref 3.87–5.11)
RDW: 12.8 % (ref 11.5–15.5)
WBC: 9.3 10*3/uL (ref 4.0–10.5)
nRBC: 0.3 % — ABNORMAL HIGH (ref 0.0–0.2)

## 2021-04-01 LAB — HEPARIN LEVEL (UNFRACTIONATED)
Heparin Unfractionated: >1.1 IU/mL — ABNORMAL HIGH (ref 0.30–0.70)
Heparin Unfractionated: 0.79 IU/mL — ABNORMAL HIGH (ref 0.30–0.70)

## 2021-04-01 LAB — APTT
aPTT: 51 seconds — ABNORMAL HIGH (ref 24–36)
aPTT: 85 seconds — ABNORMAL HIGH (ref 24–36)

## 2021-04-01 MED ORDER — METOPROLOL TARTRATE 12.5 MG HALF TABLET
12.5000 mg | ORAL_TABLET | Freq: Four times a day (QID) | ORAL | Status: DC
  Administered 2021-04-01 – 2021-04-02 (×4): 12.5 mg via ORAL
  Filled 2021-04-01 (×4): qty 1

## 2021-04-01 MED ORDER — WARFARIN SODIUM 5 MG PO TABS
5.0000 mg | ORAL_TABLET | Freq: Once | ORAL | Status: AC
  Administered 2021-04-01: 5 mg via ORAL
  Filled 2021-04-01: qty 1

## 2021-04-01 NOTE — Progress Notes (Signed)
ANTICOAGULATION CONSULT NOTE   Pharmacy Consult for heparin + warfarin  Indication: atrial fibrillation  No Known Allergies  Patient Measurements: Height: 5' 7"  (170.2 cm) Weight: 78.5 kg (173 lb 1 oz) IBW/kg (Calculated) : 61.6 Heparin Dosing Weight: 68 kg   Vital Signs: Temp: 98.1 F (36.7 C) (08/18 1200) Temp Source: Oral (08/18 1200) BP: 123/90 (08/18 1100) Pulse Rate: 115 (08/18 1100)  Labs: Recent Labs    03/30/21 1600 03/30/21 1620 03/30/21 2039 03/31/21 0436 03/31/21 1615 04/01/21 0410 04/01/21 0905  HGB  --   --  13.6 11.9*  --  11.2*  --   HCT  --   --  41.0 35.2*  --  34.1*  --   PLT  --   --  261 205  --  222  --   APTT  --   --   --   --   --   --  51*  LABPROT  --   --   --   --   --  17.3*  --   INR  --   --   --   --   --  1.4*  --   HEPARINUNFRC 0.27*  --   --  0.26*  --   --  >1.10*  CREATININE  --    < >  --  1.12* 1.07* 0.93  --    < > = values in this interval not displayed.     Estimated Creatinine Clearance: 48.6 mL/min (by C-G formula based on SCr of 0.93 mg/dL).   Medical History: Past Medical History:  Diagnosis Date   COVID 12/18/2020   mild   HTN (hypertension)    Normal at home   Hyperlipidemia    Hypothyroidism    Ileostomy in place [facility])    Memory loss    mild but not dx by doctor per Neice   Meniere disease 1996   Osteoporosis    UC (ulcerative colitis) ([place])     Medications:  Scheduled:   Chlorhexidine Gluconate Cloth  6 each Topical Daily   ezetimibe  10 mg Oral Daily   And   simvastatin  20 mg Oral q1800   levothyroxine  75 mcg Oral Q0600   metoprolol tartrate  12.5 mg Oral Q6H   pantoprazole  40 mg Oral BID   sodium chloride flush  3 mL Intravenous Q12H   Warfarin - Pharmacist Dosing Inpatient   Does not apply q1600    Assessment: 89 yof 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 Now s/p mechanical thrombectomy on 8/15. No AC PTA.  Was going to change to apixaban however, given no prescription  drug coverage and ineligibility for patient assistance given income - will need to change to warfarin at discharge. Cardiology team aware.   Plan to start warfarin last night - discussed with cardiology and will need bridge until INR therapeutic.   Will monitor both aPTT and heparin level until correlate given recent DOAC.  HL this morning >1.10 (supratherapeutic given DOAC yesterday) and aPTT 51 (subtherapeutic) on 850 units/hr heparin gtt.   Goal of Therapy:  Heparin level 0.3-0.7 units/ml Monitor platelets by anticoagulation protocol: Yes   Plan:  Order warfarin 5 mg tonight  Increase heparin drip to 1000 units/hr. aPTT/HL in 6 hours Monitor daily HL, aPTT, CBC, and for s/sx of bleeding   Cathrine Muster, PharmD PGY2 Cardiology Pharmacy Resident Phone: [phone] 04/01/2021  1:39 PM  Please check AMION for all [facility]  Pharmacy phone numbers After 10:00 PM, call [place] [phone]

## 2021-04-01 NOTE — Progress Notes (Addendum)
  Progress Note    04/01/2021 7:57 AM 3 Days Post-Op  Subjective:  Feeling sick to her stomach this morning with mild nausea.  She tolerated CLD yesterday   Vitals:   04/01/21 0600 04/01/21 0700  BP: 128/80 (!) 139/94  Pulse: (!) 108 (!) 130  Resp: 16 (!) 22  Temp:    SpO2: 94% 97%   Physical Exam: Lungs:  non labored Incisions:  groin without palpable hematoma Extremities:  feet warm and well perfused; symmetrical radial pulses Abdomen:  soft, non tender but somewhat distended Neurologic: A&O  CBC    Component Value Date/Time   WBC 9.3 04/01/2021 0410   RBC 3.55 (L) 04/01/2021 0410   HGB 11.2 (L) 04/01/2021 0410   HCT 34.1 (L) 04/01/2021 0410   PLT 222 04/01/2021 0410   MCV 96.1 04/01/2021 0410   MCH 31.5 04/01/2021 0410   MCHC 32.8 04/01/2021 0410   RDW 12.8 04/01/2021 0410   LYMPHSABS 1.1 03/28/2021 2300   MONOABS 0.8 03/28/2021 2300   EOSABS 0.2 03/28/2021 2300   BASOSABS 0.1 03/28/2021 2300    BMET    Component Value Date/Time   NA 132 (L) 04/01/2021 0410   K 4.1 04/01/2021 0410   CL 99 04/01/2021 0410   CO2 24 04/01/2021 0410   GLUCOSE 99 04/01/2021 0410   BUN 16 04/01/2021 0410   CREATININE 0.93 04/01/2021 0410   CALCIUM 8.5 (L) 04/01/2021 0410   GFRNONAA >60 04/01/2021 0410   GFRAA 106 09/17/2008 0747    INR    Component Value Date/Time   INR 1.4 (H) 04/01/2021 0410     Intake/Output Summary (Last 24 hours) at 04/01/2021 0757 Last data filed at 04/01/2021 0700 Gross per 24 hour  Intake 1199.92 ml  Output 3550 ml  Net -2350.08 ml     Assessment/Plan:  87 y.o. female is s/p mechanical thrombectomy of SMA 3 Days Post-Op   Extremities are warm and well perfused; no hematoma R groin cath site Cardiology initiated warfarin for a fib, low dosemetoprolol for rate control Renal: good urine output; Cr now 0.93 GI: tolerated CLD yesterday however feels sick to her stomach with some nausea; abd exam with distention but soft, continue CLD this  morning Can probably transfer to 4e today     Dagoberto Ligas, PA-C [facility] [phone] 04/01/2021 7:57 AM  I agree with the above.  Have seen and evaluated patient.  She is complaining of some nausea this morning.  She does have air in her ostomy bag and her abdomen remains soft.  We will watch her closely throughout the day.  She potentially could go to 4 E. later this afternoon.  I will keep her on heparin until we can transition her safely.  Annamarie Major

## 2021-04-01 NOTE — Progress Notes (Signed)
Progress Note  Patient Name: Jasmine Mccormick Date of Encounter: 04/01/2021  [facility] Cardiologist: Buford Dresser, MD   Subjective   No acute events overnight. Remains in atrial fibrillation, range 100-130 bpm. She cannot tell that she is in afib, no chest pain or shortness of breath. She just ate breakfast this AM, feels mildly nauseated but no emesis. Has not noted any bleeding issues.  Inpatient Medications    Scheduled Meds:  chlorhexidine gluconate (MEDLINE KIT)  15 mL Mouth Rinse BID   Chlorhexidine Gluconate Cloth  6 each Topical Daily   ezetimibe  10 mg Oral Daily   And   simvastatin  20 mg Oral q1800   levothyroxine  75 mcg Oral Q0600   metoprolol tartrate  12.5 mg Oral BID   pantoprazole  40 mg Oral BID   sodium chloride flush  3 mL Intravenous Q12H   Warfarin - Pharmacist Dosing Inpatient   Does not apply q1600   Continuous Infusions:  sodium chloride     heparin 850 Units/hr (04/01/21 0700)   piperacillin-tazobactam (ZOSYN)  IV 12.5 mL/hr at 04/01/21 0700   promethazine (PHENERGAN) injection (IM or IVPB) 200 mL/hr at 03/31/21 0900   PRN Meds: sodium chloride, acetaminophen, hydrALAZINE, hydrALAZINE, labetalol, ondansetron (ZOFRAN) IV, promethazine (PHENERGAN) injection (IM or IVPB), sodium chloride flush   Vital Signs    Vitals:   04/01/21 0500 04/01/21 0600 04/01/21 0700 04/01/21 0800  BP: 135/74 128/80 (!) 139/94   Pulse: 94 (!) 108 (!) 130   Resp: 15 16 (!) 22   Temp:    97.9 F (36.6 C)  TempSrc:    Oral  SpO2: 95% 94% 97%   Weight:   78.5 kg   Height:        Intake/Output Summary (Last 24 hours) at 04/01/2021 0816 Last data filed at 04/01/2021 0700 Gross per 24 hour  Intake 1199.92 ml  Output 3125 ml  Net -1925.08 ml   Last 3 Weights 04/01/2021 03/28/2021 03/28/2021  Weight (lbs) 173 lb 1 oz 150 lb 160 lb  Weight (kg) 78.5 kg 68.04 kg 72.576 kg      Telemetry    Atrial fibrillation, rates 100s-130s, largely 110-120 -  Personally Reviewed  ECG    No new since 03/29/21 - Personally Reviewed  Physical Exam   GEN: No acute distress.  Sitting comfortably in chair, just finished breakfast Neck: No JVD Cardiac: tachycardic, irregularly irregular rhythm, no murmurs, rubs, or gallops appreciated.  Respiratory: Clear to auscultation bilaterally. GI: Soft, nontender, non-distended  MS: Trivial bilateral LE edema; No deformity. Neuro:  Nonfocal  Psych: Normal affect   Labs    High Sensitivity Troponin:   Recent Labs  Lab 03/28/21 2300 03/29/21 0156  TROPONINIHS 9 9      Chemistry Recent Labs  Lab 03/30/21 1620 03/31/21 0436 03/31/21 1615 04/01/21 0410  NA 132* 133* 132* 132*  K 4.3 3.8 4.2 4.1  CL 105 105 104 99  CO2 18* 22 23 24   GLUCOSE 145* 106* 121* 99  BUN 27* 25* 20 16  CREATININE 1.35* 1.12* 1.07* 0.93  CALCIUM 8.0* 7.8* 8.2* 8.5*  PROT 5.3* 4.6* 5.1*  --   ALBUMIN 2.5* 2.2* 2.3*  --   AST 31 24 26   --   ALT 16 14 15   --   ALKPHOS 41 36* 40  --   BILITOT 1.0 0.8 0.9  --   GFRNONAA 39* 48* 51* >60  ANIONGAP 9 6 5  9  Hematology Recent Labs  Lab 03/30/21 2039 03/31/21 0436 04/01/21 0410  WBC 12.3* 8.9 9.3  RBC 4.24 3.62* 3.55*  HGB 13.6 11.9* 11.2*  HCT 41.0 35.2* 34.1*  MCV 96.7 97.2 96.1  MCH 32.1 32.9 31.5  MCHC 33.2 33.8 32.8  RDW 12.9 13.0 12.8  PLT 261 205 222    BNPNo results for input(s): BNP, PROBNP in the last 168 hours.   DDimer No results for input(s): DDIMER in the last 168 hours.   Radiology    ECHOCARDIOGRAM COMPLETE  Result Date: 03/30/2021    ECHOCARDIOGRAM REPORT   Patient Name:   KATHEY SIMER Fierro Date of Exam: 03/30/2021 Medical Rec #:  [id number]          Height:       67.0 in Accession #:    [id number]         Weight:       150.0 lb Date of Birth:  24-Sep-1933         BSA:          1.790 m Patient Age:    61 years           BP:           153/98 mmHg Patient Gender: F                  HR:           120 bpm. Exam Location:  Inpatient  Procedure: 2D Echo, Cardiac Doppler and Color Doppler Indications:    AF  History:        Patient has no prior history of Echocardiogram examinations.                 Risk Factors:Hypertension and Dyslipidemia.  Sonographer:    [place] Referring Phys: [id number] [place]  1. Patient in rapid afib during study.  2. Left ventricular ejection fraction, by estimation, is 60 to 65%. The left ventricle has normal function. The left ventricle has no regional wall motion abnormalities. Left ventricular diastolic parameters are indeterminate.  3. Right ventricular systolic function is normal. The right ventricular size is normal. There is normal pulmonary artery systolic pressure.  4. Left atrial size was moderately dilated.  5. Right atrial size was moderately dilated.  6. The mitral valve is degenerative. Trivial mitral valve regurgitation. No evidence of mitral stenosis. Moderate mitral annular calcification.  7. The aortic valve is tricuspid. Aortic valve regurgitation is not visualized. Mild to moderate aortic valve sclerosis/calcification is present, without any evidence of aortic stenosis.  8. The inferior vena cava is normal in size with greater than 50% respiratory variability, suggesting right atrial pressure of 3 mmHg. FINDINGS  Left Ventricle: Left ventricular ejection fraction, by estimation, is 60 to 65%. The left ventricle has normal function. The left ventricle has no regional wall motion abnormalities. The left ventricular internal cavity size was normal in size. There is  no left ventricular hypertrophy. Left ventricular diastolic parameters are indeterminate. Right Ventricle: The right ventricular size is normal. No increase in right ventricular wall thickness. Right ventricular systolic function is normal. There is normal pulmonary artery systolic pressure. The tricuspid regurgitant velocity is 2.44 m/s, and  with an assumed right atrial pressure of 3 mmHg, the estimated right  ventricular systolic pressure is 28.7 mmHg. Left Atrium: Left atrial size was moderately dilated. Right Atrium: Right atrial size was moderately dilated. Pericardium: There is no evidence of pericardial effusion. Mitral Valve: The  mitral valve is degenerative in appearance. There is mild thickening of the mitral valve leaflet(s). There is mild calcification of the mitral valve leaflet(s). Moderate mitral annular calcification. Trivial mitral valve regurgitation. No evidence of mitral valve stenosis. Tricuspid Valve: The tricuspid valve is normal in structure. Tricuspid valve regurgitation is mild . No evidence of tricuspid stenosis. Aortic Valve: The aortic valve is tricuspid. Aortic valve regurgitation is not visualized. Mild to moderate aortic valve sclerosis/calcification is present, without any evidence of aortic stenosis. Aortic valve mean gradient measures 3.0 mmHg. Aortic valve peak gradient measures 5.4 mmHg. Aortic valve area, by VTI measures 2.43 cm. Pulmonic Valve: The pulmonic valve was normal in structure. Pulmonic valve regurgitation is not visualized. No evidence of pulmonic stenosis. Aorta: The aortic root is normal in size and structure. Venous: The inferior vena cava is normal in size with greater than 50% respiratory variability, suggesting right atrial pressure of 3 mmHg. IAS/Shunts: No atrial level shunt detected by color flow Doppler. Additional Comments: Patient in rapid afib during study.  LEFT VENTRICLE PLAX 2D LVIDd:         3.50 cm LVIDs:         1.80 cm LV PW:         1.20 cm LV IVS:        0.90 cm LVOT diam:     2.00 cm LV SV:         38 LV SV Index:   21 LVOT Area:     3.14 cm  LV Volumes (MOD) LV vol d, MOD A2C: 26.8 ml LV vol d, MOD A4C: 36.2 ml LV vol s, MOD A2C: 8.5 ml LV vol s, MOD A4C: 16.3 ml LV SV MOD A2C:     18.3 ml LV SV MOD A4C:     36.2 ml LV SV MOD BP:      19.3 ml RIGHT VENTRICLE RV Basal diam:  2.70 cm RV Mid diam:    2.20 cm RV S prime:     12.00 cm/s TAPSE (M-mode):  1.3 cm LEFT ATRIUM             Index       RIGHT ATRIUM           Index LA diam:        4.30 cm 2.40 cm/m  RA Area:     25.10 cm LA Vol (A2C):   45.2 ml 25.26 ml/m RA Volume:   79.30 ml  44.31 ml/m LA Vol (A4C):   48.5 ml 27.10 ml/m LA Biplane Vol: 48.5 ml 27.10 ml/m  AORTIC VALVE                   PULMONIC VALVE AV Area (Vmax):    2.58 cm    PV Vmax:       0.72 m/s AV Area (Vmean):   2.43 cm    PV Vmean:      49.750 cm/s AV Area (VTI):     2.43 cm    PV VTI:        0.096 m AV Vmax:           116.00 cm/s PV Peak grad:  2.1 mmHg AV Vmean:          78.400 cm/s PV Mean grad:  1.0 mmHg AV VTI:            0.155 m AV Peak Grad:      5.4 mmHg AV Mean Grad:  3.0 mmHg LVOT Vmax:         95.20 cm/s LVOT Vmean:        60.700 cm/s LVOT VTI:          0.120 m LVOT/AV VTI ratio: 0.77  AORTA Ao Root diam: 2.90 cm Ao Asc diam:  2.40 cm TRICUSPID VALVE TR Peak grad:   23.8 mmHg TR Vmax:        244.00 cm/s  SHUNTS Systemic VTI:  0.12 m Systemic Diam: 2.00 cm Jenkins Rouge MD Electronically signed by Jenkins Rouge MD Signature Date/Time: 03/30/2021/1:04:33 PM    Final     Cardiac Studies   Echocardiogram 03/30/21:   1. Patient in rapid afib during study.   2. Left ventricular ejection fraction, by estimation, is 60 to 65%. The  left ventricle has normal function. The left ventricle has no regional  wall motion abnormalities. Left ventricular diastolic parameters are  indeterminate.   3. Right ventricular systolic function is normal. The right ventricular  size is normal. There is normal pulmonary artery systolic pressure.   4. Left atrial size was moderately dilated.   5. Right atrial size was moderately dilated.   6. The mitral valve is degenerative. Trivial mitral valve regurgitation.  No evidence of mitral stenosis. Moderate mitral annular calcification.   7. The aortic valve is tricuspid. Aortic valve regurgitation is not  visualized. Mild to moderate aortic valve sclerosis/calcification is  present,  without any evidence of aortic stenosis.   8. The inferior vena cava is normal in size with greater than 50%  respiratory variability, suggesting right atrial pressure of 3 mmHg.   Patient Profile     87 y.o. female with complex history, including ulcerative colitis s/p colectomy with ileostomy placement >20 years ago, HTN, and hypothyroidism, notable for recent distal SMA occlusion s/p thrombectomy  Assessment & Plan    Paroxysmal atrial fibrillation, unknown chronicity, new diagnosis -concern that SMA occlusion may have been embolic. Now on anticoagulation.  -chadsvasc at least 4 -tried to get apixaban covered for her, but very expensive. Transitioning to coumadin -blood pressure improved, tolerating low dose metoprolol, but rates still ranging from 100-130s. Will increased metoprolol from 12.5 mg twice daily to 12.5 mg four times daily. -given nausea this AM, will hold on amiodarone -she is asymptomatic with her afib RVR -no aspirin now that she is on apixaban -aim for K >4, Mg >2 -Hgb 11.2 today, no active bleeding, continue to monitor -INR 1.4 today   Acute mesenteric ischemia with obstruction of SMA: doing well, tolerating oral intake. Ate all of breakfast, though felt mildly nauseated after Acute kidney injury: resolved, Cr 0.93  Hyperlipidemia: LDL 60, ok to continue current regimen (simvastatin and ezetimibe). Alternative would be to change to high intensity statin, could then likely drop the ezetimibe and simplify her regimen. Appears she has been on the ezetimibe-simvastatin combination since at least 2008  For questions or updates, please contact [place] Please consult [email or web address] for contact info under      Signed, Buford Dresser, MD  04/01/2021, 8:16 AM

## 2021-04-01 NOTE — Progress Notes (Signed)
ANTICOAGULATION CONSULT NOTE   Pharmacy Consult for heparin + warfarin  Indication: atrial fibrillation  No Known Allergies  Patient Measurements: Height: 5' 7"  (170.2 cm) Weight: 78.5 kg (173 lb 1 oz) IBW/kg (Calculated) : 61.6 Heparin Dosing Weight: 68 kg   Vital Signs: Temp: 98.1 F (36.7 C) (08/18 1600) Temp Source: Oral (08/18 1600) BP: 125/75 (08/18 1900) Pulse Rate: 100 (08/18 1900)  Labs: Recent Labs    03/30/21 2039 03/31/21 0436 03/31/21 1615 04/01/21 0410 04/01/21 0905 04/01/21 2006  HGB 13.6 11.9*  --  11.2*  --   --   HCT 41.0 35.2*  --  34.1*  --   --   PLT 261 205  --  222  --   --   APTT  --   --   --   --  51* 85*  LABPROT  --   --   --  17.3*  --   --   INR  --   --   --  1.4*  --   --   HEPARINUNFRC  --  0.26*  --   --  >1.10* 0.79*  CREATININE  --  1.12* 1.07* 0.93  --   --      Estimated Creatinine Clearance: 48.6 mL/min (by C-G formula based on SCr of 0.93 mg/dL).   Medical History: Past Medical History:  Diagnosis Date   COVID 12/18/2020   mild   HTN (hypertension)    Normal at home   Hyperlipidemia    Hypothyroidism    Ileostomy in place [facility])    Memory loss    mild but not dx by doctor per [facility]   Meniere disease 1996   Osteoporosis    UC (ulcerative colitis) ([place])     Medications:  Scheduled:   Chlorhexidine Gluconate Cloth  6 each Topical Daily   ezetimibe  10 mg Oral Daily   And   simvastatin  20 mg Oral q1800   levothyroxine  75 mcg Oral Q0600   metoprolol tartrate  12.5 mg Oral Q6H   pantoprazole  40 mg Oral BID   sodium chloride flush  3 mL Intravenous Q12H   Warfarin - Pharmacist Dosing Inpatient   Does not apply q1600    Assessment: 38 yof 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 Now s/p mechanical thrombectomy on 8/15. No AC PTA.  Was going to change to apixaban (last dose 8/17 at 13:00) however, given no prescription drug coverage and ineligibility for patient assistance given income -  changed  to warfarin.  HL 0.79 is supratherapeutic, aPTT 85 is therapeutic. HL and aPTT not correlating due to apixaban dose.   Received warfarin 87m.   Goal of Therapy:  Heparin level 0.3-0.7 units/ml Monitor platelets by anticoagulation protocol: Yes   Plan:  Continue heparin drip to 1000 units/hr. F/u aPTT until correlates with heparin level  Monitor daily INR, aPTT, HL, CBC, and for s/sx of bleeding    LBenetta Spar PharmD, BCPS, BCitizens Baptist Medical CenterClinical Pharmacist  Please check AMION for all [place] numbers After 10:00 PM, call [place]

## 2021-04-02 DIAGNOSIS — Z7901 Long term (current) use of anticoagulants: Secondary | ICD-10-CM

## 2021-04-02 DIAGNOSIS — K55069 Acute infarction of intestine, part and extent unspecified: Secondary | ICD-10-CM | POA: Diagnosis not present

## 2021-04-02 DIAGNOSIS — E7800 Pure hypercholesterolemia, unspecified: Secondary | ICD-10-CM | POA: Diagnosis not present

## 2021-04-02 DIAGNOSIS — I480 Paroxysmal atrial fibrillation: Secondary | ICD-10-CM | POA: Diagnosis not present

## 2021-04-02 LAB — HEPARIN LEVEL (UNFRACTIONATED): Heparin Unfractionated: 0.75 IU/mL — ABNORMAL HIGH (ref 0.30–0.70)

## 2021-04-02 LAB — PROTIME-INR
INR: 1.6 — ABNORMAL HIGH (ref 0.8–1.2)
Prothrombin Time: 19.4 seconds — ABNORMAL HIGH (ref 11.4–15.2)

## 2021-04-02 LAB — APTT: aPTT: 99 seconds — ABNORMAL HIGH (ref 24–36)

## 2021-04-02 MED ORDER — WARFARIN SODIUM 5 MG PO TABS
5.0000 mg | ORAL_TABLET | Freq: Once | ORAL | Status: AC
  Administered 2021-04-02: 5 mg via ORAL
  Filled 2021-04-02: qty 1

## 2021-04-02 MED ORDER — METOPROLOL TARTRATE 25 MG PO TABS
25.0000 mg | ORAL_TABLET | Freq: Four times a day (QID) | ORAL | Status: DC
  Administered 2021-04-02 – 2021-04-03 (×5): 25 mg via ORAL
  Filled 2021-04-02 (×5): qty 1

## 2021-04-02 NOTE — Progress Notes (Signed)
ANTICOAGULATION CONSULT NOTE   Pharmacy Consult for heparin + warfarin  Indication: atrial fibrillation  No Known Allergies  Patient Measurements: Height: 5' 7"  (170.2 cm) Weight: 78.5 kg (173 lb 1 oz) IBW/kg (Calculated) : 61.6 Heparin Dosing Weight: 68 kg   Vital Signs: Temp: 97.7 F (36.5 C) (08/19 1100) Temp Source: Oral (08/19 1100) BP: 128/77 (08/19 1200) Pulse Rate: 117 (08/19 1200)  Labs: Recent Labs    03/30/21 2039 03/31/21 0436 03/31/21 1615 04/01/21 0410 04/01/21 0905 04/01/21 2006 04/02/21 0513  HGB 13.6 11.9*  --  11.2*  --   --   --   HCT 41.0 35.2*  --  34.1*  --   --   --   PLT 261 205  --  222  --   --   --   APTT  --   --   --   --  51* 85* 99*  LABPROT  --   --   --  17.3*  --   --  19.4*  INR  --   --   --  1.4*  --   --  1.6*  HEPARINUNFRC  --  0.26*  --   --  >1.10* 0.79* 0.75*  CREATININE  --  1.12* 1.07* 0.93  --   --   --      Estimated Creatinine Clearance: 48.6 mL/min (by C-G formula based on SCr of 0.93 mg/dL).   Medical History: Past Medical History:  Diagnosis Date   COVID 12/18/2020   mild   HTN (hypertension)    Normal at home   Hyperlipidemia    Hypothyroidism    Ileostomy in place [facility])    Memory loss    mild but not dx by doctor per [place]   Meniere disease 1996   Osteoporosis    UC (ulcerative colitis) ([place])     Medications:  Scheduled:   Chlorhexidine Gluconate Cloth  6 each Topical Daily   ezetimibe  10 mg Oral Daily   And   simvastatin  20 mg Oral q1800   levothyroxine  75 mcg Oral Q0600   metoprolol tartrate  25 mg Oral Q6H   pantoprazole  40 mg Oral BID   sodium chloride flush  3 mL Intravenous Q12H   warfarin  5 mg Oral ONCE-1600   Warfarin - Pharmacist Dosing Inpatient   Does not apply q1600    Assessment: 78 yof 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 Now s/p mechanical thrombectomy on 8/15. No AC PTA.  Was going to change to apixaban (last dose 8/17 at 13:00) however, given no  prescription drug coverage and ineligibility for patient assistance given income -  changed to warfarin.  HL 0.75 is supratherapeutic continues to be affected by apixaban, aPTT 99sec is therapeutic. At the top of the goal. CBC stable  INR increased after 2 doses of warfarin 1.6 CBC ok      Goal of Therapy:  Heparin level 0.3-0.7 units/ml Monitor platelets by anticoagulation protocol: Yes   Plan:  Decrease  heparin drip to 900 units/hr. Warfarin 36m x1  F/u aPTT until correlates with heparin level  Monitor daily INR, aPTT, HL, CBC, and for s/sx of bleeding    LBonnita NasutiPharm.D. CPP, BCPS Clinical Pharmacist [phone]/19/2022 2:48 PM    Please check AMION for all M[place]phone numbers After 10:00 PM, call [phone]

## 2021-04-02 NOTE — Progress Notes (Signed)
Progress Note  Patient Name: Jasmine Mccormick Date of Encounter: 04/02/2021  [facility] Cardiologist: Buford Dresser, MD   Subjective   Feeling better this AM, no significant nausea. Continues to be unable to feel her atrial fibrillation. No chest pain or shortness of breath.  Inpatient Medications    Scheduled Meds:  Chlorhexidine Gluconate Cloth  6 each Topical Daily   ezetimibe  10 mg Oral Daily   And   simvastatin  20 mg Oral q1800   levothyroxine  75 mcg Oral Q0600   metoprolol tartrate  25 mg Oral Q6H   pantoprazole  40 mg Oral BID   sodium chloride flush  3 mL Intravenous Q12H   Warfarin - Pharmacist Dosing Inpatient   Does not apply q1600   Continuous Infusions:  sodium chloride     heparin 1,000 Units/hr (04/02/21 0800)   promethazine (PHENERGAN) injection (IM or IVPB) 200 mL/hr at 03/31/21 0900   PRN Meds: sodium chloride, acetaminophen, hydrALAZINE, hydrALAZINE, labetalol, ondansetron (ZOFRAN) IV, promethazine (PHENERGAN) injection (IM or IVPB), sodium chloride flush   Vital Signs    Vitals:   04/02/21 0500 04/02/21 0600 04/02/21 0700 04/02/21 0800  BP: (!) 117/92   (!) 149/94  Pulse: (!) 122 (!) 110 (!) 121 (!) 119  Resp: 19 (!) 22 17 14   Temp:      TempSrc:      SpO2: 97% 97% 96% 97%  Weight:      Height:        Intake/Output Summary (Last 24 hours) at 04/02/2021 0917 Last data filed at 04/02/2021 0800 Gross per 24 hour  Intake 1042.45 ml  Output 4035 ml  Net -2992.55 ml   Last 3 Weights 04/01/2021 03/28/2021 03/28/2021  Weight (lbs) 173 lb 1 oz 150 lb 160 lb  Weight (kg) 78.5 kg 68.04 kg 72.576 kg      Telemetry    Atrial fibrillation, rates 100s-130s, improving with rates trending towards 100- Personally Reviewed  ECG    No new since 03/29/21 - Personally Reviewed  Physical Exam   GEN: Well nourished, well developed in no acute distress NECK: No JVD CARDIAC: tachycardic, irregularly irregular rhythm, normal S1 and S2, no  rubs or gallops. No murmur. VASCULAR: Radial pulses 2+ bilaterally.  RESPIRATORY:  Clear to auscultation without rales, wheezing or rhonchi  ABDOMEN: Soft, non-tender, non-distended MUSCULOSKELETAL:  Moves all 4 limbs independently SKIN: Warm and dry, trivial bilateral LE edema NEUROLOGIC:  No focal neuro deficits noted. PSYCHIATRIC:  Normal affect    Labs    High Sensitivity Troponin:   Recent Labs  Lab 03/28/21 2300 03/29/21 0156  TROPONINIHS 9 9      Chemistry Recent Labs  Lab 03/30/21 1620 03/31/21 0436 03/31/21 1615 04/01/21 0410  NA 132* 133* 132* 132*  K 4.3 3.8 4.2 4.1  CL 105 105 104 99  CO2 18* 22 23 24   GLUCOSE 145* 106* 121* 99  BUN 27* 25* 20 16  CREATININE 1.35* 1.12* 1.07* 0.93  CALCIUM 8.0* 7.8* 8.2* 8.5*  PROT 5.3* 4.6* 5.1*  --   ALBUMIN 2.5* 2.2* 2.3*  --   AST 31 24 26   --   ALT 16 14 15   --   ALKPHOS 41 36* 40  --   BILITOT 1.0 0.8 0.9  --   GFRNONAA 39* 48* 51* >60  ANIONGAP 9 6 5 9      Hematology Recent Labs  Lab 03/30/21 2039 03/31/21 0436 04/01/21 0410  WBC 12.3* 8.9 9.3  RBC 4.24 3.62* 3.55*  HGB 13.6 11.9* 11.2*  HCT 41.0 35.2* 34.1*  MCV 96.7 97.2 96.1  MCH 32.1 32.9 31.5  MCHC 33.2 33.8 32.8  RDW 12.9 13.0 12.8  PLT 261 205 222    BNPNo results for input(s): BNP, PROBNP in the last 168 hours.   DDimer No results for input(s): DDIMER in the last 168 hours.   Radiology    No results found.  Cardiac Studies   Echocardiogram 03/30/21:   1. Patient in rapid afib during study.   2. Left ventricular ejection fraction, by estimation, is 60 to 65%. The  left ventricle has normal function. The left ventricle has no regional  wall motion abnormalities. Left ventricular diastolic parameters are  indeterminate.   3. Right ventricular systolic function is normal. The right ventricular  size is normal. There is normal pulmonary artery systolic pressure.   4. Left atrial size was moderately dilated.   5. Right atrial size  was moderately dilated.   6. The mitral valve is degenerative. Trivial mitral valve regurgitation.  No evidence of mitral stenosis. Moderate mitral annular calcification.   7. The aortic valve is tricuspid. Aortic valve regurgitation is not  visualized. Mild to moderate aortic valve sclerosis/calcification is  present, without any evidence of aortic stenosis.   8. The inferior vena cava is normal in size with greater than 50%  respiratory variability, suggesting right atrial pressure of 3 mmHg.   Patient Profile     87 y.o. female with complex history, including ulcerative colitis s/p colectomy with ileostomy placement >20 years ago, HTN, and hypothyroidism, notable for recent distal SMA occlusion s/p thrombectomy. Cardiology asked to see for assistance with atrial fibrillation with RVR.  Assessment & Plan    Paroxysmal atrial fibrillation, unknown chronicity, new diagnosis -concern that SMA occlusion may have been embolic. Now on anticoagulation. Cannot afford DOAC unfortunately, so transitioning to coumadin. -chadsvasc at least 4 -increasing metoprolol again to 25 mg every 6 hours from 12.5 every six, as this was overall well tolerated 8/18 -have discussed alternative is amiodarone, will aim for rate control first if possible -she is asymptomatic with her afib RVR -no aspirin now that she is on anticoagulation -aim for K >4, Mg >2 -no active bleeding, continue to monitor -INR 1.6 today   Acute mesenteric ischemia with obstruction of SMA: doing well, tolerating oral intake. Acute kidney injury: resolved, Cr 0.93 8/18  Hyperlipidemia: LDL 60, ok to continue current regimen (simvastatin and ezetimibe). Alternative would be to change to high intensity statin, could then likely drop the ezetimibe and simplify her regimen. Appears she has been on the ezetimibe-simvastatin combination since at least 2008  For questions or updates, please contact [place] Please consult [email or web address]  for contact info under      Signed, Buford Dresser, MD  04/02/2021, 9:17 AM

## 2021-04-02 NOTE — Progress Notes (Addendum)
    Subjective  - POD #4, status post SMA mechanical thrombectomy  No longer having nausea and vomiting Tolerated clear liquid diet   Physical Exam:  Abdomen is soft and nontender.  She has air in her ostomy bag. Extremities are warm and well-perfused. Nonlabored breathing       Assessment/Plan:  POD #4  A. fib: I will transition her off of heparin to a DOAC.  Appreciate cardiology assistance and management of rate control. GI: We will advance to regular diet ABS have been d/c'd Transfer to 4 E. Possible discharge tomorrow  Annamarie Major 04/02/2021 7:58 AM --  Vitals:   04/02/21 0500 04/02/21 0600  BP: (!) 117/92   Pulse: (!) 122 (!) 110  Resp: 19 (!) 22  Temp:    SpO2: 97% 97%    Intake/Output Summary (Last 24 hours) at 04/02/2021 0758 Last data filed at 04/02/2021 0630 Gross per 24 hour  Intake 1402.69 ml  Output 3760 ml  Net -2357.31 ml     Laboratory CBC    Component Value Date/Time   WBC 9.3 04/01/2021 0410   HGB 11.2 (L) 04/01/2021 0410   HCT 34.1 (L) 04/01/2021 0410   PLT 222 04/01/2021 0410    BMET    Component Value Date/Time   NA 132 (L) 04/01/2021 0410   K 4.1 04/01/2021 0410   CL 99 04/01/2021 0410   CO2 24 04/01/2021 0410   GLUCOSE 99 04/01/2021 0410   BUN 16 04/01/2021 0410   CREATININE 0.93 04/01/2021 0410   CALCIUM 8.5 (L) 04/01/2021 0410   GFRNONAA >60 04/01/2021 0410   GFRAA 106 09/17/2008 0747    COAG Lab Results  Component Value Date   INR 1.6 (H) 04/02/2021   INR 1.4 (H) 04/01/2021   INR 1.0 03/28/2021   No results found for: PTT  Antibiotics Anti-infectives (From admission, onward)    Start     Dose/Rate Route Frequency Ordered Stop   03/29/21 0445  piperacillin-tazobactam (ZOSYN) IVPB 3.375 g        3.375 g 12.5 mL/hr over 240 Minutes Intravenous Every 8 hours 03/29/21 0442 04/02/21 0026        V. Leia Alf, M.D., [facility] [place] Office: [phone] Pager:   [phone]

## 2021-04-02 NOTE — Progress Notes (Signed)
   04/02/21 1539  Assess: MEWS Score  Temp 97.7 F (36.5 C)  BP (!) 150/89  Pulse Rate (!) 116  ECG Heart Rate (!) 116  Resp 18  Level of Consciousness Alert  SpO2 97 %  O2 Device Room Air  Assess: MEWS Score  MEWS Temp 0  MEWS Systolic 0  MEWS Pulse 2  MEWS RR 0  MEWS LOC 0  MEWS Score 2  MEWS Score Color Yellow  Assess: if the MEWS score is Yellow or Red  Were vital signs taken at a resting state? Yes  Focused Assessment No change from prior assessment  Early Detection of Sepsis Score *See Row Information* Low  MEWS guidelines implemented *See Row Information* Yes  Treat  MEWS Interventions Administered scheduled meds/treatments  Pain Scale 0-10  Pain Score 0  Take Vital Signs  Increase Vital Sign Frequency  Yellow: Q 2hr X 2 then Q 4hr X 2, if remains yellow, continue Q 4hrs  Escalate  MEWS: Escalate Yellow: discuss with charge nurse/RN and consider discussing with provider and RRT  Notify: Charge Nurse/RN  Name of Charge Nurse/RN Notified Tori  Date Charge Nurse/RN Notified 04/02/21  Time Charge Nurse/RN Notified 1547  Patient arrived to 4E17 from [place]. MEWS protocol implemented. CHG bath completed. Heparin gtt at 9 mL/hr. Patient placed on telemetry and CCMD notified. Patient oriented to room and call bell left within reach. Bed low and locked with bed alarm engaged. Patient due to void, foley catheter removed 0815, but denies need at this time. Bladder scan completed at 189 mL. Continue current plan of care.

## 2021-04-02 NOTE — TOC Initial Note (Addendum)
Transition of Care [facility]) - Initial/Assessment Note    Patient Details  Name: Jasmine Mccormick MRN: [id number] Date of Birth: 1933/10/30  Transition of Care [facility] CM/SW Contact:    Vinie Sill, LCSW Phone Number: 04/02/2021, 4:37 PM  Clinical Narrative:                  CSW received call from patient's niece, Neoma Laming. She advised the patient lives home alone. She states she lives near the patient but was hoping the patient could possibly go to short term rehab at [facility] for a few days before returning home. CSW explained recommendations are for [facility] and she is ambulating 300 feet with min guard. All questions answered.   She states patient has used [place] in the past but was agreeable to [facility].   TOC will continue to follow and assist with discharge plan.  Thurmond Butts, MSW, LCSW Clinical Social Worker    Expected Discharge Plan: [place]     Patient Goals and CMS Choice        Expected Discharge Plan and Services Expected Discharge Plan: [place]                                              Prior Living Arrangements/Services     Patient language and need for interpreter reviewed:: No        Need for Family Participation in Patient Care: Yes (Comment) Care giver support system in place?: Yes (comment)   Criminal Activity/Legal Involvement Pertinent to Current Situation/Hospitalization: No - Comment as needed  Activities of Daily Living Home Assistive Devices/Equipment: Dentures (specify type), Grab bars in shower, Grab bars around toilet, [place] ADL Screening (condition at time of admission) Patient's cognitive ability adequate to safely complete daily activities?: Yes Is the patient deaf or have difficulty hearing?: No Does the patient have difficulty seeing, even when wearing glasses/contacts?: No Does the patient have difficulty concentrating, remembering, or making  decisions?: Yes Patient able to express need for assistance with ADLs?: Yes Does the patient have difficulty dressing or bathing?: Yes Independently performs ADLs?: Yes (appropriate for developmental age) Does the patient have difficulty walking or climbing stairs?: Yes Weakness of Legs: Both Weakness of Arms/Hands: Both  Permission Sought/Granted Permission sought to share information with : Family Supports    Share Information with NAME: Olegario Messier     Permission granted to share info w Relationship: niece  Permission granted to share info w Contact Information: [phone]  Emotional Assessment       Orientation: : Oriented to Self, Oriented to Place, Oriented to  Time, Oriented to Situation Alcohol / Substance Use: Not Applicable    Admission diagnosis:  GI bleeding [K92.2] Acute GI bleeding [K92.2] Abdominal pain [R10.9] 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[place]) [K55.069] Patient Active Problem List   Diagnosis Date Noted   GI bleeding 03/29/2021   Hypertensive urgency 03/29/2021   Acute GI bleeding 03/29/2021   Abdominal pain 03/29/2021   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[place]) 03/29/2021   Grief 03/17/2021   Gait disorder 03/17/2021   Fracture of third lumbar vertebra ([place]) 02/16/2021   UTI (urinary tract infection) 02/01/2021   COVID-19 12/22/2020   Body mass index (BMI) 26.0-26.9, adult 11/24/2020   Elevated blood-pressure reading, without diagnosis of hypertension 11/24/2020   Acute bilateral low back  pain with left-sided sciatica 11/12/2020   Thigh shingles 04/02/2020   Acute right hip pain 04/02/2020   Dysuria 08/18/2017   Hearing loss of both ears due to cerumen impaction 09/03/2015   Callus of foot 09/03/2015   Low vitamin B12 level 09/03/2015   Right-sided face pain 08/21/2014   Well adult exam 07/04/2012   Hyperglycemia 07/04/2012   Hand dermatitis 06/22/2011   Hypothyroidism 12/07/2010   Actinic keratosis 05/04/2010   NEOPLASM, SKIN, UNCERTAIN BEHAVIOR  73/31/2508   CERUMEN IMPACTION 09/26/2007   RASH-NONVESICULAR 06/12/2007   Dyslipidemia 03/19/2007   MENIERE'S DISEASE 03/19/2007   Essential hypertension 03/19/2007   Ulcerative colitis ([place]) 03/19/2007   Osteoporosis 03/19/2007   PCP:  Cassandria Anger, MD Pharmacy[facility]   [id number]: [phone]: [phone]    Social Determinants of Health (SDOH) Interventions    Readmission Risk Interventions No flowsheet data found.

## 2021-04-02 NOTE — Progress Notes (Signed)
Occupational Therapy Treatment Patient Details Name: Jasmine Mccormick MRN: [id number] DOB: Aug 06, 1934 Today's Date: 04/02/2021    History of present illness Jasmine Mccormick is a 87 y.o. female presents to the ER with sudden onset of abdominal pain and increased bloody stools through the ileostomy bag since 8 PM last night; bowel ischemia due to SMA occlusion, s/p revascularization on 8/15; with history of ulcerative colitis status post colectomy with ileostomy done more than 20 years ago with history of hypertension and hypothyroidism   OT comments  This 87 yo female seen today to focus on toileting including colostomy care, grooming, and mobility in her hospital room with and without RW. Pt was able to completely do her colostomy care when it came to emptying it. She requires min guard-min A for mobility in room with/without RW respectively. She will continue to benefit from acute OT with follow up HHOT and 24 hour S/A (at least for several days).    Follow Up Recommendations  Home health OT;Supervision/Assistance - 24 hour    Equipment Recommendations  3 in 1 bedside commode       Precautions / Restrictions Precautions Precautions: Fall Precaution Comments: watch HR (tachy afib) Restrictions Weight Bearing Restrictions: No       Mobility Bed Mobility Overal bed mobility: Needs Assistance Bed Mobility: Supine to Sit     Supine to sit: Supervision          Transfers Overall transfer level: Needs assistance Equipment used: None Transfers: Sit to/from Stand Sit to Stand: Min assist         General transfer comment: ambulation min A without RW and pt wanting to furniture walk; with RW pt ambulates with min guard A    Balance Overall balance assessment: Needs assistance Sitting-balance support: No upper extremity supported;Feet supported Sitting balance-Leahy Scale: Good     Standing balance support: Single extremity supported;During functional  activity Standing balance-Leahy Scale: Poor                             ADL either performed or assessed with clinical judgement   ADL Overall ADL's : Needs assistance/impaired     Grooming: Wash/dry hands;Min guard;Standing                   Toilet Transfer: Minimal assistance;Ambulation;Regular Toilet;Grab bars (no AD)   Toileting- Clothing Manipulation and Hygiene: Min guard;Sit to/from stand Toileting - Clothing Manipulation Details (indicate cue type and reason): Independent for colostomy care        Spoke with Niece in room extensively about a rollator that she was looking at getting patient as well as the Aging Gracefully program with [employer] and [facility] her with brochure.     Vision Patient Visual Report: No change from baseline            Cognition Arousal/Alertness: Awake/alert Behavior During Therapy: WFL for tasks assessed/performed Overall Cognitive Status: Within Functional Limits for tasks assessed                                                     Pertinent Vitals/ Pain       Pain Assessment: No/denies pain         Frequency  Min 2X/week  Progress Toward Goals  OT Goals(current goals can now be found in the care plan section)  Progress towards OT goals: Progressing toward goals  Acute Rehab OT Goals Patient Stated Goal: hopes to get home tomorrow OT Goal Formulation: With patient Time For Goal Achievement: 04/14/21 Potential to Achieve Goals: Good  Plan Discharge plan remains appropriate       AM-PAC OT "6 Clicks" Daily Activity     Outcome Measure   Help from another person eating meals?: None Help from another person taking care of personal grooming?: A Little Help from another person toileting, which includes using toliet, bedpan, or urinal?: A Little Help from another person bathing (including washing, rinsing, drying)?: A Little Help from  another person to put on and taking off regular upper body clothing?: A Little Help from another person to put on and taking off regular lower body clothing?: A Little 6 Click Score: 19    End of Session Equipment Utilized During Treatment: Gait belt  OT Visit Diagnosis: Other abnormalities of gait and mobility (R26.89);Muscle weakness (generalized) (M62.81)   Activity Tolerance Patient tolerated treatment well   Patient Left in chair;with call bell/phone within reach;with chair alarm set;with family/visitor present (niece and nephew)   Nurse Communication  (can alarms for telemetry be turned off in room--making pt anxious)        Time: 3846-6599 OT Time Calculation (min): 51 min  Charges: OT General Charges $OT Visit: 1 Visit OT Treatments $Self Care/Home Management : 38-52 mins  [place] [address], OTR/L Acute [employer] Pager [phone] Office [phone]    Ishi, [place] 04/02/2021, 6:37 PM

## 2021-04-02 NOTE — Progress Notes (Addendum)
Physical Therapy Treatment Patient Details Name: Jasmine Mccormick MRN: [id number] DOB: 06-02-1934 Today's Date: 04/02/2021    History of Present Illness Jasmine Mccormick is a 87 y.o. 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 Demonstrated increased ambulation distance without seated rest compared to previous session. Spoke to patient and niece about d/c recommendations, and both stated family can provide 24hr support. Updated equipment recommendations, as pt/family states there's a RW at home.    Follow Up Recommendations  Home health PT;Supervision/Assistance - 24 hour     Equipment Recommendations  3in1 (PT);Other (comment) (Pt and niece stated pt has RW at home)    Recommendations for Other Services       Precautions / Restrictions Precautions Precautions: Fall Precaution Comments: watch HR Restrictions Weight Bearing Restrictions: No    Mobility  Bed Mobility               General bed mobility comments: In recliner upon arrival    Transfers Overall transfer level: Needs assistance Equipment used: Rolling walker (2 wheeled) Transfers: Sit to/from Stand Sit to Stand: Min assist         General transfer comment: Required min A for sit to stand to power up to standing.Pt demonstrated good recall of hand placement for transfer.  Ambulation/Gait Ambulation/Gait assistance: Min guard Gait Distance (Feet): 300 Feet Assistive device: Rolling walker (2 wheeled) Gait Pattern/deviations: Step-through pattern;Decreased stride length Gait velocity: Decreased Gait velocity  interpretation: 1.31 - 2.62 ft/sec, indicative of limited community ambulator General Gait Details: Ambulated 300 ft with a brief standing rest break. Pt very motivated to go further and demonstrated great recall of cues given in previous session   Stairs             Wheelchair Mobility    Modified Rankin (Stroke Patients Only)       Balance Overall balance assessment: Needs assistance     Sitting balance - Comments: pt in recliner upon entry   Standing balance support: Bilateral upper extremity supported;During functional activity Standing balance-Leahy Scale: Poor Standing balance comment: Requires BUE support                            Cognition Arousal/Alertness: Awake/alert Behavior During Therapy: WFL for tasks assessed/performed Overall Cognitive Status: Within Functional Limits for tasks assessed                                        Exercises General Exercises - Lower Extremity Hip Flexion/Marching: AROM;Seated;Both    General Comments General comments (skin integrity, edema, etc.): HR 129-140, spO2 93-97% on RA      Pertinent Vitals/Pain Pain Assessment: No/denies pain    Home Living                      Prior Function            PT Goals (current goals can now be found in the care plan section) Acute Rehab  PT Goals Patient Stated Goal: hopes to get home PT Goal Formulation: With patient Time For Goal Achievement: 04/13/21 Potential to Achieve Goals: Good Progress towards PT goals: Progressing toward goals    Frequency    Min 3X/week      PT Plan Current plan remains appropriate    Co-evaluation              AM-PAC PT "6 Clicks" Mobility   Outcome Measure  Help needed turning from your back to your side while in a flat bed without using bedrails?: A Little Help needed moving from lying on your back to sitting on the side of a flat bed without using bedrails?: A Little Help needed moving  to and from a bed to a chair (including a wheelchair)?: A Little Help needed standing up from a chair using your arms (e.g., wheelchair or bedside chair)?: A Little Help needed to walk in hospital room?: A Little Help needed climbing 3-5 steps with a railing? : A Lot 6 Click Score: 17    End of Session Equipment Utilized During Treatment: Gait belt Activity Tolerance: Patient tolerated treatment well Patient left: in chair;with call bell/phone within reach;with family/visitor present Nurse Communication: Mobility status PT Visit Diagnosis: Unsteadiness on feet (R26.81);Other abnormalities of gait and mobility (R26.89);Muscle weakness (generalized) (M62.81)     Time: 4854-6270 PT Time Calculation (min) (ACUTE ONLY): 37 min  Charges:  $Gait Training: 8-22 mins $Therapeutic Activity: 8-22 mins                     Louie Casa, SPT Acute Rehab: [phone]      Domingo Dimes 04/02/2021, 4:14 PM

## 2021-04-03 DIAGNOSIS — I4819 Other persistent atrial fibrillation: Secondary | ICD-10-CM | POA: Diagnosis not present

## 2021-04-03 LAB — HEPARIN LEVEL (UNFRACTIONATED): Heparin Unfractionated: 0.55 IU/mL (ref 0.30–0.70)

## 2021-04-03 LAB — PROTIME-INR
INR: 2 — ABNORMAL HIGH (ref 0.8–1.2)
Prothrombin Time: 22.9 seconds — ABNORMAL HIGH (ref 11.4–15.2)

## 2021-04-03 LAB — APTT: aPTT: 108 seconds — ABNORMAL HIGH (ref 24–36)

## 2021-04-03 MED ORDER — METOPROLOL SUCCINATE ER 50 MG PO TB24
50.0000 mg | ORAL_TABLET | Freq: Every day | ORAL | Status: DC
  Administered 2021-04-03 – 2021-04-05 (×3): 50 mg via ORAL
  Filled 2021-04-03 (×3): qty 1

## 2021-04-03 MED ORDER — METOPROLOL TARTRATE 5 MG/5ML IV SOLN
5.0000 mg | Freq: Four times a day (QID) | INTRAVENOUS | Status: DC | PRN
  Administered 2021-04-03 – 2021-04-05 (×2): 5 mg via INTRAVENOUS
  Filled 2021-04-03 (×3): qty 5

## 2021-04-03 MED ORDER — WARFARIN SODIUM 5 MG PO TABS
5.0000 mg | ORAL_TABLET | Freq: Once | ORAL | Status: AC
  Administered 2021-04-03: 5 mg via ORAL
  Filled 2021-04-03: qty 1

## 2021-04-03 NOTE — Progress Notes (Signed)
Mobility Specialist: Progress Note   04/03/21 1235  Mobility  Activity Ambulated in hall  Level of Assistance Contact guard assist, steadying assist  Assistive Device Front wheel walker  Distance Ambulated (ft) 330 ft  Mobility Ambulated with assistance in hallway  Mobility Response Tolerated well  Mobility performed by Mobility specialist  Bed Position Chair  $Mobility charge 1 Mobility   Pre-Mobility: 127 HR During Mobility: 136 HR Post-Mobility: 120 HR, 150/99 BP, 98% SpO2  Pt to BR and then agreeable to ambulation. Pt c/o L calf tightness during ambulation as well as feeling weak requiring one short standing break. Pt to recliner after walk with call bell and phone at her side. Pt is set-up to eat lunch.   [facility] Jasmine Mccormick Mobility Specialist Mobility Specialist Phone: [phone]

## 2021-04-03 NOTE — Progress Notes (Signed)
   VASCULAR SURGERY ASSESSMENT & PLAN:   S/P MECHANICAL THROMBECTOMY OF SMA: She is on Coumadin.  We will arrange follow-up when she is discharged.  Today however she feels quite weak and I suspect she is not ready for discharge.   SUBJECTIVE:   Complains of feeling very weak.  No abdominal pain.  No nausea.  PHYSICAL EXAM:   Vitals:   04/02/21 2009 04/02/21 2355 04/03/21 0315 04/03/21 0319  BP: (!) 130/93 (!) 166/101  139/90  Pulse: 90 (!) 101  (!) 114  Resp: 20 20 15 16   Temp: 98 F (36.7 C) 97.6 F (36.4 C)  98 F (36.7 C)  TempSrc: Oral Oral  Oral  SpO2: 96% 97%  94%  Weight:      Height:       Her abdomen is soft and nontender.  LABS:   Lab Results  Component Value Date   WBC 9.3 04/01/2021   HGB 11.2 (L) 04/01/2021   HCT 34.1 (L) 04/01/2021   MCV 96.1 04/01/2021   PLT 222 04/01/2021   Lab Results  Component Value Date   CREATININE 0.93 04/01/2021   Lab Results  Component Value Date   INR 2.0 (H) 04/03/2021    PROBLEM LIST:    Principal Problem:   Abdominal pain Active Problems:   Ulcerative colitis (HCC)   GI bleeding   Hypertensive urgency   Acute GI bleeding   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HCC)   CURRENT MEDS:    Chlorhexidine Gluconate Cloth  6 each Topical Daily   ezetimibe  10 mg Oral Daily   And   simvastatin  20 mg Oral q1800   levothyroxine  75 mcg Oral Q0600   metoprolol tartrate  25 mg Oral Q6H   pantoprazole  40 mg Oral BID   sodium chloride flush  3 mL Intravenous Q12H   warfarin  5 mg Oral ONCE-1600   Warfarin - Pharmacist Dosing Inpatient   Does not apply [place] Office: [phone] 04/03/2021

## 2021-04-03 NOTE — Consult Note (Signed)
[place] Nurse ostomy consult note Stoma type/location: RLQ ileostomy Stomal assessment/size: Not measured today (pouch intact) Peristomal assessment: Not seen today Treatment options for stomal/peristomal skin: N/A Output : brown Ostomy pouching: 1pc  Education provided: None Enrolled patient in [place] program: Yes  Patient prefers to use her own ostomy supplies and has asked a niece to bring them for her from her home. I will provide supply numbers for the staff's use should those supplies either not arrive or need to be refreshed, 1-piece convex ostomy pouch #[id number].  [place] nursing team will not follow, but will remain available to this patient, the nursing and medical teams.  Please re-consult if needed. Thanks, Maudie Flakes, MSN, RN, [place], Arther Abbott  Pager# [phone]

## 2021-04-03 NOTE — Progress Notes (Signed)
ANTICOAGULATION CONSULT NOTE - Follow Up Consult  Pharmacy Consult for heparin + warfarin Indication: atrial fibrillation  No Known Allergies  Patient Measurements: Height: 5' 7"  (170.2 cm) Weight: 78.5 kg (173 lb 1 oz) IBW/kg (Calculated) : 61.6 Heparin Dosing Weight: 68 kg  Vital Signs: Temp: 98 F (36.7 C) (08/20 0319) Temp Source: Oral (08/20 0319) BP: 139/90 (08/20 0319) Pulse Rate: 114 (08/20 0319)  Labs: Recent Labs    03/31/21 1615 04/01/21 0410 04/01/21 0905 04/01/21 2006 04/02/21 0513 04/03/21 0225  HGB  --  11.2*  --   --   --   --   HCT  --  34.1*  --   --   --   --   PLT  --  222  --   --   --   --   APTT  --   --    < > 85* 99* 108*  LABPROT  --  17.3*  --   --  19.4* 22.9*  INR  --  1.4*  --   --  1.6* 2.0*  HEPARINUNFRC  --   --    < > 0.79* 0.75* 0.55  CREATININE 1.07* 0.93  --   --   --   --    < > = values in this interval not displayed.    Estimated Creatinine Clearance: 48.6 mL/min (by C-G formula based on SCr of 0.93 mg/dL).   Assessment: 69 yof 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 Now s/p mechanical thrombectomy on 8/15. No AC PTA.  Was going to change to apixaban (last dose 8/17 at 13:00) however, given no prescription drug coverage and ineligibility for patient assistance given income -  changed to warfarin.  Heparin level this morning is therapeutic at 0.55 and aPTT is supratherapeutic at 108. INR is therapeutic at 2. CBC stable. Given therapeutic INR, it is reasonable to discontinue the heparin and continue warfarin at this time.  Goal of Therapy:  INR 2-3 Monitor platelets by anticoagulation protocol: Yes   Plan:  Discontinue heparin  Warfarin 5 mg x1 tonight Monitor daily INR and for s/sx of bleeding  Thank you for including pharmacy in the care of this patient.  Zenaida Deed, PharmD PGY1 Acute Care Pharmacy Resident  Phone: [phone] 04/03/2021  7:43 AM  Please check [email or web address] for unit-specific  pharmacy phone numbers.

## 2021-04-03 NOTE — Progress Notes (Signed)
Patient is concerned colostomy bag will need to be replaced soon. Site clean, dry and intact. Niece stated she will bring supplies from home. Will ask secretary to order supplies.  Daymon Larsen, RN

## 2021-04-03 NOTE — Progress Notes (Signed)
Progress Note  Patient Name: Jasmine Mccormick Date of Encounter: 04/03/2021  Primary Cardiologist: Buford Dresser, MD  Subjective   No palpitations or chest pain.  Inpatient Medications    Scheduled Meds:  Chlorhexidine Gluconate Cloth  6 each Topical Daily   ezetimibe  10 mg Oral Daily   And   simvastatin  20 mg Oral q1800   levothyroxine  75 mcg Oral Q0600   metoprolol tartrate  25 mg Oral Q6H   pantoprazole  40 mg Oral BID   sodium chloride flush  3 mL Intravenous Q12H   warfarin  5 mg Oral ONCE-1600   Warfarin - Pharmacist Dosing Inpatient   Does not apply q1600   Continuous Infusions:  sodium chloride     promethazine (PHENERGAN) injection (IM or IVPB) 200 mL/hr at 03/31/21 0900   PRN Meds: sodium chloride, acetaminophen, hydrALAZINE, hydrALAZINE, labetalol, ondansetron (ZOFRAN) IV, promethazine (PHENERGAN) injection (IM or IVPB), sodium chloride flush   Vital Signs    Vitals:   04/02/21 2355 04/03/21 0315 04/03/21 0319 04/03/21 0913  BP: (!) 166/101  139/90 120/77  Pulse: (!) 101  (!) 114 91  Resp: 20 15 16 20   Temp: 97.6 F (36.4 C)  98 F (36.7 C) 97.9 F (36.6 C)  TempSrc: Oral  Oral Oral  SpO2: 97%  94% 99%  Weight:      Height:        Intake/Output Summary (Last 24 hours) at 04/03/2021 1036 Last data filed at 04/03/2021 0448 Gross per 24 hour  Intake 174.41 ml  Output 450 ml  Net -275.59 ml   Filed Weights   03/28/21 2231 03/28/21 2243 04/01/21 0700  Weight: 72.6 kg 68 kg 78.5 kg    Telemetry    Rapid atrial fibrillation.  Personally reviewed.  ECG    No ECG reviewed.  Physical Exam   GEN: Elderly woman.  No acute distress.   Neck: No JVD. Cardiac: Irregularly irregular, no gallop.  Respiratory: Nonlabored. Clear to auscultation bilaterally. GI: Soft, nontender, bowel sounds present. MS: No edema; kyphosis.  Labs    Chemistry Recent Labs  Lab 03/30/21 1620 03/31/21 0436 03/31/21 1615 04/01/21 0410  NA 132*  133* 132* 132*  K 4.3 3.8 4.2 4.1  CL 105 105 104 99  CO2 18* 22 23 24   GLUCOSE 145* 106* 121* 99  BUN 27* 25* 20 16  CREATININE 1.35* 1.12* 1.07* 0.93  CALCIUM 8.0* 7.8* 8.2* 8.5*  PROT 5.3* 4.6* 5.1*  --   ALBUMIN 2.5* 2.2* 2.3*  --   AST 31 24 26   --   ALT 16 14 15   --   ALKPHOS 41 36* 40  --   BILITOT 1.0 0.8 0.9  --   GFRNONAA 39* 48* 51* >60  ANIONGAP 9 6 5 9      Hematology Recent Labs  Lab 03/30/21 2039 03/31/21 0436 04/01/21 0410  WBC 12.3* 8.9 9.3  RBC 4.24 3.62* 3.55*  HGB 13.6 11.9* 11.2*  HCT 41.0 35.2* 34.1*  MCV 96.7 97.2 96.1  MCH 32.1 32.9 31.5  MCHC 33.2 33.8 32.8  RDW 12.9 13.0 12.8  PLT 261 205 222    Cardiac Enzymes Recent Labs  Lab 03/28/21 2300 03/29/21 0156  TROPONINIHS 9 9    Radiology    No results found.  Cardiac Studies   Echocardiogram 03/30/2021:  1. Patient in rapid afib during study.   2. Left ventricular ejection fraction, by estimation, is 60 to 65%. The  left  ventricle has normal function. The left ventricle has no regional  wall motion abnormalities. Left ventricular diastolic parameters are  indeterminate.   3. Right ventricular systolic function is normal. The right ventricular  size is normal. There is normal pulmonary artery systolic pressure.   4. Left atrial size was moderately dilated.   5. Right atrial size was moderately dilated.   6. The mitral valve is degenerative. Trivial mitral valve regurgitation.  No evidence of mitral stenosis. Moderate mitral annular calcification.   7. The aortic valve is tricuspid. Aortic valve regurgitation is not  visualized. Mild to moderate aortic valve sclerosis/calcification is  present, without any evidence of aortic stenosis.   8. The inferior vena cava is normal in size with greater than 50%  respiratory variability, suggesting right atrial pressure of 3 mmHg.   Assessment & Plan    1.  Persistent atrial fibrillation with RVR, duration uncertain.  CHA2DS2-VASc score is  5.  She is asymptomatic in terms of palpitations, but heart rate control is not yet optimal.  She has tolerated up titration of Lopressor to 25 mg p.o. every 6 hours is otherwise on Coumadin for stroke prophylaxis.  2.  Mesenteric ischemia due to obstruction of SMA, possibly embolic in light of problem #1.  3.  Mixed hyperlipidemia with LDL 68 on Zocor and Zetia.  Attempt simplification of beta-blocker dosing, initiate Toprol-XL 50 mg this morning.  Expect she may even need further up titration or twice daily dosing but will use IV Lopressor coverage later today if needed and uptitrate long-acting beta-blocker tomorrow as needed.  Continue Coumadin, INR is 2.0.  Signed, Rozann Lesches, MD  04/03/2021, 10:36 AM

## 2021-04-04 DIAGNOSIS — R1032 Left lower quadrant pain: Secondary | ICD-10-CM | POA: Diagnosis not present

## 2021-04-04 DIAGNOSIS — I4819 Other persistent atrial fibrillation: Secondary | ICD-10-CM | POA: Diagnosis not present

## 2021-04-04 LAB — PROTIME-INR
INR: 2.3 — ABNORMAL HIGH (ref 0.8–1.2)
Prothrombin Time: 25.5 seconds — ABNORMAL HIGH (ref 11.4–15.2)

## 2021-04-04 MED ORDER — WARFARIN SODIUM 5 MG PO TABS: 5.0000 mg | ORAL_TABLET | Freq: Once | ORAL | Status: DC

## 2021-04-04 MED ORDER — WARFARIN SODIUM 5 MG PO TABS
5.0000 mg | ORAL_TABLET | Freq: Once | ORAL | Status: AC
  Administered 2021-04-04: 5 mg via ORAL
  Filled 2021-04-04: qty 1

## 2021-04-04 MED ORDER — METOPROLOL SUCCINATE ER 25 MG PO TB24
25.0000 mg | ORAL_TABLET | Freq: Once | ORAL | Status: AC
  Administered 2021-04-04: 25 mg via ORAL
  Filled 2021-04-04: qty 1

## 2021-04-04 NOTE — Progress Notes (Signed)
Occupational Therapy Treatment Patient Details Name: Jasmine Mccormick MRN: [id number] DOB: [birth year] Today's Date: 04/04/2021    History of present illness Jasmine Mccormick is a 87 y.o. 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 Pt currently requires minA for toilet transfers, minguard for ambulation at RW level with cues for safe use of walker. Pt demonstrates decreased activity tolerance and elevated HR up to 130bpm with exertion. Pt scored 18/28 on the short blessed test, she demonstrates  limitations with memory, executive functioning skills, working memory, and short term memory indiciating pt may have difficulty with managing medications, cooking, and other tasks requiring higher executive functioning skills. Updated d/c recommendation to SNF at this time as she will require 24/7 physical assistance and supervision at discharge. Will continue to follow acutely.      Follow Up Recommendations  SNF;Supervision/Assistance - 24 hour    Equipment Recommendations  3 in 1 bedside commode    Recommendations for Other Services      Precautions / Restrictions Precautions Precautions: Fall Precaution Comments: watch HR (tachy afib) Restrictions Weight Bearing Restrictions: No       Mobility Bed Mobility Overal bed mobility: Needs Assistance Bed Mobility: Supine to Sit     Supine to sit: Supervision     General bed mobility comments: in recliner on arrival    Transfers Overall transfer level: Needs assistance Equipment  used: None;Rolling walker (2 wheeled) Transfers: Sit to/from Stand Sit to Stand: Min assist         General transfer comment: minA to powerup into standing from toilet; from recliner minguard to RW    Balance Overall balance assessment: Needs assistance Sitting-balance support: No upper extremity supported;Feet supported Sitting balance-Leahy Scale: Good Sitting balance - Comments: pt in recliner upon entry   Standing balance support: Single extremity supported;During functional activity Standing balance-Leahy Scale: Poor Standing balance comment: reliant on at least single UE support for dynamic balance                           ADL either performed or assessed with clinical judgement   ADL Overall ADL's : Needs assistance/impaired     Grooming: Wash/dry hands;Min guard;Standing           Upper Body Dressing : Set up;Sitting   Lower Body Dressing: Maximal assistance;Sit to/from stand Lower Body Dressing Details (indicate cue type and reason): requires assist for socks, min assist sit to stand Toilet Transfer: Minimal assistance;Ambulation;Regular Toilet;Grab bars;RW   Toileting- Clothing Manipulation and Hygiene: Minimal assistance;Sit to/from stand Toileting - Clothing Manipulation Details (indicate cue type and reason): from standard commode, independent with colostomy care     Functional mobility during ADLs: Minimal assistance;Rolling walker;Cueing for safety General ADL Comments: pt limited by weakness, decreased activity tolerance, impaired balance     Vision   Vision Assessment?: No apparent visual deficits   Perception     Praxis      Cognition Arousal/Alertness: Awake/alert Behavior During Therapy: WFL for tasks assessed/performed Overall Cognitive Status: No family/caregiver present to determine baseline  cognitive functioning Area of Impairment: Memory                     Memory: Decreased short-term memory         General  Comments: initially forgot she was allowed to empty her own colostomy pouch and was going to have RN do it for her        Exercises     Shoulder Instructions       General Comments HR 110bpm at rest, up to 130bpm with mobility    Pertinent Vitals/ Pain       Pain Assessment: No/denies pain Faces Pain Scale: No hurt  Home Living                                          Prior Functioning/Environment              Frequency  Min 2X/week        Progress Toward Goals  OT Goals(current goals can now be found in the care plan section)  Progress towards OT goals: Progressing toward goals  Acute Rehab OT Goals Patient Stated Goal: pt agreeable to SNF OT Goal Formulation: With patient Time For Goal Achievement: 04/14/21 Potential to Achieve Goals: Good ADL Goals Pt Will Perform Grooming: with supervision;standing Pt Will Perform Lower Body Dressing: with supervision;with adaptive equipment;sit to/from stand Pt Will Transfer to Toilet: with supervision;ambulating Pt Will Perform Toileting - Clothing Manipulation and hygiene: with supervision;sit to/from stand Pt Will Perform Tub/Shower Transfer: Shower transfer;3 in 1;ambulating;with supervision;rolling walker;grab bars Pt/caregiver will Perform Home Exercise Program: Increased strength;Both right and left upper extremity;With written HEP provided;With theraband  Plan Discharge plan needs to be updated    Co-evaluation                 AM-PAC OT "6 Clicks" Daily Activity     Outcome Measure   Help from another person eating meals?: None Help from another person taking care of personal grooming?: A Little Help from another person toileting, which includes using toliet, bedpan, or urinal?: A Little Help from another person bathing (including washing, rinsing, drying)?: A Little Help from another person to put on and taking off regular upper body clothing?: A Little Help from another person to  put on and taking off regular lower body clothing?: A Little 6 Click Score: 19    End of Session Equipment Utilized During Treatment: Gait belt;Rolling walker  OT Visit Diagnosis: Other abnormalities of gait and mobility (R26.89);Muscle weakness (generalized) (M62.81)   Activity Tolerance Patient tolerated treatment well   Patient Left in chair;with call bell/phone within reach;with chair alarm set;with family/visitor present (niece and nephew)   Nurse Communication Mobility status        Time: 7680-8811 OT Time Calculation (min): 24 min  Charges: OT General Charges $OT Visit: 1 Visit OT Treatments $Self Care/Home Management : 23-37 mins  Helene Kelp OTR/L Acute Rehabilitation Services Office: [place] 04/04/2021, 12:17 PM

## 2021-04-04 NOTE — Progress Notes (Signed)
Physical Therapy Treatment Patient Details Name: Jasmine Mccormick MRN: [id number] DOB: 12/19/1933 Today's Date: 04/04/2021    History of Present Illness Jasmine Mccormick is a 87 y.o. 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 Patient continues to need min assist to rise from toilet, min assist to maneuver RW over threshold and into small bathroom, and minguard assist with cues for safe use of RW when approaching the sink to wash her hands.  She was only able to walk 45 ft with RW (normally does not use a device at home) before required a standing rest break due to general fatigue and elevated HR to 130 bpm. She repeated questions re: call button and her phone multiple times during session (unclear if cognition is at her baseline as she does have a history of decr cognition). Continue to recommend 24/7 assist upon discharge which family cannot provide with updated recommendation to SNF.    Follow Up Recommendations  Supervision/Assistance - 24 hour;SNF     Equipment Recommendations  3in1 (PT);Other (comment) (Pt and niece stated pt has RW at home)    Recommendations for Other Services       Precautions / Restrictions Precautions Precautions: Fall Precaution Comments: watch HR (tachy afib) Restrictions Weight Bearing Restrictions: No    Mobility  Bed Mobility Overal bed mobility: Needs Assistance Bed Mobility: Supine to Sit     Supine to sit: Supervision     General bed mobility comments: in recliner on  arrival    Transfers Overall transfer level: Needs assistance Equipment used: None;Rolling walker (2 wheeled) Transfers: Sit to/from Stand Sit to Stand: Min assist         General transfer comment: minA to powerup into standing from toilet; from recliner minguard to RW  Ambulation/Gait Ambulation/Gait assistance: Min assist Gait Distance (Feet): 45 Feet (standing rest, 45 seated rest; 15 ft) Assistive device: Rolling walker (2 wheeled) Gait Pattern/deviations: Step-through pattern;Decreased stride length;Trunk flexed Gait velocity: Decreased   General Gait Details: reporting legs feeling tired today and required standing rest period due to HR elevated and general fatigue; returned to seated rest/toilet and required min assist to maneuver RW over threshold and into small bathroom (pt trying to step around the back leg of RW instead of staying inside the RW); approaching sink pt tried to "park" the RW off to the side and required cues to keep RW in front of her as she approached the sink)   Chief Strategy Officer    Modified Rankin (Stroke Patients Only)       Balance Overall balance assessment: Needs assistance Sitting-balance support: No upper extremity supported;Feet supported Sitting balance-Leahy Scale: Good Sitting balance - Comments: pt in recliner upon entry   Standing balance support: Single extremity supported;During functional activity Standing balance-Leahy Scale: Poor Standing balance comment: reliant on at least single UE support for dynamic balance  Cognition Arousal/Alertness: Awake/alert Behavior During Therapy: WFL for tasks assessed/performed Overall Cognitive Status: No family/caregiver present to determine baseline cognitive functioning Area of Impairment: Memory                     Memory: Decreased short-term memory         General Comments: initially forgot she was allowed  to empty her own colostomy pouch and was going to have RN do it for her      Exercises      General Comments General comments (skin integrity, edema, etc.): HR 110bpm at rest, up to 130bpm with mobility      Pertinent Vitals/Pain Pain Assessment: No/denies pain Faces Pain Scale: No hurt    Home Living                      Prior Function            PT Goals (current goals can now be found in the care plan section) Acute Rehab PT Goals Patient Stated Goal: pt agreeable to SNF Time For Goal Achievement: 04/13/21 Potential to Achieve Goals: Good Progress towards PT goals: Not progressing toward goals - comment (more fatigued today)    Frequency    Min 3X/week      PT Plan Discharge plan needs to be updated;Frequency needs to be updated    Co-evaluation              AM-PAC PT "6 Clicks" Mobility   Outcome Measure  Help needed turning from your back to your side while in a flat bed without using bedrails?: A Little Help needed moving from lying on your back to sitting on the side of a flat bed without using bedrails?: A Little Help needed moving to and from a bed to a chair (including a wheelchair)?: A Little Help needed standing up from a chair using your arms (e.g., wheelchair or bedside chair)?: A Little Help needed to walk in hospital room?: A Little Help needed climbing 3-5 steps with a railing? : A Lot 6 Click Score: 17    End of Session Equipment Utilized During Treatment: Gait belt Activity Tolerance: Patient limited by fatigue Patient left: in chair;with call bell/phone within reach;with chair alarm set Nurse Communication: Mobility status PT Visit Diagnosis: Unsteadiness on feet (R26.81);Other abnormalities of gait and mobility (R26.89);Muscle weakness (generalized) (M62.81)     Time: 6333-5456 PT Time Calculation (min) (ACUTE ONLY): 23 min  Charges:  $Gait Training: 8-22 mins                      Arby Barrette, PT Pager [phone]    Rexanne Mano 04/04/2021, 11:43 AM

## 2021-04-04 NOTE — Progress Notes (Signed)
ANTICOAGULATION CONSULT NOTE - Follow Up Consult  Pharmacy Consult for heparin + warfarin Indication: atrial fibrillation  No Known Allergies  Patient Measurements: Height: 5' 7"  (170.2 cm) Weight: 78.5 kg (173 lb 1 oz) IBW/kg (Calculated) : 61.6 Heparin Dosing Weight: 68 kg  Vital Signs: Temp: 98 F (36.7 C) (08/21 0908) Temp Source: Oral (08/21 0908) BP: 128/78 (08/21 0908) Pulse Rate: 105 (08/21 0909)  Labs: Recent Labs    04/01/21 2006 04/02/21 0513 04/03/21 0225 04/04/21 0440  APTT 85* 99* 108*  --   LABPROT  --  19.4* 22.9* 25.5*  INR  --  1.6* 2.0* 2.3*  HEPARINUNFRC 0.79* 0.75* 0.55  --      Estimated Creatinine Clearance: 48.6 mL/min (by C-G formula based on SCr of 0.93 mg/dL).   Assessment: 18 yof 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 Now s/p mechanical thrombectomy on 8/15. No AC PTA.  Was going to change to apixaban (last dose 8/17 at 13:00) however, given no prescription drug coverage and ineligibility for patient assistance given income -  changed to warfarin.  INR continues to be therapeutic at 2.3.  Goal of Therapy:  INR 2-3 Monitor platelets by anticoagulation protocol: Yes   Plan:  Warfarin 5 mg x1 tonight Monitor daily INR and for s/sx of bleeding  Thank you for including pharmacy in the care of this patient.  Zenaida Deed, PharmD PGY1 Acute Care Pharmacy Resident  Phone: [phone] 04/04/2021  3:52 PM  Please check [email or web address] for unit-specific pharmacy phone numbers.

## 2021-04-04 NOTE — Progress Notes (Signed)
Patients niece Neoma Laming is concerned about patient coming home versus going to a SNF. She would like to speak with case manager about this. Case manager notified.  Daymon Larsen, RN

## 2021-04-04 NOTE — Progress Notes (Signed)
Progress Note  Patient Name: Jasmine Mccormick Date of Encounter: 04/04/2021  Primary Cardiologist: Buford Dresser, MD  Subjective   No sense of palpitations or chest pain.  Wants to go home.  Inpatient Medications    Scheduled Meds:  Chlorhexidine Gluconate Cloth  6 each Topical Daily   ezetimibe  10 mg Oral Daily   And   simvastatin  20 mg Oral q1800   levothyroxine  75 mcg Oral Q0600   metoprolol succinate  50 mg Oral Daily   pantoprazole  40 mg Oral BID   sodium chloride flush  3 mL Intravenous Q12H   Warfarin - Pharmacist Dosing Inpatient   Does not apply q1600   Continuous Infusions:  sodium chloride     promethazine (PHENERGAN) injection (IM or IVPB) 200 mL/hr at 03/31/21 0900   PRN Meds: sodium chloride, acetaminophen, hydrALAZINE, hydrALAZINE, labetalol, metoprolol tartrate, ondansetron (ZOFRAN) IV, promethazine (PHENERGAN) injection (IM or IVPB), sodium chloride flush   Vital Signs    Vitals:   04/04/21 0010 04/04/21 0440 04/04/21 0908 04/04/21 0909  BP: (!) 137/96 136/87 128/78   Pulse: (!) 107 (!) 107 (!) 105 (!) 105  Resp: 18 18 (!) 21 20  Temp: 97.7 F (36.5 C) 97.6 F (36.4 C) 98 F (36.7 C)   TempSrc: Oral Oral Oral   SpO2: 91% 97% 96% 96%  Weight:      Height:        Intake/Output Summary (Last 24 hours) at 04/04/2021 0941 Last data filed at 04/04/2021 0904 Gross per 24 hour  Intake 480 ml  Output 125 ml  Net 355 ml   Filed Weights   03/28/21 2231 03/28/21 2243 04/01/21 0700  Weight: 72.6 kg 68 kg 78.5 kg    Telemetry    Atrial fibrillation with heart rate 110.  Personally reviewed.  ECG    No ECG reviewed.  Physical Exam   GEN: Elderly woman.  No acute distress.   Neck: No JVD. Cardiac: Irregularly irregular, no gallop.  Respiratory: Nonlabored.  Clear to auscultation bilaterally. GI: Soft, nontender, bowel sounds present. MS: No edema; kyphosis.  Labs    Chemistry Recent Labs  Lab 03/30/21 1620  03/31/21 0436 03/31/21 1615 04/01/21 0410  NA 132* 133* 132* 132*  K 4.3 3.8 4.2 4.1  CL 105 105 104 99  CO2 18* 22 23 24   GLUCOSE 145* 106* 121* 99  BUN 27* 25* 20 16  CREATININE 1.35* 1.12* 1.07* 0.93  CALCIUM 8.0* 7.8* 8.2* 8.5*  PROT 5.3* 4.6* 5.1*  --   ALBUMIN 2.5* 2.2* 2.3*  --   AST 31 24 26   --   ALT 16 14 15   --   ALKPHOS 41 36* 40  --   BILITOT 1.0 0.8 0.9  --   GFRNONAA 39* 48* 51* >60  ANIONGAP 9 6 5 9      Hematology Recent Labs  Lab 03/30/21 2039 03/31/21 0436 04/01/21 0410  WBC 12.3* 8.9 9.3  RBC 4.24 3.62* 3.55*  HGB 13.6 11.9* 11.2*  HCT 41.0 35.2* 34.1*  MCV 96.7 97.2 96.1  MCH 32.1 32.9 31.5  MCHC 33.2 33.8 32.8  RDW 12.9 13.0 12.8  PLT 261 205 222    Cardiac Enzymes Recent Labs  Lab 03/28/21 2300 03/29/21 0156  TROPONINIHS 9 9    Radiology    No results found.  Cardiac Studies   Echocardiogram 03/30/2021:  1. Patient in rapid afib during study.   2. Left ventricular ejection fraction, by  estimation, is 60 to 65%. The  left ventricle has normal function. The left ventricle has no regional  wall motion abnormalities. Left ventricular diastolic parameters are  indeterminate.   3. Right ventricular systolic function is normal. The right ventricular  size is normal. There is normal pulmonary artery systolic pressure.   4. Left atrial size was moderately dilated.   5. Right atrial size was moderately dilated.   6. The mitral valve is degenerative. Trivial mitral valve regurgitation.  No evidence of mitral stenosis. Moderate mitral annular calcification.   7. The aortic valve is tricuspid. Aortic valve regurgitation is not  visualized. Mild to moderate aortic valve sclerosis/calcification is  present, without any evidence of aortic stenosis.   8. The inferior vena cava is normal in size with greater than 50%  respiratory variability, suggesting right atrial pressure of 3 mmHg.   Assessment & Plan    1.  Persistent atrial  fibrillation with RVR, duration uncertain.  CHA2DS2-VASc score is 5.  She is asymptomatic in terms of palpitations.  Tolerated switch to Toprol-XL and otherwise on Coumadin for stroke prophylaxis.  2.  Mesenteric ischemia due to obstruction of SMA, possibly embolic in light of problem #1.  3.  Mixed hyperlipidemia with LDL 68 on Zocor and Zetia.  Increase Toprol-XL to 75 mg daily, dose will be supplemented this morning.  Next step Toprol-XL 50 mg twice daily if needed.  Continue Coumadin, INR is 2.3.  Signed, Rozann Lesches, MD  04/04/2021, 9:41 AM

## 2021-04-04 NOTE — NC FL2 (Signed)
[place] LEVEL OF CARE SCREENING TOOL     IDENTIFICATION  Patient Name: Jasmine Mccormick Birthdate: 10-08-1933 Sex: female Admission Date (Current Location): 03/28/2021  [facility] and [place] Number:  Herbalist and Address:  [facility] [place]. [facility], [place] [address], [place],  [address]      Provider Number: [id number]  Attending Physician Name and Address:  Serafina Mitchell, MD  Relative Name and Phone Number:  Olegario Messier Niece [phone]  [phone]    Current Level of Care: Hospital Recommended Level of Care: [place] Prior Approval Number:    Date Approved/Denied:   PASRR Number: [id number]  Discharge Plan: SNF    Current Diagnoses: Patient Active Problem List   Diagnosis Date Noted   GI bleeding 03/29/2021   Hypertensive urgency 03/29/2021   Acute GI bleeding 03/29/2021   Abdominal pain 03/29/2021   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[place]) 03/29/2021   Grief 03/17/2021   Gait disorder 03/17/2021   Fracture of third lumbar vertebra ([place]) 02/16/2021   UTI (urinary tract infection) 02/01/2021   COVID-19 12/22/2020   Body mass index (BMI) 26.0-26.9, adult 11/24/2020   Elevated blood-pressure reading, without diagnosis of hypertension 11/24/2020   Acute bilateral low back pain with left-sided sciatica 11/12/2020   Thigh shingles 04/02/2020   Acute right hip pain 04/02/2020   Dysuria 08/18/2017   Hearing loss of both ears due to cerumen impaction 09/03/2015   Callus of foot 09/03/2015   Low vitamin B12 level 09/03/2015   Right-sided face pain 08/21/2014   Well adult exam 07/04/2012   Hyperglycemia 07/04/2012   Hand dermatitis 06/22/2011   Hypothyroidism 12/07/2010   Actinic keratosis 05/04/2010   NEOPLASM, SKIN, UNCERTAIN BEHAVIOR 41/63/8453   CERUMEN IMPACTION 09/26/2007   RASH-NONVESICULAR 06/12/2007   Dyslipidemia 03/19/2007   MENIERE'S DISEASE 03/19/2007   Essential hypertension 03/19/2007    Ulcerative colitis ([place]) 03/19/2007   Osteoporosis 03/19/2007    Orientation RESPIRATION BLADDER Height & Weight     Self, Time, Situation, Place  Normal Continent Weight: 173 lb 1 oz (78.5 kg) Height:  5' 7"  (170.2 cm)  BEHAVIORAL SYMPTOMS/MOOD NEUROLOGICAL BOWEL NUTRITION STATUS      Ileostomy Diet (see discharge summary)  AMBULATORY STATUS COMMUNICATION OF NEEDS Skin   Limited Assist Verbally Surgical wounds                       Personal Care Assistance Level of Assistance  Bathing, Feeding, Dressing Bathing Assistance: Maximum assistance Feeding assistance: Limited assistance Dressing Assistance: Maximum assistance     Functional Limitations Info  Sight, Hearing, Speech Sight Info: Adequate Hearing Info: Adequate Speech Info: Adequate    SPECIAL CARE FACTORS FREQUENCY  PT (By licensed PT), OT (By licensed OT)     PT Frequency: 5x week OT Frequency: 5x week            Contractures Contractures Info: Not present    Additional Factors Info  Code Status, Allergies Code Status Info: full Allergies Info: NKA           Current Medications (04/04/2021):  This is the current hospital active medication list Current Facility-Administered Medications  Medication Dose Route Frequency Provider Last Rate Last Admin   0.9 %  sodium chloride infusion  250 mL Intravenous PRN Dagoberto Ligas, PA-C       acetaminophen (TYLENOL) tablet 650 mg  650 mg Oral Q4H PRN Dagoberto Ligas, PA-C       Chlorhexidine Gluconate Cloth  2 % PADS 6 each  6 each Topical Daily Dagoberto Ligas, PA-C   6 each at 04/04/21 6629   ezetimibe (ZETIA) tablet 10 mg  10 mg Oral Daily Dagoberto Ligas, PA-C   10 mg at 04/04/21 4765   And   simvastatin (ZOCOR) tablet 20 mg  20 mg Oral q1800 Dagoberto Ligas, PA-C   20 mg at 04/03/21 1747   hydrALAZINE (APRESOLINE) injection 10-20 mg  10-20 mg Intravenous Q4H PRN Dagoberto Ligas, PA-C       hydrALAZINE (APRESOLINE) injection 5 mg  5 mg Intravenous  Q20 Min PRN Dagoberto Ligas, PA-C       labetalol (NORMODYNE) injection 10 mg  10 mg Intravenous Q10 min PRN Dagoberto Ligas, PA-C   10 mg at 03/30/21 2215   levothyroxine (SYNTHROID) tablet 75 mcg  75 mcg Oral Q0600 Dagoberto Ligas, PA-C   75 mcg at 04/04/21 4650   metoprolol succinate (TOPROL-XL) 24 hr tablet 50 mg  50 mg Oral Daily Satira Sark, MD   50 mg at 04/04/21 0910   metoprolol tartrate (LOPRESSOR) injection 5 mg  5 mg Intravenous Q6H PRN Satira Sark, MD   5 mg at 04/03/21 1752   ondansetron (ZOFRAN) injection 4 mg  4 mg Intravenous Q6H PRN Dagoberto Ligas, PA-C       pantoprazole (PROTONIX) EC tablet 40 mg  40 mg Oral BID Dagoberto Ligas, PA-C   40 mg at 04/04/21 0910   promethazine (PHENERGAN) 12.5 mg in sodium chloride 0.9 % 50 mL IVPB  12.5 mg Intravenous Q6H PRN Dagoberto Ligas, PA-C 200 mL/hr at 03/31/21 0900 Infusion Verify at 03/31/21 0900   sodium chloride flush (NS) 0.9 % injection 3 mL  3 mL Intravenous Q12H Dagoberto Ligas, PA-C   3 mL at 04/04/21 3546   sodium chloride flush (NS) 0.9 % injection 3 mL  3 mL Intravenous PRN Dagoberto Ligas, PA-C       Warfarin - Pharmacist Dosing Inpatient   Does not apply q1600 Iline Oven   Given at 04/01/21 1503     Discharge Medications: Please see discharge summary for a list of discharge medications.  Relevant Imaging Results:  Relevant Lab Results:   Additional Information SSN: [id number]. Pt is vaccinated for covid with one booster.  Joanne Chars, LCSW

## 2021-04-04 NOTE — TOC Progression Note (Signed)
Transition of Care [facility]) - Progression Note    Patient Details  Name: Jasmine Mccormick MRN: [id number] Date of Birth: May 18, 1934  Transition of Care [facility]) CM/SW [place], [place] Phone Number: [phone] 04/04/2021, 3:18 PM  Clinical Narrative:     CSW spoke with pt's nieceNeoma Laming) in regards to pt's Dc plan being changed to a SNF. Neoma Laming was aware of the recommendation and was in agreement. Neoma Laming informed CSW that she would like pt to go to [facility]. CSW received permission to also send out to local facilities. Pt has had both vaccines and one booster. CSW provided Neoma Laming with medicare.gov rating list website.  CSW changed the disposition plan to SNF.  Expected Discharge Plan: [place]    Expected Discharge Plan and Services Expected Discharge Plan: [place]                                               Social Determinants of Health (SDOH) Interventions    Readmission Risk Interventions No flowsheet data found.

## 2021-04-04 NOTE — Progress Notes (Signed)
Contacted pt's niece Neoma Laming) at [phone] per her request. She reports that she is concern if her aunt goes home because she can't provide 24 hrs. She reports that her mother is also sick and she is taking care of her. She would like for her aunt to go to [employer]. Discussed PT recommendations and previous notes from [place], [place]. She reports that she wasn't here when PT evaluated her. She reports that her aunt is not ready to be D/C and she will appeal if she is D/C today. Will discuss aunt's concern with RN and MD. Will continue to f/u to assist with the D/C plan.

## 2021-04-04 NOTE — Progress Notes (Signed)
Mobility Specialist: Progress Note   04/04/21 1815  Mobility  Activity Ambulated in hall  Level of Assistance Standby assist, set-up cues, supervision of patient - no hands on  Assistive Device Front wheel walker  Distance Ambulated (ft) 330 ft  Mobility Ambulated with assistance in hallway  Mobility Response Tolerated well  Mobility performed by Mobility specialist  Bed Position Chair  $Mobility charge 1 Mobility   Pre-Mobility: 107 HR Post-Mobility: 127 HR, 103/62 BP, 98% SpO2  Pt standby assist to stand from recliner as well as during ambulation. Pt c/o LLE calf tightness during ambulation, otherwise asx. Pt back to recliner after walk with call bell and phone at her side.   [facility] Stony Stegmann Mobility Specialist Mobility Specialist Phone: [phone]

## 2021-04-04 NOTE — Progress Notes (Signed)
PT/OT recommending SNF. Notified Lattie Haw, SW of D/C plan to SNF.

## 2021-04-04 NOTE — Progress Notes (Signed)
   VASCULAR SURGERY ASSESSMENT & PLAN:   S/P MECHANICAL THROMBECTOMY OF SMA: She is on Coumadin.  She feels much better today than yesterday and wants to go home.  Okay for discharge from our standpoint.  I have arranged follow-up in our office.   SUBJECTIVE:   No specific complaints this morning.  PHYSICAL EXAM:   Vitals:   04/04/21 0010 04/04/21 0440 04/04/21 0908 04/04/21 0909  BP: (!) 137/96 136/87 128/78   Pulse: (!) 107 (!) 107 (!) 105 (!) 105  Resp: 18 18 (!) 21 20  Temp: 97.7 F (36.5 C) 97.6 F (36.4 C) 98 F (36.7 C)   TempSrc: Oral Oral Oral   SpO2: 91% 97% 96% 96%  Weight:      Height:       Abdomen soft and nontender.  LABS:   Lab Results  Component Value Date   WBC 9.3 04/01/2021   HGB 11.2 (L) 04/01/2021   HCT 34.1 (L) 04/01/2021   MCV 96.1 04/01/2021   PLT 222 04/01/2021   Lab Results  Component Value Date   CREATININE 0.93 04/01/2021   Lab Results  Component Value Date   INR 2.3 (H) 04/04/2021   CBG (last 3)  No results for input(s): GLUCAP in the last 72 hours.  PROBLEM LIST:    Principal Problem:   Abdominal pain Active Problems:   Ulcerative colitis (HCC)   GI bleeding   Hypertensive urgency   Acute GI bleeding   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H[facility])   CURRENT MEDS:    Chlorhexidine Gluconate Cloth  6 each Topical Daily   ezetimibe  10 mg Oral Daily   And   simvastatin  20 mg Oral q1800   levothyroxine  75 mcg Oral Q0600   metoprolol succinate  25 mg Oral Once   metoprolol succinate  50 mg Oral Daily   pantoprazole  40 mg Oral BID   sodium chloride flush  3 mL Intravenous Q12H   Warfarin - Pharmacist Dosing Inpatient   Does not apply [place] Office: [phone] 04/04/2021

## 2021-04-05 ENCOUNTER — Telehealth: Payer: Self-pay | Admitting: Cardiology

## 2021-04-05 DIAGNOSIS — I4819 Other persistent atrial fibrillation: Secondary | ICD-10-CM

## 2021-04-05 DIAGNOSIS — I160 Hypertensive urgency: Secondary | ICD-10-CM | POA: Diagnosis not present

## 2021-04-05 LAB — SARS CORONAVIRUS 2 (TAT 6-24 HRS): SARS Coronavirus 2: NEGATIVE

## 2021-04-05 LAB — PROTIME-INR
INR: 3.1 — ABNORMAL HIGH (ref 0.8–1.2)
Prothrombin Time: 31.7 seconds — ABNORMAL HIGH (ref 11.4–15.2)

## 2021-04-05 MED ORDER — SIMVASTATIN 20 MG PO TABS: 20.0000 mg | ORAL_TABLET | Freq: Every evening | ORAL | 0 refills | Status: DC

## 2021-04-05 MED ORDER — ALUM & MAG HYDROXIDE-SIMETH 200-200-20 MG/5 ML NICU TOPICAL: 1.0000 "application " | TOPICAL | 0 refills | Status: DC | PRN

## 2021-04-05 MED ORDER — METOPROLOL SUCCINATE ER 50 MG PO TB24
50.0000 mg | ORAL_TABLET | Freq: Two times a day (BID) | ORAL | Status: DC
  Administered 2021-04-05 – 2021-04-06 (×2): 50 mg via ORAL
  Filled 2021-04-05 (×2): qty 1

## 2021-04-05 MED ORDER — METOPROLOL SUCCINATE ER 50 MG PO TB24: 50.0000 mg | ORAL_TABLET | Freq: Every day | ORAL | 0 refills | Status: DC

## 2021-04-05 MED ORDER — WARFARIN SODIUM 3 MG PO TABS: 3.0000 mg | ORAL_TABLET | Freq: Every day | ORAL | 1 refills | Status: DC

## 2021-04-05 MED ORDER — EZETIMIBE 10 MG PO TABS: 10.0000 mg | ORAL_TABLET | Freq: Every day | ORAL | 0 refills | Status: DC

## 2021-04-05 MED ORDER — METOPROLOL SUCCINATE ER 50 MG PO TB24: 50.0000 mg | ORAL_TABLET | Freq: Two times a day (BID) | ORAL | 3 refills | Status: DC

## 2021-04-05 NOTE — Telephone Encounter (Signed)
Patient needs TOC call   Date: 04/15/2021    Time: 11:00 AM    Visit Type: OFFICE VISIT [1004]    Provider: Buford Dresser, MD Department: [facility] [place]

## 2021-04-05 NOTE — Progress Notes (Signed)
CVC R neck removed. Patient tolerated well.  Patient educated on bedrest. Niece present.  Daymon Larsen, RN

## 2021-04-05 NOTE — TOC Transition Note (Signed)
Transition of Care [facility]) - CM/SW Discharge Note   Patient Details  Name: RAEGAN WINDERS MRN: [id number] Date of Birth: 10/25/1933  [facility] CM/SW Contact:  Vinie Sill, LCSW Phone Number: 04/05/2021, 4:55 PM   Clinical Narrative:     Patient will Discharge to: [place] Discharge Date: 04/05/21 Family Notified: niece,Deborah Transport By: Corey Harold  Per MD patient is ready for discharge. RN, patient, and facility notified of discharge. Discharge Summary sent to facility. RN given number for [phone], Room 206. Ambulance transport requested for patient.   Clinical Social Worker signing off.  Thurmond Butts, MSW, LCSW Clinical Social Worker    Final next level of care: Skilled Nursing Facility Barriers to Discharge: Barriers Resolved   Patient Goals and CMS Choice        Discharge Placement              Patient chooses bed at: [place] Patient to be transferred to facility by: [place] Name of family member notified: niece, Neoma Laming Patient and family notified of of transfer: 04/05/21  Discharge Plan and Services                                     Social Determinants of Health (SDOH) Interventions     Readmission Risk Interventions No flowsheet data found.

## 2021-04-05 NOTE — TOC Progression Note (Signed)
Transition of Care [facility]) - Progression Note    Patient Details  Name: LEINANI LISBON MRN: [id number] Date of Birth: 1933/08/25  Transition of Care [facility]) CM/SW [place], [place] Phone Number: 04/05/2021, 2:41 PM  Clinical Narrative:     D/c on hold until covid test comes back . Covid test remains in process.  CSW will continue to follow.  Thurmond Butts, MSW, LCSW Clinical Social Worker    Expected Discharge Plan: Skilled Nursing Facility Barriers to Discharge: Barriers Resolved  Expected Discharge Plan and Services Expected Discharge Plan: [place]         Expected Discharge Date: 04/05/21                                     Social Determinants of Health (SDOH) Interventions    Readmission Risk Interventions No flowsheet data found.

## 2021-04-05 NOTE — Discharge Summary (Addendum)
[facility] Discharge Summary   Patient ID:  Jasmine Mccormick MRN: [id number] DOB/AGE: 21-Nov-1933 87 y.o.  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 female, who presented to the emergency department on 03/28/2021 at approximately 10 PM with complaints of abdominal pain. CT scan demonstrated: 1. 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 2. Moderate to marked severity diffuse calcification and atherosclerosis throughout the remaining arterial structures within the abdomen and pelvis.  Plan for intervention.  Hospital Course:  Jasmine Mccormick is Jasmine 87 y.o. female is S/P  Procedure(s): ULTRASOUND GUIDANCE FOR VASCULAR ACCESS PERCUTANEOUS THROMBECTOMY WITH PENUMBRA MESENTERIC ARTERY THROMBECTOMY INTRA OPERATIVE AORTOGRAM  Left intubated post op due to acidemia and risk of progression to bowel ischemia.  Thx CCM for managing.  B doppler signals right DP left PT signals.  Afib.  Right LQ ostomy care initiated. Heparin post op then  transitioned to Coumadin and Toprol-XL 50 BID.  INR 3.1 today.  cardiology for assistance with rate control.  Mixed hyperlipidemia with LDL 68 on Zocor and Zetia Speech therapy Diet  Dysphagia 3 (mechanical soft) thin liquids   She is tolerating PO's well without N/V.   Abdomin is soft and she has had + BM into her colostomy. PT/OT recommend SNF family unable to provide 24 hour care.  Consults:  Treatment Team:  Serafina Mitchell, MD  Significant Diagnostic Studies: CBC Lab Results  Component Value Date   WBC 9.3 04/01/2021   HGB 11.2 (L) 04/01/2021   HCT 34.1 (L) 04/01/2021   MCV 96.1 04/01/2021   PLT 222 04/01/2021    BMET    Component Value Date/Time   NA 132 (L) 04/01/2021 0410   K 4.1 04/01/2021 0410   CL 99 04/01/2021 0410   CO2 24 04/01/2021 0410   GLUCOSE 99 04/01/2021 0410   BUN 16 04/01/2021 0410   CREATININE 0.93 04/01/2021 0410   CALCIUM 8.5 (L) 04/01/2021 0410   GFRNONAA >60 04/01/2021 0410   GFRAA 106 09/17/2008 0747   COAG Lab Results  Component Value Date   INR 3.1 (H) 04/05/2021   INR 2.3 (H) 04/04/2021   INR 2.0 (H) 04/03/2021     Disposition:  Discharge to :Skilled nursing facility Discharge Instructions     Call MD for:  redness, tenderness, or signs of infection (pain, swelling, bleeding, redness, odor or green/yellow discharge around incision site)   Complete by: As directed    Call MD for:  severe or increased pain, loss or decreased feeling  in affected limb(s)   Complete by: As directed    Call MD for:  temperature >100.5   Complete by: As directed    Resume previous diet   Complete by: As directed       Allergies as of 04/05/2021   No Known Allergies      Medication List     STOP taking these medications    verapamil 80 MG tablet Commonly known as: CALAN       TAKE these medications    alum & mag hydroxide-simeth 200-200-20 MG/5 Susp Commonly known as: MAALOX/MYLANTA Apply 1 application topically as needed (for  skin irritation around stoma).   ASPIRIN 81 PO Take 1 tablet by mouth daily.   ezetimibe-simvastatin 10-20 MG tablet Commonly known as: VYTORIN Take 1 tablet by mouth daily.   levothyroxine 75 MCG tablet Commonly known as: SYNTHROID Take 1 tablet (75 mcg total) by mouth daily.   metoprolol succinate 50 MG 24 hr tablet Commonly known as: TOPROL-XL Take 1 tablet (50 mg total) by mouth 2 (two) times daily. Take with or immediately following Jasmine meal. What changed:  medication strength how much to take when to take this additional instructions   multivitamin,tx-minerals tablet Take 1 tablet by mouth daily.   warfarin 3 MG tablet Commonly known as: Coumadin Take 1 tablet (3 mg total) by mouth daily.       Verbal and written Discharge instructions given to the patient. Wound care per Discharge AVS  Follow-up Information     Serafina Mitchell, MD Follow up in 4 week(s).   Specialties: Vascular Surgery, Cardiology Why: Office will call you to arrange your appt (sent) Contact information: [place] [address] [facility] Follow up on 04/15/2021.   Specialty: Cardiology Why: Please arrive 15 minutes early for your 11am post-hospital cardiology appointment Contact information: [place] [address] [place] Office Follow up on 04/09/2021.   Specialty: Cardiology Why: Please arrive 15 minutes early for your 10:30am coumadin clinic appointment. You will have your INR checked at this time and receive advice for how to take your coumadin going forward Contact information: [address], [address] [place] [phone]                Signed: Karoline Caldwell 04/05/2021, 2:30 PM

## 2021-04-05 NOTE — Discharge Instructions (Signed)

## 2021-04-05 NOTE — Progress Notes (Addendum)
[facility] [place]  Subjective  - She feels a little better each day.   Objective 138/80 (!) 105 97.6 F (36.4 C) (Oral) 17 98%  Intake/Output Summary (Last 24 hours) at 04/05/2021 0809 Last data filed at 04/04/2021 1401 Gross per 24 hour  Intake 480 ml  Output 125 ml  Net 355 ml    B feet warm and well perfused Abdomin soft with very slight tenderness LLQ Tolerating PO's without N/V + BM via colostomy bad   Assessment/Planning: POD # s/p MECHANICAL THROMBECTOMY OF SMA  Pending SNF.  Care manager talked with Niece Neoma Laming and She would like for her aunt to go to [employer] [facility]. Discussed PT recommendations and previous notes from [place], [place]. Coumadin for anticoagulation INR 3.1 Mobility and comfort level Pending discharge  Roxy Horseman 04/05/2021 8:09 AM --  Laboratory Lab Results: No results for input(s): WBC, HGB, HCT, PLT in the last 72 hours. BMET No results for input(s): NA, K, CL, CO2, GLUCOSE, BUN, CREATININE, CALCIUM in the last 72 hours.  COAG Lab Results  Component Value Date   INR 3.1 (H) 04/05/2021   INR 2.3 (H) 04/04/2021   INR 2.0 (H) 04/03/2021   No results found for: PTT   I agree with the above.  The patient's INR is therapeutic.  When she feels comfortable with discharge plans, she can go home.  Annamarie Major

## 2021-04-05 NOTE — Progress Notes (Signed)
Made Kristi,RN aware of CVC discontinue order. She will be removing this line.  Delilah Shan Koralynn Greenspan,RN-VAST

## 2021-04-05 NOTE — Progress Notes (Signed)
Progress Note  Patient Name: Jasmine Mccormick Date of Encounter: 04/05/2021  [facility] Cardiologist: Buford Dresser, MD   Subjective   No complaints of chest pain, SOB, or palpitations. She remains unaware of her atrial fibrillation.   Inpatient Medications    Scheduled Meds:  Chlorhexidine Gluconate Cloth  6 each Topical Daily   ezetimibe  10 mg Oral Daily   And   simvastatin  20 mg Oral q1800   levothyroxine  75 mcg Oral Q0600   metoprolol succinate  50 mg Oral Daily   pantoprazole  40 mg Oral BID   sodium chloride flush  3 mL Intravenous Q12H   Warfarin - Pharmacist Dosing Inpatient   Does not apply q1600   Continuous Infusions:  sodium chloride     promethazine (PHENERGAN) injection (IM or IVPB) 200 mL/hr at 03/31/21 0900   PRN Meds: sodium chloride, acetaminophen, hydrALAZINE, hydrALAZINE, labetalol, metoprolol tartrate, ondansetron (ZOFRAN) IV, promethazine (PHENERGAN) injection (IM or IVPB), sodium chloride flush   Vital Signs    Vitals:   04/04/21 2325 04/04/21 2329 04/04/21 2331 04/05/21 0758  BP: (!) 147/88   138/80  Pulse: (!) 124  (!) 104 (!) 105  Resp: 20 20 15 17   Temp: (!) 97.5 F (36.4 C)   97.6 F (36.4 C)  TempSrc: Oral   Oral  SpO2: 96%   98%  Weight:      Height:        Intake/Output Summary (Last 24 hours) at 04/05/2021 1137 Last data filed at 04/04/2021 1401 Gross per 24 hour  Intake 240 ml  Output --  Net 240 ml   Last 3 Weights 04/01/2021 03/28/2021 03/28/2021  Weight (lbs) 173 lb 1 oz 150 lb 160 lb  Weight (kg) 78.5 kg 68.04 kg 72.576 kg      Telemetry    Atrial fibrillation with RVR, rates persistently in the 100s-120s, occasionally briefly up to 140s.  - Personally Reviewed  ECG    No new tracings - Personally Reviewed  Physical Exam   GEN: No acute distress.   Neck: No JVD Cardiac: IRIR, no murmurs, rubs, or gallops.  Respiratory: Clear to auscultation bilaterally. GI: Soft, nontender, non-distended,  +ostomy   MS: No edema; No deformity. Neuro:  Nonfocal  Psych: Normal affect   Labs    High Sensitivity Troponin:   Recent Labs  Lab 03/28/21 2300 03/29/21 0156  TROPONINIHS 9 9      Chemistry Recent Labs  Lab 03/30/21 1620 03/31/21 0436 03/31/21 1615 04/01/21 0410  NA 132* 133* 132* 132*  K 4.3 3.8 4.2 4.1  CL 105 105 104 99  CO2 18* 22 23 24   GLUCOSE 145* 106* 121* 99  BUN 27* 25* 20 16  CREATININE 1.35* 1.12* 1.07* 0.93  CALCIUM 8.0* 7.8* 8.2* 8.5*  PROT 5.3* 4.6* 5.1*  --   ALBUMIN 2.5* 2.2* 2.3*  --   AST 31 24 26   --   ALT 16 14 15   --   ALKPHOS 41 36* 40  --   BILITOT 1.0 0.8 0.9  --   GFRNONAA 39* 48* 51* >60  ANIONGAP 9 6 5 9      Hematology Recent Labs  Lab 03/30/21 2039 03/31/21 0436 04/01/21 0410  WBC 12.3* 8.9 9.3  RBC 4.24 3.62* 3.55*  HGB 13.6 11.9* 11.2*  HCT 41.0 35.2* 34.1*  MCV 96.7 97.2 96.1  MCH 32.1 32.9 31.5  MCHC 33.2 33.8 32.8  RDW 12.9 13.0 12.8  PLT 261  205 222    BNPNo results for input(s): BNP, PROBNP in the last 168 hours.   DDimer No results for input(s): DDIMER in the last 168 hours.   Radiology    No results found.  Cardiac Studies   Echocardiogram 03/30/2021:  1. Patient in rapid afib during study.   2. Left ventricular ejection fraction, by estimation, is 60 to 65%. The  left ventricle has normal function. The left ventricle has no regional  wall motion abnormalities. Left ventricular diastolic parameters are  indeterminate.   3. Right ventricular systolic function is normal. The right ventricular  size is normal. There is normal pulmonary artery systolic pressure.   4. Left atrial size was moderately dilated.   5. Right atrial size was moderately dilated.   6. The mitral valve is degenerative. Trivial mitral valve regurgitation.  No evidence of mitral stenosis. Moderate mitral annular calcification.   7. The aortic valve is tricuspid. Aortic valve regurgitation is not  visualized. Mild to moderate aortic  valve sclerosis/calcification is  present, without any evidence of aortic stenosis.   8. The inferior vena cava is normal in size with greater than 50%  respiratory variability, suggesting right atrial pressure of 3 mmHg.   Patient Profile     87 y.o. female with complex history, including ulcerative colitis s/p colectomy with ileostomy placement >20 years ago, HTN, and hypothyroidism, notable for recent distal SMA occlusion s/p thrombectomy. Cardiology asked to see for assistance with atrial fibrillation with RVR.  Assessment & Plan    1. Persistent atrial fibrillation: new diagnosis this admission. Largely asymptomatic though had recent distal SMA occlusion s/p thrombectomy with concerns that Afib may have been etiology. CHA2DS2-VASc Score = 4 [CHF History: No, HTN History: Yes, Diabetes History: No, Stroke History: No, Vascular Disease History: No, Age Score: 2, Gender Score: 1]; consideration for +2 for SMA occlusion event with the patient's annual risk of stroke of at least 4.8 %. She was started on coumadin for stroke ppx as she was unable to afford DOAC. Echo showed EF 60-65%, normal RV size/function, moderate biatrial enlargement, and no significant valvular dysfunction. Rates have been persistently in the 100s-120s despite increasing metoprolol to 57m daily. INR 3.1 today - Will increase metoprolol succinate further to 576mBID given poor rate control - Continue coumadin per pharmacy for stroke ppx - goal 2-3 - Will arrange coumadin clinic appt for close monitoring of INR - Could consider outpatient cardioversion in 3 weeks if she remains therapeutic with her coumadin and in persistent Afib.   2. SMA occlusion: she presented with abdominal pain and was found to have SMA occlusion. Taken emergently to the OR for thromboembolectomy with successful recannulization and mechanical thrombectomy. Possibly 2/2 #1 - Continue anticoagulation as above  3. HLD: LDL 68 on home simvastatin-zetia.  -  Continue simvastatin-zetia   Follow-up has been arranged with Dr. ChHarrell Gave/1/22 at 11am. AVS updated.  For questions or updates, please contact [facility] consult [email or web address] for contact info under        Signed, KrAbigail ButtsPA-C  04/05/2021, 11:37 AM

## 2021-04-05 NOTE — Telephone Encounter (Signed)
Patient currently admitted

## 2021-04-05 NOTE — Care Management Important Message (Signed)
Important Message  Patient Details  Name: Jasmine Mccormick MRN: [id number] Date of Birth: [birth year]   Medicare Important Message Given:  Yes     Shelda Altes 04/05/2021, 11:34 AM

## 2021-04-05 NOTE — Progress Notes (Addendum)
A staff from [facility] called and informed us that Pt is unable to transfer tonight due to Jasmine Mccormick does not receive Pt transferring after 9:00 pm. [facility] clarified that they are unable to make it on time due to traffic issue.  Pt was informed. She is hemodynamically stable and afebrile. No acute distress noted. We will continue to monitor.  Kennyth Lose, RN

## 2021-04-05 NOTE — Progress Notes (Signed)
Report given to [facility] [place]. [phone].

## 2021-04-06 DIAGNOSIS — R1084 Generalized abdominal pain: Secondary | ICD-10-CM | POA: Diagnosis not present

## 2021-04-06 DIAGNOSIS — R4181 Age-related cognitive decline: Secondary | ICD-10-CM | POA: Diagnosis not present

## 2021-04-06 DIAGNOSIS — K922 Gastrointestinal hemorrhage, unspecified: Secondary | ICD-10-CM | POA: Diagnosis not present

## 2021-04-06 DIAGNOSIS — K5190 Ulcerative colitis, unspecified, without complications: Secondary | ICD-10-CM | POA: Diagnosis not present

## 2021-04-06 DIAGNOSIS — I708 Atherosclerosis of other arteries: Secondary | ICD-10-CM | POA: Diagnosis not present

## 2021-04-06 DIAGNOSIS — D6869 Other thrombophilia: Secondary | ICD-10-CM | POA: Diagnosis not present

## 2021-04-06 DIAGNOSIS — K55059 Acute (reversible) ischemia of intestine, part and extent unspecified: Secondary | ICD-10-CM | POA: Diagnosis not present

## 2021-04-06 DIAGNOSIS — R0902 Hypoxemia: Secondary | ICD-10-CM | POA: Diagnosis not present

## 2021-04-06 DIAGNOSIS — Z8616 Personal history of COVID-19: Secondary | ICD-10-CM | POA: Diagnosis not present

## 2021-04-06 DIAGNOSIS — Z7901 Long term (current) use of anticoagulants: Secondary | ICD-10-CM | POA: Diagnosis not present

## 2021-04-06 DIAGNOSIS — Z8781 Personal history of (healed) traumatic fracture: Secondary | ICD-10-CM | POA: Diagnosis not present

## 2021-04-06 DIAGNOSIS — R2681 Unsteadiness on feet: Secondary | ICD-10-CM | POA: Diagnosis not present

## 2021-04-06 DIAGNOSIS — E538 Deficiency of other specified B group vitamins: Secondary | ICD-10-CM | POA: Diagnosis not present

## 2021-04-06 DIAGNOSIS — M6281 Muscle weakness (generalized): Secondary | ICD-10-CM | POA: Diagnosis not present

## 2021-04-06 DIAGNOSIS — I160 Hypertensive urgency: Secondary | ICD-10-CM | POA: Diagnosis not present

## 2021-04-06 DIAGNOSIS — R109 Unspecified abdominal pain: Secondary | ICD-10-CM | POA: Diagnosis not present

## 2021-04-06 DIAGNOSIS — H8109 Meniere's disease, unspecified ear: Secondary | ICD-10-CM | POA: Diagnosis not present

## 2021-04-06 DIAGNOSIS — I4891 Unspecified atrial fibrillation: Secondary | ICD-10-CM | POA: Diagnosis not present

## 2021-04-06 DIAGNOSIS — E039 Hypothyroidism, unspecified: Secondary | ICD-10-CM | POA: Diagnosis not present

## 2021-04-06 DIAGNOSIS — M810 Age-related osteoporosis without current pathological fracture: Secondary | ICD-10-CM | POA: Diagnosis not present

## 2021-04-06 DIAGNOSIS — M545 Low back pain, unspecified: Secondary | ICD-10-CM | POA: Diagnosis not present

## 2021-04-06 DIAGNOSIS — E785 Hyperlipidemia, unspecified: Secondary | ICD-10-CM | POA: Diagnosis not present

## 2021-04-06 DIAGNOSIS — Z7401 Bed confinement status: Secondary | ICD-10-CM | POA: Diagnosis not present

## 2021-04-06 DIAGNOSIS — K55069 Acute infarction of intestine, part and extent unspecified: Secondary | ICD-10-CM | POA: Diagnosis not present

## 2021-04-06 DIAGNOSIS — Z932 Ileostomy status: Secondary | ICD-10-CM | POA: Diagnosis not present

## 2021-04-06 DIAGNOSIS — Z9889 Other specified postprocedural states: Secondary | ICD-10-CM | POA: Diagnosis not present

## 2021-04-06 DIAGNOSIS — I10 Essential (primary) hypertension: Secondary | ICD-10-CM | POA: Diagnosis not present

## 2021-04-06 DIAGNOSIS — U071 COVID-19: Secondary | ICD-10-CM | POA: Diagnosis not present

## 2021-04-06 LAB — PROTIME-INR
INR: 3.4 — ABNORMAL HIGH (ref 0.8–1.2)
Prothrombin Time: 34 seconds — ABNORMAL HIGH (ref 11.4–15.2)

## 2021-04-06 NOTE — Progress Notes (Addendum)
[facility] [place]  Subjective  - Doing well ready to be discharge to SNF   Objective (!) 129/93 (!) 118 97.7 F (36.5 C) (Oral) 13 98%  Intake/Output Summary (Last 24 hours) at 04/06/2021 0830 Last data filed at 04/05/2021 2100 Gross per 24 hour  Intake 610 ml  Output --  Net 610 ml    Abdomin soft NTTP Stool in colostomy Feet warm well perfused Lungs non labored breathing  Assessment/Planning: s/p MECHANICAL THROMBECTOMY OF SMA  Discharge to SNF for rehab was detained due to transportation Coumadin 3 mg daily with INR f/u  Roxy Horseman 04/06/2021 8:30 AM --  Laboratory Lab Results: No results for input(s): WBC, HGB, HCT, PLT in the last 72 hours. BMET No results for input(s): NA, K, CL, CO2, GLUCOSE, BUN, CREATININE, CALCIUM in the last 72 hours.  COAG Lab Results  Component Value Date   INR 3.4 (H) 04/06/2021   INR 3.1 (H) 04/05/2021   INR 2.3 (H) 04/04/2021   No results found for: PTT  Anticipate d/c to SNF today INR theraputic No abdominal pain  Wells Ahmed Inniss

## 2021-04-06 NOTE — TOC Transition Note (Signed)
Transition of Care [facility]) - CM/SW Discharge Note   Patient Details  Name: Jasmine Mccormick MRN: [id number] Date of Birth: Jan 29, 1934  [facility]) CM/SW Contact:  Vinie Sill, LCSW Phone Number: 04/06/2021, 9:43 AM   Clinical Narrative:     Patient will Discharge to: [place] Discharge Date: 04/06/21 Family Notified: niece,Deborah Transport By: Corey Harold   Per MD patient is ready for discharge. RN, patient, and facility notified of discharge. Discharge Summary sent to facility. RN given number for [phone], Room 206. Ambulance transport requested for patient.    Clinical Social Worker signing off.   Thurmond Butts, MSW, LCSW Clinical Social Worker  Final next level of care: Skilled Nursing Facility Barriers to Discharge: Barriers Resolved   Patient Goals and CMS Choice        Discharge Placement              Patient chooses bed at: [facility] Patient to be transferred to facility by: [facility] [place] Name of family member notified: niece, Neoma Laming Patient and family notified of of transfer: 04/05/21  Discharge Plan and Services                                     Social Determinants of Health (SDOH) Interventions     Readmission Risk Interventions No flowsheet data found.

## 2021-04-06 NOTE — Progress Notes (Signed)
Patient IV and tele were removed packet of paper working sent with EMS to [facility]. Will discharge as ordered.Nelda Bucks, Bettina Gavia rN

## 2021-04-06 NOTE — Progress Notes (Signed)
  Speech Language Pathology Treatment: Dysphagia  Patient Details Name: Jasmine Mccormick MRN: [id number] DOB: 1933-09-17 Today's Date: 04/06/2021 Time: 6384-6659 SLP Time Calculation (min) (ACUTE ONLY): 14 min  Assessment / Plan / Recommendation Clinical Impression  Pt subjectively reports improvements in swallowing, feeling like she is back to her baseline. She seems to like her current mechanical soft diet, reporting that she hasn't gotten anything "too hard" and that she has not been getting any of the indigestion she was experiencing PTA (per RN, pt also getting Protonix). Pt took a few small pills at once and did report a globus sensation after with occasional delayed throat clearing. SLP reviewed several esophageal strategies with her, and she acknowledged her understanding but also said that this is not a common occurrence for her. SLP also suggested taking pills one at a time. Given that she appears to be at her baseline and she is pending d/c, SLP to sign off at this time.    HPI HPI: Jasmine Mccormick is a 87 y.o. female with history of ulcerative colitis status post colectomy with ileostomy done more than 20 years ago with history of hypertension and hypothyroidism presents to the ER with sudden onset of abdominal pain and increased bloody stools through the ileostomy bag since 8 PM last night.  Patient's last food was around 4 PM.  Patient's pain is mostly around the epigastric area going to the back and chest.  Pt s/p mechanical thrombectomy, of the superior mesenteric artery, using penumbra device. ETT 8/15-8/16.      SLP Plan  All goals met       Recommendations  Diet recommendations: Dysphagia 3 (mechanical soft);Thin liquid Liquids provided via: Cup;Straw Medication Administration: Whole meds with liquid Supervision: Patient able to self feed;Intermittent supervision to cue for compensatory strategies Compensations: Slow rate;Small sips/bites Postural Changes and/or  Swallow Maneuvers: Seated upright 90 degrees;Upright 30-60 min after meal                Oral Care Recommendations: Oral care BID Follow up Recommendations: None SLP Visit Diagnosis: Dysphagia, unspecified (R13.10) Plan: All goals met       GO                Jasmine Mccormick., M.A. [facility] Pager [phone] Office [phone]  04/06/2021, 9:05 AM

## 2021-04-08 ENCOUNTER — Telehealth: Payer: Self-pay | Admitting: Cardiology

## 2021-04-08 NOTE — Telephone Encounter (Signed)
Called the niece, Gillian Shields, and she reported that the pt is at [facility] and is being monitored there for her Warfarin/INR checks. She is aware to let us know once she is discharged so we can pick up warfarin management. Also, advised at that time if the dose and INR results are available that would be helpful. She wrote everything and will discuss once pt is ready discharge.

## 2021-04-08 NOTE — Telephone Encounter (Signed)
Jasmine Mccormick is calling stating she is calling  back to speak with [name] per her request.

## 2021-04-15 ENCOUNTER — Encounter (HOSPITAL_BASED_OUTPATIENT_CLINIC_OR_DEPARTMENT_OTHER): Payer: Self-pay | Admitting: Cardiology

## 2021-04-15 ENCOUNTER — Telehealth: Payer: Self-pay

## 2021-04-15 ENCOUNTER — Other Ambulatory Visit: Payer: Self-pay

## 2021-04-15 ENCOUNTER — Ambulatory Visit (INDEPENDENT_AMBULATORY_CARE_PROVIDER_SITE_OTHER): Payer: Medicare Other | Admitting: Cardiology

## 2021-04-15 VITALS — BP 136/88 | HR 109 | Ht 64.0 in | Wt 166.2 lb

## 2021-04-15 DIAGNOSIS — S32039D Unspecified fracture of third lumbar vertebra, subsequent encounter for fracture with routine healing: Secondary | ICD-10-CM | POA: Diagnosis not present

## 2021-04-15 DIAGNOSIS — Z9181 History of falling: Secondary | ICD-10-CM | POA: Diagnosis not present

## 2021-04-15 DIAGNOSIS — I4819 Other persistent atrial fibrillation: Secondary | ICD-10-CM

## 2021-04-15 DIAGNOSIS — M5440 Lumbago with sciatica, unspecified side: Secondary | ICD-10-CM | POA: Diagnosis not present

## 2021-04-15 DIAGNOSIS — K5190 Ulcerative colitis, unspecified, without complications: Secondary | ICD-10-CM | POA: Diagnosis not present

## 2021-04-15 DIAGNOSIS — E872 Acidosis: Secondary | ICD-10-CM | POA: Diagnosis not present

## 2021-04-15 DIAGNOSIS — D6869 Other thrombophilia: Secondary | ICD-10-CM | POA: Diagnosis not present

## 2021-04-15 DIAGNOSIS — I10 Essential (primary) hypertension: Secondary | ICD-10-CM

## 2021-04-15 DIAGNOSIS — K55069 Acute infarction of intestine, part and extent unspecified: Secondary | ICD-10-CM

## 2021-04-15 DIAGNOSIS — R600 Localized edema: Secondary | ICD-10-CM

## 2021-04-15 DIAGNOSIS — E039 Hypothyroidism, unspecified: Secondary | ICD-10-CM | POA: Diagnosis not present

## 2021-04-15 DIAGNOSIS — Z8616 Personal history of COVID-19: Secondary | ICD-10-CM | POA: Diagnosis not present

## 2021-04-15 DIAGNOSIS — E538 Deficiency of other specified B group vitamins: Secondary | ICD-10-CM | POA: Diagnosis not present

## 2021-04-15 DIAGNOSIS — Z7901 Long term (current) use of anticoagulants: Secondary | ICD-10-CM

## 2021-04-15 DIAGNOSIS — E782 Mixed hyperlipidemia: Secondary | ICD-10-CM | POA: Diagnosis not present

## 2021-04-15 DIAGNOSIS — E7800 Pure hypercholesterolemia, unspecified: Secondary | ICD-10-CM

## 2021-04-15 DIAGNOSIS — M810 Age-related osteoporosis without current pathological fracture: Secondary | ICD-10-CM | POA: Diagnosis not present

## 2021-04-15 DIAGNOSIS — Z7982 Long term (current) use of aspirin: Secondary | ICD-10-CM | POA: Diagnosis not present

## 2021-04-15 DIAGNOSIS — H8109 Meniere's disease, unspecified ear: Secondary | ICD-10-CM | POA: Diagnosis not present

## 2021-04-15 DIAGNOSIS — Z48815 Encounter for surgical aftercare following surgery on the digestive system: Secondary | ICD-10-CM | POA: Diagnosis not present

## 2021-04-15 NOTE — Progress Notes (Signed)
Cardiology Office Note:    Date:  04/15/2021   ID:  [name], DOB 16-Sep-1933, MRN [id number]  PCP:  Cassandria Anger, MD  Cardiologist:  Buford Dresser, MD  Referring MD: Cassandria Anger, MD   CC: post hospital follow up  History of Present Illness:    Jasmine Mccormick is a 87 y.o. female with a complex history, including ulcerative colitis s/p colectomy with ileostomy placement >20 years ago, HTN, and hypothyroidism, notable for recent distal SMA occlusion s/p thrombectomy. Found to have new atrial fibrillation during hospitalization 03/2021.   Now home after rehab, very happy to be home. Has noted more LE edema the last few days.  Here with her family member, brings a list of concerns: -she was not aware that verapamil was discontinued. This was stopped at discharge. Her heart rate was 78 bpm on her rehab paperwork. She has home health coming either today or tomorrow. Gave instructions on monitoring this. -INR from 04/07/21 2.26. INR 1.57 recently, told to do 6 mg tuesdays and saturdays, 3 mg the other -LE edema: EF 60-65% in the hospital. Had some swelling when she got home, a little worse today. She declines compression stockings, will work on elevation. -no blood in stool or blood in urine. No significant bleeding. -started on namenda and aricept, questioning this. Defer to primary care on this.  Still with intermittent difficulty swallowing pills, though stable to improving.  Cannot tell when she is in afib.   Past Medical History:  Diagnosis Date   COVID 12/18/2020   mild   HTN (hypertension)    Normal at home   Hyperlipidemia    Hypothyroidism    Ileostomy in place [facility])    Memory loss    mild but not dx by doctor per [facility]   Meniere disease 1996   Osteoporosis    UC (ulcerative colitis) [facility])     Past Surgical History:  Procedure Laterality Date   COLECTOMY     COLONOSCOPY     Ear Shunt for Menier's     ENDARTERECTOMY MESENTERIC  Right 03/29/2021   Procedure: MESENTERIC ARTERY THROMBECTOMY;  Surgeon: Serafina Mitchell, MD;  Location: [place];  Service: Vascular;  Laterality: Right;   ILEOSTOMY     INTRAOPERATIVE ARTERIOGRAM Right 03/29/2021   Procedure: INTRA OPERATIVE AORTOGRAM;  Surgeon: Serafina Mitchell, MD;  Location: [place];  Service: Vascular;  Laterality: Right;   KYPHOPLASTY N/A 01/07/2021   Procedure: Lumbar three Kyphoplasty;  Surgeon: Dawley, Theodoro Doing, DO;  Location: [place];  Service: Neurosurgery;  Laterality: N/A;   PROCTECTOMY     ULTRASOUND GUIDANCE FOR VASCULAR ACCESS Right 03/29/2021   Procedure: ULTRASOUND GUIDANCE FOR VASCULAR ACCESS;  Surgeon: Serafina Mitchell, MD;  [facility];  Service: Vascular;  Laterality: Right;    Current Medications: Current Outpatient Medications on File Prior to Visit  Medication Sig   alum & mag hydroxide-simeth (MAALOX/MYLANTA) 200-200-20 MG/5 SUSP Apply 1 application topically as needed (for skin irritation around stoma).   ASPIRIN 81 PO Take 1 tablet by mouth daily.   ezetimibe-simvastatin (VYTORIN) 10-20 MG tablet Take 1 tablet by mouth daily.   levothyroxine (SYNTHROID) 75 MCG tablet Take 1 tablet (75 mcg total) by mouth daily.   metoprolol succinate (TOPROL-XL) 50 MG 24 hr tablet Take 1 tablet (50 mg total) by mouth 2 (two) times daily. Take with or immediately following a meal.   Multiple Vitamins-Minerals (MULTIVITAMIN,TX-MINERALS) tablet Take 1 tablet by mouth daily.  warfarin (COUMADIN) 3 MG tablet Take 1 tablet (3 mg total) by mouth daily.   No current facility-administered medications on file prior to visit.     Allergies:   Patient has no known allergies.   Social History   Tobacco Use   Smoking status: Never   Smokeless tobacco: Never  Vaping Use   Vaping Use: Never used  Substance Use Topics   Alcohol use: No   Drug use: No    Family History: family history includes Breast cancer in her sister; Heart disease in an other family member;  Hypertension in an other family member.  ROS:   Please see the history of present illness.  Additional pertinent ROS otherwise unremarkable.   EKGs/Labs/Other Studies Reviewed:    The following studies were reviewed today: Echocardiogram 03/30/21:   1. Patient in rapid afib during study.   2. Left ventricular ejection fraction, by estimation, is 60 to 65%. The  left ventricle has normal function. The left ventricle has no regional  wall motion abnormalities. Left ventricular diastolic parameters are  indeterminate.   3. Right ventricular systolic function is normal. The right ventricular  size is normal. There is normal pulmonary artery systolic pressure.   4. Left atrial size was moderately dilated.   5. Right atrial size was moderately dilated.   6. The mitral valve is degenerative. Trivial mitral valve regurgitation.  No evidence of mitral stenosis. Moderate mitral annular calcification.   7. The aortic valve is tricuspid. Aortic valve regurgitation is not  visualized. Mild to moderate aortic valve sclerosis/calcification is  present, without any evidence of aortic stenosis.   8. The inferior vena cava is normal in size with greater than 50%  respiratory variability, suggesting right atrial pressure of 3 mmHg.  EKG:  EKG is personally reviewed.   04/15/21 afib RVR at 109 bpm  Recent Labs: 03/17/2021: TSH 9.46 03/31/2021: ALT 15 04/01/2021: BUN 16; Creatinine, Ser 0.93; Hemoglobin 11.2; Platelets 222; Potassium 4.1; Sodium 132  Recent Lipid Panel    Component Value Date/Time   CHOL 122 04/01/2021 0410   TRIG 119 04/01/2021 0410   HDL 38 (L) 04/01/2021 0410   CHOLHDL 3.2 04/01/2021 0410   VLDL 24 04/01/2021 0410   LDLCALC 60 04/01/2021 0410    Physical Exam:    VS:  BP 136/88   Pulse (!) 109   Ht 5' 4"  (1.626 m)   Wt 166 lb 3.2 oz (75.4 kg)   LMP  (LMP Unknown)   SpO2 98%   BMI 28.53 kg/m     Wt Readings from Last 3 Encounters:  04/15/21 166 lb 3.2 oz (75.4 kg)   04/01/21 173 lb 1 oz (78.5 kg)  03/17/21 166 lb (75.3 kg)    GEN: Well nourished, well developed in no acute distress HEENT: Normal, moist mucous membranes NECK: No JVD CARDIAC: irregularly irregular rhythm, normal S1 and S2, no rubs or gallops. No murmur. VASCULAR: Radial and DP pulses 2+ bilaterally. No carotid bruits RESPIRATORY:  Clear to auscultation without rales, wheezing or rhonchi  ABDOMEN: Soft, non-tender, non-distended MUSCULOSKELETAL:  Ambulates independently SKIN: Warm and dry, 1+ RLE edema and trivial LLE edema. No redness or warmth. NEUROLOGIC:  Alert and oriented x 3. No focal neuro deficits noted. PSYCHIATRIC:  Normal affect    ASSESSMENT:    1. Persistent atrial fibrillation ([place])   2. 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H[facility])   3. Essential hypertension   4. Secondary hypercoagulable state ([place])   5. Long term current use  of anticoagulant   6. Bilateral leg edema   7. Pure hypercholesterolemia    PLAN:    Paroxysmal atrial fibrillation, unknown chronicity, recent diagnosis -concern that SMA occlusion may have been embolic. Now on anticoagulation. Cannot afford DOAC unfortunately, so transitioning to coumadin. -chadsvasc at least 4 -HR elevated today, but was well controlled at the rehab center. Has home health coming soon, gave instructions on monitoring. If remains consistently >90 bpm at rest, will need to increase metoprolol to 75 mg twice daily -she is asymptomatic with her afib RVR. We discussed cardioversion, but will need weekly therapeutic INRs documented before this can happen. Readdress at follow up -vascular discharged her on aspirin in addition to coumadin. We had discussed stopping aspirin. No bleeding issues. Has vascular surgery follow up 9/19, will recommend they discuss if she needs aspirin as well   Acute mesenteric ischemia with obstruction of SMA -discharge summary and hospital course reviewed  LE edema: discussed compression, elevation.  Declines compression stockings. On anticoagulation, low concern for DVT. If worsens, she will contact her PCP   Hypercholesterolemia: LDL 60, ok to continue current regimen (simvastatin and ezetimibe). Alternative would be to change to high intensity statin, could then likely drop the ezetimibe and simplify her regimen. Appears she has been on the ezetimibe-simvastatin combination since at least 2008. She wishes to continue her current regimen  Cardiac risk counseling and prevention recommendations: -recommend heart healthy/Mediterranean diet, with whole grains, fruits, vegetable, fish, lean meats, nuts, and olive oil. Limit salt. -recommend moderate walking, 3-5 times/week for 30-50 minutes each session. Aim for at least 150 minutes.week. Goal should be pace of 3 miles/hours, or walking 1.5 miles in 30 minutes -recommend avoidance of tobacco products. Avoid excess alcohol. -ASCVD risk score: The ASCVD Risk score Mikey Bussing DC Jr., et al., 2013) failed to calculate for the following reasons:   The 2013 ASCVD risk score is only valid for ages 82 to 68    Plan for follow up:1 month  Buford Dresser, MD, PhD, [facility]    Medication Adjustments/Labs and Tests Ordered: Current medicines are reviewed at length with the patient today.  Concerns regarding medicines are outlined above.  Orders Placed This Encounter  Procedures   EKG 12-Lead   No orders of the defined types were placed in this encounter.   Patient Instructions  Medication Instructions:  Your Physician recommend you continue on your current medication as directed.    I am glad home health is coming. I would like your heart rate monitored periodically. I would like your resting heart rate to be less than 90 bpm (once you have been sitting for a time). If this is the case, we will increase your metoprolol from 50 mg twice daily to 75 mg twice daily and monitor from there.  *If you need a refill on your  cardiac medications before your next appointment, please call your pharmacy*   Lab Work: None ordered today   Testing/Procedures: None ordered today   Follow-Up: At [facility], you and your health needs are our priority.  As part of our continuing mission to provide you with exceptional heart care, we have created designated Provider Care Teams.  These Care Teams include your primary Cardiologist (physician) and Advanced Practice Providers (APPs -  Physician Assistants and Nurse Practitioners) who all work together to provide you with the care you need, when you need it.  We recommend signing up for the patient portal called "MyChart".  Sign up  information is provided on this After Visit Summary.  MyChart is used to connect with patients for Virtual Visits (Telemedicine).  Patients are able to view lab/test results, encounter notes, upcoming appointments, etc.  Non-urgent messages can be sent to your provider as well.   To learn more about what you can do with MyChart, go to [email or web address].    Your next appointment:   1 month(s)  The format for your next appointment:   In Person  Provider:   Buford Dresser, MD or Laurann Montana, NP    Signed, Buford Dresser, MD PhD 04/15/2021     [place]

## 2021-04-15 NOTE — Telephone Encounter (Signed)
Lmomed to make inr appt for new coumadin check

## 2021-04-15 NOTE — Telephone Encounter (Signed)
-----   Message from Buford Dresser, MD sent at 04/15/2021 11:25 AM EDT ----- Regarding: coumadin monitoring This patient is home now from rehab, needs follow up for INR. Eventually would like to be at [facility] but ok for [facility] in the meantime. Thanks! Bridgette Harrell Gave

## 2021-04-15 NOTE — Patient Instructions (Addendum)
Medication Instructions:  Your Physician recommend you continue on your current medication as directed.    I am glad home health is coming. I would like your heart rate monitored periodically. I would like your resting heart rate to be less than 90 bpm (once you have been sitting for a time). If this is the case, we will increase your metoprolol from 50 mg twice daily to 75 mg twice daily and monitor from there.  *If you need a refill on your cardiac medications before your next appointment, please call your pharmacy*   Lab Work: None ordered today   Testing/Procedures: None ordered today   Follow-Up: At [facility], you and your health needs are our priority.  As part of our continuing mission to provide you with exceptional heart care, we have created designated Provider Care Teams.  These Care Teams include your primary Cardiologist (physician) and Advanced Practice Providers (APPs -  Physician Assistants and Nurse Practitioners) who all work together to provide you with the care you need, when you need it.  We recommend signing up for the patient portal called "MyChart".  Sign up information is provided on this After Visit Summary.  MyChart is used to connect with patients for Virtual Visits (Telemedicine).  Patients are able to view lab/test results, encounter notes, upcoming appointments, etc.  Non-urgent messages can be sent to your provider as well.   To learn more about what you can do with MyChart, go to [email or web address].    Your next appointment:   1 month(s)  The format for your next appointment:   In Person  Provider:   Buford Dresser, MD or Laurann Montana, NP

## 2021-04-19 DIAGNOSIS — S32039D Unspecified fracture of third lumbar vertebra, subsequent encounter for fracture with routine healing: Secondary | ICD-10-CM | POA: Diagnosis not present

## 2021-04-19 DIAGNOSIS — I10 Essential (primary) hypertension: Secondary | ICD-10-CM | POA: Diagnosis not present

## 2021-04-19 DIAGNOSIS — K5190 Ulcerative colitis, unspecified, without complications: Secondary | ICD-10-CM | POA: Diagnosis not present

## 2021-04-19 DIAGNOSIS — M810 Age-related osteoporosis without current pathological fracture: Secondary | ICD-10-CM | POA: Diagnosis not present

## 2021-04-19 DIAGNOSIS — I4819 Other persistent atrial fibrillation: Secondary | ICD-10-CM | POA: Diagnosis not present

## 2021-04-19 DIAGNOSIS — Z48815 Encounter for surgical aftercare following surgery on the digestive system: Secondary | ICD-10-CM | POA: Diagnosis not present

## 2021-04-20 DIAGNOSIS — I10 Essential (primary) hypertension: Secondary | ICD-10-CM | POA: Diagnosis not present

## 2021-04-20 DIAGNOSIS — M810 Age-related osteoporosis without current pathological fracture: Secondary | ICD-10-CM | POA: Diagnosis not present

## 2021-04-20 DIAGNOSIS — K5190 Ulcerative colitis, unspecified, without complications: Secondary | ICD-10-CM | POA: Diagnosis not present

## 2021-04-20 DIAGNOSIS — S32039D Unspecified fracture of third lumbar vertebra, subsequent encounter for fracture with routine healing: Secondary | ICD-10-CM | POA: Diagnosis not present

## 2021-04-20 DIAGNOSIS — I4819 Other persistent atrial fibrillation: Secondary | ICD-10-CM | POA: Diagnosis not present

## 2021-04-20 DIAGNOSIS — Z48815 Encounter for surgical aftercare following surgery on the digestive system: Secondary | ICD-10-CM | POA: Diagnosis not present

## 2021-04-20 NOTE — Telephone Encounter (Signed)
Patient has been seen by MD since this message was generated. Encounter closed

## 2021-04-21 ENCOUNTER — Telehealth: Payer: Self-pay | Admitting: Internal Medicine

## 2021-04-21 ENCOUNTER — Telehealth: Payer: Self-pay | Admitting: Cardiology

## 2021-04-21 DIAGNOSIS — I4819 Other persistent atrial fibrillation: Secondary | ICD-10-CM | POA: Diagnosis not present

## 2021-04-21 DIAGNOSIS — M810 Age-related osteoporosis without current pathological fracture: Secondary | ICD-10-CM | POA: Diagnosis not present

## 2021-04-21 DIAGNOSIS — I10 Essential (primary) hypertension: Secondary | ICD-10-CM | POA: Diagnosis not present

## 2021-04-21 DIAGNOSIS — K5190 Ulcerative colitis, unspecified, without complications: Secondary | ICD-10-CM | POA: Diagnosis not present

## 2021-04-21 DIAGNOSIS — Z48815 Encounter for surgical aftercare following surgery on the digestive system: Secondary | ICD-10-CM | POA: Diagnosis not present

## 2021-04-21 DIAGNOSIS — S32039D Unspecified fracture of third lumbar vertebra, subsequent encounter for fracture with routine healing: Secondary | ICD-10-CM | POA: Diagnosis not present

## 2021-04-21 NOTE — Telephone Encounter (Signed)
Received call from [facility] regarding checking INR next week Advised ok to check and send results

## 2021-04-21 NOTE — Telephone Encounter (Signed)
Reviewed chart pt is up-to-date on appts. Mal Amabile gave verbal for SN orders.Marland KitchenJohny Chess

## 2021-04-21 NOTE — Telephone Encounter (Signed)
[place] Name: [facility] Agency Name: Santina Evans Phone #: [phone] Service Requested: Skilled Nursing (examples: OT/PT/Skilled Nursing/Social Work/Speech Therapy/Wound Care) Frequency of Visits: 1x for 4wks

## 2021-04-22 ENCOUNTER — Other Ambulatory Visit: Payer: Self-pay

## 2021-04-22 ENCOUNTER — Ambulatory Visit (INDEPENDENT_AMBULATORY_CARE_PROVIDER_SITE_OTHER): Payer: Medicare Other

## 2021-04-22 DIAGNOSIS — I4819 Other persistent atrial fibrillation: Secondary | ICD-10-CM | POA: Diagnosis not present

## 2021-04-22 DIAGNOSIS — S32039D Unspecified fracture of third lumbar vertebra, subsequent encounter for fracture with routine healing: Secondary | ICD-10-CM | POA: Diagnosis not present

## 2021-04-22 DIAGNOSIS — I10 Essential (primary) hypertension: Secondary | ICD-10-CM | POA: Diagnosis not present

## 2021-04-22 DIAGNOSIS — M810 Age-related osteoporosis without current pathological fracture: Secondary | ICD-10-CM | POA: Diagnosis not present

## 2021-04-22 DIAGNOSIS — Z5181 Encounter for therapeutic drug level monitoring: Secondary | ICD-10-CM | POA: Diagnosis not present

## 2021-04-22 DIAGNOSIS — Z48815 Encounter for surgical aftercare following surgery on the digestive system: Secondary | ICD-10-CM | POA: Diagnosis not present

## 2021-04-22 DIAGNOSIS — K5190 Ulcerative colitis, unspecified, without complications: Secondary | ICD-10-CM | POA: Diagnosis not present

## 2021-04-22 LAB — POCT INR: INR: 2.1 (ref 2.0–3.0)

## 2021-04-22 NOTE — Patient Instructions (Signed)
-   continue dosage of warfarin 1 tablet every day except 2 tablets on [place]. - Recheck INR w/ HH next Wednesday

## 2021-04-27 ENCOUNTER — Telehealth: Payer: Self-pay | Admitting: Internal Medicine

## 2021-04-27 DIAGNOSIS — S32039D Unspecified fracture of third lumbar vertebra, subsequent encounter for fracture with routine healing: Secondary | ICD-10-CM | POA: Diagnosis not present

## 2021-04-27 DIAGNOSIS — Z48815 Encounter for surgical aftercare following surgery on the digestive system: Secondary | ICD-10-CM | POA: Diagnosis not present

## 2021-04-27 DIAGNOSIS — M810 Age-related osteoporosis without current pathological fracture: Secondary | ICD-10-CM | POA: Diagnosis not present

## 2021-04-27 DIAGNOSIS — K5190 Ulcerative colitis, unspecified, without complications: Secondary | ICD-10-CM | POA: Diagnosis not present

## 2021-04-27 DIAGNOSIS — I10 Essential (primary) hypertension: Secondary | ICD-10-CM | POA: Diagnosis not present

## 2021-04-27 DIAGNOSIS — I4819 Other persistent atrial fibrillation: Secondary | ICD-10-CM | POA: Diagnosis not present

## 2021-04-27 NOTE — Telephone Encounter (Signed)
Bianca from [place] has called stating the patient BP reading was 148/90 today and wanted to make the provider aware.  Wyatt Portela [phone]

## 2021-04-28 ENCOUNTER — Ambulatory Visit (INDEPENDENT_AMBULATORY_CARE_PROVIDER_SITE_OTHER): Payer: Medicare Other | Admitting: Cardiovascular Disease

## 2021-04-28 DIAGNOSIS — M810 Age-related osteoporosis without current pathological fracture: Secondary | ICD-10-CM | POA: Diagnosis not present

## 2021-04-28 DIAGNOSIS — S32039D Unspecified fracture of third lumbar vertebra, subsequent encounter for fracture with routine healing: Secondary | ICD-10-CM | POA: Diagnosis not present

## 2021-04-28 DIAGNOSIS — Z5181 Encounter for therapeutic drug level monitoring: Secondary | ICD-10-CM

## 2021-04-28 DIAGNOSIS — Z48815 Encounter for surgical aftercare following surgery on the digestive system: Secondary | ICD-10-CM | POA: Diagnosis not present

## 2021-04-28 DIAGNOSIS — K5190 Ulcerative colitis, unspecified, without complications: Secondary | ICD-10-CM | POA: Diagnosis not present

## 2021-04-28 DIAGNOSIS — I4819 Other persistent atrial fibrillation: Secondary | ICD-10-CM | POA: Diagnosis not present

## 2021-04-28 DIAGNOSIS — I10 Essential (primary) hypertension: Secondary | ICD-10-CM | POA: Diagnosis not present

## 2021-04-28 LAB — POCT INR: INR: 2.5 (ref 2.0–3.0)

## 2021-04-29 DIAGNOSIS — S32039D Unspecified fracture of third lumbar vertebra, subsequent encounter for fracture with routine healing: Secondary | ICD-10-CM | POA: Diagnosis not present

## 2021-04-29 DIAGNOSIS — I10 Essential (primary) hypertension: Secondary | ICD-10-CM | POA: Diagnosis not present

## 2021-04-29 DIAGNOSIS — Z48815 Encounter for surgical aftercare following surgery on the digestive system: Secondary | ICD-10-CM | POA: Diagnosis not present

## 2021-04-29 DIAGNOSIS — K5190 Ulcerative colitis, unspecified, without complications: Secondary | ICD-10-CM | POA: Diagnosis not present

## 2021-04-29 DIAGNOSIS — I4819 Other persistent atrial fibrillation: Secondary | ICD-10-CM | POA: Diagnosis not present

## 2021-04-29 DIAGNOSIS — M810 Age-related osteoporosis without current pathological fracture: Secondary | ICD-10-CM | POA: Diagnosis not present

## 2021-04-29 NOTE — Telephone Encounter (Signed)
Noted. Thanks.

## 2021-05-03 ENCOUNTER — Other Ambulatory Visit: Payer: Self-pay

## 2021-05-03 ENCOUNTER — Ambulatory Visit (INDEPENDENT_AMBULATORY_CARE_PROVIDER_SITE_OTHER): Payer: Medicare Other | Admitting: Physician Assistant

## 2021-05-03 VITALS — BP 128/84 | HR 105 | Temp 97.2°F | Resp 16 | Ht 67.0 in | Wt 165.0 lb

## 2021-05-03 DIAGNOSIS — K55069 Acute infarction of intestine, part and extent unspecified: Secondary | ICD-10-CM

## 2021-05-03 NOTE — Progress Notes (Signed)
HISTORY AND PHYSICAL     CC:  follow up. Requesting Provider:  Plotnikov, Evie Lacks, MD  HPI: This is a 87 y.o. female who is here today for follow up for mesenteric ischemia.  She presented to the ER on 03/29/2021 at approximately 10 PM with complaints of abdominal pain.  She was found to possibly have blood in her ostomy bag.  She has a history of ulcerative colitis, and underwent colectomy with end ileostomy approximately 25 years ago.  She states that she began having abdominal pain intermittently approximately 2 months ago with changes in her output from her ileostomy bag.  This evening she developed severe abdominal pain and was reportedly noted to have some blood in her ostomy bag.  She was taken to the emergency department for further evaluation.  The patient is a medically managed for hypertension.  She is on a statin for hypercholesterolemia.  She is a non-smoker she had COVID back in May.  She was taken to the OR on 03/29/2021 and underwent mechanical thrombectomy of the SMA using the penumbra device via the right femoral artery.  The SMA occlusion was successfully recanalized using the penumbra CAT 6 device for mechanical thrombectomy.  Intra procedural findings:  The infrarenal abdominal aorta and iliac arteries are calcified but patent without significant stenosis.  Bilateral renal arteries are patent without significant stenosis.  The celiac artery opacifies.  The superior mesenteric artery appears to occlude approximately 10 cm beyond its origin with no distal reconstitution.  Minimal visualization of small bowel branches are visualized.  She did require ventilation due to respiratory failure post op.  She was extubated on POD 1.  She was placed on heparin for afib.  It was felt that this event could have been embolic and therefore was discharged on coumadin as she could not afford DOAC.  The bloody output in the ostomy cleared and her abdominal pain improved.  She was subsequently  discharged to SNF.  The pt returns today for follow up and accompanied by her niece.  She states that she is doing well.  She denies any abdominal pain.  She has not had any bloody drainage on her ostomy bag.  She remains on her coumadin and her last INR was 2.5.  her niece states that they increased her metoprolol.  Pt states that her husband died in 02/13/2023 after 53 years of marriage.  He had Lewy Body dementia.    The pt is on a statin for cholesterol management.    The pt is on an aspirin.    Other AC:  coumadin The pt is on BB for hypertension.  The pt does not have diabetes. Tobacco hx:  never    Past Medical History:  Diagnosis Date   COVID 12/18/2020   mild   HTN (hypertension)    Normal at home   Hyperlipidemia    Hypothyroidism    Ileostomy in place [facility])    Memory loss    mild but not dx by doctor per Neice   Meniere disease 1996   Osteoporosis    UC (ulcerative colitis) [facility])     Past Surgical History:  Procedure Laterality Date   COLECTOMY     COLONOSCOPY     Ear Shunt for Menier's     ENDARTERECTOMY MESENTERIC Right 03/29/2021   Procedure: MESENTERIC ARTERY THROMBECTOMY;  Surgeon: Serafina Mitchell, MD;  Location: [place];  Service: Vascular;  Laterality: Right;   ILEOSTOMY     INTRAOPERATIVE ARTERIOGRAM Right  03/29/2021   Procedure: INTRA OPERATIVE AORTOGRAM;  Surgeon: Serafina Mitchell, MD;  Location: [place];  Service: Vascular;  Laterality: Right;   KYPHOPLASTY N/A 01/07/2021   Procedure: Lumbar three Kyphoplasty;  Surgeon: Dawley, Theodoro Doing, DO;  Location: [place];  Service: Neurosurgery;  Laterality: N/A;   PROCTECTOMY     ULTRASOUND GUIDANCE FOR VASCULAR ACCESS Right 03/29/2021   Procedure: ULTRASOUND GUIDANCE FOR VASCULAR ACCESS;  Surgeon: Serafina Mitchell, MD;  [facility];  Service: Vascular;  Laterality: Right;    No Known Allergies  Current Outpatient Medications  Medication Sig Dispense Refill   alum & mag hydroxide-simeth (MAALOX/MYLANTA) 200-200-20  MG/5 SUSP Apply 1 application topically as needed (for skin irritation around stoma). 1 Bottle 0   ASPIRIN 81 PO Take 1 tablet by mouth daily.     ezetimibe-simvastatin (VYTORIN) 10-20 MG tablet Take 1 tablet by mouth daily.     levothyroxine (SYNTHROID) 75 MCG tablet Take 1 tablet (75 mcg total) by mouth daily. 90 tablet 1   metoprolol succinate (TOPROL-XL) 50 MG 24 hr tablet Take 1 tablet (50 mg total) by mouth 2 (two) times daily. Take with or immediately following a meal. 60 tablet 3   Multiple Vitamins-Minerals (MULTIVITAMIN,TX-MINERALS) tablet Take 1 tablet by mouth daily.     warfarin (COUMADIN) 3 MG tablet Take 1 tablet (3 mg total) by mouth daily. 30 tablet 1   No current facility-administered medications for this visit.    Family History  Problem Relation Age of Onset   Hypertension Other    Heart disease Other    Breast cancer Sister     Social History   Socioeconomic History   Marital status: Widowed    Spouse name: Not on file   Number of children: Not on file   Years of education: 12   Highest education level: Not on file  Occupational History   Occupation: Retired  Tobacco Use   Smoking status: Never   Smokeless tobacco: Never  Vaping Use   Vaping Use: Never used  Substance and Sexual Activity   Alcohol use: No   Drug use: No   Sexual activity: Not Currently    Birth control/protection: Post-menopausal  Other Topics Concern   Not on file  Social History Narrative   Not on file   Social Determinants of Health   Financial Resource Strain: Low Risk    Difficulty of Paying Living Expenses: Not hard at all  Food Insecurity: No Food Insecurity   Worried About Charity fundraiser in the Last Year: Never true   [place] in the Last Year: Never true  Transportation Needs: No Transportation Needs   Lack of Transportation (Medical): No   Lack of Transportation (Non-Medical): No  Physical Activity: Inactive   Days of Exercise per Week: 0 days   Minutes  of Exercise per Session: 0 min  Stress: No Stress Concern Present   Feeling of Stress : Not at all  Social Connections: Moderately Integrated   Frequency of Communication with Friends and Family: More than three times a week   Frequency of Social Gatherings with Friends and Family: More than three times a week   Attends Religious Services: More than 4 times per year   Active Member of [employer] or Organizations: Yes   Attends Archivist Meetings: More than 4 times per year   Marital Status: Widowed  Intimate Partner Violence: Not At Risk   Fear of Current or Ex-Partner: No  Emotionally Abused: No   Physically Abused: No   Sexually Abused: No     REVIEW OF SYSTEMS:   [X]  denotes positive finding, [ ]  denotes negative finding Cardiac  Comments:  Chest pain or chest pressure:    Shortness of breath upon exertion:    Short of breath when lying flat:    Irregular heart rhythm:        Vascular    Pain in calf, thigh, or hip brought on by ambulation:    Pain in feet at night that wakes you up from your sleep:     Blood clot in your veins:    Leg swelling:         Pulmonary    Oxygen at home:    Productive cough:     Wheezing:         Neurologic    Sudden weakness in arms or legs:     Sudden numbness in arms or legs:     Sudden onset of difficulty speaking or slurred speech:    Temporary loss of vision in one eye:     Problems with dizziness:         Gastrointestinal    Blood in stool:     Vomited blood:         Genitourinary    Burning when urinating:     Blood in urine:        Psychiatric    Major depression:         Hematologic    Bleeding problems:    Problems with blood clotting too easily:        Skin    Rashes or ulcers:        Constitutional    Fever or chills:      PHYSICAL EXAMINATION:  Today's Vitals   05/03/21 1527  BP: 128/84  Pulse: (!) 105  Resp: 16  Temp: (!) 97.2 F (36.2 C)  TempSrc: Temporal  SpO2: 97%  Weight: 165 lb  (74.8 kg)  Height: 5' 7"  (1.702 m)   Body mass index is 25.84 kg/m.   General:  WDWN in NAD; vital signs documented above Gait: slow gait with a cane HENT: WNL, normocephalic Pulmonary: normal non-labored breathing , without wheezing Cardiac: irregular HR, without  Murmur; without carotid bruits Abdomen: soft, NT, no masses; aortic pulse is not palpable Skin: without rashes Vascular Exam/Pulses:  Right Left  Radial 2+ (normal) 2+ (normal)  Popliteal Unable to palpate Unable to palpate  DP 2+ (normal) 1+ (weak)  PT Unable to palpate Unable to palpate   Extremities: without ischemic changes, without Gangrene , without cellulitis; without open wounds;  + pitting edema BLE Musculoskeletal: no muscle wasting or atrophy  Neurologic: A&O X 3;  No focal weakness or paresthesias are detected Psychiatric:  The pt has Normal affect.   Non-Invasive Vascular Imaging:   None today   ASSESSMENT/PLAN:: 87 y.o. female here for follow up for acute mesenteric ischemia s/p SMA thrombectomy on 03/29/2021 by Dr. Trula Slade   -pt doing well and does not have any abdominal pain.  Her bloody output in the ostomy bag has resolved.   -she will continue her coumadin as she remains in afib and this is followed by [facility].   -continue statin. -pt will f/u in 6 months with mesenteric duplex.  They will call sooner if there are any issues.    Leontine Locket, [facility] [phone]  Clinic MD:   Trula Slade

## 2021-05-04 ENCOUNTER — Telehealth: Payer: Self-pay | Admitting: Internal Medicine

## 2021-05-04 DIAGNOSIS — K5190 Ulcerative colitis, unspecified, without complications: Secondary | ICD-10-CM | POA: Diagnosis not present

## 2021-05-04 DIAGNOSIS — S32039D Unspecified fracture of third lumbar vertebra, subsequent encounter for fracture with routine healing: Secondary | ICD-10-CM | POA: Diagnosis not present

## 2021-05-04 DIAGNOSIS — M810 Age-related osteoporosis without current pathological fracture: Secondary | ICD-10-CM | POA: Diagnosis not present

## 2021-05-04 DIAGNOSIS — Z48815 Encounter for surgical aftercare following surgery on the digestive system: Secondary | ICD-10-CM | POA: Diagnosis not present

## 2021-05-04 DIAGNOSIS — I10 Essential (primary) hypertension: Secondary | ICD-10-CM | POA: Diagnosis not present

## 2021-05-04 DIAGNOSIS — I4819 Other persistent atrial fibrillation: Secondary | ICD-10-CM | POA: Diagnosis not present

## 2021-05-04 NOTE — Telephone Encounter (Signed)
Brionka from [place] called   Reported pt's BP at rest was 138/94 and after activity 154/90.   Best callback #: [phone]

## 2021-05-05 ENCOUNTER — Ambulatory Visit (INDEPENDENT_AMBULATORY_CARE_PROVIDER_SITE_OTHER): Payer: Medicare Other | Admitting: Cardiology

## 2021-05-05 ENCOUNTER — Encounter (HOSPITAL_BASED_OUTPATIENT_CLINIC_OR_DEPARTMENT_OTHER): Payer: Self-pay | Admitting: Cardiology

## 2021-05-05 ENCOUNTER — Telehealth: Payer: Self-pay | Admitting: Cardiology

## 2021-05-05 DIAGNOSIS — Z5181 Encounter for therapeutic drug level monitoring: Secondary | ICD-10-CM

## 2021-05-05 DIAGNOSIS — I10 Essential (primary) hypertension: Secondary | ICD-10-CM | POA: Diagnosis not present

## 2021-05-05 DIAGNOSIS — Z48815 Encounter for surgical aftercare following surgery on the digestive system: Secondary | ICD-10-CM | POA: Diagnosis not present

## 2021-05-05 DIAGNOSIS — I4819 Other persistent atrial fibrillation: Secondary | ICD-10-CM

## 2021-05-05 DIAGNOSIS — M810 Age-related osteoporosis without current pathological fracture: Secondary | ICD-10-CM | POA: Diagnosis not present

## 2021-05-05 DIAGNOSIS — K5190 Ulcerative colitis, unspecified, without complications: Secondary | ICD-10-CM | POA: Diagnosis not present

## 2021-05-05 DIAGNOSIS — S32039D Unspecified fracture of third lumbar vertebra, subsequent encounter for fracture with routine healing: Secondary | ICD-10-CM | POA: Diagnosis not present

## 2021-05-05 LAB — POCT INR: INR: 2.9 (ref 2.0–3.0)

## 2021-05-05 NOTE — Telephone Encounter (Signed)
Crystal from [place] called with critical INR of 2.9.   Ok to call back with coumadin dosage moving forward.

## 2021-05-05 NOTE — Telephone Encounter (Signed)
I made a separate anticoagulation encounter for this to be dosed from

## 2021-05-06 ENCOUNTER — Other Ambulatory Visit: Payer: Self-pay

## 2021-05-06 DIAGNOSIS — Z7901 Long term (current) use of anticoagulants: Secondary | ICD-10-CM

## 2021-05-06 DIAGNOSIS — S32039D Unspecified fracture of third lumbar vertebra, subsequent encounter for fracture with routine healing: Secondary | ICD-10-CM | POA: Diagnosis not present

## 2021-05-06 DIAGNOSIS — H8109 Meniere's disease, unspecified ear: Secondary | ICD-10-CM | POA: Diagnosis not present

## 2021-05-06 DIAGNOSIS — I4819 Other persistent atrial fibrillation: Secondary | ICD-10-CM | POA: Diagnosis not present

## 2021-05-06 DIAGNOSIS — E538 Deficiency of other specified B group vitamins: Secondary | ICD-10-CM | POA: Diagnosis not present

## 2021-05-06 DIAGNOSIS — M5440 Lumbago with sciatica, unspecified side: Secondary | ICD-10-CM | POA: Diagnosis not present

## 2021-05-06 DIAGNOSIS — K5190 Ulcerative colitis, unspecified, without complications: Secondary | ICD-10-CM | POA: Diagnosis not present

## 2021-05-06 DIAGNOSIS — I10 Essential (primary) hypertension: Secondary | ICD-10-CM | POA: Diagnosis not present

## 2021-05-06 DIAGNOSIS — Z8616 Personal history of COVID-19: Secondary | ICD-10-CM

## 2021-05-06 DIAGNOSIS — M810 Age-related osteoporosis without current pathological fracture: Secondary | ICD-10-CM | POA: Diagnosis not present

## 2021-05-06 DIAGNOSIS — E782 Mixed hyperlipidemia: Secondary | ICD-10-CM | POA: Diagnosis not present

## 2021-05-06 DIAGNOSIS — Z9181 History of falling: Secondary | ICD-10-CM

## 2021-05-06 DIAGNOSIS — Z48815 Encounter for surgical aftercare following surgery on the digestive system: Secondary | ICD-10-CM | POA: Diagnosis not present

## 2021-05-06 DIAGNOSIS — Z7982 Long term (current) use of aspirin: Secondary | ICD-10-CM

## 2021-05-06 DIAGNOSIS — E872 Acidosis: Secondary | ICD-10-CM | POA: Diagnosis not present

## 2021-05-06 DIAGNOSIS — K55069 Acute infarction of intestine, part and extent unspecified: Secondary | ICD-10-CM

## 2021-05-06 DIAGNOSIS — E039 Hypothyroidism, unspecified: Secondary | ICD-10-CM | POA: Diagnosis not present

## 2021-05-06 NOTE — Telephone Encounter (Signed)
Noted.  It is okay.  Thanks

## 2021-05-09 ENCOUNTER — Encounter (HOSPITAL_BASED_OUTPATIENT_CLINIC_OR_DEPARTMENT_OTHER): Payer: Self-pay

## 2021-05-12 ENCOUNTER — Telehealth: Payer: Self-pay | Admitting: Cardiology

## 2021-05-12 ENCOUNTER — Ambulatory Visit (INDEPENDENT_AMBULATORY_CARE_PROVIDER_SITE_OTHER): Payer: Medicare Other | Admitting: Cardiology

## 2021-05-12 DIAGNOSIS — K5190 Ulcerative colitis, unspecified, without complications: Secondary | ICD-10-CM | POA: Diagnosis not present

## 2021-05-12 DIAGNOSIS — M810 Age-related osteoporosis without current pathological fracture: Secondary | ICD-10-CM | POA: Diagnosis not present

## 2021-05-12 DIAGNOSIS — I4819 Other persistent atrial fibrillation: Secondary | ICD-10-CM | POA: Diagnosis not present

## 2021-05-12 DIAGNOSIS — Z5181 Encounter for therapeutic drug level monitoring: Secondary | ICD-10-CM

## 2021-05-12 DIAGNOSIS — Z23 Encounter for immunization: Secondary | ICD-10-CM | POA: Diagnosis not present

## 2021-05-12 DIAGNOSIS — I10 Essential (primary) hypertension: Secondary | ICD-10-CM | POA: Diagnosis not present

## 2021-05-12 DIAGNOSIS — S32039D Unspecified fracture of third lumbar vertebra, subsequent encounter for fracture with routine healing: Secondary | ICD-10-CM | POA: Diagnosis not present

## 2021-05-12 DIAGNOSIS — Z48815 Encounter for surgical aftercare following surgery on the digestive system: Secondary | ICD-10-CM | POA: Diagnosis not present

## 2021-05-12 LAB — POCT INR: INR: 5.4 — AB (ref 2.0–3.0)

## 2021-05-12 NOTE — Telephone Encounter (Signed)
Received a call from [place] with [facility] calling to report INR 5.4.Patient is taking Coumadin 3 mg daily except 6 mg on Tue.& Sat.Advised I will send message to [facility] Clinic.

## 2021-05-12 NOTE — Telephone Encounter (Signed)
Created anticoagulation encounter to dose from

## 2021-05-12 NOTE — Telephone Encounter (Signed)
[place] with [facility] is calling to report critical INR of 5.4.

## 2021-05-13 DIAGNOSIS — K5190 Ulcerative colitis, unspecified, without complications: Secondary | ICD-10-CM | POA: Diagnosis not present

## 2021-05-13 DIAGNOSIS — S32039D Unspecified fracture of third lumbar vertebra, subsequent encounter for fracture with routine healing: Secondary | ICD-10-CM | POA: Diagnosis not present

## 2021-05-13 DIAGNOSIS — I10 Essential (primary) hypertension: Secondary | ICD-10-CM | POA: Diagnosis not present

## 2021-05-13 DIAGNOSIS — Z48815 Encounter for surgical aftercare following surgery on the digestive system: Secondary | ICD-10-CM | POA: Diagnosis not present

## 2021-05-13 DIAGNOSIS — I4819 Other persistent atrial fibrillation: Secondary | ICD-10-CM | POA: Diagnosis not present

## 2021-05-13 DIAGNOSIS — M810 Age-related osteoporosis without current pathological fracture: Secondary | ICD-10-CM | POA: Diagnosis not present

## 2021-05-15 DIAGNOSIS — E039 Hypothyroidism, unspecified: Secondary | ICD-10-CM | POA: Diagnosis not present

## 2021-05-15 DIAGNOSIS — Z7982 Long term (current) use of aspirin: Secondary | ICD-10-CM | POA: Diagnosis not present

## 2021-05-15 DIAGNOSIS — I4819 Other persistent atrial fibrillation: Secondary | ICD-10-CM | POA: Diagnosis not present

## 2021-05-15 DIAGNOSIS — Z9181 History of falling: Secondary | ICD-10-CM | POA: Diagnosis not present

## 2021-05-15 DIAGNOSIS — K5190 Ulcerative colitis, unspecified, without complications: Secondary | ICD-10-CM | POA: Diagnosis not present

## 2021-05-15 DIAGNOSIS — Z7901 Long term (current) use of anticoagulants: Secondary | ICD-10-CM | POA: Diagnosis not present

## 2021-05-15 DIAGNOSIS — E872 Acidosis, unspecified: Secondary | ICD-10-CM | POA: Diagnosis not present

## 2021-05-15 DIAGNOSIS — H8109 Meniere's disease, unspecified ear: Secondary | ICD-10-CM | POA: Diagnosis not present

## 2021-05-15 DIAGNOSIS — M5440 Lumbago with sciatica, unspecified side: Secondary | ICD-10-CM | POA: Diagnosis not present

## 2021-05-15 DIAGNOSIS — M810 Age-related osteoporosis without current pathological fracture: Secondary | ICD-10-CM | POA: Diagnosis not present

## 2021-05-15 DIAGNOSIS — S32039D Unspecified fracture of third lumbar vertebra, subsequent encounter for fracture with routine healing: Secondary | ICD-10-CM | POA: Diagnosis not present

## 2021-05-15 DIAGNOSIS — Z8616 Personal history of COVID-19: Secondary | ICD-10-CM | POA: Diagnosis not present

## 2021-05-15 DIAGNOSIS — E538 Deficiency of other specified B group vitamins: Secondary | ICD-10-CM | POA: Diagnosis not present

## 2021-05-15 DIAGNOSIS — Z48815 Encounter for surgical aftercare following surgery on the digestive system: Secondary | ICD-10-CM | POA: Diagnosis not present

## 2021-05-15 DIAGNOSIS — I10 Essential (primary) hypertension: Secondary | ICD-10-CM | POA: Diagnosis not present

## 2021-05-15 DIAGNOSIS — E782 Mixed hyperlipidemia: Secondary | ICD-10-CM | POA: Diagnosis not present

## 2021-05-17 ENCOUNTER — Other Ambulatory Visit: Payer: Self-pay

## 2021-05-17 ENCOUNTER — Telehealth: Payer: Self-pay | Admitting: Internal Medicine

## 2021-05-17 ENCOUNTER — Ambulatory Visit (INDEPENDENT_AMBULATORY_CARE_PROVIDER_SITE_OTHER): Payer: Medicare Other | Admitting: Family

## 2021-05-17 ENCOUNTER — Ambulatory Visit (HOSPITAL_BASED_OUTPATIENT_CLINIC_OR_DEPARTMENT_OTHER): Payer: Medicare Other | Admitting: Family

## 2021-05-17 ENCOUNTER — Encounter (HOSPITAL_BASED_OUTPATIENT_CLINIC_OR_DEPARTMENT_OTHER): Payer: Self-pay | Admitting: Family

## 2021-05-17 VITALS — BP 128/68 | HR 116 | Ht 65.0 in | Wt 167.0 lb

## 2021-05-17 DIAGNOSIS — I480 Paroxysmal atrial fibrillation: Secondary | ICD-10-CM | POA: Diagnosis not present

## 2021-05-17 DIAGNOSIS — R600 Localized edema: Secondary | ICD-10-CM | POA: Diagnosis not present

## 2021-05-17 DIAGNOSIS — D6869 Other thrombophilia: Secondary | ICD-10-CM | POA: Diagnosis not present

## 2021-05-17 DIAGNOSIS — I4891 Unspecified atrial fibrillation: Secondary | ICD-10-CM | POA: Diagnosis not present

## 2021-05-17 DIAGNOSIS — E782 Mixed hyperlipidemia: Secondary | ICD-10-CM

## 2021-05-17 MED ORDER — METOPROLOL SUCCINATE ER 50 MG PO TB24: 75.0000 mg | ORAL_TABLET | Freq: Two times a day (BID) | ORAL | 2 refills | Status: DC

## 2021-05-17 MED ORDER — FUROSEMIDE 20 MG PO TABS: ORAL_TABLET | ORAL | 0 refills | Status: DC

## 2021-05-17 NOTE — Telephone Encounter (Signed)
See below

## 2021-05-17 NOTE — Progress Notes (Signed)
Office Visit    Patient Name: Jasmine Mccormick  Date of Encounter: 05/17/2021  PCP:  Cassandria Anger, MD   [place]  Cardiologist:  Buford Dresser, MD  Advanced Practice Provider:  No care team member to display Electrophysiologist:  None     Chief Complaint    Jasmine Mccormick is a 87 y.o. female with a hx of  ulcerative colitis s/p colectomy with ileostomy placement greater than 20 years ago, hypertension, hypothyroidism, distal SMA occlusion s/p thrombectomy, atrial fibrillation presents today for follow up of atrial fibrillation.   Past Medical History    Past Medical History:  Diagnosis Date   COVID 12/18/2020   mild   HTN (hypertension)    Normal at home   Hyperlipidemia    Hypothyroidism    Ileostomy in place [facility])    Memory loss    mild but not dx by doctor per [place]   Meniere disease 1996   Osteoporosis    UC (ulcerative colitis) ([place])    Past Surgical History:  Procedure Laterality Date   COLECTOMY     COLONOSCOPY     Ear Shunt for Menier's     ENDARTERECTOMY MESENTERIC Right 03/29/2021   Procedure: MESENTERIC ARTERY THROMBECTOMY;  Surgeon: Serafina Mitchell, MD;  Location: [place];  Service: Vascular;  Laterality: Right;   ILEOSTOMY     INTRAOPERATIVE ARTERIOGRAM Right 03/29/2021   Procedure: INTRA OPERATIVE AORTOGRAM;  Surgeon: Serafina Mitchell, MD;  Location: [place];  Service: Vascular;  Laterality: Right;   KYPHOPLASTY N/A 01/07/2021   Procedure: Lumbar three Kyphoplasty;  Surgeon: Dawley, Theodoro Doing, DO;  Location: [place];  Service: Neurosurgery;  Laterality: N/A;   PROCTECTOMY     ULTRASOUND GUIDANCE FOR VASCULAR ACCESS Right 03/29/2021   Procedure: ULTRASOUND GUIDANCE FOR VASCULAR ACCESS;  Surgeon: Serafina Mitchell, MD;  Location: [place];  Service: Vascular;  Laterality: Right;    Allergies  No Known Allergies  History of Present Illness    Jasmine Mccormick is a 87 y.o. female with a hx of ulcerative colitis  s/p colectomy with ileostomy placement greater than 20 years ago, hypertension, hypothyroidism, distal SMA occlusion s/p thrombectomy, atrial fibrillation last seen 04/15/21 by Dr. Harrell Gave.  She was admitted 03/28/2021 after presenting with abdominal pain.  CT scan showed occlusion of distal superior mesenteric artery with partial reconstitution of flow and moderate to marked severity diffuse calcification and atherosclerosis throughout remaining arterial structures within the abdomen and pelvis.  She underwent thrombectomy by Dr. Trula Slade.  She did have atrial fibrillation during admission and anticoagulation was started.  She was eventually transitioned to warfarin due to cost.  Verapamil was discontinued during admission due to bradycardia.  Echocardiogram during admission 03/30/2021 with LVEF 60 to 65%, RV normal size and function, normal PASP, bilateral atria moderately dilated, trivial MR, mild to moderate aortic valve sclerosis without stenosis.  She was seen in follow-up by Dr. Harrell Gave 04/15/2021 and was going to be home from rehab.  She did note some mild lower extremity edema.  She declined compression stockings and was courage to elevate her lower extremity.  She was asymptomatic in regards to her atrial fibrillation.  Her metoprolol was increased to 75 mg twice daily due to tachycardia.  INR readings: (goal 2-3) 04/22/21 2.1 ? 04/28/21 2.5 ? 05/05/21 2.9 ? 05/12/21 5.4 Recheck upcoming 05/19/21  She presents today for follow-up with her niece. She has a Sport and exercise psychologist and [place] [facility]. They are working  with increasing stamina. We discussed that poor stamina could be related to her atrial fibrillation. Her lower extremity edema is about the same. She is sitting with her feet up but not much improvement. She politely declines compression stockings. Reports no shortness of breath and stable dyspnea on exertion. Reports no chest pain, pressure, or tightness. No  orthopnea, PND. Reports no palpitations.     EKGs/Labs/Other Studies Reviewed:   The following studies were reviewed today:  Echo 03/30/2021  1. Patient in rapid afib during study.   2. Left ventricular ejection fraction, by estimation, is 60 to 65%. The  left ventricle has normal function. The left ventricle has no regional  wall motion abnormalities. Left ventricular diastolic parameters are  indeterminate.   3. Right ventricular systolic function is normal. The right ventricular  size is normal. There is normal pulmonary artery systolic pressure.   4. Left atrial size was moderately dilated.   5. Right atrial size was moderately dilated.   6. The mitral valve is degenerative. Trivial mitral valve regurgitation.  No evidence of mitral stenosis. Moderate mitral annular calcification.   7. The aortic valve is tricuspid. Aortic valve regurgitation is not  visualized. Mild to moderate aortic valve sclerosis/calcification is  present, without any evidence of aortic stenosis.   8. The inferior vena cava is normal in size with greater than 50%  respiratory variability, suggesting right atrial pressure of 3 mmHg.   EKG:  EKG is  ordered today.  The ekg ordered today demonstrates atrial fibrillation 116 bpm with no acute ST/T wave changes.  Recent Labs: 03/17/2021: TSH 9.46 03/31/2021: ALT 15 04/01/2021: BUN 16; Creatinine, Ser 0.93; Hemoglobin 11.2; Platelets 222; Potassium 4.1; Sodium 132  Recent Lipid Panel    Component Value Date/Time   CHOL 122 04/01/2021 0410   TRIG 119 04/01/2021 0410   HDL 38 (L) 04/01/2021 0410   CHOLHDL 3.2 04/01/2021 0410   VLDL 24 04/01/2021 0410   LDLCALC 60 04/01/2021 0410    Risk Assessment/Calculations:   CHA2DS2-VASc Score = 4   This indicates a 4.8% annual risk of stroke. The patient's score is based upon: CHF History: 0 HTN History: 1 Diabetes History: 0 Stroke History: 0 Vascular Disease History: 0 Age Score: 2 Gender Score: 1   Home Medications   Current Meds  Medication Sig    calcium carbonate (TUMS - DOSED IN MG ELEMENTAL CALCIUM) 500 MG chewable tablet Chew 1 tablet by mouth as needed for indigestion or heartburn.   ezetimibe-simvastatin (VYTORIN) 10-20 MG tablet Take 1 tablet by mouth daily.   levothyroxine (SYNTHROID) 75 MCG tablet Take 1 tablet (75 mcg total) by mouth daily.   metoprolol succinate (TOPROL-XL) 50 MG 24 hr tablet Take 1 tablet (50 mg total) by mouth 2 (two) times daily. Take with or immediately following a meal.   Multiple Vitamins-Minerals (MULTIVITAMIN,TX-MINERALS) tablet Take 1 tablet by mouth daily.   warfarin (COUMADIN) 3 MG tablet Take 1 tablet (3 mg total) by mouth daily.     Review of Systems      All other systems reviewed and are otherwise negative except as noted above.  Physical Exam    VS:  BP 128/68   Pulse (!) 116   Ht 5' 5"  (1.651 m)   Wt 167 lb (75.8 kg)   LMP  (LMP Unknown)   SpO2 95%   BMI 27.79 kg/m  , BMI Body mass index is 27.79 kg/m.  Wt Readings from Last 3 Encounters:  05/17/21 167 lb (  75.8 kg)  05/03/21 165 lb (74.8 kg)  04/15/21 166 lb 3.2 oz (75.4 kg)    GEN: Well nourished, well developed, in no acute distress. HEENT: normal. Neck: Supple, no JVD, carotid bruits, or masses. Cardiac: RRR, no murmurs, rubs, or gallops. No clubbing, cyanosis. Bilateral LE with 1+ pitting edema.  Radials/PT 2+ and equal bilaterally.  Respiratory:  Respirations regular and unlabored, clear to auscultation bilaterally. GI: Soft, nontender, nondistended. MS: No deformity or atrophy. Skin: Warm and dry, no rash. Neuro:  Strength and sensation are intact. Psych: Normal affect.  Assessment & Plan    PAF / Chronic anticoagulation - EKG today atrial fibrillation 116bpm. Will increase Toprol to 69m BID. We discussed cardioversion and she prefers to trial one additional medication change prior to pursuing. Gave handout about cardioversion. She has had therapeutic INR for 4 weeks and continues to follow with Coumadin Clinic.    Acute mesenteric ischemia with obstruction of SMA -s/p thrombectomy 03/2021.  Continue to follow with vascular surgery. No recurrent abdominal pain.   Bilateral LE edema - likely venous insufficiency and rapid atrial fibrillation. Politely declines compression stockings. Not much improvement with elevating her lower extremities. Will add Lasix 26mdaily for 3 days.   HLD -most recent LDL 60.  Continue simvastatin and Zetia.  Hypothyroidism - Continue to follow with PCP.   Disposition: Follow up in 2 week(s) with Dr. ChHarrell Gaver APP.  Signed, CaLoel DubonnetNP 05/17/2021, 10:22 AM [place]

## 2021-05-17 NOTE — Patient Instructions (Addendum)
Medication Instructions:  Your physician has recommended you make the following change in your medication:    START Furosemide (Lasix) one 71m tablet daily for 3 days  CHANGE Metoprolol Succinate to 1.5 tablets (739m twice per day  *If you need a refill on your cardiac medications before your next appointment, please call your pharmacy*  Lab Work: None ordered today.   Testing/Procedures: Your EKG today showed atrial fibrillation with a rate of 116 bpm.   Follow-Up: [facility].  As part of our continuing mission to provide you with exceptional heart care, we have created designated Provider Care Teams.  These Care Teams include your primary Cardiologist (physician) and Advanced Practice Providers (APPs -  Physician Assistants and Nurse Practitioners) who all work together to provide you with the care you need, when you need it.  We recommend signing up for the patient portal called "MyChart".  Sign up information is provided on this After Visit Summary.  MyChart is used to connect with patients for Virtual Visits (Telemedicine).  Patients are able to view lab/test results, encounter notes, upcoming appointments, etc.  Non-urgent messages can be sent to your provider as well.   To learn more about what you can do with MyChart, go to [email or web address]   Your next appointment:   2 week(s)  The format for your next appointment:   In Person  Provider:   BrBuford DresserMD or CaLoel DubonnetNP    Other Instructions  Electrical Cardioversion Electrical cardioversion is the delivery of a jolt of electricity to restore a normal rhythm to the heart. A rhythm that is too fast or is not regular keeps the heart from pumping well. In this procedure, sticky patches or metal paddles are placed on the chest to deliver electricity to the heart from a device. This procedure may be done in an emergency if: There is low or no blood  pressure as a result of the heart rhythm. Normal rhythm must be restored as fast as possible to protect the brain and heart from further damage. It may save a life. This may also be a scheduled procedure for irregular or fast heart rhythms that are not immediately life-threatening. Tell a health care provider about: Any allergies you have. All medicines you are taking, including vitamins, herbs, eye drops, creams, and over-the-counter medicines. Any problems you or family members have had with anesthetic medicines. Any blood disorders you have. Any surgeries you have had. Any medical conditions you have. Whether you are pregnant or may be pregnant. What are the risks? Generally, this is a safe procedure. However, problems may occur, including: Allergic reactions to medicines. A blood clot that breaks free and travels to other parts of your body. The possible return of an abnormal heart rhythm within hours or days after the procedure. Your heart stopping (cardiac arrest). This is rare. What happens before the procedure? Medicines Your health care provider may have you start taking: Blood-thinning medicines (anticoagulants) so your blood does not clot as easily. Medicines to help stabilize your heart rate and rhythm. Ask your health care provider about: Changing or stopping your regular medicines. This is especially important if you are taking diabetes medicines or blood thinners. Taking medicines such as aspirin and ibuprofen. These medicines can thin your blood. Do not take these medicines unless your health care provider tells you to take them. Taking over-the-counter medicines, vitamins, herbs, and supplements. General instructions Follow instructions from your  health care provider about eating or drinking restrictions. Plan to have someone take you home from the hospital or clinic. If you will be going home right after the procedure, plan to have someone with you for 24 hours. Ask  your health care provider what steps will be taken to help prevent infection. These may include washing your skin with a germ-killing soap. What happens during the procedure?  An IV will be inserted into one of your veins. Sticky patches (electrodes) or metal paddles may be placed on your chest. You will be given a medicine to help you relax (sedative). An electrical shock will be delivered. The procedure may vary among health care providers and hospitals. What can I expect after the procedure? Your blood pressure, heart rate, breathing rate, and blood oxygen level will be monitored until you leave the hospital or clinic. Your heart rhythm will be watched to make sure it does not change. You may have some redness on the skin where the shocks were given. Follow these instructions at home: Do not drive for 24 hours if you were given a sedative during your procedure. Take over-the-counter and prescription medicines only as told by your health care provider. Ask your health care provider how to check your pulse. Check it often. Rest for 48 hours after the procedure or as told by your health care provider. Avoid or limit your caffeine use as told by your health care provider. Keep all follow-up visits as told by your health care provider. This is important. Contact a health care provider if: You feel like your heart is beating too quickly or your pulse is not regular. You have a serious muscle cramp that does not go away. Get help right away if: You have discomfort in your chest. You are dizzy or you feel faint. You have trouble breathing or you are short of breath. Your speech is slurred. You have trouble moving an arm or leg on one side of your body. Your fingers or toes turn cold or blue. Summary Electrical cardioversion is the delivery of a jolt of electricity to restore a normal rhythm to the heart. This procedure may be done right away in an emergency or may be a scheduled procedure  if the condition is not an emergency. Generally, this is a safe procedure. After the procedure, check your pulse often as told by your health care provider. This information is not intended to replace advice given to you by your health care provider. Make sure you discuss any questions you have with your health care provider. Document Revised: 03/04/2019 Document Reviewed: 03/04/2019 Elsevier Patient Education  [place].

## 2021-05-17 NOTE — Telephone Encounter (Signed)
[place] Name: [facility] Agency Name: Santina Evans Phone #: [phone] Service Requested: PT (examples: OT/PT/Skilled Nursing/Social Work/Speech Therapy/Wound Care) Frequency of Visits: 1x for 4 wk

## 2021-05-18 DIAGNOSIS — Z48815 Encounter for surgical aftercare following surgery on the digestive system: Secondary | ICD-10-CM | POA: Diagnosis not present

## 2021-05-18 DIAGNOSIS — M810 Age-related osteoporosis without current pathological fracture: Secondary | ICD-10-CM | POA: Diagnosis not present

## 2021-05-18 DIAGNOSIS — S32039D Unspecified fracture of third lumbar vertebra, subsequent encounter for fracture with routine healing: Secondary | ICD-10-CM | POA: Diagnosis not present

## 2021-05-18 DIAGNOSIS — I10 Essential (primary) hypertension: Secondary | ICD-10-CM | POA: Diagnosis not present

## 2021-05-18 DIAGNOSIS — K5190 Ulcerative colitis, unspecified, without complications: Secondary | ICD-10-CM | POA: Diagnosis not present

## 2021-05-18 DIAGNOSIS — I4819 Other persistent atrial fibrillation: Secondary | ICD-10-CM | POA: Diagnosis not present

## 2021-05-18 NOTE — Telephone Encounter (Signed)
OK w/me Thx

## 2021-05-18 NOTE — Telephone Encounter (Signed)
Verbal given 

## 2021-05-19 ENCOUNTER — Ambulatory Visit (INDEPENDENT_AMBULATORY_CARE_PROVIDER_SITE_OTHER): Payer: Medicare Other | Admitting: Cardiology

## 2021-05-19 DIAGNOSIS — I10 Essential (primary) hypertension: Secondary | ICD-10-CM | POA: Diagnosis not present

## 2021-05-19 DIAGNOSIS — M810 Age-related osteoporosis without current pathological fracture: Secondary | ICD-10-CM | POA: Diagnosis not present

## 2021-05-19 DIAGNOSIS — Z5181 Encounter for therapeutic drug level monitoring: Secondary | ICD-10-CM

## 2021-05-19 DIAGNOSIS — K5190 Ulcerative colitis, unspecified, without complications: Secondary | ICD-10-CM | POA: Diagnosis not present

## 2021-05-19 DIAGNOSIS — I4819 Other persistent atrial fibrillation: Secondary | ICD-10-CM

## 2021-05-19 DIAGNOSIS — S32039D Unspecified fracture of third lumbar vertebra, subsequent encounter for fracture with routine healing: Secondary | ICD-10-CM | POA: Diagnosis not present

## 2021-05-19 DIAGNOSIS — Z48815 Encounter for surgical aftercare following surgery on the digestive system: Secondary | ICD-10-CM | POA: Diagnosis not present

## 2021-05-19 LAB — POCT INR: INR: 3.1 — AB (ref 2.0–3.0)

## 2021-05-24 ENCOUNTER — Other Ambulatory Visit: Payer: Self-pay

## 2021-05-24 ENCOUNTER — Other Ambulatory Visit: Payer: Self-pay | Admitting: Physician Assistant

## 2021-05-24 ENCOUNTER — Encounter (HOSPITAL_BASED_OUTPATIENT_CLINIC_OR_DEPARTMENT_OTHER): Payer: Self-pay

## 2021-05-24 MED ORDER — WARFARIN SODIUM 3 MG PO TABS: 3.0000 mg | ORAL_TABLET | Freq: Every day | ORAL | 1 refills | Status: DC

## 2021-05-24 NOTE — Telephone Encounter (Signed)
on coumadiin for A-fib, refills should be sent to [facility]

## 2021-05-25 DIAGNOSIS — K5190 Ulcerative colitis, unspecified, without complications: Secondary | ICD-10-CM | POA: Diagnosis not present

## 2021-05-25 DIAGNOSIS — I10 Essential (primary) hypertension: Secondary | ICD-10-CM | POA: Diagnosis not present

## 2021-05-25 DIAGNOSIS — I4819 Other persistent atrial fibrillation: Secondary | ICD-10-CM | POA: Diagnosis not present

## 2021-05-25 DIAGNOSIS — S32039D Unspecified fracture of third lumbar vertebra, subsequent encounter for fracture with routine healing: Secondary | ICD-10-CM | POA: Diagnosis not present

## 2021-05-25 DIAGNOSIS — M810 Age-related osteoporosis without current pathological fracture: Secondary | ICD-10-CM | POA: Diagnosis not present

## 2021-05-25 DIAGNOSIS — Z48815 Encounter for surgical aftercare following surgery on the digestive system: Secondary | ICD-10-CM | POA: Diagnosis not present

## 2021-05-26 ENCOUNTER — Ambulatory Visit (INDEPENDENT_AMBULATORY_CARE_PROVIDER_SITE_OTHER): Payer: Medicare Other | Admitting: Cardiology

## 2021-05-26 DIAGNOSIS — Z48815 Encounter for surgical aftercare following surgery on the digestive system: Secondary | ICD-10-CM | POA: Diagnosis not present

## 2021-05-26 DIAGNOSIS — K5190 Ulcerative colitis, unspecified, without complications: Secondary | ICD-10-CM | POA: Diagnosis not present

## 2021-05-26 DIAGNOSIS — M810 Age-related osteoporosis without current pathological fracture: Secondary | ICD-10-CM | POA: Diagnosis not present

## 2021-05-26 DIAGNOSIS — I4819 Other persistent atrial fibrillation: Secondary | ICD-10-CM

## 2021-05-26 DIAGNOSIS — I10 Essential (primary) hypertension: Secondary | ICD-10-CM | POA: Diagnosis not present

## 2021-05-26 DIAGNOSIS — Z5181 Encounter for therapeutic drug level monitoring: Secondary | ICD-10-CM | POA: Diagnosis not present

## 2021-05-26 DIAGNOSIS — S32039D Unspecified fracture of third lumbar vertebra, subsequent encounter for fracture with routine healing: Secondary | ICD-10-CM | POA: Diagnosis not present

## 2021-05-26 LAB — POCT INR: INR: 3.9 — AB (ref 2.0–3.0)

## 2021-05-27 ENCOUNTER — Telehealth: Payer: Self-pay

## 2021-05-27 NOTE — Telephone Encounter (Signed)
Prolia VOB initiated via [email or web address]  Last OV:  Next OV:  Last Prolia inj:  Next Prolia inj DUE:

## 2021-05-27 NOTE — Telephone Encounter (Signed)
Needs Evenity VOB (last done 03/2021).

## 2021-05-31 ENCOUNTER — Ambulatory Visit (HOSPITAL_BASED_OUTPATIENT_CLINIC_OR_DEPARTMENT_OTHER): Payer: Medicare Other | Admitting: Family

## 2021-06-01 ENCOUNTER — Other Ambulatory Visit: Payer: Self-pay

## 2021-06-01 ENCOUNTER — Ambulatory Visit (INDEPENDENT_AMBULATORY_CARE_PROVIDER_SITE_OTHER): Payer: Medicare Other | Admitting: Family

## 2021-06-01 ENCOUNTER — Encounter (HOSPITAL_BASED_OUTPATIENT_CLINIC_OR_DEPARTMENT_OTHER): Payer: Self-pay | Admitting: Family

## 2021-06-01 ENCOUNTER — Telehealth (HOSPITAL_BASED_OUTPATIENT_CLINIC_OR_DEPARTMENT_OTHER): Payer: Self-pay | Admitting: Cardiology

## 2021-06-01 VITALS — BP 140/82 | HR 107 | Ht 65.0 in | Wt 171.4 lb

## 2021-06-01 DIAGNOSIS — I10 Essential (primary) hypertension: Secondary | ICD-10-CM | POA: Diagnosis not present

## 2021-06-01 DIAGNOSIS — I4891 Unspecified atrial fibrillation: Secondary | ICD-10-CM | POA: Diagnosis not present

## 2021-06-01 DIAGNOSIS — M810 Age-related osteoporosis without current pathological fracture: Secondary | ICD-10-CM | POA: Diagnosis not present

## 2021-06-01 DIAGNOSIS — Z48815 Encounter for surgical aftercare following surgery on the digestive system: Secondary | ICD-10-CM | POA: Diagnosis not present

## 2021-06-01 DIAGNOSIS — D6869 Other thrombophilia: Secondary | ICD-10-CM

## 2021-06-01 DIAGNOSIS — R062 Wheezing: Secondary | ICD-10-CM

## 2021-06-01 DIAGNOSIS — R600 Localized edema: Secondary | ICD-10-CM | POA: Diagnosis not present

## 2021-06-01 DIAGNOSIS — I4819 Other persistent atrial fibrillation: Secondary | ICD-10-CM

## 2021-06-01 DIAGNOSIS — K5190 Ulcerative colitis, unspecified, without complications: Secondary | ICD-10-CM | POA: Diagnosis not present

## 2021-06-01 DIAGNOSIS — S32039D Unspecified fracture of third lumbar vertebra, subsequent encounter for fracture with routine healing: Secondary | ICD-10-CM | POA: Diagnosis not present

## 2021-06-01 MED ORDER — FUROSEMIDE 20 MG PO TABS: 20.0000 mg | ORAL_TABLET | Freq: Every day | ORAL | 5 refills | Status: DC

## 2021-06-01 NOTE — Patient Instructions (Addendum)
Medication Instructions:  Your physician has recommended you make the following change in your medication:   START Furosemide   Take two tablets (83m) daily for 1 day Then take 231mdaily  ______  If you need an allergy medication you can use over the counter Zyrtec or Claritin.  *If you need a refill on your cardiac medications before your next appointment, please call your pharmacy*   Lab Work: Your physician recommends that you return for lab work today: CMP, CBC, BNP  If you have labs (blood work) drawn today and your tests are completely normal, you will receive your results only by: MyChart Message (if you have MyChart) OR A paper copy in the mail If you have any lab test that is abnormal or we need to change your treatment, we will call you to review the results.   Testing/Procedures: None ordered today.  Follow-Up: [facility].  As part of our continuing mission to provide you with exceptional heart care, we have created designated Provider Care Teams.  These Care Teams include your primary Cardiologist (physician) and Advanced Practice Providers (APPs -  Physician Assistants and Nurse Practitioners) who all work together to provide you with the care you need, when you need it.  We recommend signing up for the patient portal called "MyChart".  Sign up information is provided on this After Visit Summary.  MyChart is used to connect with patients for Virtual Visits (Telemedicine).  Patients are able to view lab/test results, encounter notes, upcoming appointments, etc.  Non-urgent messages can be sent to your provider as well.   To learn more about what you can do with MyChart, go to [email or web address]   Your next appointment:   As scheduled   Other Instructions  Electrical Cardioversion Electrical cardioversion is the delivery of a jolt of electricity to restore a normal rhythm to the heart. A rhythm that is too  fast or is not regular keeps the heart from pumping well. In this procedure, sticky patches or metal paddles are placed on the chest to deliver electricity to the heart from a device. This procedure may be done in an emergency if: There is low or no blood pressure as a result of the heart rhythm. Normal rhythm must be restored as fast as possible to protect the brain and heart from further damage. It may save a life. This may also be a scheduled procedure for irregular or fast heart rhythms that are not immediately life-threatening. Tell a health care provider about: Any allergies you have. All medicines you are taking, including vitamins, herbs, eye drops, creams, and over-the-counter medicines. Any problems you or family members have had with anesthetic medicines. Any blood disorders you have. Any surgeries you have had. Any medical conditions you have. Whether you are pregnant or may be pregnant. What are the risks? Generally, this is a safe procedure. However, problems may occur, including: Allergic reactions to medicines. A blood clot that breaks free and travels to other parts of your body. The possible return of an abnormal heart rhythm within hours or days after the procedure. Your heart stopping (cardiac arrest). This is rare. What happens before the procedure? Medicines Your health care provider may have you start taking: Blood-thinning medicines (anticoagulants) so your blood does not clot as easily. Medicines to help stabilize your heart rate and rhythm. Ask your health care provider about: Changing or stopping your regular medicines. This is especially important if you  are taking diabetes medicines or blood thinners. Taking medicines such as aspirin and ibuprofen. These medicines can thin your blood. Do not take these medicines unless your health care provider tells you to take them. Taking over-the-counter medicines, vitamins, herbs, and supplements. General  instructions Follow instructions from your health care provider about eating or drinking restrictions. Plan to have someone take you home from the hospital or clinic. If you will be going home right after the procedure, plan to have someone with you for 24 hours. Ask your health care provider what steps will be taken to help prevent infection. These may include washing your skin with a germ-killing soap. What happens during the procedure?  An IV will be inserted into one of your veins. Sticky patches (electrodes) or metal paddles may be placed on your chest. You will be given a medicine to help you relax (sedative). An electrical shock will be delivered. The procedure may vary among health care providers and hospitals. What can I expect after the procedure? Your blood pressure, heart rate, breathing rate, and blood oxygen level will be monitored until you leave the hospital or clinic. Your heart rhythm will be watched to make sure it does not change. You may have some redness on the skin where the shocks were given. Follow these instructions at home: Do not drive for 24 hours if you were given a sedative during your procedure. Take over-the-counter and prescription medicines only as told by your health care provider. Ask your health care provider how to check your pulse. Check it often. Rest for 48 hours after the procedure or as told by your health care provider. Avoid or limit your caffeine use as told by your health care provider. Keep all follow-up visits as told by your health care provider. This is important. Contact a health care provider if: You feel like your heart is beating too quickly or your pulse is not regular. You have a serious muscle cramp that does not go away. Get help right away if: You have discomfort in your chest. You are dizzy or you feel faint. You have trouble breathing or you are short of breath. Your speech is slurred. You have trouble moving an arm or leg  on one side of your body. Your fingers or toes turn cold or blue. Summary Electrical cardioversion is the delivery of a jolt of electricity to restore a normal rhythm to the heart. This procedure may be done right away in an emergency or may be a scheduled procedure if the condition is not an emergency. Generally, this is a safe procedure. After the procedure, check your pulse often as told by your health care provider. This information is not intended to replace advice given to you by your health care provider. Make sure you discuss any questions you have with your health care provider. Document Revised: 03/04/2019 Document Reviewed: 03/04/2019 Elsevier Patient Education  [place].

## 2021-06-01 NOTE — Progress Notes (Signed)
Office Visit    Patient Name: Jasmine Mccormick Date of Encounter: 06/02/2021  PCP:  Cassandria Anger, MD   [place]  Cardiologist:  Buford Dresser, MD  Advanced Practice Provider:  No care team member to display Electrophysiologist:  None     Chief Complaint    Jasmine Mccormick is a 87 y.o. female with a hx of  ulcerative colitis s/p colectomy with ileostomy placement greater than 20 years ago, hypertension, hypothyroidism, distal SMA occlusion s/p thrombectomy, atrial fibrillation presents today for dyspnea  Past Medical History    Past Medical History:  Diagnosis Date   COVID 12/18/2020   mild   HTN (hypertension)    Normal at home   Hyperlipidemia    Hypothyroidism    Ileostomy in place [facility])    Memory loss    mild but not dx by doctor per Ne[facility]   Meniere disease 1996   Osteoporosis    UC (ulcerative colitis) ([place])    Past Surgical History:  Procedure Laterality Date   COLECTOMY     COLONOSCOPY     Ear Shunt for Menier's     ENDARTERECTOMY MESENTERIC Right 03/29/2021   Procedure: MESENTERIC ARTERY THROMBECTOMY;  Surgeon: Serafina Mitchell, MD;  Location: [place];  Service: Vascular;  Laterality: Right;   ILEOSTOMY     INTRAOPERATIVE ARTERIOGRAM Right 03/29/2021   Procedure: INTRA OPERATIVE AORTOGRAM;  Surgeon: Serafina Mitchell, MD;  Location: [place];  Service: Vascular;  Laterality: Right;   KYPHOPLASTY N/A 01/07/2021   Procedure: Lumbar three Kyphoplasty;  Surgeon: Dawley, Theodoro Doing, DO;  Location: [place];  Service: Neurosurgery;  Laterality: N/A;   PROCTECTOMY     ULTRASOUND GUIDANCE FOR VASCULAR ACCESS Right 03/29/2021   Procedure: ULTRASOUND GUIDANCE FOR VASCULAR ACCESS;  Surgeon: Serafina Mitchell, MD;  Location: [place];  Service: Vascular;  Laterality: Right;    Allergies  No Known Allergies  History of Present Illness    Jasmine Mccormick is a 87 y.o. female with a hx of ulcerative colitis s/p colectomy with ileostomy  placement greater than 20 years ago, hypertension, hypothyroidism, distal SMA occlusion s/p thrombectomy, atrial fibrillation last seen 05/17/21  She was admitted 03/28/2021 after presenting with abdominal pain.  CT scan showed occlusion of distal superior mesenteric artery with partial reconstitution of flow and moderate to marked severity diffuse calcification and atherosclerosis throughout remaining arterial structures within the abdomen and pelvis.  She underwent thrombectomy by Dr. Trula Slade.  She did have atrial fibrillation during admission and anticoagulation was started.  She was eventually transitioned to warfarin due to cost.  Verapamil was discontinued during admission due to bradycardia.  Echocardiogram during admission 03/30/2021 with LVEF 60 to 65%, RV normal size and function, normal PASP, bilateral atria moderately dilated, trivial MR, mild to moderate aortic valve sclerosis without stenosis.  She was seen in follow-up by Dr. Harrell Gave 04/15/2021 and was going to be home from rehab.  She did note some mild lower extremity edema.  She declined compression stockings and was courage to elevate her lower extremity.  She was asymptomatic in regards to her atrial fibrillation.  Her metoprolol was increased to 75 mg twice daily due to tachycardia.  INR readings: (goal 2-3) 04/22/21 2.1 ? 04/28/21 2.5 ? 05/05/21 2.9 ? 05/12/21 5.4 ? 05/19/21 3.1 ? 05/26/21 3.9  She was seen 05/17/21. Working with home health to increase stamina. LE edema was the same. Her rate was not well controlled with atrial  fibrillation 116 bpm and Toprol increased to 65m BID as she was hesitant regarding cardioversion. She was also started on Lasix 277mQD x 3 days due to lower extremity edema.   Contacted office today noting nonproductive cough since Sunday, shortness of breath, wheezing. She was scheduled for same day appointment. H[facility] contact stating BP 142/84 and abnormalities in the lungs.   Presents today for follow up  with her nephew. Tells me started a cough over the weekend and today noting wheezing and some shortness of breath. Her nephew tells me her swelling has increased over the last few days. Cough has been improving. It is non productive. No chest pain, pressure, tightness. No known sick contacts. Her weight is up 4 pounds compared to clinic visit 2 weeks ago.  EKGs/Labs/Other Studies Reviewed:   The following studies were reviewed today:  Echo 03/30/2021  1. Patient in rapid afib during study.   2. Left ventricular ejection fraction, by estimation, is 60 to 65%. The  left ventricle has normal function. The left ventricle has no regional  wall motion abnormalities. Left ventricular diastolic parameters are  indeterminate.   3. Right ventricular systolic function is normal. The right ventricular  size is normal. There is normal pulmonary artery systolic pressure.   4. Left atrial size was moderately dilated.   5. Right atrial size was moderately dilated.   6. The mitral valve is degenerative. Trivial mitral valve regurgitation.  No evidence of mitral stenosis. Moderate mitral annular calcification.   7. The aortic valve is tricuspid. Aortic valve regurgitation is not  visualized. Mild to moderate aortic valve sclerosis/calcification is  present, without any evidence of aortic stenosis.   8. The inferior vena cava is normal in size with greater than 50%  respiratory variability, suggesting right atrial pressure of 3 mmHg.   EKG:  EKG is  ordered today.  The ekg ordered today demonstrates atrial fibrillation 107 bpm.  Rightward axis. Occasional PVC. No acute St/T wave change.  Recent Labs: 03/17/2021: TSH 9.46 03/31/2021: ALT 15 06/01/2021: BNP 325.9; BUN 21; Creatinine, Ser 1.10; Hemoglobin 10.6; Platelets 331; Potassium 4.9; Sodium 137  Recent Lipid Panel    Component Value Date/Time   CHOL 122 04/01/2021 0410   TRIG 119 04/01/2021 0410   HDL 38 (L) 04/01/2021 0410   CHOLHDL 3.2 04/01/2021  0410   VLDL 24 04/01/2021 0410   LDLCALC 60 04/01/2021 0410    Risk Assessment/Calculations:   CHA2DS2-VASc Score = 4   This indicates a 4.8% annual risk of stroke. The patient's score is based upon: CHF History: 0 HTN History: 1 Diabetes History: 0 Stroke History: 0 Vascular Disease History: 0 Age Score: 2 Gender Score: 1   Home Medications   Current Meds  Medication Sig   calcium carbonate (TUMS - DOSED IN MG ELEMENTAL CALCIUM) 500 MG chewable tablet Chew 1 tablet by mouth as needed for indigestion or heartburn.   ezetimibe-simvastatin (VYTORIN) 10-20 MG tablet Take 1 tablet by mouth daily.   furosemide (LASIX) 20 MG tablet Take 1 tablet (20 mg total) by mouth daily.   levothyroxine (SYNTHROID) 75 MCG tablet Take 1 tablet (75 mcg total) by mouth daily.   metoprolol succinate (TOPROL-XL) 50 MG 24 hr tablet Take 1.5 tablets (75 mg total) by mouth 2 (two) times daily. Take with or immediately following a meal.   Multiple Vitamins-Minerals (MULTIVITAMIN,TX-MINERALS) tablet Take 1 tablet by mouth daily.   warfarin (COUMADIN) 3 MG tablet Take 1 tablet (3 mg total) by  mouth daily.     Review of Systems      All other systems reviewed and are otherwise negative except as noted above.  Physical Exam    VS:  BP 140/82   Pulse (!) 107   Ht 5' 5"  (1.651 m)   Wt 171 lb 6.4 oz (77.7 kg)   LMP  (LMP Unknown)   SpO2 97%   BMI 28.52 kg/m  , BMI Body mass index is 28.52 kg/m.  Wt Readings from Last 3 Encounters:  06/01/21 171 lb 6.4 oz (77.7 kg)  05/17/21 167 lb (75.8 kg)  05/03/21 165 lb (74.8 kg)    GEN: Well nourished, well developed, in no acute distress. HEENT: normal. Neck: Supple, no JVD, carotid bruits, or masses. Cardiac: irregularly irregular, no murmurs, rubs, or gallops. No clubbing, cyanosis. b.  Radials/PT 2+ and equal bilaterally.  Respiratory:  Respirations regular and unlabored. Wheezing in bilateral upper lobes. Lower lobes clear. GI: Soft, nontender,  nondistended. MS: No deformity or atrophy. Skin: Warm and dry, no rash. Neuro:  Strength and sensation are intact. Psych: Normal affect.  Assessment & Plan    Dyspnea / Cough / LE edema / Wheeze - Likely fluid retention in setting of atrial fibrillation. Management of atrial fib, as above. Start Lasix 45m tomorrow and 231mdaily thereafter. Recommend BMP, BNP, CBC today. Elevate lower extremities, low salt diet, compression socks encouraged. No sick contacts, fever so low suspicion for infectious etiology but CBC will confirm wbc count. Prompt follow up. If symptoms not improving at that time, will plan for CXR.   PAF / Chronic anticoagulation - EKG today still atrial fibrillation with uncontrolled rate despite increased dose Metoprolol. She has had therapeutic INR for 4 weeks and continues to follow with [facility]. Again discussed cardioversion and she will consider. Encouraged to monitor heart rate at home.  Acute mesenteric ischemia with obstruction of SMA -s/p thrombectomy 03/2021.  Continue to follow with vascular surgery. No recurrent abdominal pain.  HLD -most recent LDL 60.  Continue simvastatin and Zetia.  Hypothyroidism - Continue to follow with PCP.   Disposition: Follow up in 1 week as scheduled.  Signed, CaLoel DubonnetNP 06/02/2021, 6:37 PM [facility]

## 2021-06-01 NOTE — Telephone Encounter (Signed)
Lorell, [place] A - 06/01/2021 10:17 AM Loel Dubonnet, NP  Sent: Tue June 01, 2021  2:54 PM  To: Nuala Alpha, LPN          Message  Noted. Thank you!

## 2021-06-01 NOTE — Telephone Encounter (Signed)
Will forward this information to Laurann Montana NP as a general FYI, for she will be seeing the pt later in clinic today at 3:30 pm.

## 2021-06-01 NOTE — Telephone Encounter (Signed)
Spoke to patient's nephew Gordy Levan he stated patient has had a non productive cough since this past Sunday.Today she is sob and wheezing.Weight stable.Appointment scheduled with Laurann Montana NP this afternoon at 3:30 pm.

## 2021-06-01 NOTE — Telephone Encounter (Signed)
Paitent's newphew son called to say that Sunday she started off with just a cough, now she has a whizzing rattling sound when she breaths.

## 2021-06-01 NOTE — Telephone Encounter (Signed)
Diance with [employer] is calling stating the patient's BP is 142/84 and she heard abnormalities in her lungs as well. She was aware of the patient's appointment later this afternoon and wanted to make the office aware of this for her visit.

## 2021-06-02 ENCOUNTER — Telehealth (HOSPITAL_BASED_OUTPATIENT_CLINIC_OR_DEPARTMENT_OTHER): Payer: Self-pay | Admitting: Family

## 2021-06-02 ENCOUNTER — Ambulatory Visit (INDEPENDENT_AMBULATORY_CARE_PROVIDER_SITE_OTHER): Payer: Medicare Other | Admitting: Cardiology

## 2021-06-02 ENCOUNTER — Ambulatory Visit (INDEPENDENT_AMBULATORY_CARE_PROVIDER_SITE_OTHER): Payer: Medicare Other | Admitting: Podiatry

## 2021-06-02 DIAGNOSIS — S32039D Unspecified fracture of third lumbar vertebra, subsequent encounter for fracture with routine healing: Secondary | ICD-10-CM | POA: Diagnosis not present

## 2021-06-02 DIAGNOSIS — B351 Tinea unguium: Secondary | ICD-10-CM | POA: Diagnosis not present

## 2021-06-02 DIAGNOSIS — Z5181 Encounter for therapeutic drug level monitoring: Secondary | ICD-10-CM

## 2021-06-02 DIAGNOSIS — M79674 Pain in right toe(s): Secondary | ICD-10-CM

## 2021-06-02 DIAGNOSIS — M810 Age-related osteoporosis without current pathological fracture: Secondary | ICD-10-CM | POA: Diagnosis not present

## 2021-06-02 DIAGNOSIS — I10 Essential (primary) hypertension: Secondary | ICD-10-CM | POA: Diagnosis not present

## 2021-06-02 DIAGNOSIS — Z48815 Encounter for surgical aftercare following surgery on the digestive system: Secondary | ICD-10-CM | POA: Diagnosis not present

## 2021-06-02 DIAGNOSIS — M79675 Pain in left toe(s): Secondary | ICD-10-CM | POA: Diagnosis not present

## 2021-06-02 DIAGNOSIS — K5190 Ulcerative colitis, unspecified, without complications: Secondary | ICD-10-CM | POA: Diagnosis not present

## 2021-06-02 DIAGNOSIS — I4819 Other persistent atrial fibrillation: Secondary | ICD-10-CM

## 2021-06-02 LAB — CBC
Hematocrit: 34.6 % (ref 34.0–46.6)
Hemoglobin: 10.6 g/dL — ABNORMAL LOW (ref 11.1–15.9)
MCH: 26.4 pg — ABNORMAL LOW (ref 26.6–33.0)
MCHC: 30.6 g/dL — ABNORMAL LOW (ref 31.5–35.7)
MCV: 86 fL (ref 79–97)
Platelets: 331 10*3/uL (ref 150–450)
RBC: 4.01 x10E6/uL (ref 3.77–5.28)
RDW: 15.8 % — ABNORMAL HIGH (ref 11.7–15.4)
WBC: 7.8 10*3/uL (ref 3.4–10.8)

## 2021-06-02 LAB — BRAIN NATRIURETIC PEPTIDE: BNP: 325.9 pg/mL — ABNORMAL HIGH (ref 0.0–100.0)

## 2021-06-02 LAB — BASIC METABOLIC PANEL
BUN/Creatinine Ratio: 19 (ref 12–28)
BUN: 21 mg/dL (ref 8–27)
CO2: 16 mmol/L — ABNORMAL LOW (ref 20–29)
Calcium: 9.9 mg/dL (ref 8.7–10.3)
Chloride: 101 mmol/L (ref 96–106)
Creatinine, Ser: 1.1 mg/dL — ABNORMAL HIGH (ref 0.57–1.00)
Glucose: 92 mg/dL (ref 70–99)
Potassium: 4.9 mmol/L (ref 3.5–5.2)
Sodium: 137 mmol/L (ref 134–144)
eGFR: 49 mL/min/{1.73_m2} — ABNORMAL LOW (ref >59)

## 2021-06-02 LAB — POCT INR: INR: 3.2 — AB (ref 2.0–3.0)

## 2021-06-02 NOTE — Telephone Encounter (Signed)
Pt ready for scheduling on or after 06/02/21  Out-of-pocket cost due at time of visit: $25  Primary: Medicare [place] co-insurance: 20%  Admin fee co-insurance: 20%  Secondary: [employer] practitioner co-insurance: Covers Medicare deductible and co-insurance Admin fee co-insurance: Covers Medicare co-insurance and deductible  Deductible: [phone] of $233 met  Prior Auth: not required PA# Valid:    ** This summary of benefits is an estimation of the patient's out-of-pocket cost. Exact cost may vary based on individual plan coverage.

## 2021-06-02 NOTE — Telephone Encounter (Signed)
I spoke to the patient's niece Jackelyn Poling) regarding INR result/recommendation.  Verbalized understanding.

## 2021-06-02 NOTE — Telephone Encounter (Signed)
[facility] Nurse was calling to report the patient's INR. The home reading is 3.2

## 2021-06-07 NOTE — Progress Notes (Signed)
Office Visit    Patient Name: Jasmine Mccormick Date of Encounter: 06/08/2021  PCP:  Cassandria Anger, MD   [place]  Cardiologist:  Buford Dresser, MD  Advanced Practice Provider:  No care team member to display Electrophysiologist:  None     Chief Complaint    Jasmine Mccormick is a 87 y.o. female with a hx of  ulcerative colitis s/p colectomy with ileostomy placement greater than 20 years ago, hypertension, hypothyroidism, distal SMA occlusion s/p thrombectomy, atrial fibrillation presents today for follow up after addition of Lasix.  Past Medical History    Past Medical History:  Diagnosis Date   COVID 12/18/2020   mild   HTN (hypertension)    Normal at home   Hyperlipidemia    Hypothyroidism    Ileostomy in place [facility])    Memory loss    mild but not dx by doctor per Neice   Meniere disease 1996   Osteoporosis    UC (ulcerative colitis) ([place])    Past Surgical History:  Procedure Laterality Date   COLECTOMY     COLONOSCOPY     Ear Shunt for Menier's     ENDARTERECTOMY MESENTERIC Right 03/29/2021   Procedure: MESENTERIC ARTERY THROMBECTOMY;  Surgeon: Serafina Mitchell, MD;  Location: [place];  Service: Vascular;  Laterality: Right;   ILEOSTOMY     INTRAOPERATIVE ARTERIOGRAM Right 03/29/2021   Procedure: INTRA OPERATIVE AORTOGRAM;  Surgeon: Serafina Mitchell, MD;  Location: [place];  Service: Vascular;  Laterality: Right;   KYPHOPLASTY N/A 01/07/2021   Procedure: Lumbar three Kyphoplasty;  Surgeon: Dawley, Theodoro Doing, DO;  Location: [place];  Service: Neurosurgery;  Laterality: N/A;   PROCTECTOMY     ULTRASOUND GUIDANCE FOR VASCULAR ACCESS Right 03/29/2021   Procedure: ULTRASOUND GUIDANCE FOR VASCULAR ACCESS;  Surgeon: Serafina Mitchell, MD;  Location: [place];  Service: Vascular;  Laterality: Right;    Allergies  No Known Allergies  History of Present Illness    Jasmine Mccormick is a 87 y.o. female with a hx of ulcerative colitis  s/p colectomy with ileostomy placement greater than 20 years ago, hypertension, hypothyroidism, distal SMA occlusion s/p thrombectomy, atrial fibrillation last seen 06/01/21  She was admitted 03/28/2021 after presenting with abdominal pain.  CT scan showed occlusion of distal superior mesenteric artery with partial reconstitution of flow and moderate to marked severity diffuse calcification and atherosclerosis throughout remaining arterial structures within the abdomen and pelvis.  She underwent thrombectomy by Dr. Trula Slade.  She did have atrial fibrillation during admission and anticoagulation was started.  She was eventually transitioned to warfarin due to cost.  Verapamil was discontinued during admission due to bradycardia.  Echocardiogram during admission 03/30/2021 with LVEF 60 to 65%, RV normal size and function, normal PASP, bilateral atria moderately dilated, trivial MR, mild to moderate aortic valve sclerosis without stenosis.  She was seen in follow-up by Dr. Harrell Gave 04/15/2021 and was going to be home from rehab.  She did note some mild lower extremity edema.  She declined compression stockings and was courage to elevate her lower extremity.  She was asymptomatic in regards to her atrial fibrillation.  Her metoprolol was increased to 75 mg twice daily due to tachycardia.  INR readings: (goal 2-3) 04/22/21 2.1 ? 04/28/21 2.5 ? 05/05/21 2.9 ? 05/12/21 5.4 ? 05/19/21 3.1 ? 05/26/21 3.9  She was seen 05/17/21. Working with home health to increase stamina. LE edema was the same. Her rate was  not well controlled with atrial fibrillation 116 bpm and Toprol increased to 77m BID as she was hesitant regarding cardioversion. She was also started on Lasix 261mQD x 3 days due to lower extremity edema.   Contacted office today noting nonproductive cough since Sunday, shortness of breath, wheezing. She was scheduled for same day appointment. [facility] contact stating BP 142/84 and abnormalities in the lungs.    Was seen 06/01/21 in follow up with her nephew. She had cough over the weekend and on date of visit wheezing and shortness of breath. Weight was up 4 lbs. Fluid retention thought to be in setting of atrial fib and Lasix was initiated.   She presents today for follow up with her niece. Reports no shortness of breath at rest and stable dyspnea on exertion. Reports no chest pain, pressure, or tightness. No orthopnea, PND. Reports no palpitations. Does endorse feeling fatigued. We discussed that this can be due to atrial fibrillation. Her previous wheeze and cough have resolved with the addition of Lasix.   EKGs/Labs/Other Studies Reviewed:   The following studies were reviewed today:  Echo 03/30/2021  1. Patient in rapid afib during study.   2. Left ventricular ejection fraction, by estimation, is 60 to 65%. The  left ventricle has normal function. The left ventricle has no regional  wall motion abnormalities. Left ventricular diastolic parameters are  indeterminate.   3. Right ventricular systolic function is normal. The right ventricular  size is normal. There is normal pulmonary artery systolic pressure.   4. Left atrial size was moderately dilated.   5. Right atrial size was moderately dilated.   6. The mitral valve is degenerative. Trivial mitral valve regurgitation.  No evidence of mitral stenosis. Moderate mitral annular calcification.   7. The aortic valve is tricuspid. Aortic valve regurgitation is not  visualized. Mild to moderate aortic valve sclerosis/calcification is  present, without any evidence of aortic stenosis.   8. The inferior vena cava is normal in size with greater than 50%  respiratory variability, suggesting right atrial pressure of 3 mmHg.   EKG:  EKG is  ordered today.  The ekg ordered today demonstrates atrial fibrillation 114 bpm with rightward axis and no acute ST/T wave changes.   Recent Labs: 03/17/2021: TSH 9.46 03/31/2021: ALT 15 06/01/2021: BNP 325.9;  BUN 21; Creatinine, Ser 1.10; Hemoglobin 10.6; Platelets 331; Potassium 4.9; Sodium 137  Recent Lipid Panel    Component Value Date/Time   CHOL 122 04/01/2021 0410   TRIG 119 04/01/2021 0410   HDL 38 (L) 04/01/2021 0410   CHOLHDL 3.2 04/01/2021 0410   VLDL 24 04/01/2021 0410   LDLCALC 60 04/01/2021 0410    Risk Assessment/Calculations:   CHA2DS2-VASc Score = 4   This indicates a 4.8% annual risk of stroke. The patient's score is based upon: CHF History: 0 HTN History: 1 Diabetes History: 0 Stroke History: 0 Vascular Disease History: 0 Age Score: 2 Gender Score: 1   Home Medications   Current Meds  Medication Sig   calcium carbonate (TUMS - DOSED IN MG ELEMENTAL CALCIUM) 500 MG chewable tablet Chew 1 tablet by mouth as needed for indigestion or heartburn.   ezetimibe-simvastatin (VYTORIN) 10-20 MG tablet Take 1 tablet by mouth daily.   furosemide (LASIX) 20 MG tablet Take 1 tablet (20 mg total) by mouth daily.   levothyroxine (SYNTHROID) 75 MCG tablet Take 1 tablet (75 mcg total) by mouth daily.   metoprolol succinate (TOPROL-XL) 50 MG 24 hr tablet Take  1.5 tablets (75 mg total) by mouth 2 (two) times daily. Take with or immediately following a meal.   Multiple Vitamins-Minerals (MULTIVITAMIN,TX-MINERALS) tablet Take 1 tablet by mouth daily.   warfarin (COUMADIN) 3 MG tablet Take 1 tablet (3 mg total) by mouth daily.     Review of Systems      All other systems reviewed and are otherwise negative except as noted above.  Physical Exam    VS:  BP 118/68   Ht 5' 5"  (1.651 m)   Wt 164 lb (74.4 kg)   LMP  (LMP Unknown)   SpO2 97%   BMI 27.29 kg/m  , BMI Body mass index is 27.29 kg/m.  Wt Readings from Last 3 Encounters:  06/08/21 164 lb (74.4 kg)  06/01/21 171 lb 6.4 oz (77.7 kg)  05/17/21 167 lb (75.8 kg)    GEN: Well nourished, well developed, in no acute distress. HEENT: normal. Neck: Supple, no JVD, carotid bruits, or masses. Cardiac: irregularly irregular,  no murmurs, rubs, or gallops. No clubbing, cyanosis. Radials/PT 2+ and equal bilaterally. Trace pedal edema bilaterally. Respiratory:  Respirations regular and unlabored. GI: Soft, nontender, nondistended. MS: No deformity or atrophy. Skin: Warm and dry, no rash. Neuro:  Strength and sensation are intact. Psych: Normal affect.  Assessment & Plan    Dyspnea / Cough / LE edema / Wheeze - Wheeze and cough resolved, lung sounds clear. Improved with addition of Lasix. Weight down 7 lbs. Continue Lasix 55m QD. Elevate lower extremities, low salt diet, compression socks encouraged.   PAF / Chronic anticoagulation - EKG today still atrial fibrillation with uncontrolled rate. She has had therapeutic INR for 4 weeks and continues to follow with [facility]. Plan for cardioversion. CBC, BMP today.   Shared Decision Making/Informed Consent The risks (stroke, cardiac arrhythmias rarely resulting in the need for a temporary or permanent pacemaker, skin irritation or burns and complications associated with conscious sedation including aspiration, arrhythmia, respiratory failure and death), benefits (restoration of normal sinus rhythm) and alternatives of a direct current cardioversion were explained in detail to Ms. Bury and she agrees to proceed.    Acute mesenteric ischemia with obstruction of SMA -s/p thrombectomy 03/2021.  Continue to follow with vascular surgery. No recurrent abdominal pain.  HLD -most recent LDL 60.  Continue simvastatin and Zetia.  Hypothyroidism - Continue to follow with PCP.   Disposition: Follow up 2 weeks after cardioversion with Dr. CHarrell Gaveor APP  Signed, CLoel Dubonnet NP 06/08/2021, 10:29 AM [place]

## 2021-06-07 NOTE — H&P (View-Only) (Signed)
Office Visit    Patient Name: Jasmine Mccormick Date of Encounter: 06/08/2021  PCP:  Cassandria Anger, MD   [place]  Cardiologist:  Buford Dresser, MD  Advanced Practice Provider:  No care team member to display Electrophysiologist:  None     Chief Complaint    Jasmine Mccormick is a 87 y.o. female with a hx of  ulcerative colitis s/p colectomy with ileostomy placement greater than 20 years ago, hypertension, hypothyroidism, distal SMA occlusion s/p thrombectomy, atrial fibrillation presents today for follow up after addition of Lasix.  Past Medical History    Past Medical History:  Diagnosis Date   COVID 12/18/2020   mild   HTN (hypertension)    Normal at home   Hyperlipidemia    Hypothyroidism    Ileostomy in place [facility])    Memory loss    mild but not dx by doctor per [place]   Meniere disease 1996   Osteoporosis    UC (ulcerative colitis) ([place])    Past Surgical History:  Procedure Laterality Date   COLECTOMY     COLONOSCOPY     Ear Shunt for Menier's     ENDARTERECTOMY MESENTERIC Right 03/29/2021   Procedure: MESENTERIC ARTERY THROMBECTOMY;  Surgeon: Serafina Mitchell, MD;  Location: [place];  Service: Vascular;  Laterality: Right;   ILEOSTOMY     INTRAOPERATIVE ARTERIOGRAM Right 03/29/2021   Procedure: INTRA OPERATIVE AORTOGRAM;  Surgeon: Serafina Mitchell, MD;  Location: [place];  Service: Vascular;  Laterality: Right;   KYPHOPLASTY N/A 01/07/2021   Procedure: Lumbar three Kyphoplasty;  Surgeon: Dawley, Theodoro Doing, DO;  Location: [place];  Service: Neurosurgery;  Laterality: N/A;   PROCTECTOMY     ULTRASOUND GUIDANCE FOR VASCULAR ACCESS Right 03/29/2021   Procedure: ULTRASOUND GUIDANCE FOR VASCULAR ACCESS;  Surgeon: Serafina Mitchell, MD;  Location: [place];  Service: Vascular;  Laterality: Right;    Allergies  No Known Allergies  History of Present Illness    Jasmine Mccormick is a 87 y.o. female with a hx of ulcerative colitis  s/p colectomy with ileostomy placement greater than 20 years ago, hypertension, hypothyroidism, distal SMA occlusion s/p thrombectomy, atrial fibrillation last seen 06/01/21  She was admitted 03/28/2021 after presenting with abdominal pain.  CT scan showed occlusion of distal superior mesenteric artery with partial reconstitution of flow and moderate to marked severity diffuse calcification and atherosclerosis throughout remaining arterial structures within the abdomen and pelvis.  She underwent thrombectomy by Dr. Trula Slade.  She did have atrial fibrillation during admission and anticoagulation was started.  She was eventually transitioned to warfarin due to cost.  Verapamil was discontinued during admission due to bradycardia.  Echocardiogram during admission 03/30/2021 with LVEF 60 to 65%, RV normal size and function, normal PASP, bilateral atria moderately dilated, trivial MR, mild to moderate aortic valve sclerosis without stenosis.  She was seen in follow-up by Dr. Harrell Gave 04/15/2021 and was going to be home from rehab.  She did note some mild lower extremity edema.  She declined compression stockings and was courage to elevate her lower extremity.  She was asymptomatic in regards to her atrial fibrillation.  Her metoprolol was increased to 75 mg twice daily due to tachycardia.  INR readings: (goal 2-3) 04/22/21 2.1 ? 04/28/21 2.5 ? 05/05/21 2.9 ? 05/12/21 5.4 ? 05/19/21 3.1 ? 05/26/21 3.9  She was seen 05/17/21. Working with home health to increase stamina. LE edema was the same. Her rate was  not well controlled with atrial fibrillation 116 bpm and Toprol increased to 74m BID as she was hesitant regarding cardioversion. She was also started on Lasix 285mQD x 3 days due to lower extremity edema.   Contacted office today noting nonproductive cough since Sunday, shortness of breath, wheezing. She was scheduled for same day appointment. [facility] contact stating BP 142/84 and abnormalities in the lungs.    Was seen 06/01/21 in follow up with her nephew. She had cough over the weekend and on date of visit wheezing and shortness of breath. Weight was up 4 lbs. Fluid retention thought to be in setting of atrial fib and Lasix was initiated.   She presents today for follow up with her niece. Reports no shortness of breath at rest and stable dyspnea on exertion. Reports no chest pain, pressure, or tightness. No orthopnea, PND. Reports no palpitations. Does endorse feeling fatigued. We discussed that this can be due to atrial fibrillation. Her previous wheeze and cough have resolved with the addition of Lasix.   EKGs/Labs/Other Studies Reviewed:   The following studies were reviewed today:  Echo 03/30/2021  1. Patient in rapid afib during study.   2. Left ventricular ejection fraction, by estimation, is 60 to 65%. The  left ventricle has normal function. The left ventricle has no regional  wall motion abnormalities. Left ventricular diastolic parameters are  indeterminate.   3. Right ventricular systolic function is normal. The right ventricular  size is normal. There is normal pulmonary artery systolic pressure.   4. Left atrial size was moderately dilated.   5. Right atrial size was moderately dilated.   6. The mitral valve is degenerative. Trivial mitral valve regurgitation.  No evidence of mitral stenosis. Moderate mitral annular calcification.   7. The aortic valve is tricuspid. Aortic valve regurgitation is not  visualized. Mild to moderate aortic valve sclerosis/calcification is  present, without any evidence of aortic stenosis.   8. The inferior vena cava is normal in size with greater than 50%  respiratory variability, suggesting right atrial pressure of 3 mmHg.   EKG:  EKG is  ordered today.  The ekg ordered today demonstrates atrial fibrillation 114 bpm with rightward axis and no acute ST/T wave changes.   Recent Labs: 03/17/2021: TSH 9.46 03/31/2021: ALT 15 06/01/2021: BNP 325.9;  BUN 21; Creatinine, Ser 1.10; Hemoglobin 10.6; Platelets 331; Potassium 4.9; Sodium 137  Recent Lipid Panel    Component Value Date/Time   CHOL 122 04/01/2021 0410   TRIG 119 04/01/2021 0410   HDL 38 (L) 04/01/2021 0410   CHOLHDL 3.2 04/01/2021 0410   VLDL 24 04/01/2021 0410   LDLCALC 60 04/01/2021 0410    Risk Assessment/Calculations:   CHA2DS2-VASc Score = 4   This indicates a 4.8% annual risk of stroke. The patient's score is based upon: CHF History: 0 HTN History: 1 Diabetes History: 0 Stroke History: 0 Vascular Disease History: 0 Age Score: 2 Gender Score: 1   Home Medications   Current Meds  Medication Sig   calcium carbonate (TUMS - DOSED IN MG ELEMENTAL CALCIUM) 500 MG chewable tablet Chew 1 tablet by mouth as needed for indigestion or heartburn.   ezetimibe-simvastatin (VYTORIN) 10-20 MG tablet Take 1 tablet by mouth daily.   furosemide (LASIX) 20 MG tablet Take 1 tablet (20 mg total) by mouth daily.   levothyroxine (SYNTHROID) 75 MCG tablet Take 1 tablet (75 mcg total) by mouth daily.   metoprolol succinate (TOPROL-XL) 50 MG 24 hr tablet Take  1.5 tablets (75 mg total) by mouth 2 (two) times daily. Take with or immediately following a meal.   Multiple Vitamins-Minerals (MULTIVITAMIN,TX-MINERALS) tablet Take 1 tablet by mouth daily.   warfarin (COUMADIN) 3 MG tablet Take 1 tablet (3 mg total) by mouth daily.     Review of Systems      All other systems reviewed and are otherwise negative except as noted above.  Physical Exam    VS:  BP 118/68   Ht 5' 5"  (1.651 m)   Wt 164 lb (74.4 kg)   LMP  (LMP Unknown)   SpO2 97%   BMI 27.29 kg/m  , BMI Body mass index is 27.29 kg/m.  Wt Readings from Last 3 Encounters:  06/08/21 164 lb (74.4 kg)  06/01/21 171 lb 6.4 oz (77.7 kg)  05/17/21 167 lb (75.8 kg)    GEN: Well nourished, well developed, in no acute distress. HEENT: normal. Neck: Supple, no JVD, carotid bruits, or masses. Cardiac: irregularly irregular,  no murmurs, rubs, or gallops. No clubbing, cyanosis. Radials/PT 2+ and equal bilaterally. Trace pedal edema bilaterally. Respiratory:  Respirations regular and unlabored. GI: Soft, nontender, nondistended. MS: No deformity or atrophy. Skin: Warm and dry, no rash. Neuro:  Strength and sensation are intact. Psych: Normal affect.  Assessment & Plan    Dyspnea / Cough / LE edema / Wheeze - Wheeze and cough resolved, lung sounds clear. Improved with addition of Lasix. Weight down 7 lbs. Continue Lasix 92m QD. Elevate lower extremities, low salt diet, compression socks encouraged.   PAF / Chronic anticoagulation - EKG today still atrial fibrillation with uncontrolled rate. She has had therapeutic INR for 4 weeks and continues to follow with [facility]. Plan for cardioversion. CBC, BMP today.   Shared Decision Making/Informed Consent The risks (stroke, cardiac arrhythmias rarely resulting in the need for a temporary or permanent pacemaker, skin irritation or burns and complications associated with conscious sedation including aspiration, arrhythmia, respiratory failure and death), benefits (restoration of normal sinus rhythm) and alternatives of a direct current cardioversion were explained in detail to Ms. Cisek and she agrees to proceed.    Acute mesenteric ischemia with obstruction of SMA -s/p thrombectomy 03/2021.  Continue to follow with vascular surgery. No recurrent abdominal pain.  HLD -most recent LDL 60.  Continue simvastatin and Zetia.  Hypothyroidism - Continue to follow with PCP.   Disposition: Follow up 2 weeks after cardioversion with Dr. CHarrell Gaveor APP  Signed, CLoel Dubonnet NP 06/08/2021, 10:29 AM [place]

## 2021-06-08 ENCOUNTER — Encounter (HOSPITAL_BASED_OUTPATIENT_CLINIC_OR_DEPARTMENT_OTHER): Payer: Self-pay | Admitting: Family

## 2021-06-08 ENCOUNTER — Ambulatory Visit (HOSPITAL_BASED_OUTPATIENT_CLINIC_OR_DEPARTMENT_OTHER): Payer: Medicare Other | Admitting: Cardiology

## 2021-06-08 ENCOUNTER — Encounter: Payer: Self-pay | Admitting: Podiatry

## 2021-06-08 ENCOUNTER — Other Ambulatory Visit: Payer: Self-pay

## 2021-06-08 ENCOUNTER — Ambulatory Visit (INDEPENDENT_AMBULATORY_CARE_PROVIDER_SITE_OTHER): Payer: Medicare Other | Admitting: Family

## 2021-06-08 ENCOUNTER — Encounter (HOSPITAL_COMMUNITY): Payer: Self-pay | Admitting: Internal Medicine

## 2021-06-08 VITALS — BP 118/68 | Ht 65.0 in | Wt 164.0 lb

## 2021-06-08 DIAGNOSIS — I480 Paroxysmal atrial fibrillation: Secondary | ICD-10-CM | POA: Diagnosis not present

## 2021-06-08 DIAGNOSIS — I4891 Unspecified atrial fibrillation: Secondary | ICD-10-CM | POA: Diagnosis not present

## 2021-06-08 DIAGNOSIS — Z48815 Encounter for surgical aftercare following surgery on the digestive system: Secondary | ICD-10-CM | POA: Diagnosis not present

## 2021-06-08 DIAGNOSIS — D6869 Other thrombophilia: Secondary | ICD-10-CM | POA: Diagnosis not present

## 2021-06-08 DIAGNOSIS — I4819 Other persistent atrial fibrillation: Secondary | ICD-10-CM | POA: Diagnosis not present

## 2021-06-08 DIAGNOSIS — R0609 Other forms of dyspnea: Secondary | ICD-10-CM

## 2021-06-08 DIAGNOSIS — E782 Mixed hyperlipidemia: Secondary | ICD-10-CM

## 2021-06-08 DIAGNOSIS — S32039D Unspecified fracture of third lumbar vertebra, subsequent encounter for fracture with routine healing: Secondary | ICD-10-CM | POA: Diagnosis not present

## 2021-06-08 DIAGNOSIS — K5190 Ulcerative colitis, unspecified, without complications: Secondary | ICD-10-CM | POA: Diagnosis not present

## 2021-06-08 DIAGNOSIS — I10 Essential (primary) hypertension: Secondary | ICD-10-CM | POA: Diagnosis not present

## 2021-06-08 DIAGNOSIS — M810 Age-related osteoporosis without current pathological fracture: Secondary | ICD-10-CM | POA: Diagnosis not present

## 2021-06-08 LAB — BASIC METABOLIC PANEL
BUN/Creatinine Ratio: 16 (ref 12–28)
BUN: 15 mg/dL (ref 8–27)
CO2: 26 mmol/L (ref 20–29)
Calcium: 10.1 mg/dL (ref 8.7–10.3)
Chloride: 94 mmol/L — ABNORMAL LOW (ref 96–106)
Creatinine, Ser: 0.95 mg/dL (ref 0.57–1.00)
Glucose: 88 mg/dL (ref 70–99)
Potassium: 3.7 mmol/L (ref 3.5–5.2)
Sodium: 134 mmol/L (ref 134–144)
eGFR: 59 mL/min/{1.73_m2} — ABNORMAL LOW (ref >59)

## 2021-06-08 LAB — CBC
Hematocrit: 33.9 % — ABNORMAL LOW (ref 34.0–46.6)
Hemoglobin: 10.6 g/dL — ABNORMAL LOW (ref 11.1–15.9)
MCH: 25.9 pg — ABNORMAL LOW (ref 26.6–33.0)
MCHC: 31.3 g/dL — ABNORMAL LOW (ref 31.5–35.7)
MCV: 83 fL (ref 79–97)
Platelets: 355 10*3/uL (ref 150–450)
RBC: 4.09 x10E6/uL (ref 3.77–5.28)
RDW: 16.6 % — ABNORMAL HIGH (ref 11.7–15.4)
WBC: 8.5 10*3/uL (ref 3.4–10.8)

## 2021-06-08 NOTE — Progress Notes (Signed)
  Subjective:  Patient ID: Jasmine Mccormick, female    DOB: 06/20/1934,  MRN: [id number]  STEPHENIA Mccormick presents to clinic today for thick, elongated toenails b/l lower extremities which are tender when wearing enclosed shoe gear.  Patient has had matrixectomy performed right great toe medial border by Dr. Posey Pronto in August. She states border is still sore, but attributes it to the length of the toenail. Denies any redness, drainage or swelling.  PCP is Plotnikov, Evie Lacks, MD , and last visit was 03/17/2021.  No Known Allergies  Review of Systems: Negative except as noted in the HPI. Objective:   Constitutional Jasmine Mccormick is a pleasant 87 y.o. Caucasian female, WD, WN in NAD. AAO x 3.   Vascular Neurovascular status unchanged b/l lower extremities. Capillary refill time to digits immediate b/l lower extremities. Palpable DP pulse(s) b/l lower extremities Palpable PT pulse(s) b/l lower extremities Pedal hair sparse. Lower extremity skin temperature gradient within normal limits. No pain with calf compression b/l. No edema noted b/l lower extremities. No cyanosis or clubbing noted.  Neurologic Normal speech. Oriented to person, place, and time. Protective sensation intact 5/5 intact bilaterally with 10g monofilament b/l.  Dermatologic Pedal skin is warm and supple b/l LE. No open wounds b/l LE. No interdigital macerations b/l lower extremities. Toenails 1-5 b/l elongated, discolored, dystrophic, thickened, crumbly with subungual debris and tenderness to dorsal palpation. Procedure site of R hallux noted to be completely healed with no erythema, no edema, no drainage, no purulence.  She does have pain at distal medial corner where elongated toenail is causing pressure. There is no residual ingrown toenail.  Orthopedic: Normal muscle strength 5/5 to all lower extremity muscle groups bilaterally. No gross bony deformities b/l lower extremities.   Radiographs: None Assessment:   1.  Pain due to onychomycosis of toenails of both feet    Plan:  Patient was evaluated and treated and all questions answered. Consent given for treatment as described below: -No new findings. No new orders. -Mycotic toenails were debrided in length and girth with sterile nail nippers and dremel without iatrogenic bleeding. -Patient to report any pedal injuries to medical professional immediately. -Patient/POA to call should there be question/concern in the interim.  Return in about 3 months (around 09/02/2021).  Marzetta Board, DPM

## 2021-06-08 NOTE — Patient Instructions (Addendum)
Medication Instructions:  Continue current medications.   *If you need a refill on your cardiac medications before your next appointment, please call your pharmacy*  Lab Work: Your physician recommends that you return for lab work today: CBC, BMP  If you have labs (blood work) drawn today and your tests are completely normal, you will receive your results only by: [place] (if you have [place]) OR A paper copy in the mail If you have any lab test that is abnormal or we need to change your treatment, we will call you to review the results.  Testing/Procedures: Your EKG today showed atrial fibrillation. We will plan for cardioversion.    Follow-Up: At [facility], you and your health needs are our priority.  As part of our continuing mission to provide you with exceptional heart care, we have created designated Provider Care Teams.  These Care Teams include your primary Cardiologist (physician) and Advanced Practice Providers (APPs -  Physician Assistants and Nurse Practitioners) who all work together to provide you with the care you need, when you need it.  We recommend signing up for the patient portal called "MyChart".  Sign up information is provided on this After Visit Summary.  MyChart is used to connect with patients for Virtual Visits (Telemedicine).  Patients are able to view lab/test results, encounter notes, upcoming appointments, etc.  Non-urgent messages can be sent to your provider as well.   To learn more about what you can do with MyChart, go to [email or web address].    Your next appointment:   2 weeks after cardioversion with Dr. Harrell Gave or APP   Other Instructions   You are scheduled for a Cardioversion on Tuesday, November 1st with Dr. Shirlee More.  Please arrive at the [facility] (Main Entrance A) at [facility]: [address] [place], [place] [address] at 6:30 am.   DIET: Nothing to eat or drink after midnight except a sip of water with  medications (see medication instructions below)  FYI: For your safety, and to allow [name] to monitor your vital signs accurately during the surgery/procedure we request that   if you have artificial nails, gel coating, SNS etc. Please have those removed prior to your surgery/procedure. Not having the nail coverings /polish removed may result in cancellation or delay of your surgery/procedure.   Medication Instructions: Hold Furosemide (Lasix) that morning  Continue your anticoagulant: Warfarin  Labs: Come to [facility] lab today for BMP, CBC  You must have a responsible person to drive you home and stay in the waiting area during your procedure. Failure to do so could result in cancellation.  Bring your insurance cards.  *Special Note: Every effort is made to have your procedure done on time. Occasionally there are emergencies that occur at the hospital that may cause delays. Please be patient if a delay does occur.

## 2021-06-09 DIAGNOSIS — I4819 Other persistent atrial fibrillation: Secondary | ICD-10-CM | POA: Diagnosis not present

## 2021-06-09 DIAGNOSIS — I10 Essential (primary) hypertension: Secondary | ICD-10-CM | POA: Diagnosis not present

## 2021-06-09 DIAGNOSIS — S32039D Unspecified fracture of third lumbar vertebra, subsequent encounter for fracture with routine healing: Secondary | ICD-10-CM | POA: Diagnosis not present

## 2021-06-09 DIAGNOSIS — M810 Age-related osteoporosis without current pathological fracture: Secondary | ICD-10-CM | POA: Diagnosis not present

## 2021-06-09 DIAGNOSIS — K5190 Ulcerative colitis, unspecified, without complications: Secondary | ICD-10-CM | POA: Diagnosis not present

## 2021-06-09 DIAGNOSIS — Z48815 Encounter for surgical aftercare following surgery on the digestive system: Secondary | ICD-10-CM | POA: Diagnosis not present

## 2021-06-09 LAB — POCT INR: INR: 3.8 — AB (ref 2.0–3.0)

## 2021-06-10 ENCOUNTER — Ambulatory Visit (INDEPENDENT_AMBULATORY_CARE_PROVIDER_SITE_OTHER): Payer: Medicare Other | Admitting: Cardiology

## 2021-06-10 ENCOUNTER — Telehealth: Payer: Self-pay

## 2021-06-10 DIAGNOSIS — Z5181 Encounter for therapeutic drug level monitoring: Secondary | ICD-10-CM

## 2021-06-10 NOTE — Telephone Encounter (Signed)
Left patient's niece message to call and schedule INR appointment.

## 2021-06-14 NOTE — Anesthesia Preprocedure Evaluation (Addendum)
Anesthesia Evaluation  Patient identified by MRN, date of birth, ID band Patient awake    Reviewed: Allergy & Precautions, NPO status , Patient's Chart, lab work & pertinent test results  History of Anesthesia Complications Negative for: history of anesthetic complications  Airway Mallampati: III  TM Distance: <3 FB Neck ROM: Full    Dental  (+) Dental Advisory Given, Teeth Intact   Pulmonary neg pulmonary ROS, neg shortness of breath, neg sleep apnea, neg COPD, neg recent URI,                          breath sounds clear to auscultation       Cardiovascular hypertension, Pt. on medications (-) angina(-) Past MI and (-) CHF negative cardio ROS   Rhythm:Irregular  Left Ventricle: Left ventricular ejection fraction, by estimation, is 60  to 65%. The left ventricle has normal function. The left ventricle has no  regional wall motion abnormalities. The left ventricular internal cavity  size was normal in size. There is  no left ventricular hypertrophy. Left ventricular diastolic parameters  are indeterminate.    Neuro/Psych  Neuromuscular disease negative neurological ROS  negative psych ROS   GI/Hepatic negative GI ROS, Neg liver ROS, PUD, Mesenteric Artery Blockage UC   Endo/Other  negative endocrine ROSHypothyroidism   Renal/GU negative Renal ROS  negative genitourinary   Musculoskeletal negative musculoskeletal ROS (+)   Abdominal ileostomy  Peds negative pediatric ROS (+)  Hematology negative hematology ROS (+) Blood dyscrasia, anemia ,   Anesthesia Other Findings   Reproductive/Obstetrics negative OB ROS                           Anesthesia Physical  Anesthesia Plan  ASA: 4  Anesthesia Plan: General   Post-op Pain Management:    Induction: Intravenous  PONV Risk Score and Plan: 3 and Ondansetron and Dexamethasone  Airway Management Planned: Natural Airway, Nasal  Cannula and Simple Face Mask  Additional Equipment:   Intra-op Plan:   Post-operative Plan:   Informed Consent: I have reviewed the patients History and Physical, chart, labs and discussed the procedure including the risks, benefits and alternatives for the proposed anesthesia with the patient or authorized representative who has indicated his/her understanding and acceptance.     Dental advisory given  Plan Discussed with: CRNA and Anesthesiologist  Anesthesia Plan Comments: Jasmine Mccormick is a 87 y.o. female with a hx of ulcerative colitis s/p colectomy with ileostomy placement greater than 20 years ago, hypertension, hypothyroidism, distal SMA occlusion s/p thrombectomy, atrial fibrillation last seen 06/01/21  She was admitted 03/28/2021 after presenting with abdominal pain.  CT scan showed occlusion of distal superior mesenteric artery with partial reconstitution of flow and moderate to marked severity diffuse calcification and atherosclerosis throughout remaining arterial structures within the abdomen and pelvis.  She underwent thrombectomy by Dr. Trula Slade.  She did have atrial fibrillation during admission and anticoagulation was started.  She was eventually transitioned to warfarin due to cost.  Verapamil was discontinued during admission due to bradycardia.  Echocardiogram during admission 03/30/2021 with LVEF 60 to 65%, RV normal size and function, normal PASP, bilateral atria moderately dilated, trivial MR, mild to moderate aortic valve sclerosis without stenosis.)       Anesthesia Quick Evaluation

## 2021-06-15 ENCOUNTER — Ambulatory Visit (HOSPITAL_COMMUNITY): Payer: Medicare Other | Admitting: Anesthesiology

## 2021-06-15 ENCOUNTER — Other Ambulatory Visit: Payer: Self-pay

## 2021-06-15 ENCOUNTER — Encounter (HOSPITAL_COMMUNITY): Payer: Self-pay | Admitting: Internal Medicine

## 2021-06-15 ENCOUNTER — Ambulatory Visit (HOSPITAL_COMMUNITY)
Admission: RE | Admit: 2021-06-15 | Discharge: 2021-06-15 | Disposition: A | Discharge status: Home / Self Care | Payer: Medicare Other | Attending: Internal Medicine | Admitting: Internal Medicine

## 2021-06-15 ENCOUNTER — Encounter (HOSPITAL_COMMUNITY)
Admission: RE | Disposition: A | Discharge status: Home / Self Care | Payer: Self-pay | Source: Home / Self Care | Attending: Internal Medicine

## 2021-06-15 DIAGNOSIS — I10 Essential (primary) hypertension: Secondary | ICD-10-CM | POA: Diagnosis not present

## 2021-06-15 DIAGNOSIS — I498 Other specified cardiac arrhythmias: Secondary | ICD-10-CM | POA: Diagnosis not present

## 2021-06-15 DIAGNOSIS — Z932 Ileostomy status: Secondary | ICD-10-CM | POA: Insufficient documentation

## 2021-06-15 DIAGNOSIS — I480 Paroxysmal atrial fibrillation: Secondary | ICD-10-CM | POA: Diagnosis not present

## 2021-06-15 DIAGNOSIS — E785 Hyperlipidemia, unspecified: Secondary | ICD-10-CM | POA: Diagnosis not present

## 2021-06-15 DIAGNOSIS — I4819 Other persistent atrial fibrillation: Secondary | ICD-10-CM | POA: Diagnosis not present

## 2021-06-15 DIAGNOSIS — I3481 Nonrheumatic mitral (valve) annulus calcification: Secondary | ICD-10-CM | POA: Insufficient documentation

## 2021-06-15 DIAGNOSIS — Z7901 Long term (current) use of anticoagulants: Secondary | ICD-10-CM | POA: Diagnosis not present

## 2021-06-15 DIAGNOSIS — E039 Hypothyroidism, unspecified: Secondary | ICD-10-CM | POA: Diagnosis not present

## 2021-06-15 HISTORY — PX: CARDIOVERSION: SHX1299

## 2021-06-15 LAB — PROTIME-INR
INR: 2.4 — ABNORMAL HIGH (ref 0.8–1.2)
Prothrombin Time: 26.2 seconds — ABNORMAL HIGH (ref 11.4–15.2)

## 2021-06-15 SURGERY — CARDIOVERSION
Anesthesia: General

## 2021-06-15 MED ORDER — PROPOFOL 10 MG/ML IV BOLUS
INTRAVENOUS | Status: DC | PRN
  Administered 2021-06-15: 60 mg via INTRAVENOUS

## 2021-06-15 MED ORDER — SODIUM CHLORIDE 0.9 % IV SOLN
INTRAVENOUS | Status: AC | PRN
  Administered 2021-06-15: 500 mL via INTRAVENOUS

## 2021-06-15 MED ORDER — LIDOCAINE 2% (20 MG/ML) 5 ML SYRINGE
INTRAMUSCULAR | Status: DC | PRN
  Administered 2021-06-15: 60 mg via INTRAVENOUS

## 2021-06-15 NOTE — Transfer of Care (Signed)
Immediate Anesthesia Transfer of Care Note  Patient: Jasmine Mccormick  Procedure(s) Performed: CARDIOVERSION  Patient Location: Endoscopy Unit  Anesthesia Type:General  Level of Consciousness: awake and alert   Airway & Oxygen Therapy: Patient Spontanous Breathing  Post-op Assessment: Report given to RN and Post -op Vital signs reviewed and stable  Post vital signs: Reviewed and stable  Last Vitals:  Vitals Value Taken Time  BP 99/52 06/15/21 0807  Temp    Pulse 63 06/15/21 0807  Resp 17 06/15/21 0807  SpO2 96 % 06/15/21 0807  Vitals shown include unvalidated device data.  Last Pain:  Vitals:   06/15/21 0656  TempSrc: Skin  PainSc: 0-No pain         Complications: No notable events documented.

## 2021-06-15 NOTE — Anesthesia Postprocedure Evaluation (Signed)
Anesthesia Post Note  Patient: Jasmine Mccormick  Procedure(s) Performed: CARDIOVERSION     Patient location during evaluation: PACU Anesthesia Type: General Level of consciousness: awake and alert Pain management: pain level controlled Vital Signs Assessment: post-procedure vital signs reviewed and stable Respiratory status: spontaneous breathing, nonlabored ventilation, respiratory function stable and patient connected to nasal cannula oxygen Cardiovascular status: blood pressure returned to baseline and stable Postop Assessment: no apparent nausea or vomiting Anesthetic complications: no   No notable events documented.  Last Vitals:  Vitals:   06/15/21 0656 06/15/21 0806  BP: (!) 152/93 (!) 99/52  Pulse: (!) 102 62  Resp: 14 16  Temp: (!) 36.3 C 36.7 C  SpO2: 98% 98%    Last Pain:  Vitals:   06/15/21 0806  TempSrc: Axillary  PainSc: 0-No pain                 Megann Easterwood

## 2021-06-15 NOTE — Interval H&P Note (Signed)
History and Physical Interval Note:  06/15/2021 7:30 AM  Jasmine Mccormick  has presented today for surgery, with the diagnosis of A-FIB.  The various methods of treatment have been discussed with the patient and family. After consideration of risks, benefits and other options for treatment, the patient has consented to  Procedure(s): CARDIOVERSION (N/A) as a surgical intervention.  The patient's history has been reviewed, patient examined, no change in status, stable for surgery.  I have reviewed the patient's chart and labs.  Questions were answered to the patient's satisfaction.     [name] A [place]

## 2021-06-15 NOTE — Discharge Instructions (Signed)
Electrical Cardioversion  Electrical cardioversion is the delivery of a jolt of electricity to restore a normal rhythm to the heart. A rhythm that is too fast or is not regular keeps the heart from pumping well. In this procedure, sticky patches or metal paddles are placed on the chest to deliver electricity to the heart from a device.  What can I expect after the procedure?  Your blood pressure, heart rate, breathing rate, and blood oxygen level will be monitored until you leave the hospital or clinic.  Your heart rhythm will be watched to make sure it does not change.  You may have some redness on the skin where the shocks were given.If this occurs, can use hydrocortisone cream or Aloe vera.  Follow these instructions at home:  Do not drive for 24 hours if you were given a sedative during your procedure.  Take over-the-counter and prescription medicines only as told by your health care provider.  Ask your health care provider how to check your pulse. Check it often.  Rest for 48 hours after the procedure or as told by your health care provider.  Avoid or limit your caffeine use as told by your health care provider.  Keep all follow-up visits as told by your health care provider. This is important.  Contact a health care provider if:  You feel like your heart is beating too quickly or your pulse is not regular.  You have a serious muscle cramp that does not go away.  Get help right away if:  You have discomfort in your chest.  You are dizzy or you feel faint.  You have trouble breathing or you are short of breath.  Your speech is slurred.  You have trouble moving an arm or leg on one side of your body.  Your fingers or toes turn cold or blue.  Summary  Electrical cardioversion is the delivery of a jolt of electricity to restore a normal rhythm to the heart.  This procedure may be done right away in an emergency or may be a scheduled procedure if the condition is not  an emergency.  Generally, this is a safe procedure.  After the procedure, check your pulse often as told by your health care provider.  This information is not intended to replace advice given to you by your health care provider. Make sure you discuss any questions you have with your health care provider. Document Revised: 03/04/2019 Document Reviewed: 03/04/2019 Elsevier Patient Education  2021 [employer]vier Inc.  

## 2021-06-16 ENCOUNTER — Encounter (HOSPITAL_COMMUNITY): Payer: Self-pay | Admitting: Internal Medicine

## 2021-06-16 ENCOUNTER — Other Ambulatory Visit: Payer: Self-pay | Admitting: *Deleted

## 2021-06-16 MED ORDER — EZETIMIBE-SIMVASTATIN 10-20 MG PO TABS: 1.0000 | ORAL_TABLET | Freq: Every day | ORAL | 3 refills | Status: DC

## 2021-06-22 ENCOUNTER — Telehealth: Payer: Self-pay | Admitting: *Deleted

## 2021-06-22 MED ORDER — EZETIMIBE 10 MG PO TABS: 10.0000 mg | ORAL_TABLET | Freq: Every day | ORAL | 1 refills | Status: DC

## 2021-06-22 NOTE — Telephone Encounter (Signed)
Rec'd fax stating we received rx for generic vytorin. Pt has simvastatin to use up. Would like a rx for ezetimibe for now. Sent rx for 10 mg../lmb

## 2021-06-24 NOTE — Telephone Encounter (Signed)
Hi Dr. Alain Marion,  I just wanted to send a FYI that pt has not yet started on Evenity.

## 2021-06-25 NOTE — Telephone Encounter (Signed)
Ok to start Thank you, AP

## 2021-06-28 ENCOUNTER — Encounter (HOSPITAL_BASED_OUTPATIENT_CLINIC_OR_DEPARTMENT_OTHER): Payer: Self-pay

## 2021-07-01 ENCOUNTER — Telehealth: Payer: Self-pay | Admitting: Cardiology

## 2021-07-01 ENCOUNTER — Encounter (HOSPITAL_BASED_OUTPATIENT_CLINIC_OR_DEPARTMENT_OTHER): Payer: Self-pay | Admitting: Cardiology

## 2021-07-01 ENCOUNTER — Ambulatory Visit (INDEPENDENT_AMBULATORY_CARE_PROVIDER_SITE_OTHER): Payer: Medicare Other | Admitting: Cardiology

## 2021-07-01 ENCOUNTER — Other Ambulatory Visit: Payer: Self-pay

## 2021-07-01 VITALS — BP 126/78 | HR 116 | Ht 65.0 in | Wt 152.0 lb

## 2021-07-01 DIAGNOSIS — Z7901 Long term (current) use of anticoagulants: Secondary | ICD-10-CM | POA: Diagnosis not present

## 2021-07-01 DIAGNOSIS — I480 Paroxysmal atrial fibrillation: Secondary | ICD-10-CM

## 2021-07-01 DIAGNOSIS — R600 Localized edema: Secondary | ICD-10-CM | POA: Diagnosis not present

## 2021-07-01 DIAGNOSIS — D6869 Other thrombophilia: Secondary | ICD-10-CM | POA: Diagnosis not present

## 2021-07-01 DIAGNOSIS — E7800 Pure hypercholesterolemia, unspecified: Secondary | ICD-10-CM | POA: Diagnosis not present

## 2021-07-01 MED ORDER — FUROSEMIDE 20 MG PO TABS: 20.0000 mg | ORAL_TABLET | Freq: Every day | ORAL | 5 refills | Status: DC | PRN

## 2021-07-01 MED ORDER — AMIODARONE HCL 200 MG PO TABS: ORAL_TABLET | ORAL | 0 refills | Status: DC

## 2021-07-01 NOTE — Telephone Encounter (Signed)
pT WAS SEEN IN OUR OFFICE TODAY. PT'S NIECE [facility] NEEDS SOME CLARIFICATION ON HOW PT IS SUPPOSED TO TAKE HER NEW MEDS. PLEASE ADVISE PT'S NIECE Olegario Messier [phone]

## 2021-07-01 NOTE — Telephone Encounter (Signed)
Call and spoke with pt concerning her question about when to take Amiodarone, pt was told by pharmacist after picking up new medication that she need to take it 6 hours after taking her cholesterol medication and she wants to know if that was the case because she will need to rearrange when to take her evening those of cholesterol, spoke with Dr Harrell Gave who stated it is ok to take both evening medication as it will not interact with each other advised pt of Dr Harrell Gave recommendation, pt voice understanding and thanks for the call back.

## 2021-07-01 NOTE — Progress Notes (Signed)
Cardiology Office Note:    Date:  07/15/2021   ID:  Jasmine Mccormick, DOB 1934/04/05, MRN [id number]  PCP:  Cassandria Anger, MD  Cardiologist:  Buford Dresser, MD  Referring MD: Cassandria Anger, MD   CC: post cardioversion follow up  History of Present Illness:    Jasmine Mccormick is a 87 y.o. female with a complex history, including ulcerative colitis s/p colectomy with ileostomy placement >20 years ago, HTN, and hypothyroidism, notable for recent distal SMA occlusion s/p thrombectomy. Found to have new atrial fibrillation during hospitalization 03/2021.   Today: She underwent cardioversion on 11/1. She converted to NSR at 74 bpm, but today she is back in atrial fibrillation.   Apixaban was very expensive (does not have medicare part D), followed by coumadin clinic at [facility].   She cannot feel when she is in afib. Swelling much improved post cardioversion. Has been trying to walk around the house several times/day.  Questions today: -memory issues/depression: recommendations? -does she need to continue the fluid pill?  We discussed both of these items today.  Denies chest pain, shortness of breath at rest or with normal exertion. No PND, orthopnea, LE edema or unexpected weight gain. No syncope or palpitations.   Past Medical History:  Diagnosis Date   COVID 12/18/2020   mild   HTN (hypertension)    Normal at home   Hyperlipidemia    Hypothyroidism    Ileostomy in place [facility])    Memory loss    mild but not dx by doctor per [place]   Meniere disease 1996   Osteoporosis    UC (ulcerative colitis) ([place])     Past Surgical History:  Procedure Laterality Date   CARDIOVERSION N/A 06/15/2021   Procedure: CARDIOVERSION;  Surgeon: Werner Lean, MD;  Location: [place];  Service: Cardiovascular;  Laterality: N/A;   COLECTOMY     COLONOSCOPY     Ear Shunt for Men[place]'s     ENDARTERECTOMY MESENTERIC Right 03/29/2021   Procedure: MESENTERIC  ARTERY THROMBECTOMY;  Surgeon: Serafina Mitchell, MD;  Location: [place];  Service: Vascular;  Laterality: Right;   ILEOSTOMY     INTRAOPERATIVE ARTERIOGRAM Right 03/29/2021   Procedure: INTRA OPERATIVE AORTOGRAM;  Surgeon: Serafina Mitchell, MD;  Location: [place];  Service: Vascular;  Laterality: Right;   KYPHOPLASTY N/A 01/07/2021   Procedure: Lumbar three Kyphoplasty;  Surgeon: Dawley, Theodoro Doing, DO;  Location: [place];  Service: Neurosurgery;  Laterality: N/A;   PROCTECTOMY     ULTRASOUND GUIDANCE FOR VASCULAR ACCESS Right 03/29/2021   Procedure: ULTRASOUND GUIDANCE FOR VASCULAR ACCESS;  Surgeon: Serafina Mitchell, MD;  [facility];  Service: Vascular;  Laterality: Right;    Current Medications: Current Outpatient Medications on File Prior to Visit  Medication Sig   calcium carbonate (TUMS - DOSED IN MG ELEMENTAL CALCIUM) 500 MG chewable tablet Chew 1 tablet by mouth as needed for indigestion or heartburn.   ezetimibe (ZETIA) 10 MG tablet Take 1 tablet (10 mg total) by mouth daily.   levothyroxine (SYNTHROID) 75 MCG tablet Take 1 tablet (75 mcg total) by mouth daily.   metoprolol succinate (TOPROL-XL) 50 MG 24 hr tablet Take 1.5 tablets (75 mg total) by mouth 2 (two) times daily. Take with or immediately following a meal.   Multiple Vitamins-Minerals (MULTIVITAMIN,TX-MINERALS) tablet Take 1 tablet by mouth daily.   simvastatin (ZOCOR) 20 MG tablet Take 20 mg by mouth daily.   warfarin (COUMADIN) 3 MG tablet Take 1  tablet (3 mg total) by mouth daily. (Patient taking differently: Take 3-6 mg by mouth See admin instructions. Take 3 mg on Sun, Mon, Wed, Thurs, and Fri. Take 6 mg on Tues and Sat.)   No current facility-administered medications on file prior to visit.     Allergies:   Patient has no known allergies.   Social History   Tobacco Use   Smoking status: Never   Smokeless tobacco: Never  Vaping Use   Vaping Use: Never used  Substance Use Topics   Alcohol use: No   Drug use: No     Family History: family history includes Breast cancer in her sister; Heart disease in an other family member; Hypertension in an other family member.  ROS:   Please see the history of present illness.  Additional pertinent ROS otherwise unremarkable.   EKGs/Labs/Other Studies Reviewed:    The following studies were reviewed today: Echocardiogram 03/30/21:   1. Patient in rapid afib during study.   2. Left ventricular ejection fraction, by estimation, is 60 to 65%. The  left ventricle has normal function. The left ventricle has no regional  wall motion abnormalities. Left ventricular diastolic parameters are  indeterminate.   3. Right ventricular systolic function is normal. The right ventricular  size is normal. There is normal pulmonary artery systolic pressure.   4. Left atrial size was moderately dilated.   5. Right atrial size was moderately dilated.   6. The mitral valve is degenerative. Trivial mitral valve regurgitation.  No evidence of mitral stenosis. Moderate mitral annular calcification.   7. The aortic valve is tricuspid. Aortic valve regurgitation is not  visualized. Mild to moderate aortic valve sclerosis/calcification is  present, without any evidence of aortic stenosis.   8. The inferior vena cava is normal in size with greater than 50%  respiratory variability, suggesting right atrial pressure of 3 mmHg.  EKG:  EKG is personally reviewed.   07/01/21 afib RVR at 116 bpm 04/15/21 afib RVR at 109 bpm  Recent Labs: 03/17/2021: TSH 9.46 03/31/2021: ALT 15 06/01/2021: BNP 325.9 06/08/2021: BUN 15; Creatinine, Ser 0.95; Hemoglobin 10.6; Platelets 355; Potassium 3.7; Sodium 134  Recent Lipid Panel    Component Value Date/Time   CHOL 122 04/01/2021 0410   TRIG 119 04/01/2021 0410   HDL 38 (L) 04/01/2021 0410   CHOLHDL 3.2 04/01/2021 0410   VLDL 24 04/01/2021 0410   LDLCALC 60 04/01/2021 0410    Physical Exam:    VS:  BP 126/78 (BP Location: Left Arm, Patient  Position: Sitting, Cuff Size: Normal)   Pulse (!) 116   Ht 5' 5"  (1.651 m)   Wt 152 lb (68.9 kg)   LMP  (LMP Unknown)   BMI 25.29 kg/m     Wt Readings from Last 3 Encounters:  07/01/21 152 lb (68.9 kg)  06/15/21 164 lb 0.4 oz (74.4 kg)  06/08/21 164 lb (74.4 kg)    GEN: Well nourished, well developed in no acute distress HEENT: Normal, moist mucous membranes NECK: No JVD CARDIAC: irregularly irregular rhythm, normal S1 and S2, no rubs or gallops. No murmur. VASCULAR: Radial and DP pulses 2+ bilaterally. No carotid bruits RESPIRATORY:  Largely clear ABDOMEN: Soft, non-tender, non-distended MUSCULOSKELETAL:  Ambulates independently SKIN: Warm and dry, trivial LLE edema. No redness or warmth. NEUROLOGIC:  Alert and oriented x 3. No focal neuro deficits noted. PSYCHIATRIC:  Normal affect    ASSESSMENT:    1. Secondary hypercoagulable state ([place])   2. Paroxysmal atrial  fibrillation ([place])   3. Bilateral leg edema   4. Current use of long term anticoagulation   5. Pure hypercholesterolemia    PLAN:    Paroxysmal atrial fibrillation: -did convert with cardioversion, but back in afib today -concern that SMA occlusion/mesenteric ischemia may have been embolic. Now on anticoagulation. Cannot afford DOAC unfortunately, so transitioned to coumadin. Does not have medicare part D -chadsvasc at least 4 -as she did not hold in sinus rhythm post cardioversion, will load with oral amiodarone and reassess  LE edema: improved, will changed to PRN furosemide   Hypercholesterolemia: LDL 60, ok to continue current regimen (simvastatin and ezetimibe). Alternative would be to change to high intensity statin, could then likely drop the ezetimibe and simplify her regimen. Appears she has been on the ezetimibe-simvastatin combination since at least 2008. She wishes to continue her current regimen  Cardiac risk counseling and prevention recommendations: -recommend heart healthy/Mediterranean diet,  with whole grains, fruits, vegetable, fish, lean meats, nuts, and olive oil. Limit salt. -recommend moderate walking, 3-5 times/week for 30-50 minutes each session. Aim for at least 150 minutes.week. Goal should be pace of 3 miles/hours, or walking 1.5 miles in 30 minutes -recommend avoidance of tobacco products. Avoid excess alcohol.  Plan for follow up: 2 months, readdress afib and potential cardioversion  Buford Dresser, MD, PhD, [facility]    Medication Adjustments/Labs and Tests Ordered: Current medicines are reviewed at length with the patient today.  Concerns regarding medicines are outlined above.  Orders Placed This Encounter  Procedures   EKG 12-Lead    Meds ordered this encounter  Medications   furosemide (LASIX) 20 MG tablet    Sig: Take 1 tablet (20 mg total) by mouth daily as needed for fluid.    Dispense:  30 tablet    Refill:  5   amiodarone (PACERONE) 200 MG tablet    Sig: Take 1 tablet (200 mg total) by mouth 2 (two) times daily for 14 days, THEN 1 tablet (200 mg total) daily.    Dispense:  104 tablet    Refill:  0     Patient Instructions  Look for a finger pulse ox to monitor heart rate. If heart rate is 60s-70s, it's probably sinus rhythm. If it is 90s-110s, it's probably afib.  Medication Instructions:  START- Amiodarone 200 mg by mouth twice a day for 2 week, then 200 mg by mouth daily  *If you need a refill on your cardiac medications before your next appointment, please call your pharmacy*   Lab Work: None Ordered   Testing/Procedures: None Ordered   Follow-Up: At [facility], you and your health needs are our priority.  As part of our continuing mission to provide you with exceptional heart care, we have created designated Provider Care Teams.  These Care Teams include your primary Cardiologist (physician) and Advanced Practice Providers (APPs -  Physician Assistants and Nurse Practitioners) who all work  together to provide you with the care you need, when you need it.  We recommend signing up for the patient portal called "MyChart".  Sign up information is provided on this After Visit Summary.  MyChart is used to connect with patients for Virtual Visits (Telemedicine).  Patients are able to view lab/test results, encounter notes, upcoming appointments, etc.  Non-urgent messages can be sent to your provider as well.   To learn more about what you can do with MyChart, go to [email or web address].    Your next appointment:  Tuesday January 10th @ 11:20 am  The format for your next appointment:   In Person  Provider:   Buford Dresser, MD    Signed, Buford Dresser, MD PhD 07/15/2021     [place]

## 2021-07-01 NOTE — Patient Instructions (Signed)
Look for a finger pulse ox to monitor heart rate. If heart rate is 60s-70s, it's probably sinus rhythm. If it is 90s-110s, it's probably afib.  Medication Instructions:  START- Amiodarone 200 mg by mouth twice a day for 2 week, then 200 mg by mouth daily  *If you need a refill on your cardiac medications before your next appointment, please call your pharmacy*   Lab Work: None Ordered   Testing/Procedures: None Ordered   Follow-Up: At [facility], you and your health needs are our priority.  As part of our continuing mission to provide you with exceptional heart care, we have created designated Provider Care Teams.  These Care Teams include your primary Cardiologist (physician) and Advanced Practice Providers (APPs -  Physician Assistants and Nurse Practitioners) who all work together to provide you with the care you need, when you need it.  We recommend signing up for the patient portal called "MyChart".  Sign up information is provided on this After Visit Summary.  MyChart is used to connect with patients for Virtual Visits (Telemedicine).  Patients are able to view lab/test results, encounter notes, upcoming appointments, etc.  Non-urgent messages can be sent to your provider as well.   To learn more about what you can do with MyChart, go to [email or web address].    Your next appointment:   Tuesday January 10th @ 11:20 am  The format for your next appointment:   In Person  Provider:   Buford Dresser, MD

## 2021-07-05 ENCOUNTER — Telehealth (HOSPITAL_BASED_OUTPATIENT_CLINIC_OR_DEPARTMENT_OTHER): Payer: Self-pay | Admitting: Cardiology

## 2021-07-05 ENCOUNTER — Ambulatory Visit (INDEPENDENT_AMBULATORY_CARE_PROVIDER_SITE_OTHER): Payer: Medicare Other | Admitting: Cardiology

## 2021-07-05 DIAGNOSIS — E785 Hyperlipidemia, unspecified: Secondary | ICD-10-CM | POA: Diagnosis not present

## 2021-07-05 DIAGNOSIS — I080 Rheumatic disorders of both mitral and aortic valves: Secondary | ICD-10-CM | POA: Diagnosis not present

## 2021-07-05 DIAGNOSIS — H8109 Meniere's disease, unspecified ear: Secondary | ICD-10-CM | POA: Diagnosis not present

## 2021-07-05 DIAGNOSIS — B351 Tinea unguium: Secondary | ICD-10-CM | POA: Diagnosis not present

## 2021-07-05 DIAGNOSIS — I480 Paroxysmal atrial fibrillation: Secondary | ICD-10-CM | POA: Diagnosis not present

## 2021-07-05 DIAGNOSIS — Z5181 Encounter for therapeutic drug level monitoring: Secondary | ICD-10-CM | POA: Diagnosis not present

## 2021-07-05 DIAGNOSIS — R413 Other amnesia: Secondary | ICD-10-CM | POA: Diagnosis not present

## 2021-07-05 DIAGNOSIS — M810 Age-related osteoporosis without current pathological fracture: Secondary | ICD-10-CM | POA: Diagnosis not present

## 2021-07-05 DIAGNOSIS — E039 Hypothyroidism, unspecified: Secondary | ICD-10-CM | POA: Diagnosis not present

## 2021-07-05 DIAGNOSIS — Z86718 Personal history of other venous thrombosis and embolism: Secondary | ICD-10-CM | POA: Diagnosis not present

## 2021-07-05 DIAGNOSIS — Z932 Ileostomy status: Secondary | ICD-10-CM | POA: Diagnosis not present

## 2021-07-05 DIAGNOSIS — Z9181 History of falling: Secondary | ICD-10-CM | POA: Diagnosis not present

## 2021-07-05 DIAGNOSIS — Z7901 Long term (current) use of anticoagulants: Secondary | ICD-10-CM | POA: Diagnosis not present

## 2021-07-05 DIAGNOSIS — I10 Essential (primary) hypertension: Secondary | ICD-10-CM | POA: Diagnosis not present

## 2021-07-05 LAB — POCT INR: INR: 3.2 — AB (ref 2.0–3.0)

## 2021-07-05 NOTE — Telephone Encounter (Signed)
Pt c/o medication issue:  1. Name of Medication:  amiodarone (PACERONE) 200 MG tablet warfarin (COUMADIN) 3 MG tablet  2. How are you currently taking this medication (dosage and times per day)? As directed  3. Are you having a reaction (difficulty breathing--STAT)?   4. What is your medication issue? Jasmine Mccormick, [place] Nurse was calling to report a medication interaction between the two medications

## 2021-07-11 NOTE — CV Procedure (Signed)
Provide Note: Re-entry of procedure note.    Electrical Cardioversion Procedure Note Jasmine Mccormick [id number] March 10, 1934  Procedure: Electrical Cardioversion Indications:  Atrial Fibrillation  Time Out: Verified patient identification, verified procedure,medications/allergies/relevent history reviewed, required imaging and test results available.  Performed  Procedure Details  The patient was NPO after midnight. Anesthesia was administered at the beside  by Dr.Oddone and team  Cardioversion was done with synchronized biphasic defibrillation with AP pads with 200 Joules.  The patient converted to normal sinus rhythm. The patient tolerated the procedure well   IMPRESSION:  Successful cardioversion of atrial fibrillation    Jasmine Mccormick Jasmine Mccormick 07/11/2021, 3:42 PM

## 2021-07-12 ENCOUNTER — Ambulatory Visit (INDEPENDENT_AMBULATORY_CARE_PROVIDER_SITE_OTHER): Payer: Medicare Other | Admitting: Pharmacist Clinician (PhC)/ Clinical Pharmacy Specialist

## 2021-07-12 DIAGNOSIS — E785 Hyperlipidemia, unspecified: Secondary | ICD-10-CM | POA: Diagnosis not present

## 2021-07-12 DIAGNOSIS — I480 Paroxysmal atrial fibrillation: Secondary | ICD-10-CM

## 2021-07-12 DIAGNOSIS — I080 Rheumatic disorders of both mitral and aortic valves: Secondary | ICD-10-CM | POA: Diagnosis not present

## 2021-07-12 DIAGNOSIS — Z7901 Long term (current) use of anticoagulants: Secondary | ICD-10-CM | POA: Diagnosis not present

## 2021-07-12 DIAGNOSIS — H8109 Meniere's disease, unspecified ear: Secondary | ICD-10-CM | POA: Diagnosis not present

## 2021-07-12 DIAGNOSIS — E039 Hypothyroidism, unspecified: Secondary | ICD-10-CM | POA: Diagnosis not present

## 2021-07-12 DIAGNOSIS — I10 Essential (primary) hypertension: Secondary | ICD-10-CM | POA: Diagnosis not present

## 2021-07-12 LAB — POCT INR: INR: 4.3 — AB (ref 2.0–3.0)

## 2021-07-15 ENCOUNTER — Encounter (HOSPITAL_BASED_OUTPATIENT_CLINIC_OR_DEPARTMENT_OTHER): Payer: Self-pay

## 2021-07-15 NOTE — Telephone Encounter (Deleted)
Contacted patient on her home number to schedule evenity. Patient's niece took a message with our office number to call back and schedule

## 2021-07-16 NOTE — Telephone Encounter (Signed)
Please advise 

## 2021-07-19 ENCOUNTER — Ambulatory Visit (INDEPENDENT_AMBULATORY_CARE_PROVIDER_SITE_OTHER): Payer: Medicare Other | Admitting: Cardiology

## 2021-07-19 DIAGNOSIS — E039 Hypothyroidism, unspecified: Secondary | ICD-10-CM | POA: Diagnosis not present

## 2021-07-19 DIAGNOSIS — H8109 Meniere's disease, unspecified ear: Secondary | ICD-10-CM | POA: Diagnosis not present

## 2021-07-19 DIAGNOSIS — I080 Rheumatic disorders of both mitral and aortic valves: Secondary | ICD-10-CM | POA: Diagnosis not present

## 2021-07-19 DIAGNOSIS — I480 Paroxysmal atrial fibrillation: Secondary | ICD-10-CM | POA: Diagnosis not present

## 2021-07-19 DIAGNOSIS — E785 Hyperlipidemia, unspecified: Secondary | ICD-10-CM | POA: Diagnosis not present

## 2021-07-19 DIAGNOSIS — Z5181 Encounter for therapeutic drug level monitoring: Secondary | ICD-10-CM | POA: Diagnosis not present

## 2021-07-19 DIAGNOSIS — I10 Essential (primary) hypertension: Secondary | ICD-10-CM | POA: Diagnosis not present

## 2021-07-19 LAB — POCT INR: INR: 2.2 (ref 2.0–3.0)

## 2021-07-19 NOTE — Patient Instructions (Signed)
Description   Spoke to [facility] - patient to hold warfarin x 2 days, resume Wednesday with 3 mg daily except 1.5 mg each Monday and Friday.  Repeat INR in 1 week

## 2021-07-19 NOTE — Telephone Encounter (Signed)
Unable to leave message, busy signal

## 2021-07-21 ENCOUNTER — Other Ambulatory Visit: Payer: Self-pay | Admitting: Family

## 2021-07-22 ENCOUNTER — Encounter (HOSPITAL_BASED_OUTPATIENT_CLINIC_OR_DEPARTMENT_OTHER): Payer: Self-pay

## 2021-07-23 ENCOUNTER — Other Ambulatory Visit (HOSPITAL_BASED_OUTPATIENT_CLINIC_OR_DEPARTMENT_OTHER): Payer: Self-pay | Admitting: Cardiology

## 2021-07-23 DIAGNOSIS — I480 Paroxysmal atrial fibrillation: Secondary | ICD-10-CM

## 2021-07-26 ENCOUNTER — Ambulatory Visit (INDEPENDENT_AMBULATORY_CARE_PROVIDER_SITE_OTHER): Payer: Medicare Other

## 2021-07-26 DIAGNOSIS — E039 Hypothyroidism, unspecified: Secondary | ICD-10-CM | POA: Diagnosis not present

## 2021-07-26 DIAGNOSIS — Z5181 Encounter for therapeutic drug level monitoring: Secondary | ICD-10-CM

## 2021-07-26 DIAGNOSIS — I480 Paroxysmal atrial fibrillation: Secondary | ICD-10-CM | POA: Diagnosis not present

## 2021-07-26 DIAGNOSIS — I10 Essential (primary) hypertension: Secondary | ICD-10-CM | POA: Diagnosis not present

## 2021-07-26 DIAGNOSIS — E785 Hyperlipidemia, unspecified: Secondary | ICD-10-CM | POA: Diagnosis not present

## 2021-07-26 DIAGNOSIS — H8109 Meniere's disease, unspecified ear: Secondary | ICD-10-CM | POA: Diagnosis not present

## 2021-07-26 DIAGNOSIS — I080 Rheumatic disorders of both mitral and aortic valves: Secondary | ICD-10-CM | POA: Diagnosis not present

## 2021-07-26 LAB — POCT INR: INR: 3.4 — AB (ref 2.0–3.0)

## 2021-07-26 NOTE — Patient Instructions (Signed)
Spoke w/ Judson Roch [facility] - advised her to have pt continue warfarin dosage of  3 mg daily except 1.5 mg each Monday and Friday.  Repeat INR in 1 week

## 2021-07-27 ENCOUNTER — Telehealth: Payer: Self-pay | Admitting: Internal Medicine

## 2021-07-27 NOTE — Telephone Encounter (Signed)
[place] Name: [place] Name: [place] Phone #: [phone] Service Requested: Nursing to continue to treat Frequency of Visits: 1 week 5

## 2021-07-28 NOTE — Telephone Encounter (Signed)
Concerns addressed at ov on 11/17.

## 2021-07-28 NOTE — Telephone Encounter (Signed)
Notified Katie ok for verbal pt is in good standing w/ appts.Marland KitchenJohny Chess

## 2021-07-30 ENCOUNTER — Encounter (HOSPITAL_BASED_OUTPATIENT_CLINIC_OR_DEPARTMENT_OTHER): Payer: Self-pay

## 2021-07-30 NOTE — Telephone Encounter (Signed)
Unable to reach pt on both numbers listed. Sent a My-Chart message.

## 2021-07-30 NOTE — Telephone Encounter (Signed)
Okay.  Thanks.

## 2021-07-30 NOTE — Telephone Encounter (Signed)
Please advise, if need be I can also send to the pharmD team!

## 2021-08-03 ENCOUNTER — Ambulatory Visit (INDEPENDENT_AMBULATORY_CARE_PROVIDER_SITE_OTHER): Payer: Medicare Other | Admitting: Cardiovascular Disease

## 2021-08-03 ENCOUNTER — Telehealth: Payer: Self-pay | Admitting: Internal Medicine

## 2021-08-03 DIAGNOSIS — E785 Hyperlipidemia, unspecified: Secondary | ICD-10-CM | POA: Diagnosis not present

## 2021-08-03 DIAGNOSIS — Z5181 Encounter for therapeutic drug level monitoring: Secondary | ICD-10-CM

## 2021-08-03 DIAGNOSIS — I080 Rheumatic disorders of both mitral and aortic valves: Secondary | ICD-10-CM | POA: Diagnosis not present

## 2021-08-03 DIAGNOSIS — H8109 Meniere's disease, unspecified ear: Secondary | ICD-10-CM | POA: Diagnosis not present

## 2021-08-03 DIAGNOSIS — I10 Essential (primary) hypertension: Secondary | ICD-10-CM | POA: Diagnosis not present

## 2021-08-03 DIAGNOSIS — E039 Hypothyroidism, unspecified: Secondary | ICD-10-CM | POA: Diagnosis not present

## 2021-08-03 DIAGNOSIS — I480 Paroxysmal atrial fibrillation: Secondary | ICD-10-CM | POA: Diagnosis not present

## 2021-08-03 LAB — POCT INR: INR: 4 — AB (ref 2.0–3.0)

## 2021-08-03 NOTE — Telephone Encounter (Signed)
Jasmine Mccormick called to report swelling on side of face from about two weeks ago, suspected to be from an insect bite.    No neurological issues. Swelling is back, symmetrical smile. Swelling causing R eyelid to droop down. No slur,ring speech, even smile and grips.

## 2021-08-03 NOTE — Patient Instructions (Signed)
Description   Spoke w/ Stanton Kidney [facility] and pt's niece- advised to have pt hold warfarin today and then START taking warfarin 1 tablet daily except for 1/2 a tablet on Monday, Wednesday and Friday.   Repeat INR in 1 week

## 2021-08-03 NOTE — Telephone Encounter (Signed)
I am not sure what it is.  She should be seen in the office.  Please schedule with any provider.  Thanks

## 2021-08-04 ENCOUNTER — Encounter (HOSPITAL_BASED_OUTPATIENT_CLINIC_OR_DEPARTMENT_OTHER): Payer: Self-pay

## 2021-08-04 DIAGNOSIS — M810 Age-related osteoporosis without current pathological fracture: Secondary | ICD-10-CM | POA: Diagnosis not present

## 2021-08-04 DIAGNOSIS — I480 Paroxysmal atrial fibrillation: Secondary | ICD-10-CM | POA: Diagnosis not present

## 2021-08-04 DIAGNOSIS — E785 Hyperlipidemia, unspecified: Secondary | ICD-10-CM | POA: Diagnosis not present

## 2021-08-04 DIAGNOSIS — Z9181 History of falling: Secondary | ICD-10-CM | POA: Diagnosis not present

## 2021-08-04 DIAGNOSIS — I080 Rheumatic disorders of both mitral and aortic valves: Secondary | ICD-10-CM | POA: Diagnosis not present

## 2021-08-04 DIAGNOSIS — B351 Tinea unguium: Secondary | ICD-10-CM | POA: Diagnosis not present

## 2021-08-04 DIAGNOSIS — Z7901 Long term (current) use of anticoagulants: Secondary | ICD-10-CM | POA: Diagnosis not present

## 2021-08-04 DIAGNOSIS — H8109 Meniere's disease, unspecified ear: Secondary | ICD-10-CM | POA: Diagnosis not present

## 2021-08-04 DIAGNOSIS — R413 Other amnesia: Secondary | ICD-10-CM | POA: Diagnosis not present

## 2021-08-04 DIAGNOSIS — I10 Essential (primary) hypertension: Secondary | ICD-10-CM | POA: Diagnosis not present

## 2021-08-04 DIAGNOSIS — Z86718 Personal history of other venous thrombosis and embolism: Secondary | ICD-10-CM | POA: Diagnosis not present

## 2021-08-04 DIAGNOSIS — Z5181 Encounter for therapeutic drug level monitoring: Secondary | ICD-10-CM | POA: Diagnosis not present

## 2021-08-04 DIAGNOSIS — Z932 Ileostomy status: Secondary | ICD-10-CM | POA: Diagnosis not present

## 2021-08-04 DIAGNOSIS — E039 Hypothyroidism, unspecified: Secondary | ICD-10-CM | POA: Diagnosis not present

## 2021-08-04 NOTE — Telephone Encounter (Signed)
Pt has appt scheduled for 08/10/21,,,/lmb

## 2021-08-08 ENCOUNTER — Telehealth: Payer: Self-pay | Admitting: Gastroenterology

## 2021-08-08 ENCOUNTER — Telehealth: Payer: Self-pay | Admitting: Physician Assistant

## 2021-08-08 NOTE — Telephone Encounter (Signed)
87 y.o. female with hx paroxysmal atrial fibrillation on warfarin for anticoagulation and recently placed on Amiodarone after a failed DCCV.  Her niece Conchita [place], [place] on file) called the answering service b/c the patient has been bleeding from her stoma into her colostomy bag today.  Her INR last week was 4.  She did not have any pain.  She is not followed by GI.   I advised her niece to go to the ED or one of the [place] EDs for evaluation. She agrees with this plan. Richardson Dopp, PA-C    08/08/2021 2:50 PM

## 2021-08-08 NOTE — Telephone Encounter (Signed)
Received a call from the patient's niece who is her caregiver. Patient has not seen [place] gastroenterology in over 20 years. She is having bleeding per ostomy. She has had recent issues with acute mesenteric ischemia requiring mechanical thrombectomy and is on anticoagulation. She is having bleeding per ostomy. She is not a current patient of [place] gastro[facility]ology so I cannot give her any more information or advice other than to say that the setting of having blood per ostomy that she likely needs further evaluation and with her history and her being on a blood thinner she needs further evaluation in the emergency department. They will decide what they want to do. She was appreciative for the call back. I am sending this note to the patient's PCP, whom she has a upcoming clinic visit with next week and for whom she may want a referral to [facility] gastroenterology. If a referral is placed, she can be seen by any [place] gastroenterologist.   Justice Britain, MD [facility] Gastroenterology Advanced Endoscopy Office # [phone]

## 2021-08-09 NOTE — Telephone Encounter (Signed)
Noted. Thank you, AP

## 2021-08-10 ENCOUNTER — Ambulatory Visit: Payer: Self-pay | Admitting: Pharmacist Clinician (PhC)/ Clinical Pharmacy Specialist

## 2021-08-10 ENCOUNTER — Encounter: Payer: Self-pay | Admitting: Internal Medicine

## 2021-08-10 ENCOUNTER — Other Ambulatory Visit: Payer: Self-pay

## 2021-08-10 ENCOUNTER — Ambulatory Visit (INDEPENDENT_AMBULATORY_CARE_PROVIDER_SITE_OTHER): Payer: Medicare Other | Admitting: Internal Medicine

## 2021-08-10 VITALS — BP 120/80 | HR 87 | Temp 98.0°F | Ht 65.0 in | Wt 159.2 lb

## 2021-08-10 DIAGNOSIS — E039 Hypothyroidism, unspecified: Secondary | ICD-10-CM | POA: Diagnosis not present

## 2021-08-10 DIAGNOSIS — I10 Essential (primary) hypertension: Secondary | ICD-10-CM

## 2021-08-10 DIAGNOSIS — H8109 Meniere's disease, unspecified ear: Secondary | ICD-10-CM | POA: Diagnosis not present

## 2021-08-10 DIAGNOSIS — I080 Rheumatic disorders of both mitral and aortic valves: Secondary | ICD-10-CM | POA: Diagnosis not present

## 2021-08-10 DIAGNOSIS — K55069 Acute infarction of intestine, part and extent unspecified: Secondary | ICD-10-CM

## 2021-08-10 DIAGNOSIS — I480 Paroxysmal atrial fibrillation: Secondary | ICD-10-CM | POA: Diagnosis not present

## 2021-08-10 DIAGNOSIS — M810 Age-related osteoporosis without current pathological fracture: Secondary | ICD-10-CM | POA: Diagnosis not present

## 2021-08-10 DIAGNOSIS — K921 Melena: Secondary | ICD-10-CM | POA: Diagnosis not present

## 2021-08-10 DIAGNOSIS — E785 Hyperlipidemia, unspecified: Secondary | ICD-10-CM | POA: Diagnosis not present

## 2021-08-10 LAB — POCT INR: INR: 2.8 (ref 2.0–3.0)

## 2021-08-10 LAB — CBC WITH DIFFERENTIAL/PLATELET
Basophils Absolute: 0 10*3/uL (ref 0.0–0.1)
Basophils Relative: 0.6 % (ref 0.0–3.0)
Eosinophils Absolute: 0.1 10*3/uL (ref 0.0–0.7)
Eosinophils Relative: 2 % (ref 0.0–5.0)
HCT: 34.9 % — ABNORMAL LOW (ref 36.0–46.0)
Hemoglobin: 10.8 g/dL — ABNORMAL LOW (ref 12.0–15.0)
Lymphocytes Relative: 14.3 % (ref 12.0–46.0)
Lymphs Abs: 0.9 10*3/uL (ref 0.7–4.0)
MCHC: 31 g/dL (ref 30.0–36.0)
MCV: 72.6 fl — ABNORMAL LOW (ref 78.0–100.0)
Monocytes Absolute: 0.9 10*3/uL (ref 0.1–1.0)
Monocytes Relative: 13 % — ABNORMAL HIGH (ref 3.0–12.0)
Neutro Abs: 4.6 10*3/uL (ref 1.4–7.7)
Neutrophils Relative %: 70.1 % (ref 43.0–77.0)
Platelets: 282 10*3/uL (ref 150.0–400.0)
RBC: 4.8 Mil/uL (ref 3.87–5.11)
RDW: 22 % — ABNORMAL HIGH (ref 11.5–15.5)
WBC: 6.6 10*3/uL (ref 4.0–10.5)

## 2021-08-10 MED ORDER — DENOSUMAB 60 MG/ML ~~LOC~~ SOSY
60.0000 mg | PREFILLED_SYRINGE | Freq: Once | SUBCUTANEOUS | Status: AC
  Administered 2021-08-10: 17:00:00 60 mg via SUBCUTANEOUS

## 2021-08-10 MED ORDER — TRIAMCINOLONE ACETONIDE 0.1 % EX OINT: 1.0000 "application " | TOPICAL_OINTMENT | Freq: Three times a day (TID) | CUTANEOUS | 3 refills | Status: DC | PRN

## 2021-08-10 MED ORDER — DENOSUMAB 60 MG/ML ~~LOC~~ SOSY: 60.0000 mg | PREFILLED_SYRINGE | Freq: Once | SUBCUTANEOUS | 0 refills | Status: AC

## 2021-08-10 NOTE — Assessment & Plan Note (Signed)
Metoprolol, Verapamil, Aceon BP is ok at home - always nl 

## 2021-08-10 NOTE — Patient Instructions (Addendum)
°  CERA V

## 2021-08-10 NOTE — Assessment & Plan Note (Signed)
New atrial fibrillation during hospitalization 03/2021.  She underwent cardioversion on 11/1. She converted to NSR at 74 bpm, but today she is back in atrial fibrillation.  Apixaban was very expensive - followed by coumadin clinic at [facility].

## 2021-08-10 NOTE — Assessment & Plan Note (Addendum)
New - blood in the ostomy bag. On Coumadin. GI consult - Dr Early Osmond Check CBC

## 2021-08-10 NOTE — Progress Notes (Signed)
Subjective:  Patient ID: Jasmine Mccormick, female    DOB: Aug 01, 1934  Age: 87 y.o. MRN: [id number]  CC: Follow-up (3 month follow up)   HPI Jasmine Mccormick presents for blood in stool. 87 y.o. female with a complex history, including ulcerative colitis s/p colectomy with ileostomy placement >20 years ago, HTN, and hypothyroidism, notable for recent distal SMA (mesenteric) occlusion s/p thrombectomy. Found to have new atrial fibrillation during hospitalization 03/2021.  She underwent cardioversion on 11/1. She converted to NSR at 74 bpm, but today she is back in atrial fibrillation.  Apixaban was very expensive - followed by coumadin clinic at [facility]  She cannot feel when she is in afib. Swelling much improved post cardioversion. Has been trying to walk around the house several times/day. C/o blood in the colostomy bag on Sunday... She is here w/Debbie - she helps w/HPI   Outpatient Medications Prior to Visit  Medication Sig Dispense Refill   amiodarone (PACERONE) 200 MG tablet Take 1 tablet (200 mg total) by mouth daily. 90 tablet 3   calcium carbonate (TUMS - DOSED IN MG ELEMENTAL CALCIUM) 500 MG chewable tablet Chew 1 tablet by mouth as needed for indigestion or heartburn.     ezetimibe (ZETIA) 10 MG tablet Take 1 tablet (10 mg total) by mouth daily. 90 tablet 1   furosemide (LASIX) 20 MG tablet Take 1 tablet (20 mg total) by mouth daily as needed for fluid. 30 tablet 5   levothyroxine (SYNTHROID) 75 MCG tablet Take 1 tablet (75 mcg total) by mouth daily. 90 tablet 1   metoprolol succinate (TOPROL-XL) 50 MG 24 hr tablet Take 1.5 tablets (75 mg total) by mouth 2 (two) times daily. Take with or immediately following a meal. 90 tablet 2   Multiple Vitamins-Minerals (MULTIVITAMIN,TX-MINERALS) tablet Take 1 tablet by mouth daily.     simvastatin (ZOCOR) 20 MG tablet Take 20 mg by mouth daily.     warfarin (COUMADIN) 3 MG tablet Take 1 tablet Daily or as prescribed by Clinic  30 tablet 1   No facility-administered medications prior to visit.    ROS: Review of Systems  Constitutional:  Positive for fatigue. Negative for activity change, appetite change, chills and unexpected weight change.  HENT:  Negative for congestion, mouth sores and sinus pressure.   Eyes:  Negative for visual disturbance.  Respiratory:  Negative for cough and chest tightness.   Gastrointestinal:  Positive for blood in stool. Negative for abdominal pain and nausea.  Genitourinary:  Negative for difficulty urinating, frequency and vaginal pain.  Musculoskeletal:  Positive for arthralgias and gait problem. Negative for back pain.  Skin:  Negative for pallor and rash.  Neurological:  Positive for weakness. Negative for dizziness, tremors, numbness and headaches.  Psychiatric/Behavioral:  Positive for confusion and decreased concentration. Negative for behavioral problems, sleep disturbance and suicidal ideas. The patient is nervous/anxious.    Objective:  BP 120/80 (BP Location: Left Arm, Patient Position: Sitting, Cuff Size: Normal)    Pulse 87    Temp 98 F (36.7 C) (Oral)    Ht 5\' 5"  (1.651 m)    Wt 159 lb 3.2 oz (72.2 kg)    LMP  (LMP Unknown)    SpO2 98%    BMI 26.49 kg/m   BP Readings from Last 3 Encounters:  08/10/21 120/80  07/01/21 126/78  06/15/21 131/71    Wt Readings from Last 3 Encounters:  08/10/21 159 lb 3.2 oz (72.2 kg)  07/01/21 152 lb (68.9 kg)  06/15/21 164 lb 0.4 oz (74.4 kg)    Physical Exam Constitutional:      General: She is not in acute distress.    Appearance: She is well-developed.  HENT:     Head: Normocephalic.     Right Ear: External ear normal.     Left Ear: External ear normal.     Nose: Nose normal.  Eyes:     General:        Right eye: No discharge.        Left eye: No discharge.     Conjunctiva/sclera: Conjunctivae normal.     Pupils: Pupils are equal, round, and reactive to light.  Neck:     Thyroid: No thyromegaly.     Vascular: No  JVD.     Trachea: No tracheal deviation.  Cardiovascular:     Rate and Rhythm: Normal rate and regular rhythm.     Heart sounds: Normal heart sounds.  Pulmonary:     Effort: No respiratory distress.     Breath sounds: No stridor. No wheezing.  Abdominal:     General: Bowel sounds are normal. There is no distension.     Palpations: Abdomen is soft. There is no mass.     Tenderness: There is no abdominal tenderness. There is no guarding or rebound.  Musculoskeletal:        General: No tenderness.     Cervical back: Normal range of motion and neck supple. No rigidity.  Lymphadenopathy:     Cervical: No cervical adenopathy.  Skin:    Findings: No erythema or rash.  Neurological:     Cranial Nerves: No cranial nerve deficit.     Motor: No abnormal muscle tone.     Coordination: Coordination normal.     Deep Tendon Reflexes: Reflexes normal.  Psychiatric:        Behavior: Behavior normal.        Thought Content: Thought content normal.        Judgment: Judgment normal.  Marked kyphosis The patient is forgetful  Lab Results  Component Value Date   WBC 8.5 06/08/2021   HGB 10.6 (L) 06/08/2021   HCT 33.9 (L) 06/08/2021   PLT 355 06/08/2021   GLUCOSE 88 06/08/2021   CHOL 122 04/01/2021   TRIG 119 04/01/2021   HDL 38 (L) 04/01/2021   LDLCALC 60 04/01/2021   ALT 15 03/31/2021   AST 26 03/31/2021   NA 134 06/08/2021   K 3.7 06/08/2021   CL 94 (L) 06/08/2021   CREATININE 0.95 06/08/2021   BUN 15 06/08/2021   CO2 26 06/08/2021   TSH 9.46 (H) 03/17/2021   INR 2.8 08/10/2021   HGBA1C 6.2 09/17/2020    No results found.  Assessment & Plan:   Problem List Items Addressed This Visit     Essential hypertension    Metoprolol, Verapamil, Aceon BP is ok at home - always nl      Hematochezia    New - blood in the ostomy bag. On Coumadin. GI consult - Dr Early Osmond Check CBC      Relevant Orders   Ambulatory referral to Gastroenterology   CBC with Differential/Platelet    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HCC)    S/p distal SMA (mesenteric) occlusion s/p thrombectomy. Found to have new atrial fibrillation during hospitalization 03/2021. On Coumadin      Paroxysmal atrial fibrillation (HCC)    New atrial fibrillation during hospitalization 03/2021.  She underwent cardioversion on 11/1. She converted to NSR at  74 bpm, but today she is back in atrial fibrillation.  Apixaban was very expensive - followed by coumadin clinic at [facility]          Meds ordered this encounter  Medications   triamcinolone ointment (KENALOG) 0.1 %    Sig: Apply 1 application topically 3 (three) times daily as needed.    Dispense:  80 g    Refill:  3      Follow-up: Return in about 3 months (around 11/08/2021) for a follow-up visit.  Walker Kehr, MD

## 2021-08-10 NOTE — Assessment & Plan Note (Signed)
S/p distal SMA (mesenteric) occlusion s/p thrombectomy. Found to have new atrial fibrillation during hospitalization 03/2021. On Coumadin

## 2021-08-13 ENCOUNTER — Other Ambulatory Visit: Payer: Self-pay | Admitting: Family

## 2021-08-13 DIAGNOSIS — I480 Paroxysmal atrial fibrillation: Secondary | ICD-10-CM

## 2021-08-13 NOTE — Telephone Encounter (Signed)
Warfarin refill request.

## 2021-08-17 ENCOUNTER — Telehealth: Payer: Self-pay

## 2021-08-17 ENCOUNTER — Telehealth: Payer: Self-pay | Admitting: Cardiology

## 2021-08-17 ENCOUNTER — Ambulatory Visit (INDEPENDENT_AMBULATORY_CARE_PROVIDER_SITE_OTHER): Payer: Medicare Other

## 2021-08-17 DIAGNOSIS — I480 Paroxysmal atrial fibrillation: Secondary | ICD-10-CM

## 2021-08-17 DIAGNOSIS — Z5181 Encounter for therapeutic drug level monitoring: Secondary | ICD-10-CM | POA: Diagnosis not present

## 2021-08-17 DIAGNOSIS — H8109 Meniere's disease, unspecified ear: Secondary | ICD-10-CM | POA: Diagnosis not present

## 2021-08-17 DIAGNOSIS — E785 Hyperlipidemia, unspecified: Secondary | ICD-10-CM | POA: Diagnosis not present

## 2021-08-17 DIAGNOSIS — E039 Hypothyroidism, unspecified: Secondary | ICD-10-CM | POA: Diagnosis not present

## 2021-08-17 DIAGNOSIS — I080 Rheumatic disorders of both mitral and aortic valves: Secondary | ICD-10-CM | POA: Diagnosis not present

## 2021-08-17 DIAGNOSIS — I10 Essential (primary) hypertension: Secondary | ICD-10-CM | POA: Diagnosis not present

## 2021-08-17 LAB — POCT INR: INR: 3.6 — AB (ref 2.0–3.0)

## 2021-08-17 NOTE — Telephone Encounter (Signed)
Please see anti-coag note for today. 

## 2021-08-17 NOTE — Telephone Encounter (Signed)
Judson Roch from [place] is calling to report a critical INR. It's 3.6, pt denies bleeding. Pt had "a couple spoonful of greens for new years." Per Judson Roch the pt's numbers have been elevated for a few weeks. Will get message to appropriate staff to handle this matter.

## 2021-08-17 NOTE — Telephone Encounter (Signed)
Per Threasa Beards D hold Warfarin today and then resume regular dose below   warfarin 1 tablet daily except for 1/2 a tablet on Monday, Wednesday and Friday  INR check next week   Advised niece who handles her medications   Left message for [facility] to call back tomorrow

## 2021-08-17 NOTE — Telephone Encounter (Signed)
Returned call to Judson Roch, RN to receive Critical INR. No answer voicemail left with call back instructions

## 2021-08-17 NOTE — Patient Instructions (Signed)
Left message for Judson Roch [facility] RN advising her to have pt hold warfarin today and then resume taking warfarin 1 tablet daily except for 1/2 a tablet on Monday, Wednesday and Friday.   Repeat INR in 1 week

## 2021-08-17 NOTE — Telephone Encounter (Signed)
Spoke with Judson Roch. Pt's INR is 3.6.

## 2021-08-17 NOTE — Telephone Encounter (Signed)
Judson Roch is calling to report critical INR results.

## 2021-08-19 NOTE — Telephone Encounter (Signed)
Looks like pt received Prolia inj at [place] 08/10/21. Message sent to Dr. Alain Marion to confirm osteoporosis treatment.

## 2021-08-23 ENCOUNTER — Ambulatory Visit (INDEPENDENT_AMBULATORY_CARE_PROVIDER_SITE_OTHER): Payer: Medicare Other | Admitting: Cardiovascular Disease

## 2021-08-23 DIAGNOSIS — I080 Rheumatic disorders of both mitral and aortic valves: Secondary | ICD-10-CM | POA: Diagnosis not present

## 2021-08-23 DIAGNOSIS — H8109 Meniere's disease, unspecified ear: Secondary | ICD-10-CM | POA: Diagnosis not present

## 2021-08-23 DIAGNOSIS — I480 Paroxysmal atrial fibrillation: Secondary | ICD-10-CM

## 2021-08-23 DIAGNOSIS — E039 Hypothyroidism, unspecified: Secondary | ICD-10-CM | POA: Diagnosis not present

## 2021-08-23 DIAGNOSIS — Z5181 Encounter for therapeutic drug level monitoring: Secondary | ICD-10-CM | POA: Diagnosis not present

## 2021-08-23 DIAGNOSIS — E785 Hyperlipidemia, unspecified: Secondary | ICD-10-CM | POA: Diagnosis not present

## 2021-08-23 DIAGNOSIS — I10 Essential (primary) hypertension: Secondary | ICD-10-CM | POA: Diagnosis not present

## 2021-08-23 LAB — POCT INR: INR: 3.7 — AB (ref 2.0–3.0)

## 2021-08-24 ENCOUNTER — Other Ambulatory Visit: Payer: Self-pay

## 2021-08-24 ENCOUNTER — Encounter: Payer: Self-pay | Admitting: Internal Medicine

## 2021-08-24 ENCOUNTER — Encounter (HOSPITAL_BASED_OUTPATIENT_CLINIC_OR_DEPARTMENT_OTHER): Payer: Self-pay | Admitting: Cardiology

## 2021-08-24 ENCOUNTER — Ambulatory Visit (INDEPENDENT_AMBULATORY_CARE_PROVIDER_SITE_OTHER): Payer: Medicare Other | Admitting: Cardiology

## 2021-08-24 VITALS — BP 118/82 | HR 94 | Resp 20 | Ht 64.0 in | Wt 161.0 lb

## 2021-08-24 DIAGNOSIS — I480 Paroxysmal atrial fibrillation: Secondary | ICD-10-CM

## 2021-08-24 DIAGNOSIS — Z7901 Long term (current) use of anticoagulants: Secondary | ICD-10-CM | POA: Diagnosis not present

## 2021-08-24 DIAGNOSIS — R600 Localized edema: Secondary | ICD-10-CM | POA: Diagnosis not present

## 2021-08-24 DIAGNOSIS — D6869 Other thrombophilia: Secondary | ICD-10-CM

## 2021-08-24 DIAGNOSIS — E7800 Pure hypercholesterolemia, unspecified: Secondary | ICD-10-CM

## 2021-08-24 NOTE — Progress Notes (Incomplete)
Cardiology Office Note:    Date:  08/24/2021   ID:  Jasmine Mccormick, DOB [birth year], MRN [id number]  PCP:  Cassandria Anger, MD  Cardiologist:  Buford Dresser, MD  Referring MD: Cassandria Anger, MD   CC: post cardioversion follow up  History of Present Illness:    Jasmine Mccormick is a 88 y.o. female with a complex history, including ulcerative colitis s/p colectomy with ileostomy placement >20 years ago, HTN, and hypothyroidism, notable for recent distal SMA occlusion s/p thrombectomy. Found to have new atrial fibrillation during hospitalization 03/2021.   Cardiac History: She underwent cardioversion on 06/15/2021. She converted to NSR at 74 bpm  Apixaban was very expensive (does not have medicare part D), followed by coumadin clinic at [facility].   Today: She is accompanied by a family member who also provides the history. Overall she is feeling pretty good.   Yesterday her INR was reportedly high.  She continues to have bilateral lower extremity edema, and tries to elevate her legs. She reports her edema is not always as severe as in clinic today.  Lately she is exercising 2 days a week at the [facility].  She denies any palpitations, chest pain, or shortness of breath. No lightheadedness, headaches, syncope, orthopnea, PND, or exertional symptoms.   Past Medical History:  Diagnosis Date   COVID 12/18/2020   mild   HTN (hypertension)    Normal at home   Hyperlipidemia    Hypothyroidism    Ileostomy in place [facility])    Memory loss    mild but not dx by doctor per Neice   Meniere disease 1996   Osteoporosis    UC (ulcerative colitis) ([place])     Past Surgical History:  Procedure Laterality Date   CARDIOVERSION N/A 06/15/2021   Procedure: CARDIOVERSION;  Surgeon: Werner Lean, MD;  Location: [place];  Service: Cardiovascular;  Laterality: N/A;   COLECTOMY     COLONOSCOPY     Ear Shunt for Menier's     ENDARTERECTOMY MESENTERIC Right  03/29/2021   Procedure: MESENTERIC ARTERY THROMBECTOMY;  Surgeon: Serafina Mitchell, MD;  Location: [place];  Service: Vascular;  Laterality: Right;   ILEOSTOMY     INTRAOPERATIVE ARTERIOGRAM Right 03/29/2021   Procedure: INTRA OPERATIVE AORTOGRAM;  Surgeon: Serafina Mitchell, MD;  Location: [place];  Service: Vascular;  Laterality: Right;   KYPHOPLASTY N/A 01/07/2021   Procedure: Lumbar three Kyphoplasty;  Surgeon: Dawley, Theodoro Doing, DO;  Location: [place];  Service: Neurosurgery;  Laterality: N/A;   PROCTECTOMY     ULTRASOUND GUIDANCE FOR VASCULAR ACCESS Right 03/29/2021   Procedure: ULTRASOUND GUIDANCE FOR VASCULAR ACCESS;  Surgeon: Serafina Mitchell, MD;  [facility]: [facility];  Service: Vascular;  Laterality: Right;    Current Medications: Current Outpatient Medications on File Prior to Visit  Medication Sig   amiodarone (PACERONE) 200 MG tablet Take 1 tablet (200 mg total) by mouth daily.   calcium carbonate (TUMS - DOSED IN MG ELEMENTAL CALCIUM) 500 MG chewable tablet Chew 1 tablet by mouth as needed for indigestion or heartburn.   ezetimibe (ZETIA) 10 MG tablet Take 1 tablet (10 mg total) by mouth daily.   furosemide (LASIX) 20 MG tablet Take 1 tablet (20 mg total) by mouth daily as needed for fluid.   levothyroxine (SYNTHROID) 75 MCG tablet Take 1 tablet (75 mcg total) by mouth daily.   metoprolol succinate (TOPROL-XL) 50 MG 24 hr tablet Take 1.5 tablets (75 mg total) by mouth 2 (  two) times daily. Take with or immediately following a meal.   Multiple Vitamins-Minerals (MULTIVITAMIN,TX-MINERALS) tablet Take 1 tablet by mouth daily.   simvastatin (ZOCOR) 20 MG tablet Take 20 mg by mouth daily.   triamcinolone ointment (KENALOG) 0.1 % Apply 1 application topically 3 (three) times daily as needed.   warfarin (COUMADIN) 3 MG tablet TAKE 1 TABLET DAILY OR AS PRESCRIBED BY CLINIC (Patient taking differently: 3 mg. Take 1 tablet MON-FRI AND 1/2 TAB EVERY OTHER DAY  as prescribed by Clinic)   No current  facility-administered medications on file prior to visit.     Allergies:   Patient has no known allergies.   Social History   Tobacco Use   Smoking status: Never   Smokeless tobacco: Never  Vaping Use   Vaping Use: Never used  Substance Use Topics   Alcohol use: No   Drug use: No    Family History: family history includes Breast cancer in her sister; Heart disease in an other family member; Hypertension in an other family member.  ROS:   Please see the history of present illness.   (+) Bilateral LE edema Additional pertinent ROS otherwise unremarkable.   EKGs/Labs/Other Studies Reviewed:    The following studies were reviewed today:  Echocardiogram 03/30/21:   1. Patient in rapid afib during study.   2. Left ventricular ejection fraction, by estimation, is 60 to 65%. The  left ventricle has normal function. The left ventricle has no regional  wall motion abnormalities. Left ventricular diastolic parameters are  indeterminate.   3. Right ventricular systolic function is normal. The right ventricular  size is normal. There is normal pulmonary artery systolic pressure.   4. Left atrial size was moderately dilated.   5. Right atrial size was moderately dilated.   6. The mitral valve is degenerative. Trivial mitral valve regurgitation.  No evidence of mitral stenosis. Moderate mitral annular calcification.   7. The aortic valve is tricuspid. Aortic valve regurgitation is not  visualized. Mild to moderate aortic valve sclerosis/calcification is  present, without any evidence of aortic stenosis.   8. The inferior vena cava is normal in size with greater than 50%  respiratory variability, suggesting right atrial pressure of 3 mmHg.  CTA Abdomen/Pelvis 03/29/2021: IMPRESSION: VASCULAR   1. 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 2. Moderate to marked severity diffuse calcification and atherosclerosis throughout the remaining  arterial structures within the abdomen and pelvis.   NON-VASCULAR   1. 2 mm nonobstructing left renal stone. 2. Postoperative changes with a subsequent right lower quadrant ostomy site. 3. Large hiatal hernia.  EKG:  EKG is personally reviewed.   08/24/2021: EKG was not ordered. 07/01/21: afib RVR at 116 bpm 04/15/21: afib RVR at 109 bpm  Recent Labs: 03/17/2021: TSH 9.46 03/31/2021: ALT 15 06/01/2021: BNP 325.9 06/08/2021: BUN 15; Creatinine, Ser 0.95; Potassium 3.7; Sodium 134 08/10/2021: Hemoglobin 10.8; Platelets 282.0   Recent Lipid Panel    Component Value Date/Time   CHOL 122 04/01/2021 0410   TRIG 119 04/01/2021 0410   HDL 38 (L) 04/01/2021 0410   CHOLHDL 3.2 04/01/2021 0410   VLDL 24 04/01/2021 0410   LDLCALC 60 04/01/2021 0410    Physical Exam:    VS:  BP 118/82 (BP Location: Left Arm, Patient Position: Sitting, Cuff Size: Normal)    Pulse 94    Resp 20    Ht 5' 4"  (1.626 m)    Wt 161 lb (73 kg)  LMP  (LMP Unknown)    SpO2 97%    BMI 27.64 kg/m     Wt Readings from Last 3 Encounters:  08/24/21 161 lb (73 kg)  08/10/21 159 lb 3.2 oz (72.2 kg)  07/01/21 152 lb (68.9 kg)    GEN: Well nourished, well developed in no acute distress HEENT: Normal, moist mucous membranes NECK: No JVD CARDIAC: ***irregularly irregular rhythm, normal S1 and S2, no rubs or gallops. No murmur. VASCULAR: Radial and DP pulses 2+ bilaterally. No carotid bruits RESPIRATORY:  Largely clear ABDOMEN: Soft, non-tender, non-distended MUSCULOSKELETAL:  Ambulates independently SKIN: Warm and dry, ***trivial LLE edema. No redness or warmth. NEUROLOGIC:  Alert and oriented x 3. No focal neuro deficits noted. PSYCHIATRIC:  Normal affect    ASSESSMENT:    No diagnosis found.  PLAN:    Paroxysmal atrial fibrillation: -did convert with cardioversion, but back in afib today -concern that SMA occlusion/mesenteric ischemia may have been embolic. Now on anticoagulation. Cannot afford DOAC  unfortunately, so transitioned to coumadin. Does not have medicare part D -chadsvasc at least 4 -as she did not hold in sinus rhythm post cardioversion, will load with oral amiodarone and reassess  LE edema: improved, will changed to PRN furosemide   Hypercholesterolemia: LDL 60, ok to continue current regimen (simvastatin and ezetimibe). Alternative would be to change to high intensity statin, could then likely drop the ezetimibe and simplify her regimen. Appears she has been on the ezetimibe-simvastatin combination since at least 2008. She wishes to continue her current regimen  Cardiac risk counseling and prevention recommendations: -recommend heart healthy/Mediterranean diet, with whole grains, fruits, vegetable, fish, lean meats, nuts, and olive oil. Limit salt. -recommend moderate walking, 3-5 times/week for 30-50 minutes each session. Aim for at least 150 minutes.week. Goal should be pace of 3 miles/hours, or walking 1.5 miles in 30 minutes -recommend avoidance of tobacco products. Avoid excess alcohol.  Plan for follow up: 6-8 weeks or sooner as needed.  Buford Dresser, MD, PhD, [facility]    Medication Adjustments/Labs and Tests Ordered: Current medicines are reviewed at length with the patient today.  Concerns regarding medicines are outlined above.   No orders of the defined types were placed in this encounter.  No orders of the defined types were placed in this encounter.  Patient Instructions  Medication Instructions:  Your physician recommends that you continue on your current medications as directed. Please refer to the Current Medication list given to you today.  *If you need a refill on your cardiac medications before your next appointment, please call your pharmacy*   Lab Work: None If you have labs (blood work) drawn today and your tests are completely normal, you will receive your results only by: [place] (if you have  MyChart) OR A paper copy in the mail If you have any lab test that is abnormal or we need to change your treatment, we will call you to review the results.   Follow-Up: At [facility], you and your health needs are our priority.  As part of our continuing mission to provide you with exceptional heart care, we have created designated Provider Care Teams.  These Care Teams include your primary Cardiologist (physician) and Advanced Practice Providers (APPs -  Physician Assistants and Nurse Practitioners) who all work together to provide you with the care you need, when you need it.   Your next appointment:   6-8 week(s) 10/08/2021 at 11:20 AM  The format for  your next appointment:   In Person  Provider:   Buford Dresser, MD{  Other Instructions Look for compression stockings to help with the swelling in the feet. I use Dr. Katy Fitch from .          I,Mathew Stumpf,acting as a Education administrator for [employer], MD.,have documented all relevant documentation on the behalf of Buford Dresser, MD,as directed by  Buford Dresser, MD while in the presence of Buford Dresser, MD.  ***   Signed, Buford Dresser, MD PhD 08/24/2021     [place]

## 2021-08-24 NOTE — Patient Instructions (Addendum)
Medication Instructions:  Your physician recommends that you continue on your current medications as directed. Please refer to the Current Medication list given to you today.  *If you need a refill on your cardiac medications before your next appointment, please call your pharmacy*   Lab Work: None If you have labs (blood work) drawn today and your tests are completely normal, you will receive your results only by: [place] (if you have MyChart) OR A paper copy in the mail If you have any lab test that is abnormal or we need to change your treatment, we will call you to review the results.   Follow-Up: At [facility], you and your health needs are our priority.  As part of our continuing mission to provide you with exceptional heart care, we have created designated Provider Care Teams.  These Care Teams include your primary Cardiologist (physician) and Advanced Practice Providers (APPs -  Physician Assistants and Nurse Practitioners) who all work together to provide you with the care you need, when you need it.   Your next appointment:   6-8 week(s) 10/08/2021 at 11:20 AM  The format for your next appointment:   In Person  Provider:   Buford Dresser, MD{  Other Instructions Look for compression stockings to help with the swelling in the feet. I use Dr. Katy Fitch from [place].

## 2021-08-27 ENCOUNTER — Ambulatory Visit: Payer: Medicare Other | Admitting: Nurse Practitioner

## 2021-08-27 NOTE — Telephone Encounter (Signed)
Per Dr. Alain Marion, proceed with Prolia therapy. VOB initiated.

## 2021-08-30 ENCOUNTER — Ambulatory Visit (INDEPENDENT_AMBULATORY_CARE_PROVIDER_SITE_OTHER): Payer: Medicare Other | Admitting: Cardiology

## 2021-08-30 DIAGNOSIS — E039 Hypothyroidism, unspecified: Secondary | ICD-10-CM | POA: Diagnosis not present

## 2021-08-30 DIAGNOSIS — E785 Hyperlipidemia, unspecified: Secondary | ICD-10-CM | POA: Diagnosis not present

## 2021-08-30 DIAGNOSIS — I480 Paroxysmal atrial fibrillation: Secondary | ICD-10-CM | POA: Diagnosis not present

## 2021-08-30 DIAGNOSIS — H8109 Meniere's disease, unspecified ear: Secondary | ICD-10-CM | POA: Diagnosis not present

## 2021-08-30 DIAGNOSIS — Z5181 Encounter for therapeutic drug level monitoring: Secondary | ICD-10-CM

## 2021-08-30 DIAGNOSIS — I080 Rheumatic disorders of both mitral and aortic valves: Secondary | ICD-10-CM | POA: Diagnosis not present

## 2021-08-30 DIAGNOSIS — I10 Essential (primary) hypertension: Secondary | ICD-10-CM | POA: Diagnosis not present

## 2021-08-30 LAB — POCT INR: INR: 2.3 (ref 2.0–3.0)

## 2021-09-03 DIAGNOSIS — I480 Paroxysmal atrial fibrillation: Secondary | ICD-10-CM | POA: Diagnosis not present

## 2021-09-03 DIAGNOSIS — I080 Rheumatic disorders of both mitral and aortic valves: Secondary | ICD-10-CM | POA: Diagnosis not present

## 2021-09-03 DIAGNOSIS — I10 Essential (primary) hypertension: Secondary | ICD-10-CM | POA: Diagnosis not present

## 2021-09-03 DIAGNOSIS — R413 Other amnesia: Secondary | ICD-10-CM | POA: Diagnosis not present

## 2021-09-03 DIAGNOSIS — H8109 Meniere's disease, unspecified ear: Secondary | ICD-10-CM | POA: Diagnosis not present

## 2021-09-03 DIAGNOSIS — E785 Hyperlipidemia, unspecified: Secondary | ICD-10-CM | POA: Diagnosis not present

## 2021-09-03 DIAGNOSIS — M810 Age-related osteoporosis without current pathological fracture: Secondary | ICD-10-CM | POA: Diagnosis not present

## 2021-09-03 DIAGNOSIS — E039 Hypothyroidism, unspecified: Secondary | ICD-10-CM | POA: Diagnosis not present

## 2021-09-03 DIAGNOSIS — Z932 Ileostomy status: Secondary | ICD-10-CM | POA: Diagnosis not present

## 2021-09-03 DIAGNOSIS — Z86718 Personal history of other venous thrombosis and embolism: Secondary | ICD-10-CM | POA: Diagnosis not present

## 2021-09-03 DIAGNOSIS — Z5181 Encounter for therapeutic drug level monitoring: Secondary | ICD-10-CM | POA: Diagnosis not present

## 2021-09-03 DIAGNOSIS — B351 Tinea unguium: Secondary | ICD-10-CM | POA: Diagnosis not present

## 2021-09-03 DIAGNOSIS — Z9181 History of falling: Secondary | ICD-10-CM | POA: Diagnosis not present

## 2021-09-03 DIAGNOSIS — Z7901 Long term (current) use of anticoagulants: Secondary | ICD-10-CM | POA: Diagnosis not present

## 2021-09-06 ENCOUNTER — Ambulatory Visit (INDEPENDENT_AMBULATORY_CARE_PROVIDER_SITE_OTHER): Payer: Medicare Other | Admitting: Cardiology

## 2021-09-06 DIAGNOSIS — E039 Hypothyroidism, unspecified: Secondary | ICD-10-CM | POA: Diagnosis not present

## 2021-09-06 DIAGNOSIS — I480 Paroxysmal atrial fibrillation: Secondary | ICD-10-CM

## 2021-09-06 DIAGNOSIS — I10 Essential (primary) hypertension: Secondary | ICD-10-CM | POA: Diagnosis not present

## 2021-09-06 DIAGNOSIS — Z5181 Encounter for therapeutic drug level monitoring: Secondary | ICD-10-CM

## 2021-09-06 DIAGNOSIS — H8109 Meniere's disease, unspecified ear: Secondary | ICD-10-CM | POA: Diagnosis not present

## 2021-09-06 DIAGNOSIS — E785 Hyperlipidemia, unspecified: Secondary | ICD-10-CM | POA: Diagnosis not present

## 2021-09-06 DIAGNOSIS — I080 Rheumatic disorders of both mitral and aortic valves: Secondary | ICD-10-CM | POA: Diagnosis not present

## 2021-09-06 LAB — POCT INR: INR: 2.2 (ref 2.0–3.0)

## 2021-09-08 ENCOUNTER — Encounter: Payer: Self-pay | Admitting: Nurse Practitioner

## 2021-09-08 ENCOUNTER — Ambulatory Visit (INDEPENDENT_AMBULATORY_CARE_PROVIDER_SITE_OTHER): Payer: Medicare Other | Admitting: Nurse Practitioner

## 2021-09-08 ENCOUNTER — Other Ambulatory Visit: Payer: Self-pay | Admitting: Cardiology

## 2021-09-08 ENCOUNTER — Other Ambulatory Visit (INDEPENDENT_AMBULATORY_CARE_PROVIDER_SITE_OTHER): Payer: Medicare Other

## 2021-09-08 VITALS — BP 122/62 | HR 100 | Ht 64.0 in | Wt 159.8 lb

## 2021-09-08 DIAGNOSIS — D509 Iron deficiency anemia, unspecified: Secondary | ICD-10-CM

## 2021-09-08 DIAGNOSIS — Z7901 Long term (current) use of anticoagulants: Secondary | ICD-10-CM | POA: Diagnosis not present

## 2021-09-08 DIAGNOSIS — K51818 Other ulcerative colitis with other complication: Secondary | ICD-10-CM

## 2021-09-08 DIAGNOSIS — I480 Paroxysmal atrial fibrillation: Secondary | ICD-10-CM

## 2021-09-08 LAB — CBC
HCT: 36 % (ref 36.0–46.0)
Hemoglobin: 11.1 g/dL — ABNORMAL LOW (ref 12.0–15.0)
MCHC: 30.8 g/dL (ref 30.0–36.0)
MCV: 70 fl — ABNORMAL LOW (ref 78.0–100.0)
Platelets: 271 10*3/uL (ref 150.0–400.0)
RBC: 5.15 Mil/uL — ABNORMAL HIGH (ref 3.87–5.11)
RDW: 22.5 % — ABNORMAL HIGH (ref 11.5–15.5)
WBC: 6.6 10*3/uL (ref 4.0–10.5)

## 2021-09-08 LAB — FERRITIN: Ferritin: 7.9 ng/mL — ABNORMAL LOW (ref 10.0–291.0)

## 2021-09-08 MED ORDER — NA SULFATE-K SULFATE-MG SULF 17.5-3.13-1.6 GM/177ML PO SOLN: 1.0000 | ORAL | 0 refills | Status: DC

## 2021-09-08 NOTE — Progress Notes (Signed)
ASSESSMENT AND PLAN     # 88 yo female with anemia and new microcytosis. 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 She was having blood in ostomy at the time. Other than an isolated episode of what sounds like peristomal bleeding in December, she has not had any overt GI bleeding.  Hgb has remained stable at 10.8 but with a declining MCV.  Unclear why her MCV has continued to decline.  Perhaps she is severely iron deficient . Her use of NSAIDS is unclear but she has had no dark stools.   --Patient is of advanced age with comorbidities and need for anticoagulation.  She is at increased risk for procedures.  However, she does need an anemia work-up.  Pending my discussion with Dr. Lorenso Courier I am going to arrange for an EGD and ileoscopy to be done at the [facility].  The risks and benefits of EGD and ileoscopy with possible biopsies were discussed and the patient and her niece. Patient agrees to proceed.  --Ideally she will need to be off coumadin for procedures     # AFIB, s/p ablation November 2022. Went back in Afib. On coumadin now. Niece says Cardiology wants to perform another ablation at some point.  --Hold Coumadinx for 5 days before procedure - will instruct when and how to resume after procedure. Patient understands that there is a low but real risk of cardiovascular event such as heart attack, stroke, or embolism /  thrombosis, or ischemia while off Coumadin. The patient consents to proceed. Will communicate by phone or EMR with patient's prescribing provider to confirm that holding Coumadin is reasonable in this case.  [name]   HISTORY OF PRESENT ILLNESS     Chief Complaint : blood in ostomy  Jasmine Mccormick is a 88 y.o. female with a past medical history significant for osteoporosis, HTN, hypothyroidism, COVID19, UC s/p remote ileostomy, AFIB s/p cardioversion Nov 2022,  SMA occlusion s/p thrombectomy, large hiatal hernia by CT scan.  See PMH below for any additional history.   Patient's husband passed away in February 17, 2023. Niece Jackelyn Poling is caretaker and here with her today.  Patient referred by PCP for blood in stool.   Patient has a very remote history of UC s/p colectomy with end ileostomy > 20 year ago.  She used to be followed by Dr. Sharlett Iles .   August 2022-  hospitalized with mesenteric ischemia.   Imaging showed mid segment SMA occlusion with distal reconstitution.  No radiographic signs of bowel ischemia at the time of scan in ED. she underwent mechanical thrombectomy of the SMA.  She was left intubated for a while due to the risk of progression to bowel ischemia.  Condition improved, she was extubated.  She developed PAF during that admission, started on anticoagulant.   She underwent cardioversion and converted but went back into Afib. Couldn't afford DOAC so on coumadin.  Cardiology thinking about another cardioversion   INTERVAL HISTORY  Patient says that since being in the hospital in August she has not had any blood in her ostomy output.  Sometime in December she did have some red blood coming from her stoma but believes it was from peristomal irritation rather than coming from the bowel lumen .  She has not seen any blood since December .  No black stools  Her ostomy output is unchanged as far as consistency, amount and  color. INR 2.2.   Patient says she has no nausea or vomiting. No abdominal pain. She takes Aleve at night but cannot tell me how often she takes it ( cannot give a ballpark figure).  Her appetite is good. No rectal discharge.   Data Reviewed:  CBC Latest Ref Rng & Units 08/10/2021 06/08/2021 06/01/2021  WBC 4.0 - 10.5 K/uL 6.6 8.5 7.8  Hemoglobin 12.0 - 15.0 g/dL 10.8(L) 10.6(L) 10.6(L)  Hematocrit 36.0 - 46.0 % 34.9(L) 33.9(L) 34.6  Platelets 150.0 - 400.0 K/uL 282.0 355 331    Lab Results  Component Value Date   LIPASE 50  03/28/2021   CMP Latest Ref Rng & Units 06/08/2021 06/01/2021 04/01/2021  Glucose 70 - 99 mg/dL 88 92 99  BUN 8 - 27 mg/dL 15 21 16   Creatinine 0.57 - 1.00 mg/dL 0.95 1.10(H) 0.93  Sodium 134 - 144 mmol/L 134 137 132(L)  Potassium 3.5 - 5.2 mmol/L 3.7 4.9 4.1  Chloride 96 - 106 mmol/L 94(L) 101 99  CO2 20 - 29 mmol/L 26 16(L) 24  Calcium 8.7 - 10.3 mg/dL 10.1 9.9 8.5(L)  Total Protein 6.5 - 8.1 g/dL - - -  Total Bilirubin 0.3 - 1.2 mg/dL - - -  Alkaline Phos 38 - 126 U/L - - -  AST 15 - 41 U/L - - -  ALT 0 - 44 U/L - - -    CHA2DS2-VASc Score = 4   This indicates a 4.8% annual risk of stroke. The patient's score is based upon: CHF History: 0 HTN History: 1 Diabetes History: 0 Stroke History: 0 Vascular Disease History: 0 Age Score: 2 Gender Score: 1    Past Medical History:  Diagnosis Date   COVID 12/18/2020   mild   HTN (hypertension)    Normal at home   Hyperlipidemia    Hypothyroidism    Ileostomy in place [facility])    Memory loss    mild but not dx by doctor per Neice   Meniere disease 1996   Osteoporosis    UC (ulcerative colitis) ([place])      Past Surgical History:  Procedure Laterality Date   CARDIOVERSION N/A 06/15/2021   Procedure: CARDIOVERSION;  Surgeon: Werner Lean, MD;  Location: [place];  Service: Cardiovascular;  Laterality: N/A;   COLECTOMY     COLONOSCOPY     Ear Shunt for Menier's     ENDARTERECTOMY MESENTERIC Right 03/29/2021   Procedure: MESENTERIC ARTERY THROMBECTOMY;  Surgeon: Serafina Mitchell, MD;  Location: [place];  Service: Vascular;  Laterality: Right;   ILEOSTOMY     INTRAOPERATIVE ARTERIOGRAM Right 03/29/2021   Procedure: INTRA OPERATIVE AORTOGRAM;  Surgeon: Serafina Mitchell, MD;  Location: [place];  Service: Vascular;  Laterality: Right;   KYPHOPLASTY N/A 01/07/2021   Procedure: Lumbar three Kyphoplasty;  Surgeon: Dawley, Theodoro Doing, DO;  Location: [place];  Service: Neurosurgery;  Laterality: N/A;   PROCTECTOMY     ULTRASOUND  GUIDANCE FOR VASCULAR ACCESS Right 03/29/2021   Procedure: ULTRASOUND GUIDANCE FOR VASCULAR ACCESS;  Surgeon: Serafina Mitchell, MD;  Location[facility];  Service: Vascular;  Laterality: Right;   Family History  Problem Relation Age of Onset   Hypertension Other    Heart disease Other    Breast cancer Sister    Social History   Tobacco Use   Smoking status: Never   Smokeless tobacco: Never  Vaping Use   Vaping Use: Never used  Substance Use Topics   Alcohol use:  No   Drug use: No   Current Outpatient Medications  Medication Sig Dispense Refill   amiodarone (PACERONE) 200 MG tablet Take 1 tablet (200 mg total) by mouth daily. 90 tablet 3   calcium carbonate (TUMS - DOSED IN MG ELEMENTAL CALCIUM) 500 MG chewable tablet Chew 1 tablet by mouth as needed for indigestion or heartburn.     ezetimibe (ZETIA) 10 MG tablet Take 1 tablet (10 mg total) by mouth daily. 90 tablet 1   furosemide (LASIX) 20 MG tablet Take 1 tablet (20 mg total) by mouth daily as needed for fluid. 30 tablet 5   levothyroxine (SYNTHROID) 75 MCG tablet Take 1 tablet (75 mcg total) by mouth daily. 90 tablet 1   metoprolol succinate (TOPROL-XL) 50 MG 24 hr tablet Take 1.5 tablets (75 mg total) by mouth 2 (two) times daily. Take with or immediately following a meal. 90 tablet 2   Multiple Vitamins-Minerals (MULTIVITAMIN,TX-MINERALS) tablet Take 1 tablet by mouth daily.     simvastatin (ZOCOR) 20 MG tablet Take 20 mg by mouth daily.     triamcinolone ointment (KENALOG) 0.1 % Apply 1 application topically 3 (three) times daily as needed. 80 g 3   warfarin (COUMADIN) 3 MG tablet TAKE 1 TABLET DAILY OR AS PRESCRIBED BY CLINIC (Patient taking differently: 3 mg. Take 1 tablet MON-FRI AND 1/2 TAB EVERY OTHER DAY  as prescribed by Clinic) 30 tablet 1   No current facility-administered medications for this visit.   No Known Allergies   Review of Systems:  All systems reviewed and negative except where noted in HPI.    PHYSICAL  EXAM :    Wt Readings from Last 3 Encounters:  09/08/21 159 lb 12.8 oz (72.5 kg)  08/24/21 161 lb (73 kg)  08/10/21 159 lb 3.2 oz (72.2 kg)    BP 122/62    Pulse 100    Ht 5\' 4"  (1.626 m)    Wt 159 lb 12.8 oz (72.5 kg)    LMP  (LMP Unknown)    SpO2 99%    BMI 27.43 kg/m  Constitutional:  Generally well appearing female in no acute distress. Psychiatric: Pleasant. Normal mood and affect. Behavior is normal. EENT: Pupils normal.  Conjunctivae are normal. No scleral icterus. Neck supple.  Cardiovascular: Normal rate, regular rhythm. No edema Pulmonary/chest: Effort normal and breath sounds normal. No wheezing, rales or rhonchi. Abdominal: Soft, nondistended, nontender. Bowel sounds active throughout. There are no masses palpable. Partial view of stoma which looks healthy.  No stool in bag (she just emptied it) Neurological: Alert and oriented to person place and time. Skin: Skin is warm and dry. No rashes noted.  Tye Savoy, NP  09/08/2021, 11:25 AM  Cc:  Referring Provider Plotnikov, Evie Lacks, MD

## 2021-09-08 NOTE — Patient Instructions (Signed)
PROCEDURES: You have been scheduled for an EGD and Colonoscopy. Please follow the written instructions given to you at your visit today. Please pick up your prep supplies at the pharmacy within the next 1-3 days. If you use inhalers (even only as needed), please bring them with you on the day of your procedure.  LABS:  Lab work has been ordered for you today. Our lab is located in the basement. Press "B" on the elevator. The lab is located at the first door on the left as you exit the elevator.  HEALTHCARE LAWS AND MY CHART RESULTS:  Due to recent changes in healthcare laws, you may see the results of your imaging and laboratory studies on MyChart before your provider has had a chance to review them.   We understand that in some cases there may be results that are confusing or concerning to you. Not all laboratory results come back in the same time frame and the provider may be waiting for multiple results in order to interpret others.  Please give [name] 48 hours in order for your provider to thoroughly review all the results before contacting the office for clarification of your results.   You will be contacted by our office prior to your procedure for directions on holding your Coumadin.  If you do not hear from our office 1 week prior to your scheduled procedure, please call [phone] to discuss.   It was great seeing you today! Thank you for entrusting me with your care and choosing [facility].  Tye Savoy, NP  The [place] [facility] providers would like to encourage you to use [facility] to communicate with providers for non-urgent requests or questions.  Due to long hold times on the telephone, sending your provider a message by [facility] may be faster and more efficient way to get a response. Please allow 48 business hours for a response.  Please remember that this is for non-urgent requests/questions.  If you are age 37 or older, your body mass index should be between 23-30. Your  Body mass index is 27.43 kg/m. If this is out of the aforementioned range listed, please consider follow up with your Primary Care Provider.  If you are age 62 or younger, your body mass index should be between 19-25. Your Body mass index is 27.43 kg/m. If this is out of the aformentioned range listed, please consider follow up with your Primary Care Provider.

## 2021-09-08 NOTE — Telephone Encounter (Signed)
Coumadin Refill

## 2021-09-09 LAB — IRON AND TIBC
Iron Saturation: 7 % — CL (ref 15–55)
Iron: 33 ug/dL (ref 27–139)
Total Iron Binding Capacity: 463 ug/dL — ABNORMAL HIGH (ref 250–450)
UIBC: 430 ug/dL — ABNORMAL HIGH (ref 118–369)

## 2021-09-09 NOTE — Progress Notes (Signed)
I agree with the assessment and plan as outlined by Ms. Guenther. It appears that the patient's IDA is stable at this time. Recommend starting patient on daily oral iron supplementation due to significantly low ferritin. Low threshold to refer patient to hematology for consideration of IV iron infusion if patient is not able to tolerate oral iron therapy. I would start patient empirically on omeprazole 40 mg BID for 8 weeks and have patient can come back to GI clinic in 2 months to see how she is doing at that time. Would hold off on GI procedures for now since with her advanced age and comorbidities, sedation would higher risk for her. Can certainly re-evaluate the need for EGD/ileoscopy if patient has dropping hemoglobin or worsened signs of gross GI bleeding.

## 2021-09-10 ENCOUNTER — Encounter (HOSPITAL_BASED_OUTPATIENT_CLINIC_OR_DEPARTMENT_OTHER): Payer: Self-pay

## 2021-09-10 ENCOUNTER — Other Ambulatory Visit: Payer: Self-pay

## 2021-09-10 ENCOUNTER — Telehealth: Payer: Self-pay

## 2021-09-10 MED ORDER — FERROUS SULFATE 325 (65 FE) MG PO TABS: 325.0000 mg | ORAL_TABLET | Freq: Every day | ORAL | 3 refills | Status: DC

## 2021-09-10 MED ORDER — APIXABAN 5 MG PO TABS: 5.0000 mg | ORAL_TABLET | Freq: Two times a day (BID) | ORAL | 3 refills | Status: DC

## 2021-09-10 MED ORDER — OMEPRAZOLE 40 MG PO CPDR: 40.0000 mg | DELAYED_RELEASE_CAPSULE | Freq: Two times a day (BID) | ORAL | 3 refills | Status: DC

## 2021-09-10 NOTE — Telephone Encounter (Signed)
Spoke with Jasmine Mccormick. Discussed the change of the care plan. Discussed purpose of Omeprazole. Discussed combined use of warfarin with omeprazole can potentially enhance the anticoagulant effect of warfarin. The patient is due her next PT/INR by home health nurse on Monday 09/13/21. Reaching out to the pharmacist for direction on taking Omeprazole and iron in combination with her other medications. [name] is concerned about safety. Specifically, can the warfarin and omeprazole be taken at the time.

## 2021-09-10 NOTE — Telephone Encounter (Signed)
Help.

## 2021-09-10 NOTE — Telephone Encounter (Signed)
-----   Message from Willia Craze, NP sent at 09/09/2021  5:53 PM EST ----- Eustaquio Maize,  I saw this patient with her niece in clinic yesterday for evaluation of blood in ostomy and anemia.  She is at increased risk for procedures so my plan was to talk to Dr. Lorenso Courier to make a decision.  While she was here I went ahead and scheduled the procedures knowing that they may need to be canceled .   I tried to call Neoma Laming, the niece this evening but did not get her.  Would you mind calling Neoma Laming and telling her that I spoke with Dr. Lorenso Courier.  She agrees that patient is at increased risk for procedures especially since we would need to hold Coumadin.  Her recommendations are as follows  1.  Recommend starting patient on daily oral iron supplementation due to significantly low ferritin.   2. I would start patient empirically on omeprazole 40 mg BID for 8 weeks and have patient  come back to GI clinic in 2 months to see how she is doing at that time.  3. Would hold off on GI procedures for now since with her advanced age and comorbidities, sedation would higher risk for her. Can certainly re-evaluate the need for EGD/ileoscopy if patient has dropping hemoglobin or worsened signs of gross GI bleeding.  The niece should be fine with this but if not I am happy to talk with her.  Please cancel EGD / colon if you will.  Thanks

## 2021-09-10 NOTE — Telephone Encounter (Signed)
Consulted with Pharmacy team on best way to get cost of copay, then consulted with Laurann Montana, NP to get Eliquis dosing.    RN to call pharmacy to get copay cost for patient to decide how we should proceed. RN waited 13 minutes on hold. Will call again on Monday morning    [facility]?  Also check at [facility] drug!     Called patient and told them not to pick up the Eliquis till I call them back. Patient endorses understanding and will wait to hear back from [name] on Monday with a plan!   Update from patient's niece:  [facility] is also starting her on a medication, to help raise hemoglobin. They also want to put her on an iron supplement! This medication cannot be taken with the coumadin! They are okay with her taking Eliquis in combination though!

## 2021-09-10 NOTE — Telephone Encounter (Signed)
Rx Eliquis 5mg  BID to preferred pharmacy.   Loel Dubonnet, NP

## 2021-09-10 NOTE — Telephone Encounter (Signed)
Good afternoon, Dr. Harrell Gave asked me to forward this to you all. They tried to get the patient on Eliquis while in the hospital but her Copay was quite crazy! Can we please look into Eliquis/ Xarelto for her?

## 2021-09-10 NOTE — Telephone Encounter (Signed)
I received a call from Leighton [name] that the patient has been started on Omeprazole by her GI physician.   She would like to check her INR on 1/30, which I told her that will be fine.

## 2021-09-11 NOTE — Telephone Encounter (Signed)
Pt ready for scheduling on or after 02/09/22  Out-of-pocket cost due at time of visit: $0  Primary: Medicare [place] co-insurance: 20% (approximately $276) Admin fee co-insurance: 20% (approximately $25)  Secondary: [employer] [place] co-insurance: Covers Medicare Part B co-insurance and deductible.  Admin fee co-insurance: Covers Medicare Part B co-insurance and deductible.   Deductible: covered by secondary  Prior Auth: not required PA# Valid:   ** This summary of benefits is an estimation of the patient's out-of-pocket cost. Exact cost may vary based on individual plan coverage.

## 2021-09-13 ENCOUNTER — Ambulatory Visit (INDEPENDENT_AMBULATORY_CARE_PROVIDER_SITE_OTHER): Payer: Medicare Other | Admitting: Podiatry

## 2021-09-13 ENCOUNTER — Ambulatory Visit (INDEPENDENT_AMBULATORY_CARE_PROVIDER_SITE_OTHER): Payer: Medicare Other | Admitting: Cardiology

## 2021-09-13 ENCOUNTER — Encounter: Payer: Self-pay | Admitting: Podiatry

## 2021-09-13 ENCOUNTER — Other Ambulatory Visit: Payer: Self-pay

## 2021-09-13 ENCOUNTER — Telehealth (HOSPITAL_BASED_OUTPATIENT_CLINIC_OR_DEPARTMENT_OTHER): Payer: Self-pay

## 2021-09-13 DIAGNOSIS — B351 Tinea unguium: Secondary | ICD-10-CM | POA: Diagnosis not present

## 2021-09-13 DIAGNOSIS — I080 Rheumatic disorders of both mitral and aortic valves: Secondary | ICD-10-CM | POA: Diagnosis not present

## 2021-09-13 DIAGNOSIS — M79674 Pain in right toe(s): Secondary | ICD-10-CM | POA: Diagnosis not present

## 2021-09-13 DIAGNOSIS — E039 Hypothyroidism, unspecified: Secondary | ICD-10-CM | POA: Diagnosis not present

## 2021-09-13 DIAGNOSIS — H8109 Meniere's disease, unspecified ear: Secondary | ICD-10-CM | POA: Diagnosis not present

## 2021-09-13 DIAGNOSIS — M79675 Pain in left toe(s): Secondary | ICD-10-CM

## 2021-09-13 DIAGNOSIS — E785 Hyperlipidemia, unspecified: Secondary | ICD-10-CM | POA: Diagnosis not present

## 2021-09-13 DIAGNOSIS — I480 Paroxysmal atrial fibrillation: Secondary | ICD-10-CM | POA: Diagnosis not present

## 2021-09-13 DIAGNOSIS — Z5181 Encounter for therapeutic drug level monitoring: Secondary | ICD-10-CM | POA: Diagnosis not present

## 2021-09-13 DIAGNOSIS — I10 Essential (primary) hypertension: Secondary | ICD-10-CM | POA: Diagnosis not present

## 2021-09-13 LAB — POCT INR: INR: 2.7 (ref 2.0–3.0)

## 2021-09-13 NOTE — Telephone Encounter (Addendum)
Sent mychart message to patient's niece to see I they have any other insurance. Will addend this note after speaking with her! Patients niece sent me insurance Card, the patient only has a Scientist, research (life sciences) through [employer] # [id number] Plan: F Effective Date: 04/15/2016  Copay Cost for Eliquis is around $1,700 per month    Will forward to Dr.Christopher and Pharmacy team for any further ideas. Please follow up with patient and let her know whether or not to pick up the Eliquis. It will need to be removed from medication list if we cannot make it more affordable for the patient!   Bringing forward note from 1/27  Consulted with Pharmacy team on best way to get cost of copay, then consulted with Laurann Montana, NP to get Eliquis dosing.      RN to call pharmacy to get copay cost for patient to decide how we should proceed. RN waited 13 minutes on hold. Will call again on Monday morning      [facility]  Also check at [facility] drug!        Called patient and told them not to pick up the Eliquis till I call them back. Patient endorses understanding and will wait to hear back from [name] on Monday with a plan!    Update from patient's niece:  [facility] is also starting her on a medication, to help raise hemoglobin. They also want to put her on an iron supplement! This medication cannot be taken with the coumadin! They are okay with her taking Eliquis in combination though!

## 2021-09-13 NOTE — Telephone Encounter (Signed)
Sounds like they may have provided a quote for a 3 month supply. I see a GI note that mentions her taking omeprazole? This is fine to take with warfarin if the pt assistance for Eliquis gets denied, just need to keep an eye on her INR (which we would routinely anyway) since omeprazole can increase the INR.

## 2021-09-13 NOTE — Telephone Encounter (Addendum)
We still do not have any prescription insurance scanned in for pt. Her supplement plan does not cover medications she picks up at the pharmacy. $1700/month for Eliquis is not correct though, the cash price is ~$500/month. Pharmacy should be able to process rx through any active prescription insurance. If she does not have any, she can fill out pt assistance for Eliquis through [employer] for uninsured patients. She does qualify for a Medicare D supplement plan though, so if they do approve her this year they typically will not in future years and will request that she purchase a Part D plan which would likely be expensive (I believe there's a penalty for every year a patient signs up late after age 101). Other option would be to change to warfarin since this is cheap without prescription insurance.

## 2021-09-16 NOTE — Progress Notes (Signed)
Subjective: Jasmine Mccormick is a 88 y.o. female patient seen today for follow up of  painful elongated mycotic toenails 1-5 bilaterally which are tender when wearing enclosed shoe gear. Pain is relieved with periodic professional debridement..   New problem(s)/concern(s) today: None    PCP is Plotnikov, Evie Lacks, MD. Last visit was: 08/10/2021.  No Known Allergies  Objective: Physical Exam  General: Patient is a pleasant 88 y.o. Caucasian female WD, WN in NAD. AAO x 3.   Neurovascular Examination: CFT <3 seconds b/l LE. Palpable DP/PT pulses b/l LE. Digital hair sparse b/l. Skin temperature gradient WNL b/l. No pain with calf compression b/l. No edema noted b/l. No cyanosis or clubbing noted b/l LE.  Protective sensation intact 5/5 intact bilaterally with 10g monofilament b/l.  Dermatological:  No open wounds b/l LE. No interdigital macerations noted b/l LE. Toenails 1-5 bilaterally elongated, discolored, dystrophic, thickened, and crumbly with subungual debris and tenderness to dorsal palpation.  Musculoskeletal:  Normal muscle strength 5/5 to all lower extremity muscle groups bilaterally. No pain, crepitus or joint limitation noted with ROM b/l LE. No gross bony pedal deformities b/l. Patient ambulates independently without assistive aids.  Assessment: 1. Pain due to onychomycosis of toenails of both feet    Plan: Patient was evaluated and treated and all questions answered. Consent given for treatment as described below: -Toenails 1-5 b/l were debrided in length and girth with sterile nail nippers and dremel without iatrogenic bleeding.  -Patient/POA to call should there be question/concern in the interim.  Return in 2 months (on 11/15/2021).  Marzetta Board, DPM

## 2021-09-17 ENCOUNTER — Ambulatory Visit (INDEPENDENT_AMBULATORY_CARE_PROVIDER_SITE_OTHER): Payer: Medicare Other | Admitting: Cardiology

## 2021-09-17 DIAGNOSIS — I10 Essential (primary) hypertension: Secondary | ICD-10-CM | POA: Diagnosis not present

## 2021-09-17 DIAGNOSIS — H8109 Meniere's disease, unspecified ear: Secondary | ICD-10-CM | POA: Diagnosis not present

## 2021-09-17 DIAGNOSIS — I080 Rheumatic disorders of both mitral and aortic valves: Secondary | ICD-10-CM | POA: Diagnosis not present

## 2021-09-17 DIAGNOSIS — E039 Hypothyroidism, unspecified: Secondary | ICD-10-CM | POA: Diagnosis not present

## 2021-09-17 DIAGNOSIS — E785 Hyperlipidemia, unspecified: Secondary | ICD-10-CM | POA: Diagnosis not present

## 2021-09-17 DIAGNOSIS — I480 Paroxysmal atrial fibrillation: Secondary | ICD-10-CM | POA: Diagnosis not present

## 2021-09-17 DIAGNOSIS — Z5181 Encounter for therapeutic drug level monitoring: Secondary | ICD-10-CM

## 2021-09-17 LAB — POCT INR: INR: 3.3 — AB (ref 2.0–3.0)

## 2021-09-20 DIAGNOSIS — Z20822 Contact with and (suspected) exposure to covid-19: Secondary | ICD-10-CM | POA: Diagnosis not present

## 2021-09-22 DIAGNOSIS — Z20822 Contact with and (suspected) exposure to covid-19: Secondary | ICD-10-CM | POA: Diagnosis not present

## 2021-09-24 ENCOUNTER — Ambulatory Visit (INDEPENDENT_AMBULATORY_CARE_PROVIDER_SITE_OTHER): Payer: Medicare Other | Admitting: Pharmacist

## 2021-09-24 ENCOUNTER — Telehealth: Payer: Self-pay

## 2021-09-24 DIAGNOSIS — I080 Rheumatic disorders of both mitral and aortic valves: Secondary | ICD-10-CM | POA: Diagnosis not present

## 2021-09-24 DIAGNOSIS — I4819 Other persistent atrial fibrillation: Secondary | ICD-10-CM

## 2021-09-24 DIAGNOSIS — I10 Essential (primary) hypertension: Secondary | ICD-10-CM | POA: Diagnosis not present

## 2021-09-24 DIAGNOSIS — I480 Paroxysmal atrial fibrillation: Secondary | ICD-10-CM | POA: Diagnosis not present

## 2021-09-24 DIAGNOSIS — E785 Hyperlipidemia, unspecified: Secondary | ICD-10-CM | POA: Diagnosis not present

## 2021-09-24 DIAGNOSIS — E039 Hypothyroidism, unspecified: Secondary | ICD-10-CM | POA: Diagnosis not present

## 2021-09-24 DIAGNOSIS — H8109 Meniere's disease, unspecified ear: Secondary | ICD-10-CM | POA: Diagnosis not present

## 2021-09-24 LAB — POCT INR: INR: 2.5 (ref 2.0–3.0)

## 2021-09-24 NOTE — Telephone Encounter (Signed)
Lp's niece message regarding INR result/recommendation.  Told to call, if questions.

## 2021-09-24 NOTE — Patient Instructions (Signed)
Description   Spoke to Judson Roch [facility] RN advising her to have pt decrease to 0.5 tablet daily except for 1  tablet on Monday.   Repeat INR in 1 week; omeprazole started lately;

## 2021-09-25 ENCOUNTER — Other Ambulatory Visit (HOSPITAL_BASED_OUTPATIENT_CLINIC_OR_DEPARTMENT_OTHER): Payer: Self-pay | Admitting: Family

## 2021-09-25 DIAGNOSIS — I480 Paroxysmal atrial fibrillation: Secondary | ICD-10-CM

## 2021-10-01 ENCOUNTER — Ambulatory Visit (INDEPENDENT_AMBULATORY_CARE_PROVIDER_SITE_OTHER): Payer: Medicare Other | Admitting: Cardiovascular Disease

## 2021-10-01 ENCOUNTER — Other Ambulatory Visit: Payer: Self-pay | Admitting: Cardiology

## 2021-10-01 DIAGNOSIS — I080 Rheumatic disorders of both mitral and aortic valves: Secondary | ICD-10-CM | POA: Diagnosis not present

## 2021-10-01 DIAGNOSIS — I4819 Other persistent atrial fibrillation: Secondary | ICD-10-CM | POA: Diagnosis not present

## 2021-10-01 DIAGNOSIS — H8109 Meniere's disease, unspecified ear: Secondary | ICD-10-CM | POA: Diagnosis not present

## 2021-10-01 DIAGNOSIS — Z5181 Encounter for therapeutic drug level monitoring: Secondary | ICD-10-CM

## 2021-10-01 DIAGNOSIS — E039 Hypothyroidism, unspecified: Secondary | ICD-10-CM | POA: Diagnosis not present

## 2021-10-01 DIAGNOSIS — E785 Hyperlipidemia, unspecified: Secondary | ICD-10-CM | POA: Diagnosis not present

## 2021-10-01 DIAGNOSIS — I10 Essential (primary) hypertension: Secondary | ICD-10-CM | POA: Diagnosis not present

## 2021-10-01 DIAGNOSIS — I480 Paroxysmal atrial fibrillation: Secondary | ICD-10-CM

## 2021-10-01 LAB — POCT INR: INR: 2.5 (ref 2.0–3.0)

## 2021-10-03 DIAGNOSIS — B351 Tinea unguium: Secondary | ICD-10-CM | POA: Diagnosis not present

## 2021-10-03 DIAGNOSIS — H8109 Meniere's disease, unspecified ear: Secondary | ICD-10-CM | POA: Diagnosis not present

## 2021-10-03 DIAGNOSIS — Z5181 Encounter for therapeutic drug level monitoring: Secondary | ICD-10-CM | POA: Diagnosis not present

## 2021-10-03 DIAGNOSIS — I10 Essential (primary) hypertension: Secondary | ICD-10-CM | POA: Diagnosis not present

## 2021-10-03 DIAGNOSIS — Z932 Ileostomy status: Secondary | ICD-10-CM | POA: Diagnosis not present

## 2021-10-03 DIAGNOSIS — I480 Paroxysmal atrial fibrillation: Secondary | ICD-10-CM | POA: Diagnosis not present

## 2021-10-03 DIAGNOSIS — E039 Hypothyroidism, unspecified: Secondary | ICD-10-CM | POA: Diagnosis not present

## 2021-10-03 DIAGNOSIS — Z86718 Personal history of other venous thrombosis and embolism: Secondary | ICD-10-CM | POA: Diagnosis not present

## 2021-10-03 DIAGNOSIS — M810 Age-related osteoporosis without current pathological fracture: Secondary | ICD-10-CM | POA: Diagnosis not present

## 2021-10-03 DIAGNOSIS — I080 Rheumatic disorders of both mitral and aortic valves: Secondary | ICD-10-CM | POA: Diagnosis not present

## 2021-10-03 DIAGNOSIS — Z9181 History of falling: Secondary | ICD-10-CM | POA: Diagnosis not present

## 2021-10-03 DIAGNOSIS — R413 Other amnesia: Secondary | ICD-10-CM | POA: Diagnosis not present

## 2021-10-03 DIAGNOSIS — Z7901 Long term (current) use of anticoagulants: Secondary | ICD-10-CM | POA: Diagnosis not present

## 2021-10-03 DIAGNOSIS — E785 Hyperlipidemia, unspecified: Secondary | ICD-10-CM | POA: Diagnosis not present

## 2021-10-04 DIAGNOSIS — H8109 Meniere's disease, unspecified ear: Secondary | ICD-10-CM | POA: Diagnosis not present

## 2021-10-04 DIAGNOSIS — I480 Paroxysmal atrial fibrillation: Secondary | ICD-10-CM | POA: Diagnosis not present

## 2021-10-04 DIAGNOSIS — E039 Hypothyroidism, unspecified: Secondary | ICD-10-CM | POA: Diagnosis not present

## 2021-10-04 DIAGNOSIS — I10 Essential (primary) hypertension: Secondary | ICD-10-CM | POA: Diagnosis not present

## 2021-10-04 DIAGNOSIS — I080 Rheumatic disorders of both mitral and aortic valves: Secondary | ICD-10-CM | POA: Diagnosis not present

## 2021-10-04 DIAGNOSIS — E785 Hyperlipidemia, unspecified: Secondary | ICD-10-CM | POA: Diagnosis not present

## 2021-10-08 ENCOUNTER — Encounter (HOSPITAL_BASED_OUTPATIENT_CLINIC_OR_DEPARTMENT_OTHER): Payer: Self-pay | Admitting: Cardiology

## 2021-10-08 ENCOUNTER — Ambulatory Visit (INDEPENDENT_AMBULATORY_CARE_PROVIDER_SITE_OTHER): Payer: Medicare Other | Admitting: Cardiology

## 2021-10-08 ENCOUNTER — Other Ambulatory Visit: Payer: Self-pay

## 2021-10-08 VITALS — BP 122/72 | HR 86 | Ht 64.0 in | Wt 155.8 lb

## 2021-10-08 DIAGNOSIS — Z7901 Long term (current) use of anticoagulants: Secondary | ICD-10-CM | POA: Diagnosis not present

## 2021-10-08 DIAGNOSIS — R600 Localized edema: Secondary | ICD-10-CM

## 2021-10-08 DIAGNOSIS — I4819 Other persistent atrial fibrillation: Secondary | ICD-10-CM | POA: Diagnosis not present

## 2021-10-08 DIAGNOSIS — D6869 Other thrombophilia: Secondary | ICD-10-CM | POA: Diagnosis not present

## 2021-10-08 DIAGNOSIS — Z01812 Encounter for preprocedural laboratory examination: Secondary | ICD-10-CM

## 2021-10-08 NOTE — Progress Notes (Signed)
Cardiology Office Note:    Date:  10/08/2021   ID:  Jasmine Mccormick, DOB July 20, 1934, MRN [id number]  PCP:  Cassandria Anger, MD  Cardiologist:  Buford Dresser, MD  Referring MD: Cassandria Anger, MD   CC: follow up  History of Present Illness:    Jasmine Mccormick is a 88 y.o. female with a complex history, including ulcerative colitis s/p colectomy with ileostomy placement >20 years ago, HTN, and hypothyroidism, notable for distal SMA occlusion s/p thrombectomy. Found to have new atrial fibrillation during hospitalization 03/2021.   Cardiac History: She underwent cardioversion on 06/15/2021. She converted to NSR at 74 bpm. However, sinus rhythm did not hold. She was started on amiodarone.  Apixaban was very expensive (does not have medicare part D), followed by coumadin clinic at [facility].   Today: She is accompanied by a family member who provides most of the history. Overall, she is feeling pretty good.   However, she continues to suffer from bilateral LE edema, mostly unchanged but may be worsening lately. She has been taking her diuretic daily. She denies any issues with shortness of breath. When she is able she tries to elevate her legs.  She has not yet switched to Eliquis. Her INR has reportedly been stable.  She denies any palpitations, chest pain, or shortness of breath. No lightheadedness, headaches, syncope, orthopnea, or PND.   Past Medical History:  Diagnosis Date   COVID 12/18/2020   mild   HTN (hypertension)    Normal at home   Hyperlipidemia    Hypothyroidism    Ileostomy in place [facility])    Memory loss    mild but not dx by doctor per [place]   Meniere disease 1996   Osteoporosis    UC (ulcerative colitis) ([place])     Past Surgical History:  Procedure Laterality Date   CARDIOVERSION N/A 06/15/2021   Procedure: CARDIOVERSION;  Surgeon: Werner Lean, MD;  Location: [place];  Service: Cardiovascular;  Laterality: N/A;    COLECTOMY     COLONOSCOPY     Ear Shunt for Menier's     ENDARTERECTOMY MESENTERIC Right 03/29/2021   Procedure: MESENTERIC ARTERY THROMBECTOMY;  Surgeon: Serafina Mitchell, MD;  Location: [place];  Service: Vascular;  Laterality: Right;   ILEOSTOMY     INTRAOPERATIVE ARTERIOGRAM Right 03/29/2021   Procedure: INTRA OPERATIVE AORTOGRAM;  Surgeon: Serafina Mitchell, MD;  Location: [place];  Service: Vascular;  Laterality: Right;   KYPHOPLASTY N/A 01/07/2021   Procedure: Lumbar three Kyphoplasty;  Surgeon: Dawley, Theodoro Doing, DO;  Location: [place];  Service: Neurosurgery;  Laterality: N/A;   PROCTECTOMY     ULTRASOUND GUIDANCE FOR VASCULAR ACCESS Right 03/29/2021   Procedure: ULTRASOUND GUIDANCE FOR VASCULAR ACCESS;  Surgeon: Serafina Mitchell, MD;  Location[facility];  Service: Vascular;  Laterality: Right;    Current Medications: Current Outpatient Medications on File Prior to Visit  Medication Sig   amiodarone (PACERONE) 200 MG tablet Take 1 tablet (200 mg total) by mouth daily.   calcium carbonate (TUMS - DOSED IN MG ELEMENTAL CALCIUM) 500 MG chewable tablet Chew 1 tablet by mouth as needed for indigestion or heartburn.   ferrous sulfate 325 (65 FE) MG tablet Take 1 tablet (325 mg total) by mouth daily with breakfast.   furosemide (LASIX) 20 MG tablet Take 1 tablet (20 mg total) by mouth daily as needed for fluid.   levothyroxine (SYNTHROID) 75 MCG tablet Take 1 tablet (75 mcg total) by mouth  daily.   metoprolol succinate (TOPROL-XL) 50 MG 24 hr tablet TAKE 1.5 TABLETS BY MOUTH 2 (TWO) TIMES DAILY. TAKE WITH OR IMMEDIATELY FOLLOWING A MEAL.   omeprazole (PRILOSEC) 40 MG capsule Take 1 capsule (40 mg total) by mouth 2 (two) times daily before a meal.   simvastatin (ZOCOR) 20 MG tablet Take 20 mg by mouth daily.   triamcinolone ointment (KENALOG) 0.1 % Apply 1 application topically 3 (three) times daily as needed.   warfarin (COUMADIN) 3 MG tablet TAKE 1 TABLET DAILY OR AS PRESCRIBED BY CLINIC   No current  facility-administered medications on file prior to visit.     Allergies:   Patient has no known allergies.   Social History   Tobacco Use   Smoking status: Never   Smokeless tobacco: Never  Vaping Use   Vaping Use: Never used  Substance Use Topics   Alcohol use: No   Drug use: No    Family History: family history includes Breast cancer in her sister; Heart disease in an other family member; Hypertension in an other family member.  ROS:   Please see the history of present illness.   (+) Bilateral LE edema Additional pertinent ROS otherwise unremarkable.   EKGs/Labs/Other Studies Reviewed:    The following studies were reviewed today:  Echocardiogram 03/30/21:   1. Patient in rapid afib during study.   2. Left ventricular ejection fraction, by estimation, is 60 to 65%. The  left ventricle has normal function. The left ventricle has no regional  wall motion abnormalities. Left ventricular diastolic parameters are  indeterminate.   3. Right ventricular systolic function is normal. The right ventricular  size is normal. There is normal pulmonary artery systolic pressure.   4. Left atrial size was moderately dilated.   5. Right atrial size was moderately dilated.   6. The mitral valve is degenerative. Trivial mitral valve regurgitation.  No evidence of mitral stenosis. Moderate mitral annular calcification.   7. The aortic valve is tricuspid. Aortic valve regurgitation is not  visualized. Mild to moderate aortic valve sclerosis/calcification is  present, without any evidence of aortic stenosis.   8. The inferior vena cava is normal in size with greater than 50%  respiratory variability, suggesting right atrial pressure of 3 mmHg.  CTA Abdomen/Pelvis 03/29/2021: IMPRESSION: VASCULAR   1. 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 2. Moderate to marked severity diffuse calcification and atherosclerosis throughout the remaining  arterial structures within the abdomen and pelvis.   NON-VASCULAR   1. 2 mm nonobstructing left renal stone. 2. Postoperative changes with a subsequent right lower quadrant ostomy site. 3. Large hiatal hernia.  EKG:  EKG is personally reviewed.   10/08/2021: atrial fibrillation at 86 bpm 08/24/2021: EKG was not ordered. 07/01/21: afib RVR at 116 bpm 04/15/21: afib RVR at 109 bpm  Recent Labs: 03/17/2021: TSH 9.46 03/31/2021: ALT 15 06/01/2021: BNP 325.9 06/08/2021: BUN 15; Creatinine, Ser 0.95; Potassium 3.7; Sodium 134 09/08/2021: Hemoglobin 11.1; Platelets 271.0   Recent Lipid Panel    Component Value Date/Time   CHOL 122 04/01/2021 0410   TRIG 119 04/01/2021 0410   HDL 38 (L) 04/01/2021 0410   CHOLHDL 3.2 04/01/2021 0410   VLDL 24 04/01/2021 0410   LDLCALC 60 04/01/2021 0410    Physical Exam:    VS:  BP 122/72    Pulse 86    Ht 5' 4"  (1.626 m)    Wt 155 lb 12.8 oz (70.7 kg)  LMP  (LMP Unknown)    SpO2 97%    BMI 26.74 kg/m     Wt Readings from Last 3 Encounters:  10/08/21 155 lb 12.8 oz (70.7 kg)  09/08/21 159 lb 12.8 oz (72.5 kg)  08/24/21 161 lb (73 kg)    GEN: Well nourished, well developed in no acute distress HEENT: Normal, moist mucous membranes NECK: No JVD CARDIAC: irregularly irregular rhythm, normal S1 and S2, no rubs or gallops. No murmur. VASCULAR: Radial and DP pulses 2+ bilaterally. No carotid bruits RESPIRATORY:  Largely clear ABDOMEN: Soft, non-tender, non-distended MUSCULOSKELETAL:  Ambulates independently SKIN: Warm and dry, 2+ LE edema bilaterally, tender on palpation.  NEUROLOGIC:  Alert and oriented x 3. No focal neuro deficits noted. PSYCHIATRIC:  Normal affect    ASSESSMENT:    1. Persistent atrial fibrillation ([place])   2. Pre-procedural laboratory examination   3. Secondary hypercoagulable state ([place])   4. Current use of long term anticoagulation   5. Bilateral leg edema     PLAN:    Paroxysmal atrial fibrillation: now  persistent -did convert with cardioversion, but returned to afib. Started amiodarone, which she is tolerating. We again discussed cardioversion today. Both patient and her POA amenable, wish to schedule with me. Will plan for 10/26/21 -Shared Decision Making/Informed Consent The risks (stroke, cardiac arrhythmias rarely resulting in the need for a temporary or permanent pacemaker, skin irritation or burns and complications associated with conscious sedation including aspiration, arrhythmia, respiratory failure and death), benefits (restoration of normal sinus rhythm) and alternatives of a direct current cardioversion were explained in detail to Ms. Ladson and she agrees to proceed.  -labs ordered today. Has had weekly therapeutic INRs -concern that SMA occlusion/mesenteric ischemia may have been embolic. Now on anticoagulation. Cannot afford DOAC unfortunately, so transitioned to coumadin. Does not have medicare part D. Would be interested in dabigatran when generic -chadsvasc at least 4  LE edema: remains swollen. Doesn't tolerate compression stockings or prolonged elevation per patient. Taking lasix daily. Labs today   Hypercholesterolemia: LDL 60, ok to continue simvastatin only  Cardiac risk counseling and prevention recommendations: -recommend heart healthy/Mediterranean diet, with whole grains, fruits, vegetable, fish, lean meats, nuts, and olive oil. Limit salt. -recommend moderate walking, 3-5 times/week for 30-50 minutes each session. Aim for at least 150 minutes.week. Goal should be pace of 3 miles/hours, or walking 1.5 miles in 30 minutes -recommend avoidance of tobacco products. Avoid excess alcohol.  Plan for follow up: 1-2 weeks following cardioversion 10/26/21.  Buford Dresser, MD, PhD, [place]    Total time of encounter: 42 minutes total time of encounter, including 23 minutes spent in face-to-face patient care. This time includes coordination of  care and counseling regarding atrial fibrillation, LE edema, cardioversion. Remainder of non-face-to-face time involved reviewing chart documents/testing relevant to the patient encounter and documentation in the medical record.  Buford Dresser, MD, PhD, [facility]    Medication Adjustments/Labs and Tests Ordered: Current medicines are reviewed at length with the patient today.  Concerns regarding medicines are outlined above.   Orders Placed This Encounter  Procedures   Protime-INR   Basic Metabolic Panel (BMET)   CBC   Protime-INR   EKG 12-Lead   No orders of the defined types were placed in this encounter.  Patient Instructions  Medication Instructions:  Your physician recommends that you continue on your current medications as directed. Please refer to the Current Medication list given  to you today.  *If you need a refill on your cardiac medications before your next appointment, please call your pharmacy*  Lab Work: CBC/BMET/INR 3 DAYS PRIOR TO CARDIOVERSION   If you have labs (blood work) drawn today and your tests are completely normal, you will receive your results only by: [place] (if you have MyChart) OR A paper copy in the mail If you have any lab test that is abnormal or we need to change your treatment, we will call you to review the results.  Testing/Procedures: CARDIOVERSION   Follow-Up: At [facility], you and your health needs are our priority.  As part of our continuing mission to provide you with exceptional heart care, we have created designated Provider Care Teams.  These Care Teams include your primary Cardiologist (physician) and Advanced Practice Providers (APPs -  Physician Assistants and Nurse Practitioners) who all work together to provide you with the care you need, when you need it.  We recommend signing up for the patient portal called "MyChart".  Sign up information is provided on this After Visit Summary.   MyChart is used to connect with patients for Virtual Visits (Telemedicine).  Patients are able to view lab/test results, encounter notes, upcoming appointments, etc.  Non-urgent messages can be sent to your provider as well.   To learn more about what you can do with MyChart, go to [email or web address].    Your next appointment:   11/10/2021 10:20 AM WITH DR Markelle Najarian    :1}  Other Instructions  You are scheduled for a Cardioversion on 10/26/2021 with Dr. Harrell Gave.  Please arrive at the [facility] (Main Entrance A) at [facility]: [address] [place], [place] [address] at 10/26/2021 8:30 am. (1 hour prior to procedure unless lab work is needed; if lab work is needed arrive 1.5 hours ahead)  DIET: Nothing to eat or drink after midnight except a sip of water with medications (see medication instructions below)  FYI: For your safety, and to allow [name] to monitor your vital signs accurately during the surgery/procedure we request that   if you have artificial nails, gel coating, SNS etc. Please have those removed prior to your surgery/procedure. Not having the nail coverings /polish removed may result in cancellation or delay of your surgery/procedure.   Medication Instructions:  Continue your anticoagulant: WARFARIN  You will need to continue your anticoagulant  after your procedure until you are told by your Provider that it is safe to stop  Labs: GO TO ANY [facility] 3 DAYS PRIOR TO CARDIOVERSION FOR LABS   You must have a responsible person to drive you home and stay in the waiting area during your procedure. Failure to do so could result in cancellation.  Bring your insurance cards.  *Special Note: Every effort is made to have your procedure done on time. Occasionally there are emergencies that occur at the hospital that may cause delays. Please be patient if a delay does occur.    I,Mathew Stumpf,acting as a Education administrator for [employer], MD.,have documented all  relevant documentation on the behalf of Buford Dresser, MD,as directed by  Buford Dresser, MD while in the presence of Buford Dresser, MD.   I, Buford Dresser, MD, have reviewed all documentation for this visit. The documentation on 10/08/21 for the exam, diagnosis, procedures, and orders are all accurate and complete.    Signed, Buford Dresser, MD PhD 10/08/2021     [place]

## 2021-10-08 NOTE — Patient Instructions (Signed)
Medication Instructions:  Your physician recommends that you continue on your current medications as directed. Please refer to the Current Medication list given to you today.  *If you need a refill on your cardiac medications before your next appointment, please call your pharmacy*  Lab Work: CBC/BMET/INR 3 DAYS PRIOR TO CARDIOVERSION   If you have labs (blood work) drawn today and your tests are completely normal, you will receive your results only by: [place] (if you have MyChart) OR A paper copy in the mail If you have any lab test that is abnormal or we need to change your treatment, we will call you to review the results.  Testing/Procedures: CARDIOVERSION   Follow-Up: At [facility], you and your health needs are our priority.  As part of our continuing mission to provide you with exceptional heart care, we have created designated Provider Care Teams.  These Care Teams include your primary Cardiologist (physician) and Advanced Practice Providers (APPs -  Physician Assistants and Nurse Practitioners) who all work together to provide you with the care you need, when you need it.  We recommend signing up for the patient portal called "MyChart".  Sign up information is provided on this After Visit Summary.  MyChart is used to connect with patients for Virtual Visits (Telemedicine).  Patients are able to view lab/test results, encounter notes, upcoming appointments, etc.  Non-urgent messages can be sent to your provider as well.   To learn more about what you can do with MyChart, go to [email or web address].    Your next appointment:   11/10/2021 10:20 AM WITH DR CHRISTOPHER    :1}  Other Instructions  You are scheduled for a Cardioversion on 10/26/2021 with Dr. Harrell Gave.  Please arrive at the [facility] (Main Entrance A) at [facility]: [address] [place], [place] [address] at 10/26/2021 8:30 am. (1 hour prior to procedure unless lab work is needed; if lab work  is needed arrive 1.5 hours ahead)  DIET: Nothing to eat or drink after midnight except a sip of water with medications (see medication instructions below)  FYI: For your safety, and to allow [name] to monitor your vital signs accurately during the surgery/procedure we request that   if you have artificial nails, gel coating, SNS etc. Please have those removed prior to your surgery/procedure. Not having the nail coverings /polish removed may result in cancellation or delay of your surgery/procedure.   Medication Instructions:  Continue your anticoagulant: WARFARIN  You will need to continue your anticoagulant  after your procedure until you are told by your Provider that it is safe to stop  Labs: [facility] TO ANY [facility] 3 DAYS PRIOR TO CARDIOVERSION FOR LABS   You must have a responsible person to drive you home and stay in the waiting area during your procedure. Failure to do so could result in cancellation.  Bring your insurance cards.  *Special Note: Every effort is made to have your procedure done on time. Occasionally there are emergencies that occur at the hospital that may cause delays. Please be patient if a delay does occur.

## 2021-10-12 DIAGNOSIS — Z0184 Encounter for antibody response examination: Secondary | ICD-10-CM | POA: Diagnosis not present

## 2021-10-13 DIAGNOSIS — Z20822 Contact with and (suspected) exposure to covid-19: Secondary | ICD-10-CM | POA: Diagnosis not present

## 2021-10-15 ENCOUNTER — Ambulatory Visit (INDEPENDENT_AMBULATORY_CARE_PROVIDER_SITE_OTHER): Payer: Medicare Other | Admitting: Cardiology

## 2021-10-15 ENCOUNTER — Encounter (HOSPITAL_BASED_OUTPATIENT_CLINIC_OR_DEPARTMENT_OTHER): Payer: Self-pay

## 2021-10-15 DIAGNOSIS — Z5181 Encounter for therapeutic drug level monitoring: Secondary | ICD-10-CM

## 2021-10-15 DIAGNOSIS — I4819 Other persistent atrial fibrillation: Secondary | ICD-10-CM

## 2021-10-15 DIAGNOSIS — I480 Paroxysmal atrial fibrillation: Secondary | ICD-10-CM | POA: Diagnosis not present

## 2021-10-15 DIAGNOSIS — I10 Essential (primary) hypertension: Secondary | ICD-10-CM | POA: Diagnosis not present

## 2021-10-15 DIAGNOSIS — I080 Rheumatic disorders of both mitral and aortic valves: Secondary | ICD-10-CM | POA: Diagnosis not present

## 2021-10-15 DIAGNOSIS — E039 Hypothyroidism, unspecified: Secondary | ICD-10-CM | POA: Diagnosis not present

## 2021-10-15 DIAGNOSIS — H8109 Meniere's disease, unspecified ear: Secondary | ICD-10-CM | POA: Diagnosis not present

## 2021-10-15 DIAGNOSIS — E785 Hyperlipidemia, unspecified: Secondary | ICD-10-CM | POA: Diagnosis not present

## 2021-10-15 LAB — POCT INR: INR: 2 (ref 2.0–3.0)

## 2021-10-16 ENCOUNTER — Other Ambulatory Visit: Payer: Self-pay | Admitting: Internal Medicine

## 2021-10-17 ENCOUNTER — Other Ambulatory Visit: Payer: Self-pay | Admitting: Physician Assistant

## 2021-10-18 ENCOUNTER — Encounter (HOSPITAL_COMMUNITY): Payer: Self-pay | Admitting: Cardiology

## 2021-10-18 DIAGNOSIS — Z20822 Contact with and (suspected) exposure to covid-19: Secondary | ICD-10-CM | POA: Diagnosis not present

## 2021-10-18 NOTE — Progress Notes (Signed)
Attempted to obtain medical history via telephone, unable to reach at this time. I left a voicemail to return pre surgical testing department's phone call.  

## 2021-10-22 ENCOUNTER — Ambulatory Visit (INDEPENDENT_AMBULATORY_CARE_PROVIDER_SITE_OTHER): Payer: Medicare Other | Admitting: Cardiovascular Disease

## 2021-10-22 DIAGNOSIS — I4819 Other persistent atrial fibrillation: Secondary | ICD-10-CM | POA: Diagnosis not present

## 2021-10-22 DIAGNOSIS — Z5181 Encounter for therapeutic drug level monitoring: Secondary | ICD-10-CM | POA: Diagnosis not present

## 2021-10-22 DIAGNOSIS — Z01812 Encounter for preprocedural laboratory examination: Secondary | ICD-10-CM | POA: Diagnosis not present

## 2021-10-22 DIAGNOSIS — I080 Rheumatic disorders of both mitral and aortic valves: Secondary | ICD-10-CM | POA: Diagnosis not present

## 2021-10-22 DIAGNOSIS — I10 Essential (primary) hypertension: Secondary | ICD-10-CM | POA: Diagnosis not present

## 2021-10-22 DIAGNOSIS — I480 Paroxysmal atrial fibrillation: Secondary | ICD-10-CM | POA: Diagnosis not present

## 2021-10-22 DIAGNOSIS — E785 Hyperlipidemia, unspecified: Secondary | ICD-10-CM | POA: Diagnosis not present

## 2021-10-22 DIAGNOSIS — E039 Hypothyroidism, unspecified: Secondary | ICD-10-CM | POA: Diagnosis not present

## 2021-10-22 DIAGNOSIS — H8109 Meniere's disease, unspecified ear: Secondary | ICD-10-CM | POA: Diagnosis not present

## 2021-10-22 LAB — POCT INR: INR: 3 (ref 2.0–3.0)

## 2021-10-23 LAB — BASIC METABOLIC PANEL
BUN/Creatinine Ratio: 18 (ref 12–28)
BUN: 22 mg/dL (ref 8–27)
CO2: 24 mmol/L (ref 20–29)
Calcium: 9.9 mg/dL (ref 8.7–10.3)
Chloride: 98 mmol/L (ref 96–106)
Creatinine, Ser: 1.21 mg/dL — ABNORMAL HIGH (ref 0.57–1.00)
Glucose: 86 mg/dL (ref 70–99)
Potassium: 4.5 mmol/L (ref 3.5–5.2)
Sodium: 137 mmol/L (ref 134–144)
eGFR: 44 mL/min/{1.73_m2} — ABNORMAL LOW (ref >59)

## 2021-10-23 LAB — PROTIME-INR
INR: 2.7 — ABNORMAL HIGH (ref 0.9–1.2)
Prothrombin Time: 27.2 s — ABNORMAL HIGH (ref 9.1–12.0)

## 2021-10-23 LAB — CBC
Hematocrit: 45.5 % (ref 34.0–46.6)
Hemoglobin: 14.4 g/dL (ref 11.1–15.9)
MCH: 26.4 pg — ABNORMAL LOW (ref 26.6–33.0)
MCHC: 31.6 g/dL (ref 31.5–35.7)
MCV: 84 fL (ref 79–97)
Platelets: 258 10*3/uL (ref 150–450)
RBC: 5.45 x10E6/uL — ABNORMAL HIGH (ref 3.77–5.28)
WBC: 6.2 10*3/uL (ref 3.4–10.8)

## 2021-10-25 DIAGNOSIS — H40013 Open angle with borderline findings, low risk, bilateral: Secondary | ICD-10-CM | POA: Diagnosis not present

## 2021-10-25 DIAGNOSIS — H31011 Macula scars of posterior pole (postinflammatory) (post-traumatic), right eye: Secondary | ICD-10-CM | POA: Diagnosis not present

## 2021-10-25 DIAGNOSIS — H353122 Nonexudative age-related macular degeneration, left eye, intermediate dry stage: Secondary | ICD-10-CM | POA: Diagnosis not present

## 2021-10-25 DIAGNOSIS — H353114 Nonexudative age-related macular degeneration, right eye, advanced atrophic with subfoveal involvement: Secondary | ICD-10-CM | POA: Diagnosis not present

## 2021-10-26 ENCOUNTER — Ambulatory Visit (HOSPITAL_BASED_OUTPATIENT_CLINIC_OR_DEPARTMENT_OTHER): Payer: Medicare Other | Admitting: Anesthesiology

## 2021-10-26 ENCOUNTER — Encounter (HOSPITAL_COMMUNITY)
Admission: RE | Disposition: A | Discharge status: Home / Self Care | Payer: Self-pay | Source: Home / Self Care | Attending: Cardiology

## 2021-10-26 ENCOUNTER — Encounter (HOSPITAL_COMMUNITY): Payer: Self-pay | Admitting: Cardiology

## 2021-10-26 ENCOUNTER — Other Ambulatory Visit: Payer: Self-pay

## 2021-10-26 ENCOUNTER — Ambulatory Visit (HOSPITAL_COMMUNITY): Payer: Medicare Other | Admitting: Anesthesiology

## 2021-10-26 ENCOUNTER — Ambulatory Visit (HOSPITAL_COMMUNITY)
Admission: RE | Admit: 2021-10-26 | Discharge: 2021-10-26 | Disposition: A | Discharge status: Home / Self Care | Payer: Medicare Other | Attending: Cardiology | Admitting: Cardiology

## 2021-10-26 DIAGNOSIS — I4891 Unspecified atrial fibrillation: Secondary | ICD-10-CM | POA: Diagnosis not present

## 2021-10-26 DIAGNOSIS — I10 Essential (primary) hypertension: Secondary | ICD-10-CM | POA: Diagnosis not present

## 2021-10-26 DIAGNOSIS — D638 Anemia in other chronic diseases classified elsewhere: Secondary | ICD-10-CM | POA: Diagnosis not present

## 2021-10-26 DIAGNOSIS — Z9049 Acquired absence of other specified parts of digestive tract: Secondary | ICD-10-CM | POA: Diagnosis not present

## 2021-10-26 DIAGNOSIS — E039 Hypothyroidism, unspecified: Secondary | ICD-10-CM | POA: Insufficient documentation

## 2021-10-26 DIAGNOSIS — Z7901 Long term (current) use of anticoagulants: Secondary | ICD-10-CM | POA: Diagnosis not present

## 2021-10-26 DIAGNOSIS — Z8616 Personal history of COVID-19: Secondary | ICD-10-CM | POA: Diagnosis not present

## 2021-10-26 DIAGNOSIS — Z932 Ileostomy status: Secondary | ICD-10-CM | POA: Diagnosis not present

## 2021-10-26 DIAGNOSIS — D6869 Other thrombophilia: Secondary | ICD-10-CM | POA: Insufficient documentation

## 2021-10-26 DIAGNOSIS — I480 Paroxysmal atrial fibrillation: Secondary | ICD-10-CM

## 2021-10-26 DIAGNOSIS — Z79899 Other long term (current) drug therapy: Secondary | ICD-10-CM | POA: Diagnosis not present

## 2021-10-26 DIAGNOSIS — I4819 Other persistent atrial fibrillation: Secondary | ICD-10-CM | POA: Insufficient documentation

## 2021-10-26 DIAGNOSIS — R600 Localized edema: Secondary | ICD-10-CM | POA: Insufficient documentation

## 2021-10-26 DIAGNOSIS — Z79890 Hormone replacement therapy (postmenopausal): Secondary | ICD-10-CM | POA: Diagnosis not present

## 2021-10-26 HISTORY — PX: CARDIOVERSION: SHX1299

## 2021-10-26 SURGERY — CARDIOVERSION
Anesthesia: General

## 2021-10-26 MED ORDER — METOPROLOL SUCCINATE ER 50 MG PO TB24: 50.0000 mg | ORAL_TABLET | Freq: Every day | ORAL | 3 refills | Status: DC

## 2021-10-26 MED ORDER — EPHEDRINE SULFATE-NACL 50-0.9 MG/10ML-% IV SOSY
PREFILLED_SYRINGE | INTRAVENOUS | Status: DC | PRN
  Administered 2021-10-26: 10 mg via INTRAVENOUS

## 2021-10-26 MED ORDER — PROPOFOL 10 MG/ML IV BOLUS
INTRAVENOUS | Status: DC | PRN
  Administered 2021-10-26: 80 mg via INTRAVENOUS

## 2021-10-26 MED ORDER — LIDOCAINE 2% (20 MG/ML) 5 ML SYRINGE
INTRAMUSCULAR | Status: DC | PRN
  Administered 2021-10-26: 80 mg via INTRAVENOUS

## 2021-10-26 NOTE — Transfer of Care (Signed)
Immediate Anesthesia Transfer of Care Note ? ?Patient: Jasmine Mccormick ? ?Procedure(s) Performed: CARDIOVERSION ? ?Patient Location: PACU and Endoscopy Unit ? ?Anesthesia Type:General ? ?Level of Consciousness: drowsy ? ?Airway & Oxygen Therapy: Patient Spontanous Breathing and Patient connected to nasal cannula oxygen ? ?Post-op Assessment: Report given to RN and Post -op Vital signs reviewed and stable ? ?Post vital signs: Reviewed and stable ? ?Last Vitals:  ?Vitals Value Taken Time  ?BP    ?Temp    ?Pulse    ?Resp    ?SpO2    ? ? ?Last Pain:  ?Vitals:  ? 10/26/21 0849  ?TempSrc: Temporal  ?PainSc: 0-No pain  ?   ? ?  ? ?Complications: No notable events documented. ?

## 2021-10-26 NOTE — Anesthesia Postprocedure Evaluation (Signed)
Anesthesia Post Note ? ?Patient: Jasmine Mccormick ? ?Procedure(s) Performed: CARDIOVERSION ? ?  ? ?Patient location during evaluation: PACU ?Anesthesia Type: General ?Level of consciousness: awake and alert ?Pain management: pain level controlled ?Vital Signs Assessment: post-procedure vital signs reviewed and stable ?Respiratory status: spontaneous breathing, nonlabored ventilation and respiratory function stable ?Cardiovascular status: blood pressure returned to baseline and stable ?Postop Assessment: no apparent nausea or vomiting ?Anesthetic complications: no ? ? ?No notable events documented. ? ?Last Vitals:  ?Vitals:  ? 10/26/21 0951 10/26/21 0957  ?BP: (!) 94/44 (!) 98/52  ?Pulse: (!) 45 (!) 46  ?Resp: 14 16  ?Temp:    ?SpO2: 99% 96%  ?  ?Last Pain:  ?Vitals:  ? 10/26/21 0951  ?TempSrc:   ?PainSc: 0-No pain  ? ? ?  ?  ?  ?  ?  ?  ? ?Lynda Rainwater ? ? ? ? ?

## 2021-10-26 NOTE — Discharge Instructions (Addendum)
I have decreased your dose of metoprolol. You were on metoprolol succinate 75 mg BID before; I have changed this to 50 mg once daily since your heart rate was slow after the cardioversion. If you notice that your heart rate is consistently more than 90 at rest, call me and we will change the dose again. ? ? ?

## 2021-10-26 NOTE — Interval H&P Note (Signed)
History and Physical Interval Note: ? ?10/26/2021 ?9:11 AM ? ?Jasmine Mccormick  has presented today for surgery, with the diagnosis of AFIB.  The various methods of treatment have been discussed with the patient and family. After consideration of risks, benefits and other options for treatment, the patient has consented to  Procedure(s): ?CARDIOVERSION (N/A) as a surgical intervention.  The patient's history has been reviewed, patient examined, no change in status, stable for surgery.  I have reviewed the patient's chart and labs.  Questions were answered to the patient's satisfaction.   ? ? ?Debhora Titus ? ? ?

## 2021-10-26 NOTE — CV Procedure (Signed)
Procedure:   DCCV ? ?Indication:  Symptomatic atrial fibrillation ? ?Procedure Note:  The patient signed informed consent.  They have had had therapeutic anticoagulation with coumadin greater than 3 weeks.  Anesthesia was administered by Dr. Sabra Heck.  Patient received 80 mg IV lidocaine and 80 mg IV propofol.Adequate airway was maintained throughout and vital followed per protocol.  They were cardioverted x 1 with 120J of biphasic synchronized energy.  They converted to sinus bradycardia.  There were no apparent complications.  The patient had normal neuro status and respiratory status post procedure with vitals stable as recorded elsewhere.   ? ?Follow up:  They will continue on current medical therapy and follow up with cardiology as scheduled. ? ?Buford Dresser, MD PhD ?10/26/2021 ?9:40 AM   ?

## 2021-10-26 NOTE — Anesthesia Preprocedure Evaluation (Signed)
Anesthesia Evaluation  ?Patient identified by MRN, date of birth, ID band ?Patient awake ? ? ? ?Reviewed: ?Allergy & Precautions, NPO status , Patient's Chart, lab work & pertinent test results ? ?History of Anesthesia Complications ?Negative for: history of anesthetic complications ? ?Airway ?Mallampati: III ? ?TM Distance: <3 FB ?Neck ROM: Full ? ? ? Dental ? ?(+) Dental Advisory Given, Teeth Intact ?  ?Pulmonary ?neg pulmonary ROS, neg shortness of breath, neg sleep apnea, neg COPD, neg recent URI,  ? ?           ?          ?  ?breath sounds clear to auscultation ? ? ? ? ? ? Cardiovascular ?hypertension, Pt. on medications ?(-) angina(-) Past MI and (-) CHF + dysrhythmias Atrial Fibrillation  ?Rhythm:Irregular  ??Left Ventricle: Left ventricular ejection fraction, by estimation, is 60  ?to 65%. The left ventricle has normal function. The left ventricle has no  ?regional wall motion abnormalities. The left ventricular internal cavity  ?size was normal in size. There is  ??no left ventricular hypertrophy. Left ventricular diastolic parameters  ?are indeterminate.  ?  ?Neuro/Psych ? Neuromuscular disease negative neurological ROS ? negative psych ROS  ? GI/Hepatic ?negative GI ROS, Neg liver ROS, PUD, Mesenteric Artery Blockage ?UC ?  ?Endo/Other  ?negative endocrine ROSHypothyroidism  ? Renal/GU ?negative Renal ROS  ?negative genitourinary ?  ?Musculoskeletal ?negative musculoskeletal ROS ?(+)  ? Abdominal ?ileostomy  ?Peds ?negative pediatric ROS ?(+)  Hematology ?negative hematology ROS ?(+) Blood dyscrasia, anemia ,   ?Anesthesia Other Findings ? ? Reproductive/Obstetrics ?negative OB ROS ? ?  ? ? ? ? ? ? ? ? ? ? ? ? ? ?  ?  ? ? ? ? ? ? ? ? ?Anesthesia Physical ? ?Anesthesia Plan ? ?ASA: 4 ? ?Anesthesia Plan: General  ? ?Post-op Pain Management:   ? ?Induction: Intravenous ? ?PONV Risk Score and Plan: 3 and Ondansetron, Dexamethasone, Treatment may vary due to age or medical  condition and Midazolam ? ?Airway Management Planned: Mask ? ?Additional Equipment:  ? ?Intra-op Plan:  ? ?Post-operative Plan:  ? ?Informed Consent: I have reviewed the patients History and Physical, chart, labs and discussed the procedure including the risks, benefits and alternatives for the proposed anesthesia with the patient or authorized representative who has indicated his/her understanding and acceptance.  ? ? ? ?Dental advisory given ? ?Plan Discussed with: CRNA and Anesthesiologist ? ?Anesthesia Plan Comments:   ? ? ? ? ? ? ?Anesthesia Quick Evaluation ? ?

## 2021-10-27 ENCOUNTER — Encounter (HOSPITAL_COMMUNITY): Payer: Self-pay | Admitting: Cardiology

## 2021-10-29 ENCOUNTER — Ambulatory Visit (INDEPENDENT_AMBULATORY_CARE_PROVIDER_SITE_OTHER): Payer: Medicare Other | Admitting: Internal Medicine

## 2021-10-29 ENCOUNTER — Encounter (HOSPITAL_BASED_OUTPATIENT_CLINIC_OR_DEPARTMENT_OTHER): Payer: Self-pay

## 2021-10-29 DIAGNOSIS — E039 Hypothyroidism, unspecified: Secondary | ICD-10-CM | POA: Diagnosis not present

## 2021-10-29 DIAGNOSIS — E785 Hyperlipidemia, unspecified: Secondary | ICD-10-CM | POA: Diagnosis not present

## 2021-10-29 DIAGNOSIS — I480 Paroxysmal atrial fibrillation: Secondary | ICD-10-CM | POA: Diagnosis not present

## 2021-10-29 DIAGNOSIS — Z5181 Encounter for therapeutic drug level monitoring: Secondary | ICD-10-CM

## 2021-10-29 DIAGNOSIS — I080 Rheumatic disorders of both mitral and aortic valves: Secondary | ICD-10-CM | POA: Diagnosis not present

## 2021-10-29 DIAGNOSIS — H8109 Meniere's disease, unspecified ear: Secondary | ICD-10-CM | POA: Diagnosis not present

## 2021-10-29 DIAGNOSIS — I10 Essential (primary) hypertension: Secondary | ICD-10-CM | POA: Diagnosis not present

## 2021-10-29 LAB — POCT INR: INR: 2.8 (ref 2.0–3.0)

## 2021-11-01 ENCOUNTER — Ambulatory Visit (INDEPENDENT_AMBULATORY_CARE_PROVIDER_SITE_OTHER): Payer: Medicare Other | Admitting: Cardiology

## 2021-11-01 DIAGNOSIS — I080 Rheumatic disorders of both mitral and aortic valves: Secondary | ICD-10-CM | POA: Diagnosis not present

## 2021-11-01 DIAGNOSIS — E039 Hypothyroidism, unspecified: Secondary | ICD-10-CM | POA: Diagnosis not present

## 2021-11-01 DIAGNOSIS — Z5181 Encounter for therapeutic drug level monitoring: Secondary | ICD-10-CM

## 2021-11-01 DIAGNOSIS — I10 Essential (primary) hypertension: Secondary | ICD-10-CM | POA: Diagnosis not present

## 2021-11-01 DIAGNOSIS — I480 Paroxysmal atrial fibrillation: Secondary | ICD-10-CM | POA: Diagnosis not present

## 2021-11-01 DIAGNOSIS — E785 Hyperlipidemia, unspecified: Secondary | ICD-10-CM | POA: Diagnosis not present

## 2021-11-01 DIAGNOSIS — H8109 Meniere's disease, unspecified ear: Secondary | ICD-10-CM | POA: Diagnosis not present

## 2021-11-01 LAB — POCT INR: INR: 2.8 (ref 2.0–3.0)

## 2021-11-08 ENCOUNTER — Ambulatory Visit: Payer: Medicare Other | Admitting: Internal Medicine

## 2021-11-08 NOTE — Progress Notes (Signed)
?Cardiology Office Note:   ? ?Date:  11/10/2021  ? ?ID:  Jasmine Mccormick, DOB [birth year], MRN [id number] ? ?PCP:  Plotnikov, Evie Lacks, MD  ?Cardiologist:  Buford Dresser, MD ? ?Referring MD: Cassandria Anger, MD  ? ?CC: follow up ? ?History of Present Illness:   ? ?Jasmine Mccormick is a 88 y.o. female with a complex history, including ulcerative colitis s/p colectomy with ileostomy placement >20 years ago, HTN, and hypothyroidism, notable for distal SMA occlusion s/p thrombectomy. Found to have new atrial fibrillation during hospitalization 03/2021.  ? ?Cardiac History: ?She underwent cardioversion on 06/15/2021. She converted to NSR at 74 bpm. However, sinus rhythm did not hold. She was started on amiodarone. ? ?Apixaban was very expensive (does not have medicare part D), followed by coumadin clinic at [facility].  ? ?Today: ?Since her last visit she had a cardioversion 10/26/2021. ? ?She is accompanied by a family member who also provides the history. Overall, she is feeling pretty good. She is in sinus rhythm today. Generally she feels the same as before, and frequently feels fatigued and drowsy. She attributes these symptoms to her age. ? ?Lately, she does not believe her swelling has improved much. They note that she was off of the Lasix for a time. ? ?Her blood pressure is elevated in clinic today (164/80). They are not sure what is contributing to this. She does not normally monitor her BP at home. ? ?She denies any palpitations, chest pain, or shortness of breath. No lightheadedness, headaches, syncope, orthopnea, or PND. ? ?Last week she saw her ophthalmologist, and some signs of macular degeneration were noted. She was started on Preservision vitamins for her eyes. ? ?Past Medical History:  ?Diagnosis Date  ? COVID 12/18/2020  ? mild  ? HTN (hypertension)   ? Normal at home  ? Hyperlipidemia   ? Hypothyroidism   ? Ileostomy in place [facility])   ? Memory loss   ? mild but not dx by doctor per [place]   ? Meniere disease 1996  ? Osteoporosis   ? UC (ulcerative colitis) ([place])   ? ? ?Past Surgical History:  ?Procedure Laterality Date  ? CARDIOVERSION N/A 06/15/2021  ? Procedure: CARDIOVERSION;  Surgeon: Werner Lean, MD;  Location: [place];  Service: Cardiovascular;  Laterality: N/A;  ? CARDIOVERSION N/A 10/26/2021  ? Procedure: CARDIOVERSION;  Surgeon: Buford Dresser, MD;  Location: [facility] ENDOSCOPY;  Service: Cardiovascular;  Laterality: N/A;  ? COLECTOMY    ? COLONOSCOPY    ? Ear Shunt for Menier's    ? ENDARTERECTOMY MESENTERIC Right 03/29/2021  ? Procedure: MESENTERIC ARTERY THROMBECTOMY;  Surgeon: Serafina Mitchell, MD;  Location: [facility] OR;  Service: Vascular;  Laterality: Right;  ? ILEOSTOMY    ? INTRAOPERATIVE ARTERIOGRAM Right 03/29/2021  ? Procedure: INTRA OPERATIVE AORTOGRAM;  Surgeon: Serafina Mitchell, MD;  Location: [place];  Service: Vascular;  Laterality: Right;  ? KYPHOPLASTY N/A 01/07/2021  ? Procedure: Lumbar three Kyphoplasty;  Surgeon: Dawley, Theodoro Doing, DO;  Location: [place];  Service: Neurosurgery;  Laterality: N/A;  ? PROCTECTOMY    ? ULTRASOUND GUIDANCE FOR VASCULAR ACCESS Right 03/29/2021  ? Procedure: ULTRASOUND GUIDANCE FOR VASCULAR ACCESS;  Surgeon: Serafina Mitchell, MD;  Location: [facility] OR;  Service: Vascular;  Laterality: Right;  ? ? ?Current Medications: ?Current Outpatient Medications on File Prior to Visit  ?Medication Sig  ? amiodarone (PACERONE) 200 MG tablet Take 1 tablet (200 mg total) by mouth daily.  ? calcium carbonate (TUMS -  DOSED IN MG ELEMENTAL CALCIUM) 500 MG chewable tablet Chew 1 tablet by mouth as needed for indigestion or heartburn.  ? ezetimibe (ZETIA) 10 MG tablet Take 10 mg by mouth daily.  ? ferrous sulfate 325 (65 FE) MG tablet Take 1 tablet (325 mg total) by mouth daily with breakfast.  ? furosemide (LASIX) 20 MG tablet Take 1 tablet (20 mg total) by mouth daily as needed for fluid. (Patient taking differently: Take 20 mg by mouth daily.)  ? levothyroxine  (SYNTHROID) 75 MCG tablet TAKE 1 TABLET BY MOUTH EVERY DAY  ? metoprolol succinate (TOPROL-XL) 50 MG 24 hr tablet Take 1 tablet (50 mg total) by mouth daily. Take with or immediately following a meal.  ? Multiple Vitamins-Minerals (PRESERVISION AREDS 2 PO) Take by mouth 2 (two) times daily.  ? omeprazole (PRILOSEC) 40 MG capsule Take 1 capsule (40 mg total) by mouth 2 (two) times daily before a meal.  ? simvastatin (ZOCOR) 20 MG tablet TAKE 1 TABLET BY MOUTH EVERY DAY IN THE EVENING  ? triamcinolone ointment (KENALOG) 0.1 % Apply 1 application topically 3 (three) times daily as needed.  ? ?No current facility-administered medications on file prior to visit.  ?  ? ?Allergies:   Patient has no known allergies.  ? ?Social History  ? ?Tobacco Use  ? Smoking status: Never  ? Smokeless tobacco: Never  ?Vaping Use  ? Vaping Use: Never used  ?Substance Use Topics  ? Alcohol use: No  ? Drug use: No  ? ? ?Family History: ?family history includes Breast cancer in her sister; Heart disease in an other family member; Hypertension in an other family member. ? ?ROS:   ?Please see the history of present illness.   ?(+) Fatigue ?(+) Daytime somnolence ?(+) Bilateral LE edema ?Additional pertinent ROS otherwise unremarkable.  ? ?EKGs/Labs/Other Studies Reviewed:   ? ?The following studies were reviewed today: ? ?Echocardiogram 03/30/21: ? 1. Patient in rapid afib during study.  ? 2. Left ventricular ejection fraction, by estimation, is 60 to 65%. The  ?left ventricle has normal function. The left ventricle has no regional  ?wall motion abnormalities. Left ventricular diastolic parameters are  ?indeterminate.  ? 3. Right ventricular systolic function is normal. The right ventricular  ?size is normal. There is normal pulmonary artery systolic pressure.  ? 4. Left atrial size was moderately dilated.  ? 5. Right atrial size was moderately dilated.  ? 6. The mitral valve is degenerative. Trivial mitral valve regurgitation.  ?No evidence of  mitral stenosis. Moderate mitral annular calcification.  ? 7. The aortic valve is tricuspid. Aortic valve regurgitation is not  ?visualized. Mild to moderate aortic valve sclerosis/calcification is  ?present, without any evidence of aortic stenosis.  ? 8. The inferior vena cava is normal in size with greater than 50%  ?respiratory variability, suggesting right atrial pressure of 3 mmHg. ? ?CTA Abdomen/Pelvis 03/29/2021: ?IMPRESSION: ?VASCULAR ?  ?1. Occlusion of the distal superior mesenteric artery with partial ?reconstitution of flow. ?2. Moderate to marked severity diffuse calcification and ?atherosclerosis throughout the remaining arterial structures within ?the abdomen and pelvis. ?  ?NON-VASCULAR ?  ?1. 2 mm nonobstructing left renal stone. ?2. Postoperative changes with a subsequent right lower quadrant ?ostomy site. ?3. Large hiatal hernia. ? ?EKG:  EKG is personally reviewed.   ?11/10/2021: NSR at 64 bpm ?10/08/2021: atrial fibrillation at 86 bpm ?08/24/2021: EKG was not ordered. ?07/01/21: afib RVR at 116 bpm ?04/15/21: afib RVR at 109 bpm ? ?Recent Labs: ?03/17/2021:  TSH 9.46 ?03/31/2021: ALT 15 ?06/01/2021: BNP 325.9 ?10/22/2021: BUN 22; Creatinine, Ser 1.21; Hemoglobin 14.4; Platelets 258; Potassium 4.5; Sodium 137  ? ?Recent Lipid Panel ?   ?Component Value Date/Time  ? CHOL 122 04/01/2021 0410  ? TRIG 119 04/01/2021 0410  ? HDL 38 (L) 04/01/2021 0410  ? CHOLHDL 3.2 04/01/2021 0410  ? VLDL 24 04/01/2021 0410  ? [place] 60 04/01/2021 0410  ? ? ?Physical Exam:   ? ?VS:  BP (!) 164/80 (BP Location: Right Arm, Patient Position: Sitting, Cuff Size: Normal)   Pulse 64   Ht 5' 6"  (1.676 m)   Wt 146 lb 6.4 oz (66.4 kg)   LMP  (LMP Unknown)   BMI 23.63 kg/m?    ? ?Wt Readings from Last 3 Encounters:  ?11/10/21 146 lb 6.4 oz (66.4 kg)  ?10/26/21 161 lb (73 kg)  ?10/08/21 155 lb 12.8 oz (70.7 kg)  ?  ?GEN: Well nourished, well developed in no acute distress ?HEENT: Normal, moist mucous membranes ?NECK: No  JVD ?CARDIAC: regular rhythm, normal S1 and S2, no rubs or gallops. No murmur. ?VASCULAR: Radial and DP pulses 2+ bilaterally. No carotid bruits ?RESPIRATORY:  Clear to auscultation without rales, wheezing or rhonchi  ?AB

## 2021-11-09 ENCOUNTER — Encounter: Payer: Medicare Other | Admitting: Internal Medicine

## 2021-11-10 ENCOUNTER — Other Ambulatory Visit: Payer: Self-pay

## 2021-11-10 ENCOUNTER — Encounter (HOSPITAL_BASED_OUTPATIENT_CLINIC_OR_DEPARTMENT_OTHER): Payer: Self-pay | Admitting: Cardiology

## 2021-11-10 ENCOUNTER — Ambulatory Visit (INDEPENDENT_AMBULATORY_CARE_PROVIDER_SITE_OTHER): Payer: Medicare Other | Admitting: Cardiology

## 2021-11-10 VITALS — BP 164/80 | HR 64 | Ht 66.0 in | Wt 146.4 lb

## 2021-11-10 DIAGNOSIS — R600 Localized edema: Secondary | ICD-10-CM | POA: Diagnosis not present

## 2021-11-10 DIAGNOSIS — I4819 Other persistent atrial fibrillation: Secondary | ICD-10-CM

## 2021-11-10 DIAGNOSIS — D6869 Other thrombophilia: Secondary | ICD-10-CM

## 2021-11-10 DIAGNOSIS — Z7901 Long term (current) use of anticoagulants: Secondary | ICD-10-CM

## 2021-11-10 MED ORDER — APIXABAN 5 MG PO TABS: 5.0000 mg | ORAL_TABLET | Freq: Two times a day (BID) | ORAL | 3 refills | Status: DC

## 2021-11-10 NOTE — Patient Instructions (Signed)
Medication Instructions:  ?STOP: Coumadin ?START: Eliquis 5 mg twice a day ? ?*If you need a refill on your cardiac medications before your next appointment, please call your pharmacy* ? ? ?Lab Work: ?None ordered today ? ? ?Testing/Procedures: ?None ordered today ? ? ?Follow-Up: ?At [facility], you and your health needs are our priority.  As part of our continuing mission to provide you with exceptional heart care, we have created designated Provider Care Teams.  These Care Teams include your primary Cardiologist (physician) and Advanced Practice Providers (APPs -  Physician Assistants and Nurse Practitioners) who all work together to provide you with the care you need, when you need it. ? ?We recommend signing up for the patient portal called "MyChart".  Sign up information is provided on this After Visit Summary.  MyChart is used to connect with patients for Virtual Visits (Telemedicine).  Patients are able to view lab/test results, encounter notes, upcoming appointments, etc.  Non-urgent messages can be sent to your provider as well.   ?To learn more about what you can do with MyChart, go to [email or web address].   ? ?Your next appointment:   ?6 month(s) ? ?The format for your next appointment:   ?In Person ? ?Provider:   ?Buford Dresser, MD{ ? ? ?

## 2021-11-12 ENCOUNTER — Encounter (HOSPITAL_BASED_OUTPATIENT_CLINIC_OR_DEPARTMENT_OTHER): Payer: Self-pay | Admitting: Cardiology

## 2021-11-13 ENCOUNTER — Encounter (HOSPITAL_COMMUNITY): Payer: Self-pay

## 2021-11-13 ENCOUNTER — Other Ambulatory Visit: Payer: Self-pay

## 2021-11-13 ENCOUNTER — Emergency Department (HOSPITAL_COMMUNITY)
Admission: EM | Admit: 2021-11-13 | Discharge: 2021-11-13 | Disposition: A | Discharge status: Home / Self Care | Payer: Medicare Other | Attending: Emergency Medicine | Admitting: Emergency Medicine

## 2021-11-13 DIAGNOSIS — Z933 Colostomy status: Secondary | ICD-10-CM

## 2021-11-13 DIAGNOSIS — Z7901 Long term (current) use of anticoagulants: Secondary | ICD-10-CM | POA: Diagnosis not present

## 2021-11-13 DIAGNOSIS — K9401 Colostomy hemorrhage: Secondary | ICD-10-CM | POA: Insufficient documentation

## 2021-11-13 DIAGNOSIS — I10 Essential (primary) hypertension: Secondary | ICD-10-CM | POA: Diagnosis not present

## 2021-11-13 DIAGNOSIS — Z433 Encounter for attention to colostomy: Secondary | ICD-10-CM | POA: Diagnosis not present

## 2021-11-13 DIAGNOSIS — R58 Hemorrhage, not elsewhere classified: Secondary | ICD-10-CM | POA: Diagnosis not present

## 2021-11-13 DIAGNOSIS — Z20822 Contact with and (suspected) exposure to covid-19: Secondary | ICD-10-CM | POA: Diagnosis not present

## 2021-11-13 LAB — CBC WITH DIFFERENTIAL/PLATELET
Abs Immature Granulocytes: 0.01 10*3/uL (ref 0.00–0.07)
Basophils Absolute: 0 10*3/uL (ref 0.0–0.1)
Basophils Relative: 1 %
Eosinophils Absolute: 0.1 10*3/uL (ref 0.0–0.5)
Eosinophils Relative: 1 %
HCT: 50.1 % — ABNORMAL HIGH (ref 36.0–46.0)
Hemoglobin: 15.9 g/dL — ABNORMAL HIGH (ref 12.0–15.0)
Immature Granulocytes: 0 %
Lymphocytes Relative: 10 %
Lymphs Abs: 0.6 10*3/uL — ABNORMAL LOW (ref 0.7–4.0)
MCH: 28.1 pg (ref 26.0–34.0)
MCHC: 31.7 g/dL (ref 30.0–36.0)
MCV: 88.7 fL (ref 80.0–100.0)
Monocytes Absolute: 0.7 10*3/uL (ref 0.1–1.0)
Monocytes Relative: 13 %
Neutro Abs: 4.2 10*3/uL (ref 1.7–7.7)
Neutrophils Relative %: 75 %
Platelets: 190 10*3/uL (ref 150–400)
RBC: 5.65 MIL/uL — ABNORMAL HIGH (ref 3.87–5.11)
WBC: 5.7 10*3/uL (ref 4.0–10.5)
nRBC: 0 % (ref 0.0–0.2)

## 2021-11-13 NOTE — ED Provider Notes (Signed)
This patient has a long history managing her ostomy.  She was recently started on a blood thinner.  She has since developed small amounts of blood present near the ostomy site.  For this reason, she presents to the ED.  Currently awaiting results of CBC.  Patient looks well on exam and there is minimal evidence of any clinically significant mucosal bleeding.  She would likely be appropriate for discharge. ?Physical Exam  ?BP (!) 190/90   Pulse 67   Temp (!) 97.4 ?F (36.3 ?C) (Oral)   Resp 16   Ht 5' 6"  (1.676 m)   Wt 66.4 kg   LMP  (LMP Unknown)   SpO2 98%   BMI 23.63 kg/m?  ? ?Physical Exam ?Constitutional:   ?   General: She is not in acute distress. ?   Appearance: Normal appearance. She is normal weight. She is not ill-appearing, toxic-appearing or diaphoretic.  ?HENT:  ?   Head: Normocephalic and atraumatic.  ?   Right Ear: External ear normal.  ?   Left Ear: External ear normal.  ?   Nose: Nose normal.  ?   Mouth/Throat:  ?   Mouth: Mucous membranes are moist.  ?   Pharynx: Oropharynx is clear.  ?Cardiovascular:  ?   Rate and Rhythm: Normal rate and regular rhythm.  ?Pulmonary:  ?   Effort: Pulmonary effort is normal. No respiratory distress.  ?Abdominal:  ?   General: There is no distension.  ?   Palpations: Abdomen is soft.  ?   Tenderness: There is no abdominal tenderness.  ?   Comments: Ostomy bag in place in RLQ.  Soft brown stool is present in bag.  There are very slight streaks of blood present in stool  ?Musculoskeletal:     ?   General: Normal range of motion.  ?   Cervical back: Normal range of motion. No rigidity.  ?Skin: ?   General: Skin is warm and dry.  ?Neurological:  ?   General: No focal deficit present.  ?   Mental Status: She is alert and oriented to person, place, and time.  ?   Cranial Nerves: No cranial nerve deficit.  ?   Sensory: No sensory deficit.  ?   Motor: No weakness.  ?   Coordination: Coordination normal.  ?Psychiatric:     ?   Mood and Affect: Mood normal.     ?    Behavior: Behavior normal.     ?   Thought Content: Thought content normal.     ?   Judgment: Judgment normal.  ? ? ?Procedures  ?Procedures ? ?ED Course / MDM  ?  ?Medical Decision Making ?Amount and/or Complexity of Data Reviewed ?Labs: ordered. ? ? ?Patient is well-appearing on exam.  Soft light brown stool is present in ostomy bag.  There are very slight streaks of blood.  Abdomen is soft without any tenderness.  Patient states her desire to go home and eat.  CBC shows increased hemoglobin from baseline.  I do not suspect any clinically significant blood loss.  Patient was advised to follow-up with her gastroenterologist and to return to the ED for any worsening of symptoms.  She was discharged in good condition. ? ? ? ? ?  ?Godfrey Pick, MD ?11/13/21 2316 ? ?

## 2021-11-13 NOTE — ED Provider Notes (Signed)
?[place] ?Provider Note ? ? ?CSN: [id number] ?Arrival date & time: 11/13/21  1448 ? ?  ? ?History ? ?Chief Complaint  ?Patient presents with  ? colostomy site bleeding  ? ? ?Jasmine Mccormick is a 88 y.o. 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  She reports that she was in the bathroom today and her niece came in and noted blood around her ostomy site.  She subsequently called 911.  Patient reports her ostomy bag is still working, denies any pain, dizziness, shortness of breath and has not noted any blood in the stool.  Says that she has had this minor bleeding yesterday and today. ? ?HPI ? ?  ? ?Home Medications ?Prior to Admission medications   ?Medication Sig Start Date End Date Taking? Authorizing Provider  ?amiodarone (PACERONE) 200 MG tablet Take 1 tablet (200 mg total) by mouth daily. 07/23/21   Buford Dresser, MD  ?apixaban (ELIQUIS) 5 MG TABS tablet Take 1 tablet (5 mg total) by mouth 2 (two) times daily. 11/10/21   Buford Dresser, MD  ?calcium carbonate (TUMS - DOSED IN MG ELEMENTAL CALCIUM) 500 MG chewable tablet Chew 1 tablet by mouth as needed for indigestion or heartburn.    [provider]  ?ezetimibe (ZETIA) 10 MG tablet Take 10 mg by mouth daily.    [provider]  ?ferrous sulfate 325 (65 FE) MG tablet Take 1 tablet (325 mg total) by mouth daily with breakfast. 09/10/21   Willia Craze, NP  ?furosemide (LASIX) 20 MG tablet Take 1 tablet (20 mg total) by mouth daily as needed for fluid. ?Patient taking differently: Take 20 mg by mouth daily. 07/01/21 12/28/21  Buford Dresser, MD  ?levothyroxine (SYNTHROID) 75 MCG tablet TAKE 1 TABLET BY MOUTH EVERY DAY 10/18/21   Plotnikov, Evie Lacks, MD  ?metoprolol succinate (TOPROL-XL) 50 MG 24 hr tablet Take 1 tablet (50 mg total) by mouth  daily. Take with or immediately following a meal. 10/26/21   Buford Dresser, MD  ?Multiple Vitamins-Minerals (PRESERVISION AREDS 2 PO) Take by mouth 2 (two) times daily.    [provider]  ?omeprazole (PRILOSEC) 40 MG capsule Take 1 capsule (40 mg total) by mouth 2 (two) times daily before a meal. 09/10/21   Willia Craze, NP  ?simvastatin (ZOCOR) 20 MG tablet TAKE 1 TABLET BY MOUTH EVERY DAY IN THE EVENING 10/18/21   Waynetta Sandy, MD  ?triamcinolone ointment (KENALOG) 0.1 % Apply 1 application topically 3 (three) times daily as needed. 08/10/21   Plotnikov, Evie Lacks, MD  ?   ? ?Allergies    ?Patient has no known allergies.   ? ?Review of Systems   ?Review of Systems  ?Respiratory:  Negative for chest tightness and shortness of breath.   ?Cardiovascular:  Negative for palpitations.  ?Gastrointestinal:  Negative for abdominal distention and abdominal pain.  ?Neurological:  Negative for dizziness and weakness.  ? ?Physical Exam ?Updated Vital Signs ?BP (!) 200/96   Pulse 68   Temp (!) 97.4 ?F (36.3 ?C) (Oral)   Resp 18   Ht 5' 6"  (1.676 m)   Wt 66.4 kg   LMP  (LMP Unknown)   SpO2 98%   BMI 23.63 kg/m?  ?Physical Exam ?Vitals and nursing note reviewed.  ?Constitutional:   ?   Appearance: Normal appearance.  ?HENT:  ?  Head: Normocephalic and atraumatic.  ?Eyes:  ?   General: No scleral icterus. ?   Conjunctiva/sclera: Conjunctivae normal.  ?Pulmonary:  ?   Effort: Pulmonary effort is normal. No respiratory distress.  ?Abdominal:  ?   General: Abdomen is flat.  ?   Palpations: Abdomen is soft.  ?   Comments: Abdomen flat, soft and nontender to palpation.  No tenderness to ostomy, some bright red blood noted around stoma however does not appear to be mixed in with patient's stool.  No surrounding bruising or signs of infection  ?Skin: ?   Findings: No rash.  ?Neurological:  ?   Mental Status: She is alert.  ?Psychiatric:     ?   Mood and Affect: Mood normal.  ? ? ?ED Results /  Procedures / Treatments   ?Labs ?(all labs ordered are listed, but only abnormal results are displayed) ?Labs Reviewed - No data to display ? ?EKG ?None ? ?Radiology ?No results found. ? ?Procedures ?Procedures  ? ?Medications Ordered in ED ?Medications - No data to display ? ?ED Course/ Medical Decision Making/ A&P ?  ?                        ?Medical Decision Making ?Amount and/or Complexity of Data Reviewed ?Labs: ordered. ? ? ?88 year old female with a ileostomy presenting with bleeding around the site.  Of note, patient started Eliquis 3 days ago after being diagnosed with A-fib.  Suspect bleeding to be the result of this medication initiation.  Noted to be hypertensive, trending downward.  Asymptomatic. ? ?Physical exam: Revealing of bright red blood surrounding patient's stoma on mucosa, tan stool in patient's bag, nontender abdomen and stoma. ? ?Work-up: CBC ordered to assess for anemia.  It is pending at shift change.  See Dr. Mee Hives note for results and ultimate disposition.  I suspect patient will be discharged home if hemoglobin is stable.  She will follow-up with her chronic care providers for reassessment. ? ? ?  ?Rhae Hammock, PA-C ?11/14/21 2992 ? ?  ?Godfrey Pick, MD ?11/15/21 0016 ? ?

## 2021-11-13 NOTE — ED Triage Notes (Signed)
Pt arrived via [facility] form home for c/o colostomy site bleeding int for past 8 mos. Per EMS, family states the bleeding has gotten worse the last few wks. Pt denies pain at the site. Pt has scant bleeding at site. It does not appear to be continuous bleeding at present. ?

## 2021-11-15 ENCOUNTER — Encounter: Payer: Self-pay | Admitting: Podiatrist

## 2021-11-15 ENCOUNTER — Ambulatory Visit: Payer: Medicare Other | Admitting: Podiatry

## 2021-11-15 ENCOUNTER — Ambulatory Visit (INDEPENDENT_AMBULATORY_CARE_PROVIDER_SITE_OTHER): Payer: Medicare Other | Admitting: Podiatrist

## 2021-11-15 DIAGNOSIS — M79675 Pain in left toe(s): Secondary | ICD-10-CM | POA: Diagnosis not present

## 2021-11-15 DIAGNOSIS — M79674 Pain in right toe(s): Secondary | ICD-10-CM | POA: Diagnosis not present

## 2021-11-15 DIAGNOSIS — B351 Tinea unguium: Secondary | ICD-10-CM

## 2021-11-18 NOTE — Progress Notes (Signed)
Subjective: ?Jasmine Mccormick is a 88 y.o. female patient who presents to office today complaining of long,mildly painful nails  while ambulating in shoes; unable to trim.  Patient denies any new changes in medication or new problems. Patient denies any new cramping, numbness, burning or tingling in the legs. ? ?Patient Active Problem List  ? Diagnosis Date Noted  ? Secondary hypercoagulable state ([place]) 10/08/2021  ? Current use of long term anticoagulation 10/08/2021  ? Hematochezia 08/10/2021  ? Paroxysmal atrial fibrillation ([facility])   ? Persistent atrial fibrillation ([place])   ? GI bleeding 03/29/2021  ? Hypertensive urgency 03/29/2021  ? Acute GI bleeding 03/29/2021  ? Abdominal pain 03/29/2021  ? 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 Grief 03/17/2021  ? Gait disorder 03/17/2021  ? Fracture of third lumbar vertebra ([place]) 02/16/2021  ? UTI (urinary tract infection) 02/01/2021  ? COVID-19 12/22/2020  ? Body mass index (BMI) 26.0-26.9, adult 11/24/2020  ? Elevated blood-pressure reading, without diagnosis of hypertension 11/24/2020  ? Wedge compression fracture of third lumbar vertebra, initial encounter for closed fracture ([place]) 11/24/2020  ? Acute bilateral low back pain with left-sided sciatica 11/12/2020  ? Thigh shingles 04/02/2020  ? Acute right hip pain 04/02/2020  ? Dysuria 08/18/2017  ? Hearing loss of both ears due to cerumen impaction 09/03/2015  ? Callus of foot 09/03/2015  ? Low vitamin B12 level 09/03/2015  ? Right-sided face pain 08/21/2014  ? Well adult exam 07/04/2012  ? Hyperglycemia 07/04/2012  ? Hand dermatitis 06/22/2011  ? Hypothyroidism 12/07/2010  ? Actinic keratosis 05/04/2010  ? NEOPLASM, SKIN, UNCERTAIN BEHAVIOR 02/63/7858  ? CERUMEN IMPACTION 09/26/2007  ? RASH-NONVESICULAR 06/12/2007  ? Dyslipidemia 03/19/2007  ? [place] DISEASE 03/19/2007  ? Essential hypertension 03/19/2007  ? Ulcerative colitis ([place]) 03/19/2007  ? Osteoporosis 03/19/2007  ? ?Current Outpatient Medications  on File Prior to Visit  ?Medication Sig Dispense Refill  ? amiodarone (PACERONE) 200 MG tablet Take 1 tablet (200 mg total) by mouth daily. 90 tablet 3  ? apixaban (ELIQUIS) 5 MG TABS tablet Take 1 tablet (5 mg total) by mouth 2 (two) times daily. 90 tablet 3  ? calcium carbonate (TUMS - DOSED IN MG ELEMENTAL CALCIUM) 500 MG chewable tablet Chew 1 tablet by mouth as needed for indigestion or heartburn.    ? ezetimibe (ZETIA) 10 MG tablet Take 10 mg by mouth daily.    ? ferrous sulfate 325 (65 FE) MG tablet Take 1 tablet (325 mg total) by mouth daily with breakfast. 30 tablet 3  ? furosemide (LASIX) 20 MG tablet Take 1 tablet (20 mg total) by mouth daily as needed for fluid. (Patient taking differently: Take 20 mg by mouth daily.) 30 tablet 5  ? levothyroxine (SYNTHROID) 75 MCG tablet TAKE 1 TABLET BY MOUTH EVERY DAY 90 tablet 1  ? metoprolol succinate (TOPROL-XL) 50 MG 24 hr tablet Take 1 tablet (50 mg total) by mouth daily. Take with or immediately following a meal. 90 tablet 3  ? Multiple Vitamins-Minerals (PRESERVISION AREDS 2 PO) Take by mouth 2 (two) times daily.    ? omeprazole (PRILOSEC) 40 MG capsule Take 1 capsule (40 mg total) by mouth 2 (two) times daily before a meal. 60 capsule 3  ? simvastatin (ZOCOR) 20 MG tablet TAKE 1 TABLET BY MOUTH EVERY DAY IN THE EVENING 30 tablet 3  ? triamcinolone ointment (KENALOG) 0.1 % Apply 1 application topically 3 (three) times daily as needed. 80 g 3  ? ?No current facility-administered medications on  file prior to visit.  ? ?No Known Allergies ? ? ?Objective: ?General: Patient is awake, alert, and oriented x 3 and in no acute distress. ? ?Integument: Skin is warm, dry and supple bilateral. Nails are tender, long, thickened and  ?dystrophic with subungual debris, consistent with onychomycosis, 1-5 bilateral. No signs of infection. No open lesions or preulcerative lesions present bilateral. Remaining integument unremarkable. ? ?Vasculature:  Dorsalis Pedis pulse 1/4  bilateral. Posterior Tibial pulse  1/4 bilateral.  ?Capillary fill time <3 sec 1-5 bilateral. Positive hair growth to the level of the digits. ?Temperature gradient within normal limits. No varicosities present bilateral. No edema present bilateral.  ? ?Neurology: The patient has intact sensation measured with a 5.07/10g Semmes Weinstein Monofilament at all pedal sites bilateral . Vibratory sensation diminished bilateral with tuning fork. No Babinski sign present bilateral.  ? ?Musculoskeletal: No symptomatic pedal deformities noted bilateral. Muscular strength 5/5 in all lower extremity muscular groups bilateral without pain on range of motion . No tenderness with calf compression bilateral. ? ?Assessment and Plan: ?Problem List Items Addressed This Visit   ?None ?Visit Diagnoses   ? ? Pain due to onychomycosis of toenails of both feet    -  Primary  ? ?  ? ? ?-Examined patient. ?-Mechanically debrided all nails 1-5 bilateral using sterile nail nipper and filed with dremel without incident  ?-Answered all patient questions ?-Patient to return  in 3 months for continued foot care.  ?-Patient advised to call the office if any problems or questions arise in the meantime. ? ?Bronson Ing, DPM ?

## 2021-11-20 ENCOUNTER — Other Ambulatory Visit: Payer: Self-pay | Admitting: Internal Medicine

## 2021-11-22 ENCOUNTER — Telehealth: Payer: Self-pay | Admitting: Internal Medicine

## 2021-11-22 NOTE — Telephone Encounter (Signed)
[place] with [facility] calls today in regards to orders for PT. PT had visited the hospital recently due to issues with their ostomy. PT's niece had reached out to [facility] about renewing services for PT. [facility] would like orders to be put in for Nurse Evaluation and treatment. Verbal order's can be given or left on secure voicemail at  ? ?CB: [phone] ?

## 2021-11-23 NOTE — Telephone Encounter (Signed)
Ok Thx 

## 2021-11-25 ENCOUNTER — Other Ambulatory Visit (HOSPITAL_BASED_OUTPATIENT_CLINIC_OR_DEPARTMENT_OTHER): Payer: Self-pay | Admitting: Family

## 2021-11-25 ENCOUNTER — Telehealth: Payer: Self-pay

## 2021-11-25 DIAGNOSIS — Z432 Encounter for attention to ileostomy: Secondary | ICD-10-CM | POA: Diagnosis not present

## 2021-11-25 DIAGNOSIS — I10 Essential (primary) hypertension: Secondary | ICD-10-CM | POA: Diagnosis not present

## 2021-11-25 DIAGNOSIS — R413 Other amnesia: Secondary | ICD-10-CM | POA: Diagnosis not present

## 2021-11-25 DIAGNOSIS — Z9181 History of falling: Secondary | ICD-10-CM | POA: Diagnosis not present

## 2021-11-25 DIAGNOSIS — E7800 Pure hypercholesterolemia, unspecified: Secondary | ICD-10-CM | POA: Diagnosis not present

## 2021-11-25 DIAGNOSIS — I4819 Other persistent atrial fibrillation: Secondary | ICD-10-CM | POA: Diagnosis not present

## 2021-11-25 DIAGNOSIS — H8109 Meniere's disease, unspecified ear: Secondary | ICD-10-CM | POA: Diagnosis not present

## 2021-11-25 DIAGNOSIS — E039 Hypothyroidism, unspecified: Secondary | ICD-10-CM | POA: Diagnosis not present

## 2021-11-25 DIAGNOSIS — I480 Paroxysmal atrial fibrillation: Secondary | ICD-10-CM

## 2021-11-25 DIAGNOSIS — M810 Age-related osteoporosis without current pathological fracture: Secondary | ICD-10-CM | POA: Diagnosis not present

## 2021-11-25 DIAGNOSIS — K5190 Ulcerative colitis, unspecified, without complications: Secondary | ICD-10-CM | POA: Diagnosis not present

## 2021-11-25 DIAGNOSIS — Z7901 Long term (current) use of anticoagulants: Secondary | ICD-10-CM | POA: Diagnosis not present

## 2021-11-25 DIAGNOSIS — H3530 Unspecified macular degeneration: Secondary | ICD-10-CM | POA: Diagnosis not present

## 2021-11-25 NOTE — Telephone Encounter (Signed)
Mickel Baas with [facility] has called asking for verbal orders to do PT 1w5 and 2PRN's for ostomy. ? ?LAURA can be reached at [phone]. ?

## 2021-11-26 NOTE — Telephone Encounter (Signed)
Okay.  Thanks.

## 2021-11-26 NOTE — Telephone Encounter (Signed)
Notified Mickel Baas w/ MD response.Marland KitchenJohny Chess ?

## 2021-11-29 DIAGNOSIS — E7800 Pure hypercholesterolemia, unspecified: Secondary | ICD-10-CM | POA: Diagnosis not present

## 2021-11-29 DIAGNOSIS — I4819 Other persistent atrial fibrillation: Secondary | ICD-10-CM | POA: Diagnosis not present

## 2021-11-29 DIAGNOSIS — Z20822 Contact with and (suspected) exposure to covid-19: Secondary | ICD-10-CM | POA: Diagnosis not present

## 2021-11-29 DIAGNOSIS — K5190 Ulcerative colitis, unspecified, without complications: Secondary | ICD-10-CM | POA: Diagnosis not present

## 2021-11-29 DIAGNOSIS — H3530 Unspecified macular degeneration: Secondary | ICD-10-CM | POA: Diagnosis not present

## 2021-11-29 DIAGNOSIS — I10 Essential (primary) hypertension: Secondary | ICD-10-CM | POA: Diagnosis not present

## 2021-11-29 DIAGNOSIS — E039 Hypothyroidism, unspecified: Secondary | ICD-10-CM | POA: Diagnosis not present

## 2021-12-02 DIAGNOSIS — Z20828 Contact with and (suspected) exposure to other viral communicable diseases: Secondary | ICD-10-CM | POA: Diagnosis not present

## 2021-12-04 DIAGNOSIS — E039 Hypothyroidism, unspecified: Secondary | ICD-10-CM | POA: Diagnosis not present

## 2021-12-04 DIAGNOSIS — H8109 Meniere's disease, unspecified ear: Secondary | ICD-10-CM | POA: Diagnosis not present

## 2021-12-04 DIAGNOSIS — Z432 Encounter for attention to ileostomy: Secondary | ICD-10-CM | POA: Diagnosis not present

## 2021-12-04 DIAGNOSIS — Z7901 Long term (current) use of anticoagulants: Secondary | ICD-10-CM | POA: Diagnosis not present

## 2021-12-04 DIAGNOSIS — K5190 Ulcerative colitis, unspecified, without complications: Secondary | ICD-10-CM | POA: Diagnosis not present

## 2021-12-04 DIAGNOSIS — R413 Other amnesia: Secondary | ICD-10-CM | POA: Diagnosis not present

## 2021-12-04 DIAGNOSIS — I4819 Other persistent atrial fibrillation: Secondary | ICD-10-CM | POA: Diagnosis not present

## 2021-12-04 DIAGNOSIS — Z9181 History of falling: Secondary | ICD-10-CM | POA: Diagnosis not present

## 2021-12-04 DIAGNOSIS — H3530 Unspecified macular degeneration: Secondary | ICD-10-CM | POA: Diagnosis not present

## 2021-12-04 DIAGNOSIS — M810 Age-related osteoporosis without current pathological fracture: Secondary | ICD-10-CM | POA: Diagnosis not present

## 2021-12-04 DIAGNOSIS — I10 Essential (primary) hypertension: Secondary | ICD-10-CM | POA: Diagnosis not present

## 2021-12-04 DIAGNOSIS — E7800 Pure hypercholesterolemia, unspecified: Secondary | ICD-10-CM | POA: Diagnosis not present

## 2021-12-06 DIAGNOSIS — I10 Essential (primary) hypertension: Secondary | ICD-10-CM | POA: Diagnosis not present

## 2021-12-06 DIAGNOSIS — K5190 Ulcerative colitis, unspecified, without complications: Secondary | ICD-10-CM | POA: Diagnosis not present

## 2021-12-06 DIAGNOSIS — E039 Hypothyroidism, unspecified: Secondary | ICD-10-CM | POA: Diagnosis not present

## 2021-12-06 DIAGNOSIS — H3530 Unspecified macular degeneration: Secondary | ICD-10-CM | POA: Diagnosis not present

## 2021-12-06 DIAGNOSIS — E7800 Pure hypercholesterolemia, unspecified: Secondary | ICD-10-CM | POA: Diagnosis not present

## 2021-12-06 DIAGNOSIS — I4819 Other persistent atrial fibrillation: Secondary | ICD-10-CM | POA: Diagnosis not present

## 2021-12-11 DIAGNOSIS — Z20822 Contact with and (suspected) exposure to covid-19: Secondary | ICD-10-CM | POA: Diagnosis not present

## 2021-12-13 DIAGNOSIS — K5190 Ulcerative colitis, unspecified, without complications: Secondary | ICD-10-CM | POA: Diagnosis not present

## 2021-12-13 DIAGNOSIS — I4819 Other persistent atrial fibrillation: Secondary | ICD-10-CM | POA: Diagnosis not present

## 2021-12-13 DIAGNOSIS — H3530 Unspecified macular degeneration: Secondary | ICD-10-CM | POA: Diagnosis not present

## 2021-12-13 DIAGNOSIS — E7800 Pure hypercholesterolemia, unspecified: Secondary | ICD-10-CM | POA: Diagnosis not present

## 2021-12-13 DIAGNOSIS — I10 Essential (primary) hypertension: Secondary | ICD-10-CM | POA: Diagnosis not present

## 2021-12-13 DIAGNOSIS — E039 Hypothyroidism, unspecified: Secondary | ICD-10-CM | POA: Diagnosis not present

## 2021-12-18 DIAGNOSIS — Z20822 Contact with and (suspected) exposure to covid-19: Secondary | ICD-10-CM | POA: Diagnosis not present

## 2021-12-21 DIAGNOSIS — H3530 Unspecified macular degeneration: Secondary | ICD-10-CM | POA: Diagnosis not present

## 2021-12-21 DIAGNOSIS — K5190 Ulcerative colitis, unspecified, without complications: Secondary | ICD-10-CM | POA: Diagnosis not present

## 2021-12-21 DIAGNOSIS — E7800 Pure hypercholesterolemia, unspecified: Secondary | ICD-10-CM | POA: Diagnosis not present

## 2021-12-21 DIAGNOSIS — I10 Essential (primary) hypertension: Secondary | ICD-10-CM | POA: Diagnosis not present

## 2021-12-21 DIAGNOSIS — E039 Hypothyroidism, unspecified: Secondary | ICD-10-CM | POA: Diagnosis not present

## 2021-12-21 DIAGNOSIS — I4819 Other persistent atrial fibrillation: Secondary | ICD-10-CM | POA: Diagnosis not present

## 2021-12-25 DIAGNOSIS — Z7901 Long term (current) use of anticoagulants: Secondary | ICD-10-CM | POA: Diagnosis not present

## 2021-12-25 DIAGNOSIS — I10 Essential (primary) hypertension: Secondary | ICD-10-CM | POA: Diagnosis not present

## 2021-12-25 DIAGNOSIS — H8109 Meniere's disease, unspecified ear: Secondary | ICD-10-CM | POA: Diagnosis not present

## 2021-12-25 DIAGNOSIS — Z9181 History of falling: Secondary | ICD-10-CM | POA: Diagnosis not present

## 2021-12-25 DIAGNOSIS — R413 Other amnesia: Secondary | ICD-10-CM | POA: Diagnosis not present

## 2021-12-25 DIAGNOSIS — K5190 Ulcerative colitis, unspecified, without complications: Secondary | ICD-10-CM | POA: Diagnosis not present

## 2021-12-25 DIAGNOSIS — E039 Hypothyroidism, unspecified: Secondary | ICD-10-CM | POA: Diagnosis not present

## 2021-12-25 DIAGNOSIS — E7800 Pure hypercholesterolemia, unspecified: Secondary | ICD-10-CM | POA: Diagnosis not present

## 2021-12-25 DIAGNOSIS — H3530 Unspecified macular degeneration: Secondary | ICD-10-CM | POA: Diagnosis not present

## 2021-12-25 DIAGNOSIS — I4819 Other persistent atrial fibrillation: Secondary | ICD-10-CM | POA: Diagnosis not present

## 2021-12-25 DIAGNOSIS — Z432 Encounter for attention to ileostomy: Secondary | ICD-10-CM | POA: Diagnosis not present

## 2021-12-25 DIAGNOSIS — M810 Age-related osteoporosis without current pathological fracture: Secondary | ICD-10-CM | POA: Diagnosis not present

## 2021-12-27 DIAGNOSIS — H3530 Unspecified macular degeneration: Secondary | ICD-10-CM | POA: Diagnosis not present

## 2021-12-27 DIAGNOSIS — E039 Hypothyroidism, unspecified: Secondary | ICD-10-CM | POA: Diagnosis not present

## 2021-12-27 DIAGNOSIS — E7800 Pure hypercholesterolemia, unspecified: Secondary | ICD-10-CM | POA: Diagnosis not present

## 2021-12-27 DIAGNOSIS — I10 Essential (primary) hypertension: Secondary | ICD-10-CM | POA: Diagnosis not present

## 2021-12-27 DIAGNOSIS — K5190 Ulcerative colitis, unspecified, without complications: Secondary | ICD-10-CM | POA: Diagnosis not present

## 2021-12-27 DIAGNOSIS — I4819 Other persistent atrial fibrillation: Secondary | ICD-10-CM | POA: Diagnosis not present

## 2022-01-03 DIAGNOSIS — E7800 Pure hypercholesterolemia, unspecified: Secondary | ICD-10-CM | POA: Diagnosis not present

## 2022-01-03 DIAGNOSIS — I10 Essential (primary) hypertension: Secondary | ICD-10-CM | POA: Diagnosis not present

## 2022-01-03 DIAGNOSIS — I4819 Other persistent atrial fibrillation: Secondary | ICD-10-CM | POA: Diagnosis not present

## 2022-01-03 DIAGNOSIS — E039 Hypothyroidism, unspecified: Secondary | ICD-10-CM | POA: Diagnosis not present

## 2022-01-03 DIAGNOSIS — K5190 Ulcerative colitis, unspecified, without complications: Secondary | ICD-10-CM | POA: Diagnosis not present

## 2022-01-03 DIAGNOSIS — H3530 Unspecified macular degeneration: Secondary | ICD-10-CM | POA: Diagnosis not present

## 2022-01-07 ENCOUNTER — Other Ambulatory Visit: Payer: Self-pay | Admitting: Nurse Practitioner

## 2022-01-10 DIAGNOSIS — K5190 Ulcerative colitis, unspecified, without complications: Secondary | ICD-10-CM | POA: Diagnosis not present

## 2022-01-10 DIAGNOSIS — H3530 Unspecified macular degeneration: Secondary | ICD-10-CM | POA: Diagnosis not present

## 2022-01-10 DIAGNOSIS — I10 Essential (primary) hypertension: Secondary | ICD-10-CM | POA: Diagnosis not present

## 2022-01-10 DIAGNOSIS — E039 Hypothyroidism, unspecified: Secondary | ICD-10-CM | POA: Diagnosis not present

## 2022-01-10 DIAGNOSIS — E7800 Pure hypercholesterolemia, unspecified: Secondary | ICD-10-CM | POA: Diagnosis not present

## 2022-01-10 DIAGNOSIS — I4819 Other persistent atrial fibrillation: Secondary | ICD-10-CM | POA: Diagnosis not present

## 2022-01-17 ENCOUNTER — Other Ambulatory Visit: Payer: Self-pay | Admitting: Nurse Practitioner

## 2022-01-21 DIAGNOSIS — I10 Essential (primary) hypertension: Secondary | ICD-10-CM | POA: Diagnosis not present

## 2022-01-21 DIAGNOSIS — H3530 Unspecified macular degeneration: Secondary | ICD-10-CM | POA: Diagnosis not present

## 2022-01-21 DIAGNOSIS — E7800 Pure hypercholesterolemia, unspecified: Secondary | ICD-10-CM | POA: Diagnosis not present

## 2022-01-21 DIAGNOSIS — K5190 Ulcerative colitis, unspecified, without complications: Secondary | ICD-10-CM | POA: Diagnosis not present

## 2022-01-21 DIAGNOSIS — E039 Hypothyroidism, unspecified: Secondary | ICD-10-CM | POA: Diagnosis not present

## 2022-01-21 DIAGNOSIS — I4819 Other persistent atrial fibrillation: Secondary | ICD-10-CM | POA: Diagnosis not present

## 2022-02-10 ENCOUNTER — Encounter (HOSPITAL_BASED_OUTPATIENT_CLINIC_OR_DEPARTMENT_OTHER): Payer: Self-pay

## 2022-02-10 DIAGNOSIS — D485 Neoplasm of uncertain behavior of skin: Secondary | ICD-10-CM | POA: Diagnosis not present

## 2022-02-10 DIAGNOSIS — L570 Actinic keratosis: Secondary | ICD-10-CM | POA: Diagnosis not present

## 2022-02-10 DIAGNOSIS — Z85828 Personal history of other malignant neoplasm of skin: Secondary | ICD-10-CM | POA: Diagnosis not present

## 2022-02-10 DIAGNOSIS — B078 Other viral warts: Secondary | ICD-10-CM | POA: Diagnosis not present

## 2022-02-10 DIAGNOSIS — C44729 Squamous cell carcinoma of skin of left lower limb, including hip: Secondary | ICD-10-CM | POA: Diagnosis not present

## 2022-02-14 NOTE — Telephone Encounter (Signed)
Left voicemail for patient to call back and schedule injection.

## 2022-02-17 ENCOUNTER — Other Ambulatory Visit: Payer: Self-pay | Admitting: Vascular Surgery

## 2022-02-21 ENCOUNTER — Ambulatory Visit: Payer: Medicare Other | Admitting: Podiatry

## 2022-02-23 ENCOUNTER — Encounter: Payer: Self-pay | Admitting: Podiatry

## 2022-02-23 ENCOUNTER — Ambulatory Visit (INDEPENDENT_AMBULATORY_CARE_PROVIDER_SITE_OTHER): Payer: Medicare Other | Admitting: Podiatry

## 2022-02-23 DIAGNOSIS — M79675 Pain in left toe(s): Secondary | ICD-10-CM | POA: Diagnosis not present

## 2022-02-23 DIAGNOSIS — M79674 Pain in right toe(s): Secondary | ICD-10-CM

## 2022-02-23 DIAGNOSIS — B351 Tinea unguium: Secondary | ICD-10-CM

## 2022-02-27 NOTE — Progress Notes (Signed)
  Subjective:  Patient ID: Jasmine Mccormick, female    DOB: 17-Sep-1933,  MRN: [id number]  Jasmine Mccormick presents to clinic today for painful thick toenails that are difficult to trim. Pain interferes with ambulation. Aggravating factors include wearing enclosed shoe gear. Pain is relieved with periodic professional debridement.  New problem(s): None.   PCP is Plotnikov, Evie Lacks, MD , and last visit was  August 30, 2021  No Known Allergies  Review of Systems: Negative except as noted in the HPI.  Objective: No changes noted in today's physical examination. General: Patient is a pleasant 88 y.o. Caucasian female WD, WN in NAD. AAO x 3.   Neurovascular Examination: CFT <3 seconds b/l LE. Palpable DP/PT pulses b/l LE. Digital hair sparse b/l. Skin temperature gradient WNL b/l. No pain with calf compression b/l. No edema noted b/l. No cyanosis or clubbing noted b/l LE.  Protective sensation intact 5/5 intact bilaterally with 10g monofilament b/l.  Dermatological:  No open wounds b/l LE. No interdigital macerations noted b/l LE. Toenails 1-5 bilaterally elongated, discolored, dystrophic, thickened, and crumbly with subungual debris and tenderness to dorsal palpation.  Musculoskeletal:  Normal muscle strength 5/5 to all lower extremity muscle groups bilaterally. No pain, crepitus or joint limitation noted with ROM b/l LE. No gross bony pedal deformities b/l. Patient ambulates independently without assistive aids.  Assessment/Plan: 1. Pain due to onychomycosis of toenails of both feet   -Examined patient. -No new findings. No new orders. -Patient to continue soft, supportive shoe gear daily. -Mycotic toenails 1-5 bilaterally were debrided in length and girth with sterile nail nippers and dremel without incident. -Patient/POA to call should there be question/concern in the interim.   Return in about 3 months (around 05/26/2022).  Marzetta Board, DPM

## 2022-03-04 ENCOUNTER — Other Ambulatory Visit: Payer: Self-pay | Admitting: Internal Medicine

## 2022-04-04 ENCOUNTER — Encounter: Payer: Self-pay | Admitting: Internal Medicine

## 2022-04-04 NOTE — Telephone Encounter (Signed)
Spoke with pt's niece and she stated she thinks pt may be having increased output in her ostomy bag and stool appears to be more liquidy. Pt's niece denies any weakness, dizziness, or decreased oral intake. Niece also denies any blood in stool. Pt's niece also wanted to know if there was any possible way to get home care specific to pt's ileostomy. Pt's niece states that pt is constantly messing with ostomy bag and since pt has started having memory problems, niece reports that pt has fixated on bag. Pt scheduled for office appointment with Tye Savoy, NP on 04/26/22 at 1:30 pm. Pt's niece verbalized understanding and had no other concerns at end of call.

## 2022-04-09 ENCOUNTER — Other Ambulatory Visit: Payer: Self-pay | Admitting: Internal Medicine

## 2022-04-11 ENCOUNTER — Other Ambulatory Visit: Payer: Self-pay | Admitting: Internal Medicine

## 2022-04-11 ENCOUNTER — Encounter: Payer: Self-pay | Admitting: Internal Medicine

## 2022-04-11 ENCOUNTER — Ambulatory Visit (INDEPENDENT_AMBULATORY_CARE_PROVIDER_SITE_OTHER): Payer: Medicare Other | Admitting: Internal Medicine

## 2022-04-11 VITALS — BP 168/70 | HR 54 | Temp 97.8°F | Ht 66.0 in | Wt 139.4 lb

## 2022-04-11 DIAGNOSIS — R202 Paresthesia of skin: Secondary | ICD-10-CM | POA: Diagnosis not present

## 2022-04-11 DIAGNOSIS — K51818 Other ulcerative colitis with other complication: Secondary | ICD-10-CM | POA: Diagnosis not present

## 2022-04-11 DIAGNOSIS — I480 Paroxysmal atrial fibrillation: Secondary | ICD-10-CM

## 2022-04-11 DIAGNOSIS — R413 Other amnesia: Secondary | ICD-10-CM | POA: Diagnosis not present

## 2022-04-11 DIAGNOSIS — I10 Essential (primary) hypertension: Secondary | ICD-10-CM

## 2022-04-11 DIAGNOSIS — K922 Gastrointestinal hemorrhage, unspecified: Secondary | ICD-10-CM | POA: Diagnosis not present

## 2022-04-11 DIAGNOSIS — N390 Urinary tract infection, site not specified: Secondary | ICD-10-CM | POA: Diagnosis not present

## 2022-04-11 DIAGNOSIS — E559 Vitamin D deficiency, unspecified: Secondary | ICD-10-CM

## 2022-04-11 LAB — URINALYSIS, ROUTINE W REFLEX MICROSCOPIC
Bilirubin Urine: NEGATIVE
Ketones, ur: NEGATIVE
Nitrite: NEGATIVE
Specific Gravity, Urine: 1.02 (ref 1.000–1.030)
Total Protein, Urine: 30 — AB
Urine Glucose: NEGATIVE
Urobilinogen, UA: 0.2 (ref 0.0–1.0)
pH: 6 (ref 5.0–8.0)

## 2022-04-11 LAB — COMPREHENSIVE METABOLIC PANEL
ALT: 29 U/L (ref 0–35)
AST: 30 U/L (ref 0–37)
Albumin: 4.2 g/dL (ref 3.5–5.2)
Alkaline Phosphatase: 75 U/L (ref 39–117)
BUN: 20 mg/dL (ref 6–23)
CO2: 26 mEq/L (ref 19–32)
Calcium: 10 mg/dL (ref 8.4–10.5)
Chloride: 101 mEq/L (ref 96–112)
Creatinine, Ser: 1.14 mg/dL (ref 0.40–1.20)
GFR: 43.78 mL/min — ABNORMAL LOW (ref >60.00)
Glucose, Bld: 82 mg/dL (ref 70–99)
Potassium: 4.3 mEq/L (ref 3.5–5.1)
Sodium: 138 mEq/L (ref 135–145)
Total Bilirubin: 0.5 mg/dL (ref 0.2–1.2)
Total Protein: 7.3 g/dL (ref 6.0–8.3)

## 2022-04-11 LAB — CBC WITH DIFFERENTIAL/PLATELET
Basophils Absolute: 0 10*3/uL (ref 0.0–0.1)
Basophils Relative: 0.3 % (ref 0.0–3.0)
Eosinophils Absolute: 0.1 10*3/uL (ref 0.0–0.7)
Eosinophils Relative: 1.3 % (ref 0.0–5.0)
HCT: 40.5 % (ref 36.0–46.0)
Hemoglobin: 13.7 g/dL (ref 12.0–15.0)
Lymphocytes Relative: 19.6 % (ref 12.0–46.0)
Lymphs Abs: 1.4 10*3/uL (ref 0.7–4.0)
MCHC: 33.8 g/dL (ref 30.0–36.0)
MCV: 99.1 fl (ref 78.0–100.0)
Monocytes Absolute: 0.8 10*3/uL (ref 0.1–1.0)
Monocytes Relative: 11.7 % (ref 3.0–12.0)
Neutro Abs: 4.8 10*3/uL (ref 1.4–7.7)
Neutrophils Relative %: 67.1 % (ref 43.0–77.0)
Platelets: 210 10*3/uL (ref 150.0–400.0)
RBC: 4.09 Mil/uL (ref 3.87–5.11)
RDW: 14.2 % (ref 11.5–15.5)
WBC: 7.2 10*3/uL (ref 4.0–10.5)

## 2022-04-11 LAB — TSH: TSH: 2.72 u[IU]/mL (ref 0.35–5.50)

## 2022-04-11 LAB — VITAMIN D 25 HYDROXY (VIT D DEFICIENCY, FRACTURES): VITD: 26.9 ng/mL — ABNORMAL LOW (ref 30.00–100.00)

## 2022-04-11 LAB — VITAMIN B12: Vitamin B-12: 303 pg/mL (ref 211–911)

## 2022-04-11 MED ORDER — VITAMIN D3 50 MCG (2000 UT) PO CAPS: 2000.0000 [IU] | ORAL_CAPSULE | Freq: Every day | ORAL | 3 refills | Status: DC

## 2022-04-11 MED ORDER — SIMVASTATIN 20 MG PO TABS: 20.0000 mg | ORAL_TABLET | Freq: Every day | ORAL | 1 refills | Status: DC

## 2022-04-11 MED ORDER — CIPROFLOXACIN HCL 250 MG PO TABS: 250.0000 mg | ORAL_TABLET | Freq: Two times a day (BID) | ORAL | 0 refills | Status: DC

## 2022-04-11 NOTE — Assessment & Plan Note (Signed)
New.  Cipro prescription

## 2022-04-11 NOTE — Progress Notes (Signed)
Subjective:  Patient ID: Jasmine Mccormick, female    DOB: 11/04/1933  Age: 88 y.o. MRN: [id number]  CC: Follow-up (Niece states pt goes a lot at home want to get urine check. Tried to give specimen wasn't able too) and Memory Loss (Starting to forget alot)   HPI Jasmine Mccormick presents for memory loss, stoma problems, PAF.  She is here with her niece Jackelyn Poling who helps with history.  Jackelyn Poling is concerned about memory loss, occasional confusion.  The patient lives alone.  Jackelyn Poling is seeing her every day and checking on her twice a day.  Outpatient Medications Prior to Visit  Medication Sig Dispense Refill   amiodarone (PACERONE) 200 MG tablet Take 1 tablet (200 mg total) by mouth daily. 90 tablet 3   apixaban (ELIQUIS) 5 MG TABS tablet Take 1 tablet (5 mg total) by mouth 2 (two) times daily. 90 tablet 3   ferrous sulfate 325 (65 FE) MG tablet Take 1 tablet (325 mg total) by mouth daily with breakfast. 30 tablet 3   metoprolol succinate (TOPROL-XL) 50 MG 24 hr tablet Take 1 tablet (50 mg total) by mouth daily. Take with or immediately following a meal. 90 tablet 3   Multiple Vitamins-Minerals (PRESERVISION AREDS 2 PO) Take by mouth 2 (two) times daily.     omeprazole (PRILOSEC) 40 MG capsule Take 1 capsule (40 mg total) by mouth 2 (two) times daily before a meal. **PLEASE CALL OFFICE AND SCHEDULE FOLLOW UP 60 capsule 0   triamcinolone ointment (KENALOG) 0.1 % Apply 1 application topically 3 (three) times daily as needed. 80 g 3   ezetimibe (ZETIA) 10 MG tablet Take 1 tablet (10 mg total) by mouth daily. Annual appt is due in must see provider for future refills 30 tablet 0   levothyroxine (SYNTHROID) 75 MCG tablet TAKE 1 TABLET BY MOUTH EVERY DAY 90 tablet 1   simvastatin (ZOCOR) 20 MG tablet TAKE 1 TABLET BY MOUTH EVERY DAY IN THE EVENING 30 tablet 3   B Complex Vitamins (VITAMIN B-COMPLEX) TABS SMARTSIG:1 By Mouth (Patient not taking: Reported on 04/11/2022)     calcium carbonate (TUMS -  DOSED IN MG ELEMENTAL CALCIUM) 500 MG chewable tablet Chew 1 tablet by mouth as needed for indigestion or heartburn. (Patient not taking: Reported on 04/11/2022)     D 1000 25 MCG (1000 UT) capsule SMARTSIG:1 By Mouth (Patient not taking: Reported on 04/11/2022)     furosemide (LASIX) 20 MG tablet TAKE 1 TABLET BY MOUTH EVERY DAY (Patient not taking: Reported on 04/11/2022) 90 tablet 3   SYNTHROID 50 MCG tablet SMARTSIG:1 By Mouth (Patient not taking: Reported on 04/11/2022)     No facility-administered medications prior to visit.    ROS: Review of Systems  Constitutional:  Positive for fatigue. Negative for activity change, appetite change, chills and unexpected weight change.  HENT:  Negative for congestion, mouth sores and sinus pressure.   Eyes:  Negative for visual disturbance.  Respiratory:  Negative for cough and chest tightness.   Gastrointestinal:  Negative for abdominal pain and nausea.  Genitourinary:  Negative for difficulty urinating, frequency and vaginal pain.  Musculoskeletal:  Negative for back pain and gait problem.  Skin:  Negative for pallor and rash.  Neurological:  Negative for dizziness, tremors, weakness, numbness and headaches.  Psychiatric/Behavioral:  Positive for decreased concentration. Negative for confusion, sleep disturbance and suicidal ideas. The patient is not nervous/anxious.     Objective:  BP (!) 168/70 (BP Location: Left  Arm)   Pulse (!) 54   Temp 97.8 F (36.6 C) (Oral)   Ht 5' 6"  (1.676 m)   Wt 139 lb 6.4 oz (63.2 kg)   LMP  (LMP Unknown)   SpO2 96%   BMI 22.50 kg/m   BP Readings from Last 3 Encounters:  04/11/22 (!) 168/70  11/13/21 (!) 192/96  11/10/21 (!) 164/80    Wt Readings from Last 3 Encounters:  04/11/22 139 lb 6.4 oz (63.2 kg)  11/13/21 146 lb 6.2 oz (66.4 kg)  11/10/21 146 lb 6.4 oz (66.4 kg)    Physical Exam Constitutional:      General: She is not in acute distress.    Appearance: She is well-developed.  HENT:      Head: Normocephalic.     Right Ear: External ear normal.     Left Ear: External ear normal.     Nose: Nose normal.  Eyes:     General:        Right eye: No discharge.        Left eye: No discharge.     Conjunctiva/sclera: Conjunctivae normal.     Pupils: Pupils are equal, round, and reactive to light.  Neck:     Thyroid: No thyromegaly.     Vascular: No JVD.     Trachea: No tracheal deviation.  Cardiovascular:     Rate and Rhythm: Normal rate and regular rhythm.     Heart sounds: Normal heart sounds.  Pulmonary:     Effort: No respiratory distress.     Breath sounds: No stridor. No wheezing.  Abdominal:     General: Bowel sounds are normal. There is no distension.     Palpations: Abdomen is soft. There is no mass.     Tenderness: There is no abdominal tenderness. There is no guarding or rebound.  Musculoskeletal:        General: Tenderness present.     Cervical back: Normal range of motion and neck supple. No rigidity.  Lymphadenopathy:     Cervical: No cervical adenopathy.  Skin:    Findings: No erythema or rash.  Neurological:     Cranial Nerves: No cranial nerve deficit.     Motor: No abnormal muscle tone.     Coordination: Coordination normal.     Gait: Gait abnormal.     Deep Tendon Reflexes: Reflexes normal.  Psychiatric:        Behavior: Behavior normal.        Thought Content: Thought content normal.        Judgment: Judgment normal.   Using a cane Stoma     A total time of 45 minutes was spent preparing to see the patient, reviewing tests, x-rays, operative reports and other medical records.  Also, obtaining history from the pt and Debbie and performing comprehensive physical exam.  Additionally, counseling the patient regarding memory issues, long-term planning, ie assisted living etc.  Finally, documenting clinical information in the health records, coordination of care, educating the patient. It is a complex case.  Lab Results  Component Value Date   WBC  7.2 04/11/2022   HGB 13.7 04/11/2022   HCT 40.5 04/11/2022   PLT 210.0 04/11/2022   GLUCOSE 82 04/11/2022   CHOL 122 04/01/2021   TRIG 119 04/01/2021   HDL 38 (L) 04/01/2021   LDLCALC 60 04/01/2021   ALT 29 04/11/2022   AST 30 04/11/2022   NA 138 04/11/2022   K 4.3 04/11/2022   CL 101 04/11/2022  CREATININE 1.14 04/11/2022   BUN 20 04/11/2022   CO2 26 04/11/2022   TSH 2.72 04/11/2022   INR 2.8 11/01/2021   HGBA1C 6.2 09/17/2020    No results found.  Assessment & Plan:   Problem List Items Addressed This Visit     Essential hypertension    Cont on Metoprolol, Verapamil, Aceon      Relevant Orders   Comprehensive metabolic panel (Completed)   GI bleeding    Bleeding from ostomy.  Follow-up with GI      Memory problem    Probable Alzheimer's dementia.  We discussed the issue with the patient and her niece.  I suggested that they will start looking at [facility]/assisted living facilities for seniors.  They can look at : A[employer] 2 way cameras Smart speakers Medical alarm button      Paroxysmal atrial fibrillation (DISH) - Primary    F/u w/Dr Harrell Gave      Relevant Orders   TSH (Completed)   CBC with Differential/Platelet (Completed)   Comprehensive metabolic panel (Completed)   Urinalysis   Ulcerative colitis ([place])   Relevant Orders   TSH (Completed)   CBC with Differential/Platelet (Completed)   Comprehensive metabolic panel (Completed)   Urinalysis   UTI (urinary tract infection)    New.  Cipro prescription      Vitamin D deficiency    Low vitamin D.  Restart vitamin D or double intake if vitamin D      Relevant Orders   VITAMIN D 25 Hydroxy (Vit-D Deficiency, Fractures) (Completed)   Other Visit Diagnoses     Paresthesia       Relevant Orders   Vitamin B12 (Completed)         Meds ordered this encounter  Medications   ciprofloxacin (CIPRO) 250 MG tablet    Sig: Take 1 tablet (250 mg total) by mouth 2 (two) times daily.     Dispense:  14 tablet    Refill:  0   Cholecalciferol (VITAMIN D3) 50 MCG (2000 UT) capsule    Sig: Take 1 capsule (2,000 Units total) by mouth daily.    Dispense:  100 capsule    Refill:  3      Follow-up: Return in about 3 months (around 07/12/2022) for a follow-up visit.  Walker Kehr, MD

## 2022-04-11 NOTE — Assessment & Plan Note (Signed)
Bleeding from ostomy.  Follow-up with GI

## 2022-04-11 NOTE — Assessment & Plan Note (Signed)
Cont on Metoprolol, Verapamil, Aceon

## 2022-04-11 NOTE — Assessment & Plan Note (Signed)
F/u w/Dr Harrell Gave

## 2022-04-11 NOTE — Assessment & Plan Note (Signed)
Probable Alzheimer's dementia.  We discussed the issue with the patient and her niece.  I suggested that they will start looking at [place] living/assisted living facilities for seniors.  They can look at : [employer] 2 way cameras Smart speakers Medical alarm button

## 2022-04-11 NOTE — Assessment & Plan Note (Signed)
Low vitamin D.  Restart vitamin D or double intake if vitamin D

## 2022-04-11 NOTE — Patient Instructions (Signed)
ARLO 2 way cameras Smart speakers

## 2022-04-26 ENCOUNTER — Ambulatory Visit (INDEPENDENT_AMBULATORY_CARE_PROVIDER_SITE_OTHER): Payer: Medicare Other | Admitting: Nurse Practitioner

## 2022-04-26 ENCOUNTER — Other Ambulatory Visit (INDEPENDENT_AMBULATORY_CARE_PROVIDER_SITE_OTHER): Payer: Medicare Other

## 2022-04-26 ENCOUNTER — Encounter: Payer: Self-pay | Admitting: Nurse Practitioner

## 2022-04-26 ENCOUNTER — Encounter: Payer: Self-pay | Admitting: Internal Medicine

## 2022-04-26 VITALS — BP 124/70 | HR 51 | Ht 62.5 in | Wt 140.5 lb

## 2022-04-26 DIAGNOSIS — D509 Iron deficiency anemia, unspecified: Secondary | ICD-10-CM

## 2022-04-26 DIAGNOSIS — Z23 Encounter for immunization: Secondary | ICD-10-CM | POA: Diagnosis not present

## 2022-04-26 LAB — IBC + FERRITIN
Ferritin: 134.6 ng/mL (ref 10.0–291.0)
Iron: 112 ug/dL (ref 42–145)
Saturation Ratios: 38.1 % (ref 20.0–50.0)
TIBC: 294 ug/dL (ref 250.0–450.0)
Transferrin: 210 mg/dL — ABNORMAL LOW (ref 212.0–360.0)

## 2022-04-26 NOTE — Progress Notes (Signed)
I agree with the assessment and plan as outlined by Ms. Guenther.

## 2022-04-26 NOTE — Patient Instructions (Addendum)
We will contact you with lab results and further recommendations about oral iron  You can stop Omeprazole for now.   Your provider has requested that you go to the basement level for lab work before leaving today. Press "B" on the elevator. The lab is located at the first door on the left as you exit the elevator.  _______________________________________________________  If you are age 86 or older, your body mass index should be between 23-30. Your Body mass index is 25.29 kg/m. If this is out of the aforementioned range listed, please consider follow up with your Primary Care Provider.  If you are age 31 or younger, your body mass index should be between 19-25. Your Body mass index is 25.29 kg/m. If this is out of the aformentioned range listed, please consider follow up with your Primary Care Provider.   ________________________________________________________  The [facility] providers would like to encourage you to use [facility] to communicate with providers for non-urgent requests or questions.  Due to long hold times on the telephone, sending your provider a message by [facility] may be a faster and more efficient way to get a response.  Please allow 48 business hours for a response.  Please remember that this is for non-urgent requests.  _______________________________________________________  It was a pleasure to see you today!  Thank you for trusting me with your gastrointestinal care!

## 2022-04-26 NOTE — Progress Notes (Signed)
Chief Complaint:  follow up on anemia   Assessment &  Plan    # 88 yo female with with history of remote UC s/p total colectomy / end ileostomy with  recent iron deficiency anemia of unclear etiology. No overt GI bleeding but she does have intermittent peristomal bleeding. She has a large hiatal hernia seen on CT scan so perhaps she has occult bleeding from Cameron's erosions.  We held off on endoscopic evaluation due to age / co-morbidities. Plan was to she how she responded to iron.  She has been on oral iron for about 6 months and hemoglobin has normalized. Will check iron stores. If no longer iron deficient will consider seeing how she does off iron.   She was started empirically on omeprazole.  Her prescription ran out 2 months ago.  Will hold off on refilling PPI for now though if hgb begins to decline off iron then would resume PPI given large hiatal hernia on CT scan ( risk for Cameron's erosions).    HPI   Jasmine Mccormick is a 88 y.o. female known to Dr. Lorenso Courier with a past medical history significant for osteoporosis, HTN, hypothyroidism, COVID19, UC s/p total colectomy / end ileostomy, AFIB s/p cardioversion Nov 2022, SMA occlusion s/p thrombectomy, large hiatal hernia by CT scan.  See [name] /PSH for additional history   Patient established care here in 09/08/21 for evaluation of microcytic anemia.  Her hemoglobin had been normal but then declined while hospitalized with mesenteric ischemia secondary to SMA occlusion requiring mechanical thrombectomy.  When I saw her in clinic she described what sounded like peristomal bleeding the month prior. No blood in stool. I repeated labs, her hemoglobin was stable at 11.1 but she was clearly iron deficient.  Given her advanced age and multiple comorbidities we decided to hold off on endoscopic evaluation in favor of a trial of oral iron.  She was started empirically on omeprazole twice daily for 8 weeks.   Interval History:  Patient is here  with her niece [name]. Since I saw her in January she has had repeat labs on 3 occasions and her hemoglobin is up from 11.1 to 13.7 on daily iron. She was changed from Warfarin to Eliquis since I last saw her. Patient tells me she hasn't had any further peristomal bleeding but her niece [name] suggests there may still be some peristomal bleeding at times. Neither the patient nor her niece has seen any blood in ostomy output and stool is not black despite being on iron. No nausea / vomiting . Weight down unintentionally about 6 pounds since April but overall it is down about 15 pounds since Feb 2023. Her appetite is good.    Labs:     Latest Ref Rng & Units 04/11/2022   12:02 PM 11/13/2021    3:27 PM 10/22/2021   11:03 AM  CBC  WBC 4.0 - 10.5 K/uL 7.2  5.7  6.2   Hemoglobin 12.0 - 15.0 g/dL 13.7  15.9  14.4   Hematocrit 36.0 - 46.0 % 40.5  50.1  45.5   Platelets 150.0 - 400.0 K/uL 210.0  190  258        Latest Ref Rng & Units 04/11/2022   12:02 PM 03/31/2021    4:15 PM 03/31/2021    4:36 AM  Hepatic Function  Total Protein 6.0 - 8.3 g/dL 7.3  5.1  4.6   Albumin 3.5 - 5.2 g/dL 4.2  2.3  2.2  AST 0 - 37 U/L 30  26  24    ALT 0 - 35 U/L 29  15  14    Alk Phosphatase 39 - 117 U/L 75  40  36   Total Bilirubin 0.2 - 1.2 mg/dL 0.5  0.9  0.8      Past Medical History:  Diagnosis Date   COVID 12/18/2020   mild   HTN (hypertension)    Normal at home   Hyperlipidemia    Hypothyroidism    Ileostomy in place [facility])    Memory loss    mild but not dx by doctor per Neice   Meniere disease 1996   Osteoporosis    UC (ulcerative colitis) ([place])     Past Surgical History:  Procedure Laterality Date   CARDIOVERSION N/A 06/15/2021   Procedure: CARDIOVERSION;  Surgeon: Werner Lean, MD;  Location: [place] [facility];  Service: Cardiovascular;  Laterality: N/A;   CARDIOVERSION N/A 10/26/2021   Procedure: CARDIOVERSION;  Surgeon: Buford Dresser, MD;  Location: [place];  Service:  Cardiovascular;  Laterality: N/A;   COLECTOMY     COLONOSCOPY     Ear Shunt for Menier's     ENDARTERECTOMY MESENTERIC Right 03/29/2021   Procedure: MESENTERIC ARTERY THROMBECTOMY;  Surgeon: Serafina Mitchell, MD;  Location: [place];  Service: Vascular;  Laterality: Right;   ILEOSTOMY     INTRAOPERATIVE ARTERIOGRAM Right 03/29/2021   Procedure: INTRA OPERATIVE AORTOGRAM;  Surgeon: Serafina Mitchell, MD;  Location: [facility];  Service: Vascular;  Laterality: Right;   KYPHOPLASTY N/A 01/07/2021   Procedure: Lumbar three Kyphoplasty;  Surgeon: Dawley, Theodoro Doing, DO;  Location: [place];  Service: Neurosurgery;  Laterality: N/A;   PROCTECTOMY     ULTRASOUND GUIDANCE FOR VASCULAR ACCESS Right 03/29/2021   Procedure: ULTRASOUND GUIDANCE FOR VASCULAR ACCESS;  Surgeon: Serafina Mitchell, MD;  Location: [facility];  Service: Vascular;  Laterality: Right;    Current Medications, Allergies, Family History and Social History were reviewed in [facility] record.     Current Outpatient Medications  Medication Sig Dispense Refill   amiodarone (PACERONE) 200 MG tablet Take 1 tablet (200 mg total) by mouth daily. 90 tablet 3   apixaban (ELIQUIS) 5 MG TABS tablet Take 1 tablet (5 mg total) by mouth 2 (two) times daily. 90 tablet 3   Cholecalciferol (VITAMIN D3) 50 MCG (2000 UT) capsule Take 1 capsule (2,000 Units total) by mouth daily. 100 capsule 3   ciprofloxacin (CIPRO) 250 MG tablet Take 1 tablet (250 mg total) by mouth 2 (two) times daily. 14 tablet 0   ezetimibe (ZETIA) 10 MG tablet Take 1 tablet (10 mg total) by mouth daily. 90 tablet 1   ferrous sulfate 325 (65 FE) MG tablet Take 1 tablet (325 mg total) by mouth daily with breakfast. 30 tablet 3   levothyroxine (SYNTHROID) 75 MCG tablet TAKE 1 TABLET BY MOUTH EVERY DAY 90 tablet 1   metoprolol succinate (TOPROL-XL) 50 MG 24 hr tablet Take 1 tablet (50 mg total) by mouth daily. Take with or immediately following a meal. 90 tablet 3   Multiple  Vitamins-Minerals (PRESERVISION AREDS 2 PO) Take by mouth 2 (two) times daily.     omeprazole (PRILOSEC) 40 MG capsule Take 1 capsule (40 mg total) by mouth 2 (two) times daily before a meal. **PLEASE CALL OFFICE AND SCHEDULE FOLLOW UP 60 capsule 0   simvastatin (ZOCOR) 20 MG tablet Take 1 tablet (20 mg total) by mouth daily at 6 PM.  90 tablet 1   triamcinolone ointment (KENALOG) 0.1 % Apply 1 application topically 3 (three) times daily as needed. 80 g 3   No current facility-administered medications for this visit.    Review of Systems: No chest pain. No shortness of breath. No urinary complaints.    Physical Exam  Wt Readings from Last 3 Encounters:  04/11/22 139 lb 6.4 oz (63.2 kg)  11/13/21 146 lb 6.2 oz (66.4 kg)  11/10/21 146 lb 6.4 oz (66.4 kg)    LMP  (LMP Unknown)  Constitutional:  Generally well appearing female in no acute distress. Psychiatric: Pleasant. Normal mood and affect. Behavior is normal. EENT: Pupils normal.  Conjunctivae are normal. No scleral icterus. Neck supple.  Cardiovascular: Normal rate, regular rhythm. No edema Pulmonary/chest: Effort normal and breath sounds normal. No wheezing, rales or rhonchi. Abdominal: Soft, nondistended, nontender. Bowel sounds active throughout. There are no masses palpable. No hepatomegaly.RLQ ileostomy. Stoma pink, oozing scant amount of greenish brown stool. No output in bag.  Neurological: Alert and oriented to person place and time. Skin: Skin is warm and dry. No rashes noted.  I spent 30 minutes total reviewing records, obtaining history, performing exam, counseling patient and documenting visit / findings.   Tye Savoy, NP  04/26/2022, 1:22 PM

## 2022-04-27 ENCOUNTER — Other Ambulatory Visit: Payer: Self-pay

## 2022-04-27 DIAGNOSIS — D509 Iron deficiency anemia, unspecified: Secondary | ICD-10-CM

## 2022-04-27 NOTE — Telephone Encounter (Signed)
This lady is asking about her prolia.. Checking on coverage.Marland KitchenJohny Chess

## 2022-05-11 ENCOUNTER — Encounter (HOSPITAL_BASED_OUTPATIENT_CLINIC_OR_DEPARTMENT_OTHER): Payer: Self-pay | Admitting: Cardiology

## 2022-05-11 ENCOUNTER — Ambulatory Visit (INDEPENDENT_AMBULATORY_CARE_PROVIDER_SITE_OTHER): Payer: Medicare Other | Admitting: Cardiology

## 2022-05-11 VITALS — BP 168/68 | HR 52 | Ht 62.5 in | Wt 142.1 lb

## 2022-05-11 DIAGNOSIS — Z7901 Long term (current) use of anticoagulants: Secondary | ICD-10-CM | POA: Diagnosis not present

## 2022-05-11 DIAGNOSIS — I480 Paroxysmal atrial fibrillation: Secondary | ICD-10-CM | POA: Diagnosis not present

## 2022-05-11 DIAGNOSIS — R600 Localized edema: Secondary | ICD-10-CM | POA: Diagnosis not present

## 2022-05-11 DIAGNOSIS — E7800 Pure hypercholesterolemia, unspecified: Secondary | ICD-10-CM | POA: Diagnosis not present

## 2022-05-11 DIAGNOSIS — D6869 Other thrombophilia: Secondary | ICD-10-CM

## 2022-05-11 MED ORDER — METOPROLOL SUCCINATE ER 25 MG PO TB24: 25.0000 mg | ORAL_TABLET | Freq: Every day | ORAL | 3 refills | Status: DC

## 2022-05-11 NOTE — Progress Notes (Signed)
Cardiology Office Note:    Date:  05/11/2022   ID:  Jasmine Mccormick, DOB [birth year], MRN [id number]  PCP:  Cassandria Anger, MD  Cardiologist:  Buford Dresser, MD  Referring MD: Cassandria Anger, MD   CC: follow up  History of Present Illness:    Jasmine Mccormick is a 88 y.o. female with a complex history, including ulcerative colitis s/p colectomy with ileostomy placement >20 years ago, HTN, and hypothyroidism, notable for distal SMA occlusion s/p thrombectomy, here for follow up. Found to have new atrial fibrillation during hospitalization 03/2021.   She presented to the ED 11/13/2021 with ileostomy site bleeding. She stated that her ostomy bag was still functioning. She reported having this site bleeding since the day prior. It was noted that she had started Eliquis 3 days prior after an A-fib diagnosis.   Cardiac History: She underwent cardioversion on 06/15/2021. She converted to NSR at 74 bpm. However, sinus rhythm did not hold. She was started on amiodarone. Repeat  cardioversion 10/2021.   Today:  She is accompanied by a family member. She says she has been feeling okay. She expresses no concern over her heart, lately.   Her blood pressure was elevated in clinic at 168/68 upon recheck. They have not been taking her blood pressure at home, but are considering starting.  Of note, two weeks ago, her iron looked good, so her iron pills were stopped.   She denies any palpitations, chest pain, shortness of breath, or peripheral edema. No lightheadedness, headaches, syncope, orthopnea, or PND.  Past Medical History:  Diagnosis Date   COVID 12/18/2020   mild   HTN (hypertension)    Normal at home   Hyperlipidemia    Hypothyroidism    Ileostomy in place [facility])    Memory loss    mild but not dx by doctor per [place]   Meniere disease 1996   Osteoporosis    UC (ulcerative colitis) ([place])     Past Surgical History:  Procedure Laterality Date   CARDIOVERSION  N/A 06/15/2021   Procedure: CARDIOVERSION;  Surgeon: Werner Lean, MD;  Location: [place] [facility];  Service: Cardiovascular;  Laterality: N/A;   CARDIOVERSION N/A 10/26/2021   Procedure: CARDIOVERSION;  Surgeon: Buford Dresser, MD;  Location: [place];  Service: Cardiovascular;  Laterality: N/A;   COLECTOMY     COLONOSCOPY     Ear Shunt for Menier's     ENDARTERECTOMY MESENTERIC Right 03/29/2021   Procedure: MESENTERIC ARTERY THROMBECTOMY;  Surgeon: Serafina Mitchell, MD;  Location: [place];  Service: Vascular;  Laterality: Right;   ILEOSTOMY     INTRAOPERATIVE ARTERIOGRAM Right 03/29/2021   Procedure: INTRA OPERATIVE AORTOGRAM;  Surgeon: Serafina Mitchell, MD;  Location: [place];  Service: Vascular;  Laterality: Right;   KYPHOPLASTY N/A 01/07/2021   Procedure: Lumbar three Kyphoplasty;  Surgeon: Dawley, Theodoro Doing, DO;  Location: [place];  Service: Neurosurgery;  Laterality: N/A;   PROCTECTOMY     ULTRASOUND GUIDANCE FOR VASCULAR ACCESS Right 03/29/2021   Procedure: ULTRASOUND GUIDANCE FOR VASCULAR ACCESS;  Surgeon: Serafina Mitchell, MD;  Location: [facility];  Service: Vascular;  Laterality: Right;    Current Medications: Current Outpatient Medications on File Prior to Visit  Medication Sig   amiodarone (PACERONE) 200 MG tablet Take 1 tablet (200 mg total) by mouth daily.   apixaban (ELIQUIS) 5 MG TABS tablet Take 1 tablet (5 mg total) by mouth 2 (two) times daily.   Cholecalciferol (VITAMIN D3) 50 MCG (2000  UT) capsule Take 1 capsule (2,000 Units total) by mouth daily. (Patient not taking: Reported on 04/26/2022)   ezetimibe (ZETIA) 10 MG tablet Take 1 tablet (10 mg total) by mouth daily.   ferrous sulfate 325 (65 FE) MG tablet Take 1 tablet (325 mg total) by mouth daily with breakfast. (Patient taking differently: Take 325 mg by mouth daily in the afternoon. At lunch)   levothyroxine (SYNTHROID) 75 MCG tablet TAKE 1 TABLET BY MOUTH EVERY DAY   metoprolol succinate (TOPROL-XL) 50 MG 24 hr  tablet Take 1 tablet (50 mg total) by mouth daily. Take with or immediately following a meal.   Multiple Vitamins-Minerals (PRESERVISION AREDS 2 PO) Take by mouth 2 (two) times daily.   omeprazole (PRILOSEC) 40 MG capsule Take 1 capsule (40 mg total) by mouth 2 (two) times daily before a meal. **PLEASE CALL OFFICE AND SCHEDULE FOLLOW UP   simvastatin (ZOCOR) 20 MG tablet Take 1 tablet (20 mg total) by mouth daily at 6 PM.   triamcinolone ointment (KENALOG) 0.1 % Apply 1 application topically 3 (three) times daily as needed.   No current facility-administered medications on file prior to visit.     Allergies:   Patient has no known allergies.   Social History   Tobacco Use   Smoking status: Never   Smokeless tobacco: Never  Vaping Use   Vaping Use: Never used  Substance Use Topics   Alcohol use: No   Drug use: No    Family History: family history includes Breast cancer in her sister; Heart disease in an other family member; Hypertension in an other family member.  ROS:   Please see the history of present illness.   Additional pertinent ROS otherwise unremarkable.   EKGs/Labs/Other Studies Reviewed:    The following studies were reviewed today:  Echocardiogram 03/30/21:  1. Patient in rapid afib during study.   2. Left ventricular ejection fraction, by estimation, is 60 to 65%. The  left ventricle has normal function. The left ventricle has no regional  wall motion abnormalities. Left ventricular diastolic parameters are  indeterminate.   3. Right ventricular systolic function is normal. The right ventricular  size is normal. There is normal pulmonary artery systolic pressure.   4. Left atrial size was moderately dilated.   5. Right atrial size was moderately dilated.   6. The mitral valve is degenerative. Trivial mitral valve regurgitation.  No evidence of mitral stenosis. Moderate mitral annular calcification.   7. The aortic valve is tricuspid. Aortic valve regurgitation  is not  visualized. Mild to moderate aortic valve sclerosis/calcification is  present, without any evidence of aortic stenosis.   8. The inferior vena cava is normal in size with greater than 50%  respiratory variability, suggesting right atrial pressure of 3 mmHg.  CTA Abdomen/Pelvis 03/29/2021: IMPRESSION: VASCULAR   1. 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 2. Moderate to marked severity diffuse calcification and atherosclerosis throughout the remaining arterial structures within the abdomen and pelvis.   NON-VASCULAR   1. 2 mm nonobstructing left renal stone. 2. Postoperative changes with a subsequent right lower quadrant ostomy site. 3. Large hiatal hernia.  EKG:  EKG is personally reviewed.   05/11/22: sinus bradycardia at 52 bpm 11/10/2021: NSR at 64 bpm 10/08/2021: atrial fibrillation at 86 bpm 08/24/2021: EKG was not ordered. 07/01/21: afib RVR at 116 bpm 04/15/21: afib RVR at 109 bpm  Recent Labs: 06/01/2021: BNP 325.9 04/11/2022: ALT 29; BUN 20; Creatinine, Ser  1.14; Hemoglobin 13.7; Platelets 210.0; Potassium 4.3; Sodium 138; TSH 2.72   Recent Lipid Panel    Component Value Date/Time   CHOL 122 04/01/2021 0410   TRIG 119 04/01/2021 0410   HDL 38 (L) 04/01/2021 0410   CHOLHDL 3.2 04/01/2021 0410   VLDL 24 04/01/2021 0410   LDLCALC 60 04/01/2021 0410    Physical Exam:    VS:  BP (!) 168/68 (BP Location: Left Arm, Patient Position: Sitting, Cuff Size: Normal)   Pulse (!) 52   Ht 5' 2.5" (1.588 m)   Wt 142 lb 1.6 oz (64.5 kg)   LMP  (LMP Unknown)   BMI 25.58 kg/m     Wt Readings from Last 3 Encounters:  05/11/22 142 lb 1.6 oz (64.5 kg)  04/26/22 140 lb 8 oz (63.7 kg)  04/11/22 139 lb 6.4 oz (63.2 kg)    GEN: Well nourished, well developed in no acute distress HEENT: Normal, moist mucous membranes NECK: No JVD CARDIAC: regular rhythm, normal S1 and S2, no rubs or gallops. No murmur. VASCULAR: Radial and  DP pulses 2+ bilaterally. No carotid bruits RESPIRATORY:  Clear to auscultation without rales, wheezing or rhonchi  ABDOMEN: Soft, non-tender, non-distended MUSCULOSKELETAL:  Ambulates independently SKIN: Warm and dry, bilateral mixed pitting and nonpitting LE edema NEUROLOGIC:  Alert and oriented x 3. No focal neuro deficits noted. PSYCHIATRIC:  Normal affect    ASSESSMENT:    1. Bilateral leg edema   2. Paroxysmal atrial fibrillation (HCC)   3. Secondary hypercoagulable state ([place])   4. Current use of long term anticoagulation   5. Pure hypercholesterolemia     PLAN:    Paroxysmal atrial fibrillation -tolerating amiodarone, in SR today -had sinus brady after cardioversion, changed metoprolol from 75 mg BID to 50 mg daily. She remains bradycardic, will decrease metoprolol succinate to 25 mg daily today -continue  apixaban, meets age but not weight or Cr for dose reduction -chadsvasc at least 4  LE edema: -likely component of both diastolic dysfunction and chronic venous insufficiency -Doesn't tolerate compression stockings or prolonged elevation per patient.  -Taking lasix PRN   Hypercholesterolemia: ok to continue simvastatin given age  Cardiac risk counseling and prevention recommendations: -recommend heart healthy/Mediterranean diet, with whole grains, fruits, vegetable, fish, lean meats, nuts, and olive oil. Limit salt. -recommend moderate walking, 3-5 times/week for 30-50 minutes each session. Aim for at least 150 minutes.week. Goal should be pace of 3 miles/hours, or walking 1.5 miles in 30 minutes -recommend avoidance of tobacco products. Avoid excess alcohol.  Plan for follow up: 6 months.  Buford Dresser, MD, PhD, [facility]    Medication Adjustments/Labs and Tests Ordered: Current medicines are reviewed at length with the patient today.  Concerns regarding medicines are outlined above.   Orders Placed This Encounter  Procedures    EKG 12-Lead   Meds ordered this encounter  Medications   metoprolol succinate (TOPROL-XL) 25 MG 24 hr tablet    Sig: Take 1 tablet (25 mg total) by mouth daily. Take with or immediately following a meal.    Dispense:  90 tablet    Refill:  3    Replaces 50 mg dose   Patient Instructions  Medication Instructions:  DECREASE: Metoprolol to 25 mg daily  *If you need a refill on your cardiac medications before your next appointment, please call your pharmacy*   Lab Work: None ordered today   Testing/Procedures: None ordered today   Follow-Up: At [facility], you and your health needs are our priority.  As part of our continuing mission to provide you with exceptional heart care, we have created designated Provider Care Teams.  These Care Teams include your primary Cardiologist (physician) and Advanced Practice Providers (APPs -  Physician Assistants and Nurse Practitioners) who all work together to provide you with the care you need, when you need it.  We recommend signing up for the patient portal called "MyChart".  Sign up information is provided on this After Visit Summary.  MyChart is used to connect with patients for Virtual Visits (Telemedicine).  Patients are able to view lab/test results, encounter notes, upcoming appointments, etc.  Non-urgent messages can be sent to your provider as well.   To learn more about what you can do with MyChart, go to [email or web address].    Your next appointment:   6 month(s)  The format for your next appointment:   In Person  Provider:   Buford Dresser, MD        I,Breanna Adamick,acting as a scribe for Buford Dresser, MD.,have documented all relevant documentation on the behalf of Buford Dresser, MD,as directed by  Buford Dresser, MD while in the presence of Buford Dresser, MD.  I, Buford Dresser, MD, have reviewed all documentation for this visit. The documentation on 05/11/22  for the exam, diagnosis, procedures, and orders are all accurate and complete.    Signed, Buford Dresser, MD PhD 05/11/2022     [place]

## 2022-05-11 NOTE — Patient Instructions (Signed)
Medication Instructions:  DECREASE: Metoprolol to 25 mg daily  *If you need a refill on your cardiac medications before your next appointment, please call your pharmacy*   Lab Work: None ordered today   Testing/Procedures: None ordered today   Follow-Up: At [facility], you and your health needs are our priority.  As part of our continuing mission to provide you with exceptional heart care, we have created designated Provider Care Teams.  These Care Teams include your primary Cardiologist (physician) and Advanced Practice Providers (APPs -  Physician Assistants and Nurse Practitioners) who all work together to provide you with the care you need, when you need it.  We recommend signing up for the patient portal called "MyChart".  Sign up information is provided on this After Visit Summary.  MyChart is used to connect with patients for Virtual Visits (Telemedicine).  Patients are able to view lab/test results, encounter notes, upcoming appointments, etc.  Non-urgent messages can be sent to your provider as well.   To learn more about what you can do with MyChart, go to [email or web address].    Your next appointment:   6 month(s)  The format for your next appointment:   In Person  Provider:   Buford Dresser, MD

## 2022-05-12 ENCOUNTER — Other Ambulatory Visit (HOSPITAL_BASED_OUTPATIENT_CLINIC_OR_DEPARTMENT_OTHER): Payer: Self-pay | Admitting: Cardiology

## 2022-05-12 DIAGNOSIS — I4819 Other persistent atrial fibrillation: Secondary | ICD-10-CM

## 2022-05-25 ENCOUNTER — Telehealth: Payer: Self-pay

## 2022-05-25 NOTE — Telephone Encounter (Signed)
Called pt's phone and spoke with pt's nephew. Let him know about lab work that is due. Pt's nephew verbalized understanding.

## 2022-05-25 NOTE — Telephone Encounter (Signed)
-----   Message from Marice Potter, RN sent at 04/27/2022  8:24 AM EDT ----- Pt needs repeat CBC. See 9/12 IBC and ferritin result note.  Orders in epic.

## 2022-05-26 ENCOUNTER — Other Ambulatory Visit (INDEPENDENT_AMBULATORY_CARE_PROVIDER_SITE_OTHER): Payer: Medicare Other

## 2022-05-26 DIAGNOSIS — D509 Iron deficiency anemia, unspecified: Secondary | ICD-10-CM

## 2022-05-26 LAB — CBC WITH DIFFERENTIAL/PLATELET
Basophils Absolute: 0 10*3/uL (ref 0.0–0.1)
Basophils Relative: 0.8 % (ref 0.0–3.0)
Eosinophils Absolute: 0.1 10*3/uL (ref 0.0–0.7)
Eosinophils Relative: 1.4 % (ref 0.0–5.0)
HCT: 41 % (ref 36.0–46.0)
Hemoglobin: 14 g/dL (ref 12.0–15.0)
Lymphocytes Relative: 13.8 % (ref 12.0–46.0)
Lymphs Abs: 0.8 10*3/uL (ref 0.7–4.0)
MCHC: 34.2 g/dL (ref 30.0–36.0)
MCV: 97.6 fl (ref 78.0–100.0)
Monocytes Absolute: 0.7 10*3/uL (ref 0.1–1.0)
Monocytes Relative: 11.3 % (ref 3.0–12.0)
Neutro Abs: 4.3 10*3/uL (ref 1.4–7.7)
Neutrophils Relative %: 72.7 % (ref 43.0–77.0)
Platelets: 176 10*3/uL (ref 150.0–400.0)
RBC: 4.2 Mil/uL (ref 3.87–5.11)
RDW: 12.9 % (ref 11.5–15.5)
WBC: 5.9 10*3/uL (ref 4.0–10.5)

## 2022-06-02 ENCOUNTER — Other Ambulatory Visit: Payer: Self-pay

## 2022-06-02 DIAGNOSIS — D509 Iron deficiency anemia, unspecified: Secondary | ICD-10-CM

## 2022-06-03 ENCOUNTER — Encounter: Payer: Self-pay | Admitting: Podiatry

## 2022-06-03 ENCOUNTER — Ambulatory Visit (INDEPENDENT_AMBULATORY_CARE_PROVIDER_SITE_OTHER): Payer: Medicare Other | Admitting: Podiatry

## 2022-06-03 DIAGNOSIS — M79674 Pain in right toe(s): Secondary | ICD-10-CM | POA: Diagnosis not present

## 2022-06-03 DIAGNOSIS — M79675 Pain in left toe(s): Secondary | ICD-10-CM | POA: Diagnosis not present

## 2022-06-03 DIAGNOSIS — B351 Tinea unguium: Secondary | ICD-10-CM

## 2022-06-03 NOTE — Progress Notes (Unsigned)
  Subjective:  Patient ID: Jasmine Mccormick, female    DOB: 07/23/1934,  MRN: [id number]  Jasmine Mccormick presents to clinic today for:  Chief Complaint  Patient presents with   Nail Problem    Routine foot care PCP-Plotnikvo PCP VST- 3 or 4 weeks ago   New problem(s): None. {jgcomplaint:23593}  PCP is Plotnikov, Evie Lacks, MD , and last visit was  {Time; dates multiple:15870}.  No Known Allergies  Review of Systems: Negative except as noted in the HPI.  Objective: No changes noted in today's physical examination.  Jasmine Mccormick is a pleasant 88 y.o. female {jgbodyhabitus:24098} AAO x 3.  Neurovascular Examination: CFT <3 seconds b/l LE. Palpable DP/PT pulses b/l LE. Digital hair sparse b/l. Skin temperature gradient WNL b/l. No pain with calf compression b/l. No edema noted b/l. No cyanosis or clubbing noted b/l LE.  Protective sensation intact 5/5 intact bilaterally with 10g monofilament b/l.  Dermatological:  No open wounds b/l LE. No interdigital macerations noted b/l LE. Toenails 1-5 bilaterally elongated, discolored, dystrophic, thickened, and crumbly with subungual debris and tenderness to dorsal palpation.  Musculoskeletal:  Normal muscle strength 5/5 to all lower extremity muscle groups bilaterally. No pain, crepitus or joint limitation noted with ROM b/l LE. No gross bony pedal deformities b/l. Patient ambulates independently without assistive aids.  Assessment/Plan: 1. Pain due to onychomycosis of toenails of both feet     No orders of the defined types were placed in this encounter.   {Jgplan:23602::"-Patient/POA to call should there be question/concern in the interim."}   Return in about 3 months (around 09/03/2022).  Marzetta Board, DPM

## 2022-06-06 ENCOUNTER — Encounter (HOSPITAL_BASED_OUTPATIENT_CLINIC_OR_DEPARTMENT_OTHER): Payer: Self-pay | Admitting: Cardiology

## 2022-06-10 ENCOUNTER — Ambulatory Visit (INDEPENDENT_AMBULATORY_CARE_PROVIDER_SITE_OTHER): Payer: Medicare Other

## 2022-06-10 DIAGNOSIS — M810 Age-related osteoporosis without current pathological fracture: Secondary | ICD-10-CM | POA: Diagnosis not present

## 2022-06-10 MED ORDER — DENOSUMAB 60 MG/ML ~~LOC~~ SOSY
60.0000 mg | PREFILLED_SYRINGE | Freq: Once | SUBCUTANEOUS | Status: AC
  Administered 2022-06-10: 60 mg via SUBCUTANEOUS

## 2022-06-10 NOTE — Progress Notes (Signed)
Prolia given.  Pt tolerated well. Pt is aware to give the office a call for an side effects or reactions. Please co-sign.

## 2022-06-12 NOTE — Telephone Encounter (Signed)
Last Prolia inj 06/10/22 Next Prolia inj due 12/11/22

## 2022-06-15 ENCOUNTER — Ambulatory Visit (INDEPENDENT_AMBULATORY_CARE_PROVIDER_SITE_OTHER): Payer: Medicare Other

## 2022-06-15 VITALS — Ht 62.5 in

## 2022-06-15 DIAGNOSIS — Z0000 Encounter for general adult medical examination without abnormal findings: Secondary | ICD-10-CM | POA: Diagnosis not present

## 2022-06-15 NOTE — Progress Notes (Addendum)
Virtual Visit via Telephone Note  I connected with  Jasmine Mccormick on 06/15/22 at 11:30 AM EDT by telephone and verified that I am speaking with the correct person using two identifiers.  Location: Patient: Home Provider: [place] Persons participating in the virtual visit: [place]   I discussed the limitations, risks, security and privacy concerns of performing an evaluation and management service by telephone and the availability of in person appointments. The patient expressed understanding and agreed to proceed.  Interactive audio and video telecommunications were attempted between this nurse and patient, however failed, due to patient having technical difficulties OR patient did not have access to video capability.  We continued and completed visit with audio only.  Some vital signs may be absent or patient reported.   Sheral Flow, LPN  Subjective:   Jasmine Mccormick is a 88 y.o. female who presents for Medicare Annual (Subsequent) preventive examination.  Review of Systems     Cardiac Risk Factors include: advanced age (>32mn, >>28women);family history of premature cardiovascular disease;hypertension     Objective:    Today's Vitals   06/15/22 1147  Height: 5' 2.5" (1.588 m)  PainSc: 0-No pain   Body mass index is 25.58 kg/m.     06/15/2022   11:35 AM 11/13/2021    2:59 PM 10/26/2021    8:45 AM 06/15/2021    6:54 AM 03/28/2021   10:44 PM 03/10/2021    3:00 PM 11/30/2017   11:18 AM  Advanced Directives  Does Patient Have a Medical Advance Directive? Yes No Yes Yes No;Yes Yes No;Yes  Type of AParamedicof [place]okside Village[place] will  [place] will [place] will  Living will;Healthcare Power of [place] will  Does patient want to make changes to medical advance directive?      No - Patient declined   Copy of [place] Chart? No - copy requested No - copy available, Physician notified Yes - validated most recent copy scanned in chart (See row information)   No - copy requested No - copy requested  Would patient like information on creating a medical advance directive?     Yes (ED - Information included in AVS)      Current Medications (verified) Outpatient Encounter Medications as of 06/15/2022  Medication Sig   amiodarone (PACERONE) 200 MG tablet Take 1 tablet (200 mg total) by mouth daily.   apixaban (ELIQUIS) 5 MG TABS tablet TAKE 1 TABLET BY MOUTH 2 TIMES DAILY.   Calcium-Phosphorus-Vitamin D (CITRACAL +D3 PO)    ezetimibe (ZETIA) 10 MG tablet Take 1 tablet (10 mg total) by mouth daily.   levothyroxine (SYNTHROID) 75 MCG tablet TAKE 1 TABLET BY MOUTH EVERY DAY   metoprolol succinate (TOPROL-XL) 25 MG 24 hr tablet Take 1 tablet (25 mg total) by mouth daily. Take with or immediately following a meal.   Multiple Vitamins-Minerals (PRESERVISION AREDS 2 PO) Take by mouth 2 (two) times daily.   simvastatin (ZOCOR) 20 MG tablet Take 1 tablet (20 mg total) by mouth daily at 6 PM.   triamcinolone ointment (KENALOG) 0.1 % Apply 1 application topically 3 (three) times daily as needed.   [DISCONTINUED] omeprazole (PRILOSEC) 40 MG capsule Take 1 capsule (40 mg total) by mouth 2 (two) times daily before a meal. **PLEASE CALL OFFICE AND SCHEDULE FOLLOW UP   No facility-administered encounter medications on file as of 06/15/2022.  Allergies (verified) Patient has no known allergies.   History: Past Medical History:  Diagnosis Date   COVID 12/18/2020   mild   HTN (hypertension)    Normal at home   Hyperlipidemia    Hypothyroidism    Ileostomy in place [facility])    Memory loss    mild but not dx by doctor per [place]   Meniere disease 1996   Osteoporosis    UC (ulcerative colitis) ([place])    Past Surgical History:  Procedure Laterality Date   CARDIOVERSION N/A 06/15/2021   Procedure:  CARDIOVERSION;  Surgeon: Werner Lean, MD;  Location: [facility] [facility];  Service: Cardiovascular;  Laterality: N/A;   CARDIOVERSION N/A 10/26/2021   Procedure: CARDIOVERSION;  Surgeon: Buford Dresser, MD;  Location: [place];  Service: Cardiovascular;  Laterality: N/A;   COLECTOMY     COLONOSCOPY     Ear Shunt for Menier's     ENDARTERECTOMY MESENTERIC Right 03/29/2021   Procedure: MESENTERIC ARTERY THROMBECTOMY;  Surgeon: Serafina Mitchell, MD;  Location: [place];  Service: Vascular;  Laterality: Right;   ILEOSTOMY     INTRAOPERATIVE ARTERIOGRAM Right 03/29/2021   Procedure: INTRA OPERATIVE AORTOGRAM;  Surgeon: Serafina Mitchell, MD;  Location: [place];  Service: Vascular;  Laterality: Right;   KYPHOPLASTY N/A 01/07/2021   Procedure: Lumbar three Kyphoplasty;  Surgeon: Dawley, Theodoro Doing, DO;  Location: [place];  Service: Neurosurgery;  Laterality: N/A;   PROCTECTOMY     ULTRASOUND GUIDANCE FOR VASCULAR ACCESS Right 03/29/2021   Procedure: ULTRASOUND GUIDANCE FOR VASCULAR ACCESS;  Surgeon: Serafina Mitchell, MD;  Location[facility];  Service: Vascular;  Laterality: Right;   Family History  Problem Relation Age of Onset   Hypertension Other    Heart disease Other    Breast cancer Sister    Social History   Socioeconomic History   Marital status: Widowed    Spouse name: Not on file   Number of children: Not on file   Years of education: 12   Highest education level: Not on file  Occupational History   Occupation: Retired  Tobacco Use   Smoking status: Never   Smokeless tobacco: Never  Vaping Use   Vaping Use: Never used  Substance and Sexual Activity   Alcohol use: No   Drug use: No   Sexual activity: Not Currently    Birth control/protection: Post-menopausal  Other Topics Concern   Not on file  Social History Narrative   Not on file   Social Determinants of Health   Financial Resource Strain: [place]  (06/15/2022)   Overall Financial Resource Strain (CARDIA)     Difficulty of Paying Living Expenses: Not hard at all  Food Insecurity: No Food Insecurity (03/10/2021)   Hunger Vital Sign    Worried About Running Out of Food in the Last Year: Never true    [place] in the Last Year: Never true  Transportation Needs: No Transportation Needs (06/15/2022)   PRAPARE - Hydrologist (Medical): No    Lack of Transportation (Non-Medical): No  Physical Activity: Sufficiently Active (06/15/2022)   Exercise Vital Sign    Days of Exercise per Week: 5 days    Minutes of Exercise per Session: 30 min  Stress: No Stress Concern Present (06/15/2022)   [place]    Feeling of Stress : Not at all  Social Connections: Moderately Integrated (06/15/2022)   Social Connection and Isolation  Panel [NHANES]    Frequency of Communication with Friends and Family: More than three times a week    Frequency of Social Gatherings with Friends and Family: More than three times a week    Attends Religious Services: More than 4 times per year    Active Member of [employer] or Organizations: Yes    Attends Archivist Meetings: More than 4 times per year    Marital Status: Widowed    Tobacco Counseling Counseling given: Not Answered   Clinical Intake:  Pre-visit preparation completed: Yes  Pain : No/denies pain Pain Score: 0-No pain     BMI - recorded: 25.58 (05/11/2022) Nutritional Risks: None Diabetes: No  How often do you need to have someone help you when you read instructions, pamphlets, or other written materials from your doctor or pharmacy?: 1 - Never What is the last grade level you completed in school?: [employer]; [employer]  Diabetic? no  Interpreter Needed?: No  Information entered by :: Lisette Abu, LPN.   Activities of Daily Living    06/15/2022   11:51 AM  In your present state of health, do you have any difficulty performing the following  activities:  Hearing? 0  Vision? 0  Difficulty concentrating or making decisions? 0  Walking or climbing stairs? 0  Dressing or bathing? 0  Doing errands, shopping? 0  Preparing Food and eating ? N  Using the Toilet? N  In the past six months, have you accidently leaked urine? N  Do you have problems with loss of bowel control? N  Managing your Medications? N  Managing your Finances? N  Housekeeping or managing your Housekeeping? N    Patient Care Team: Plotnikov, Evie Lacks, MD as PCP - General Buford Dresser, MD as PCP - Cardiology (Cardiology) Monna Fam, MD as Consulting Physician (Ophthalmology)  Indicate any recent Medical Services you may have received from other than [facility] providers in the past year (date may be approximate).     Assessment:   This is a routine wellness examination for Jasmine Mccormick.  Hearing/Vision screen Hearing Screening - Comments:: Patient has decreased hearing.  No hearing aids. Vision Screening - Comments:: Wears rx glasses - up to date with routine eye exams with [facility]   Dietary issues and exercise activities discussed: Current Exercise Habits: Home exercise routine, Type of exercise: walking, Time (Minutes): 30, Frequency (Times/Week): 5, Weekly Exercise (Minutes/Week): 150, Intensity: Mild, Exercise limited by: None identified   Goals Addressed   None   Depression Screen    06/15/2022   11:40 AM 04/11/2022   11:21 AM 03/10/2021    3:06 PM 09/17/2020   10:48 AM 02/20/2020    1:09 PM 11/30/2017   11:19 AM 10/18/2017   10:25 AM  PHQ 2/9 Scores  PHQ - 2 Score 0 0 0 0 0 0 0  PHQ- 9 Score  3  0  2     Fall Risk    06/15/2022   11:36 AM 04/11/2022   11:20 AM 03/10/2021    3:01 PM 09/17/2020   10:47 AM 07/05/2019    9:16 AM  Fall Risk   Falls in the past year? 0 0 0 1 1  Comment     Emmi Telephone Survey: data to providers prior to load  Number falls in past yr: 0 1 0 0 1  Comment     Emmi Telephone Survey Actual  Response = 99  Injury with Fall? 0 1 0 0 0  Risk  for fall due to : No Fall Risks History of fall(s);Impaired balance/gait No Fall Risks Impaired balance/gait   Follow up Falls prevention discussed Falls evaluation completed Falls evaluation completed      FALL RISK PREVENTION PERTAINING TO THE HOME:  Any stairs in or around the home? No  If so, are there any without handrails? No  Home free of loose throw rugs in walkways, pet beds, electrical cords, etc? Yes  Adequate lighting in your home to reduce risk of falls? Yes   ASSISTIVE DEVICES UTILIZED TO PREVENT FALLS:  Life alert? No  Use of a cane, walker or w/c? No  Grab bars in the bathroom? No  Shower chair or bench in shower? No  Elevated toilet seat or a handicapped toilet? No   TIMED UP AND GO:  Was the test performed? No . Phone Visit  Cognitive Function:    11/30/2017   11:43 AM  MMSE - Mini Mental State Exam  Orientation to time 5  Orientation to Place 5  Registration 3  Attention/ Calculation 5  Recall 2  Language- name 2 objects 2  Language- repeat 1  Language- follow 3 step command 3  Language- read & follow direction 1  Write a sentence 1  Copy design 1  Total score 29        06/15/2022   11:52 AM  6CIT Screen  What Year? 0 points  What month? 0 points  What time? 0 points  Count back from 20 0 points  Months in reverse 0 points  Repeat phrase 0 points  Total Score 0 points    Immunizations Immunization History  Administered Date(s) Administered   Fluad Quad(high Dose 65+) 05/12/2021, 04/26/2022   Influenza Split 05/30/2011, 05/15/2012   Influenza Whole 05/15/2006, 05/04/2010   Influenza, High Dose Seasonal PF 05/16/2015, 05/31/2016, 05/20/2017, 05/08/2018, 05/09/2019, 05/12/2021, 04/26/2022   Influenza-Unspecified 05/15/2013, 05/15/2014   PFIZER(Purple Top)SARS-COV-2 Vaccination 09/03/2019, 09/23/2019, 06/10/2020   Pneumococcal Conjugate-13 08/01/2013   Pneumococcal Polysaccharide-23  07/04/2012, 09/17/2020   Td 07/04/2012   Zoster Recombinat (Shingrix) 09/29/2016    TDAP status: Up to date  Flu Vaccine status: Up to date  Pneumococcal vaccine status: Up to date  Covid-19 vaccine status: Completed vaccines  Qualifies for Shingles Vaccine? Yes   Zostavax completed No   Shingrix Completed?: Yes  Screening Tests Health Maintenance  Topic Date Due   Zoster Vaccines- Shingrix (2 of 2) 11/24/2016   COVID-19 Vaccine (4 - Pfizer risk series) 08/05/2020   TETANUS/TDAP  07/04/2022   Medicare Annual Wellness (AWV)  06/16/2023   Pneumonia Vaccine 35+ Years old  Completed   INFLUENZA VACCINE  Completed   DEXA SCAN  Completed   HPV VACCINES  Aged Out    Health Maintenance  Health Maintenance Due  Topic Date Due   Zoster Vaccines- Shingrix (2 of 2) 11/24/2016   COVID-19 Vaccine (4 - Pfizer risk series) 08/05/2020    Colorectal cancer screening: No longer required.   Mammogram status: No longer required due to age.  Bone Density status: Completed 10/18/2017. Results reflect: Bone density results: OSTEOPOROSIS. Repeat every 2 years.  Lung Cancer Screening: (Low Dose CT Chest recommended if Age 75-80 years, 30 pack-year currently smoking OR have quit w/in 15years.) does not qualify.   Lung Cancer Screening Referral: no  Additional Screening:  Hepatitis C Screening: does not qualify; Completed no  Vision Screening: Recommended annual ophthalmology exams for early detection of glaucoma and other disorders of the eye. Is the patient up  to date with their annual eye exam?  Yes  Who is the provider or what is the name of the office in which the patient attends annual eye exams? [facility] If pt is not established with a provider, would they like to be referred to a provider to establish care? No .   Dental Screening: Recommended annual dental exams for proper oral hygiene  Community Resource Referral / Chronic Care Management: CRR required this visit?   No   CCM required this visit?  No      Plan:     I have personally reviewed and noted the following in the patient's chart:   Medical and social history Use of alcohol, tobacco or illicit drugs  Current medications and supplements including opioid prescriptions. Patient is not currently taking opioid prescriptions. Functional ability and status Nutritional status Physical activity Advanced directives List of other physicians Hospitalizations, surgeries, and ER visits in previous 12 months Vitals Screenings to include cognitive, depression, and falls Referrals and appointments  In addition, I have reviewed and discussed with patient certain preventive protocols, quality metrics, and best practice recommendations. A written personalized care plan for preventive services as well as general preventive health recommendations were provided to patient.     Sheral Flow, LPN   14/04/7025   Nurse Notes: N/A  Medical screening examination/treatment/procedure(s) were performed by non-physician practitioner and as supervising physician I was immediately available for consultation/collaboration.  I agree with above. Lew Dawes, MD

## 2022-06-15 NOTE — Patient Instructions (Signed)
Jasmine Mccormick , Thank you for taking time to come for your Medicare Wellness Visit. I appreciate your ongoing commitment to your health goals. Please review the following plan we discussed and let me know if I can assist you in the future.   These are the goals we discussed:  Goals      Patient Stated     Stay as healthy and as independent as possible. Continue to enjoy life and stay socially active.        This is a list of the screening recommended for you and due dates:  Health Maintenance  Topic Date Due   Zoster (Shingles) Vaccine (2 of 2) 11/24/2016   COVID-19 Vaccine (4 - Pfizer risk series) 08/05/2020   Tetanus Vaccine  07/04/2022   Medicare Annual Wellness Visit  06/16/2023   Pneumonia Vaccine  Completed   Flu Shot  Completed   DEXA scan (bone density measurement)  Completed   HPV Vaccine  Aged Out    Advanced directives: Yes  Conditions/risks identified: Yes  Next appointment: Follow up in one year for your annual wellness visit.   Preventive Care 52 Years and Older, Female Preventive care refers to lifestyle choices and visits with your health care provider that can promote health and wellness. What does preventive care include? A yearly physical exam. This is also called an annual well check. Dental exams once or twice a year. Routine eye exams. Ask your health care provider how often you should have your eyes checked. Personal lifestyle choices, including: Daily care of your teeth and gums. Regular physical activity. Eating a healthy diet. Avoiding tobacco and drug use. Limiting alcohol use. Practicing safe sex. Taking low-dose aspirin every day. Taking vitamin and mineral supplements as recommended by your health care provider. What happens during an annual well check? The services and screenings done by your health care provider during your annual well check will depend on your age, overall health, lifestyle risk factors, and family history of  disease. Counseling  Your health care provider may ask you questions about your: Alcohol use. Tobacco use. Drug use. Emotional well-being. Home and relationship well-being. Sexual activity. Eating habits. History of falls. Memory and ability to understand (cognition). Work and work Statistician. Reproductive health. Screening  You may have the following tests or measurements: Height, weight, and BMI. Blood pressure. Lipid and cholesterol levels. These may be checked every 5 years, or more frequently if you are over 53 years old. Skin check. Lung cancer screening. You may have this screening every year starting at age 86 if you have a 30-pack-year history of smoking and currently smoke or have quit within the past 15 years. Fecal occult blood test (FOBT) of the stool. You may have this test every year starting at age 86. Flexible sigmoidoscopy or colonoscopy. You may have a sigmoidoscopy every 5 years or a colonoscopy every 10 years starting at age 86. Hepatitis C blood test. Hepatitis B blood test. Sexually transmitted disease (STD) testing. Diabetes screening. This is done by checking your blood sugar (glucose) after you have not eaten for a while (fasting). You may have this done every 1-3 years. Bone density scan. This is done to screen for osteoporosis. You may have this done starting at age 86. Mammogram. This may be done every 1-2 years. Talk to your health care provider about how often you should have regular mammograms. Talk with your health care provider about your test results, treatment options, and if necessary, the need for more  tests. Vaccines  Your health care provider may recommend certain vaccines, such as: Influenza vaccine. This is recommended every year. Tetanus, diphtheria, and acellular pertussis (Tdap, Td) vaccine. You may need a Td booster every 10 years. Zoster vaccine. You may need this after age 52. Pneumococcal 13-valent conjugate (PCV13) vaccine. One  dose is recommended after age 86. Pneumococcal polysaccharide (PPSV23) vaccine. One dose is recommended after age 86. Talk to your health care provider about which screenings and vaccines you need and how often you need them. This information is not intended to replace advice given to you by your health care provider. Make sure you discuss any questions you have with your health care provider. Document Released: 08/28/2015 Document Revised: 04/20/2016 Document Reviewed: 06/02/2015 Elsevier Interactive Patient Education  2017 [place] Prevention in the Home Falls can cause injuries. They can happen to people of all ages. There are many things you can do to make your home safe and to help prevent falls. What can I do on the outside of my home? Regularly fix the edges of walkways and driveways and fix any cracks. Remove anything that might make you trip as you walk through a door, such as a raised step or threshold. Trim any bushes or trees on the path to your home. Use bright outdoor lighting. Clear any walking paths of anything that might make someone trip, such as rocks or tools. Regularly check to see if handrails are loose or broken. Make sure that both sides of any steps have handrails. Any raised decks and porches should have guardrails on the edges. Have any leaves, snow, or ice cleared regularly. Use sand or salt on walking paths during winter. Clean up any spills in your garage right away. This includes oil or grease spills. What can I do in the bathroom? Use night lights. Install grab bars by the toilet and in the tub and shower. Do not use towel bars as grab bars. Use non-skid mats or decals in the tub or shower. If you need to sit down in the shower, use a plastic, non-slip stool. Keep the floor dry. Clean up any water that spills on the floor as soon as it happens. Remove soap buildup in the tub or shower regularly. Attach bath mats securely with double-sided  non-slip rug tape. Do not have throw rugs and other things on the floor that can make you trip. What can I do in the bedroom? Use night lights. Make sure that you have a light by your bed that is easy to reach. Do not use any sheets or blankets that are too big for your bed. They should not hang down onto the floor. Have a firm chair that has side arms. You can use this for support while you get dressed. Do not have throw rugs and other things on the floor that can make you trip. What can I do in the kitchen? Clean up any spills right away. Avoid walking on wet floors. Keep items that you use a lot in easy-to-reach places. If you need to reach something above you, use a strong step stool that has a grab bar. Keep electrical cords out of the way. Do not use floor polish or wax that makes floors slippery. If you must use wax, use non-skid floor wax. Do not have throw rugs and other things on the floor that can make you trip. What can I do with my stairs? Do not leave any items on the stairs. Make sure  that there are handrails on both sides of the stairs and use them. Fix handrails that are broken or loose. Make sure that handrails are as long as the stairways. Check any carpeting to make sure that it is firmly attached to the stairs. Fix any carpet that is loose or worn. Avoid having throw rugs at the top or bottom of the stairs. If you do have throw rugs, attach them to the floor with carpet tape. Make sure that you have a light switch at the top of the stairs and the bottom of the stairs. If you do not have them, ask someone to add them for you. What else can I do to help prevent falls? Wear shoes that: Do not have high heels. Have rubber bottoms. Are comfortable and fit you well. Are closed at the toe. Do not wear sandals. If you use a stepladder: Make sure that it is fully opened. Do not climb a closed stepladder. Make sure that both sides of the stepladder are locked into place. Ask  someone to hold it for you, if possible. Clearly mark and make sure that you can see: Any grab bars or handrails. First and last steps. Where the edge of each step is. Use tools that help you move around (mobility aids) if they are needed. These include: Canes. Walkers. Scooters. Crutches. Turn on the lights when you go into a dark area. Replace any light bulbs as soon as they burn out. Set up your furniture so you have a clear path. Avoid moving your furniture around. If any of your floors are uneven, fix them. If there are any pets around you, be aware of where they are. Review your medicines with your doctor. Some medicines can make you feel dizzy. This can increase your chance of falling. Ask your doctor what other things that you can do to help prevent falls. This information is not intended to replace advice given to you by your health care provider. Make sure you discuss any questions you have with your health care provider. Document Released: 05/28/2009 Document Revised: 01/07/2016 Document Reviewed: 09/05/2014 Elsevier Interactive Patient Education  2017 [employer].

## 2022-06-20 DIAGNOSIS — H40013 Open angle with borderline findings, low risk, bilateral: Secondary | ICD-10-CM | POA: Diagnosis not present

## 2022-06-20 LAB — HM DIABETES EYE EXAM

## 2022-06-21 ENCOUNTER — Encounter: Payer: Self-pay | Admitting: Internal Medicine

## 2022-07-21 ENCOUNTER — Other Ambulatory Visit (HOSPITAL_BASED_OUTPATIENT_CLINIC_OR_DEPARTMENT_OTHER): Payer: Self-pay | Admitting: Cardiology

## 2022-07-21 DIAGNOSIS — I480 Paroxysmal atrial fibrillation: Secondary | ICD-10-CM

## 2022-07-21 NOTE — Telephone Encounter (Signed)
Rx request sent to pharmacy.  

## 2022-08-10 ENCOUNTER — Other Ambulatory Visit (HOSPITAL_BASED_OUTPATIENT_CLINIC_OR_DEPARTMENT_OTHER): Payer: Self-pay | Admitting: Cardiology

## 2022-08-10 DIAGNOSIS — I4819 Other persistent atrial fibrillation: Secondary | ICD-10-CM

## 2022-08-10 NOTE — Telephone Encounter (Signed)
Eliquis

## 2022-08-10 NOTE — Telephone Encounter (Signed)
Prescription refill request for Eliquis received. Indication: Afib  Last office visit: 05/11/22([facility])  Scr: 1.14 (04/11/22)  Age: 88 Weight: 64.5kg  Appropriate dose and refill sent to requested pharmacy.

## 2022-08-28 ENCOUNTER — Other Ambulatory Visit: Payer: Self-pay | Admitting: Vascular Surgery

## 2022-08-31 NOTE — Telephone Encounter (Signed)
Refills need to go to PCP to f/u with labs

## 2022-09-08 ENCOUNTER — Telehealth: Payer: Self-pay

## 2022-09-08 NOTE — Telephone Encounter (Signed)
Spoke with pt's niece and let her know about blood work that is due. Pt's niece verbalized understanding.

## 2022-09-08 NOTE — Telephone Encounter (Signed)
-----  Message from Marice Potter, RN sent at 06/02/2022 11:26 AM EDT ----- Pt needs repeat CBC. Order in epic.

## 2022-09-12 ENCOUNTER — Other Ambulatory Visit (INDEPENDENT_AMBULATORY_CARE_PROVIDER_SITE_OTHER): Payer: Medicare Other

## 2022-09-12 DIAGNOSIS — D509 Iron deficiency anemia, unspecified: Secondary | ICD-10-CM

## 2022-09-12 LAB — CBC WITH DIFFERENTIAL/PLATELET
Basophils Absolute: 0 10*3/uL (ref 0.0–0.1)
Basophils Relative: 0.4 % (ref 0.0–3.0)
Eosinophils Absolute: 0.1 10*3/uL (ref 0.0–0.7)
Eosinophils Relative: 1.4 % (ref 0.0–5.0)
HCT: 40.8 % (ref 36.0–46.0)
Hemoglobin: 13.7 g/dL (ref 12.0–15.0)
Lymphocytes Relative: 12.2 % (ref 12.0–46.0)
Lymphs Abs: 0.8 10*3/uL (ref 0.7–4.0)
MCHC: 33.7 g/dL (ref 30.0–36.0)
MCV: 96.7 fl (ref 78.0–100.0)
Monocytes Absolute: 0.6 10*3/uL (ref 0.1–1.0)
Monocytes Relative: 10.1 % (ref 3.0–12.0)
Neutro Abs: 4.8 10*3/uL (ref 1.4–7.7)
Neutrophils Relative %: 75.9 % (ref 43.0–77.0)
Platelets: 201 10*3/uL (ref 150.0–400.0)
RBC: 4.22 Mil/uL (ref 3.87–5.11)
RDW: 13.6 % (ref 11.5–15.5)
WBC: 6.3 10*3/uL (ref 4.0–10.5)

## 2022-09-15 ENCOUNTER — Other Ambulatory Visit: Payer: Self-pay

## 2022-09-15 MED ORDER — OMEPRAZOLE 20 MG PO CPDR: 20.0000 mg | DELAYED_RELEASE_CAPSULE | Freq: Every day | ORAL | 3 refills | Status: DC

## 2022-09-19 ENCOUNTER — Other Ambulatory Visit: Payer: Self-pay | Admitting: Nurse Practitioner

## 2022-09-28 ENCOUNTER — Other Ambulatory Visit: Payer: Self-pay | Admitting: Vascular Surgery

## 2022-10-03 ENCOUNTER — Encounter: Payer: Self-pay | Admitting: Podiatry

## 2022-10-03 ENCOUNTER — Ambulatory Visit (INDEPENDENT_AMBULATORY_CARE_PROVIDER_SITE_OTHER): Payer: Medicare Other | Admitting: Podiatry

## 2022-10-03 VITALS — BP 172/72

## 2022-10-03 DIAGNOSIS — M79675 Pain in left toe(s): Secondary | ICD-10-CM

## 2022-10-03 DIAGNOSIS — M79674 Pain in right toe(s): Secondary | ICD-10-CM

## 2022-10-03 DIAGNOSIS — B351 Tinea unguium: Secondary | ICD-10-CM | POA: Diagnosis not present

## 2022-10-03 DIAGNOSIS — M2041 Other hammer toe(s) (acquired), right foot: Secondary | ICD-10-CM | POA: Diagnosis not present

## 2022-10-03 DIAGNOSIS — M2042 Other hammer toe(s) (acquired), left foot: Secondary | ICD-10-CM | POA: Diagnosis not present

## 2022-10-03 NOTE — Addendum Note (Signed)
Addended by: Marzetta Board on: 10/03/2022 10:26 PM   Modules accepted: Level of Service

## 2022-10-03 NOTE — Progress Notes (Addendum)
  Subjective:  Patient ID: Jasmine Mccormick, female    DOB: [birth year],  MRN: [id number]  Jasmine Mccormick presents to clinic today for painful thick toenails that are difficult to trim. Pain interferes with ambulation. Aggravating factors include wearing enclosed shoe gear. Pain is relieved with periodic professional debridement.  Chief Complaint  Patient presents with   Nail Problem    [facility] PCP-Jasmine Mccormick PCP VST-2023   New problem(s): None.   Her niece is present during today's visit.  PCP is Jasmine Mccormick, Jasmine Lacks, Jasmine Mccormick.  No Known Allergies  Review of Systems: Negative except as noted in the HPI.  Objective: No changes noted in today's physical examination. Vitals:   10/03/22 1135 10/03/22 1152  BP: (!) 167/76 (!) 172/72   Jasmine Mccormick is a pleasant 89 y.o. female WD, WN in NAD. AAO x 3.  Neurovascular Examination: CFT <3 seconds b/l LE. Palpable DP/PT pulses b/l LE. Digital hair sparse b/l. Skin temperature gradient WNL b/l. No pain with calf compression b/l. No edema noted b/l. No cyanosis or clubbing noted b/l LE.  Protective sensation intact 5/5 intact bilaterally with 10g monofilament b/l.  Dermatological:  No open wounds b/l LE. No interdigital macerations noted b/l LE. Toenails 1-5 bilaterally elongated, discolored, dystrophic, thickened, and crumbly with subungual debris and tenderness to dorsal palpation.  Musculoskeletal:  Normal muscle strength 5/5 to all lower extremity muscle groups bilaterally. No pain, crepitus or joint limitation noted with ROM b/l LE. Adductovarus deformity right 5th digit with tenderness tp palpation. No erythema, no edema, no drainage, no fluctuance. Patient ambulates independently without assistive aids.  Assessment/Plan: 1. Pain due to onychomycosis of toenails of both feet     -Patient's family member present. All questions/concerns addressed on today's visit. -Continue supportive shoe gear daily. -Mycotic toenails 1-5  bilaterally were debrided in length and girth with sterile nail nippers and dremel without incident. -As a courtesy, corn(s) R 5th toe pared utilizing sterile scalpel blade without complication or incident. Total number pared=1. -Dispensed tube foam. Apply to R 5th toe every morning. Remove every evening. -Patient/POA to call should there be question/concern in the interim.   Return in about 3 months (around 01/01/2023).  Marzetta Board, DPM

## 2022-10-04 ENCOUNTER — Other Ambulatory Visit: Payer: Self-pay | Admitting: Internal Medicine

## 2022-10-04 ENCOUNTER — Encounter: Payer: Self-pay | Admitting: Internal Medicine

## 2022-10-04 NOTE — Telephone Encounter (Signed)
Refill already sent to [name]..Jasmine Mccormick

## 2022-10-05 ENCOUNTER — Other Ambulatory Visit: Payer: Self-pay | Admitting: Internal Medicine

## 2022-10-11 NOTE — Telephone Encounter (Signed)
Forwarding to Rx Prior Auth Team 

## 2022-10-24 ENCOUNTER — Ambulatory Visit (HOSPITAL_BASED_OUTPATIENT_CLINIC_OR_DEPARTMENT_OTHER): Payer: Medicare Other | Admitting: Cardiology

## 2022-11-14 ENCOUNTER — Other Ambulatory Visit (HOSPITAL_COMMUNITY): Payer: Self-pay

## 2022-11-14 NOTE — Telephone Encounter (Signed)
Tried calling pt no answer LMOM OK for prolia on after 12/11/22.Marland KitchenJohny Chess

## 2022-11-17 ENCOUNTER — Ambulatory Visit (INDEPENDENT_AMBULATORY_CARE_PROVIDER_SITE_OTHER): Payer: Medicare Other | Admitting: Podiatry

## 2022-11-17 DIAGNOSIS — M205X1 Other deformities of toe(s) (acquired), right foot: Secondary | ICD-10-CM

## 2022-11-17 DIAGNOSIS — L84 Corns and callosities: Secondary | ICD-10-CM

## 2022-11-17 NOTE — Progress Notes (Signed)
  Subjective:  Patient ID: Jasmine Mccormick, female    DOB: 13-Mar-1934,  MRN: [id number]  Chief Complaint  Patient presents with   Callouses    Right 5th toe pain.     89 y.o. female presents with concern for pain in the right fifth toe.  She has previously had a callus at the area.  She does have some pain in the toe itself as well.  Dr. Donzetta Matters normally trims down the callus.  Which provides relief however this was done recently and patient is having increased pain.  Past Medical History:  Diagnosis Date   COVID 12/18/2020   mild   HTN (hypertension)    Normal at home   Hyperlipidemia    Hypothyroidism    Ileostomy in place [facility])    Memory loss    mild but not dx by doctor per Neice   Meniere disease 1996   Osteoporosis    UC (ulcerative colitis) (HCC)     No Known Allergies  ROS: Negative except as per HPI above  Objective:  General: AAO x3, NAD  Dermatological: Hyperkeratotic lesion present at the plantar lateral aspect of the distal aspect of the fifth toe on the right foot.  Painful with palpation of this area and just plantar to it.  Vascular:  Dorsalis Pedis artery and Posterior Tibial artery pedal pulses are 2/4 bilateral.  Capillary fill time < 3 sec to all digits.   Neruologic: Grossly intact via light touch bilateral. Protective threshold intact to all sites bilateral.   Musculoskeletal: Adductovarus rotation of the fifth toe on the right foot.  Pain at the proximal interphalangeal joint with palpation  Gait: Unassisted, Nonantalgic.   No images are attached to the encounter.  Radiographs:  Deferred Assessment:   1. Callus of foot   2. Acquired adductovarus rotation of toe of right foot      Plan:  Patient was evaluated and treated and all questions answered.  # Adductovarus rotation of fifth toe right foot as well as hyperkeratotic lesion of the distal aspect of the toe related to this rotation and pressure -Discussed with the patient that  she does have a callus present on the toe which I believe is causing most of her pain.  This was debrided and patient says she felt less pain after -In regards to the fifth adductovarus rotation which is causing the calluses develop, there are options including conservative or surgical. -Conservatively could proceed with a gel toe cap which was dispensed to the patient.  This would prevent pressure on the distal tuft of the fifth toe despite his rotation and hopefully prevent worsening pain when she is walking -Surgically fifth hammertoe repair could be considered with derotational arthroplasty of the proximal interphalangeal joint.  Briefly discussed the risk benefits alternatives of this procedure. -Patient and her husband like to proceed with conservative care at this time do not want surgery.  Will continue with routine debridement.  They will follow-up with Dr. Donzetta Matters in the future        Corinna Gab, DPM [facility] / [facility]

## 2022-11-24 ENCOUNTER — Inpatient Hospital Stay (HOSPITAL_COMMUNITY)
Admission: EM | Admit: 2022-11-24 | Discharge: 2022-11-28 | DRG: 682 | Disposition: A | Discharge status: Skilled Nursing Facility | Payer: Medicare Other | Attending: Internal Medicine | Admitting: Internal Medicine

## 2022-11-24 ENCOUNTER — Other Ambulatory Visit: Payer: Self-pay

## 2022-11-24 ENCOUNTER — Telehealth: Payer: Self-pay | Admitting: Cardiology

## 2022-11-24 ENCOUNTER — Telehealth (HOSPITAL_BASED_OUTPATIENT_CLINIC_OR_DEPARTMENT_OTHER): Payer: Self-pay | Admitting: Cardiology

## 2022-11-24 ENCOUNTER — Encounter: Payer: Self-pay | Admitting: Internal Medicine

## 2022-11-24 ENCOUNTER — Encounter (HOSPITAL_COMMUNITY): Payer: Self-pay

## 2022-11-24 ENCOUNTER — Emergency Department (HOSPITAL_COMMUNITY): Payer: Medicare Other

## 2022-11-24 DIAGNOSIS — K219 Gastro-esophageal reflux disease without esophagitis: Secondary | ICD-10-CM | POA: Diagnosis not present

## 2022-11-24 DIAGNOSIS — Z0389 Encounter for observation for other suspected diseases and conditions ruled out: Secondary | ICD-10-CM | POA: Diagnosis not present

## 2022-11-24 DIAGNOSIS — N179 Acute kidney failure, unspecified: Principal | ICD-10-CM

## 2022-11-24 DIAGNOSIS — R413 Other amnesia: Secondary | ICD-10-CM | POA: Diagnosis not present

## 2022-11-24 DIAGNOSIS — R8271 Bacteriuria: Secondary | ICD-10-CM | POA: Diagnosis present

## 2022-11-24 DIAGNOSIS — S0990XA Unspecified injury of head, initial encounter: Secondary | ICD-10-CM | POA: Diagnosis not present

## 2022-11-24 DIAGNOSIS — Z8616 Personal history of COVID-19: Secondary | ICD-10-CM | POA: Diagnosis not present

## 2022-11-24 DIAGNOSIS — K5190 Ulcerative colitis, unspecified, without complications: Secondary | ICD-10-CM | POA: Diagnosis present

## 2022-11-24 DIAGNOSIS — Z79890 Hormone replacement therapy (postmenopausal): Secondary | ICD-10-CM | POA: Diagnosis not present

## 2022-11-24 DIAGNOSIS — I10 Essential (primary) hypertension: Secondary | ICD-10-CM | POA: Diagnosis not present

## 2022-11-24 DIAGNOSIS — E876 Hypokalemia: Secondary | ICD-10-CM | POA: Diagnosis present

## 2022-11-24 DIAGNOSIS — Z8249 Family history of ischemic heart disease and other diseases of the circulatory system: Secondary | ICD-10-CM

## 2022-11-24 DIAGNOSIS — H8109 Meniere's disease, unspecified ear: Secondary | ICD-10-CM | POA: Diagnosis present

## 2022-11-24 DIAGNOSIS — K51818 Other ulcerative colitis with other complication: Secondary | ICD-10-CM | POA: Diagnosis not present

## 2022-11-24 DIAGNOSIS — W19XXXA Unspecified fall, initial encounter: Secondary | ICD-10-CM | POA: Diagnosis not present

## 2022-11-24 DIAGNOSIS — I4819 Other persistent atrial fibrillation: Secondary | ICD-10-CM | POA: Diagnosis present

## 2022-11-24 DIAGNOSIS — W010XXA Fall on same level from slipping, tripping and stumbling without subsequent striking against object, initial encounter: Secondary | ICD-10-CM | POA: Diagnosis present

## 2022-11-24 DIAGNOSIS — R55 Syncope and collapse: Secondary | ICD-10-CM | POA: Diagnosis present

## 2022-11-24 DIAGNOSIS — M810 Age-related osteoporosis without current pathological fracture: Secondary | ICD-10-CM | POA: Diagnosis present

## 2022-11-24 DIAGNOSIS — R109 Unspecified abdominal pain: Secondary | ICD-10-CM | POA: Diagnosis not present

## 2022-11-24 DIAGNOSIS — E872 Acidosis, unspecified: Secondary | ICD-10-CM | POA: Diagnosis not present

## 2022-11-24 DIAGNOSIS — Z9049 Acquired absence of other specified parts of digestive tract: Secondary | ICD-10-CM

## 2022-11-24 DIAGNOSIS — N390 Urinary tract infection, site not specified: Secondary | ICD-10-CM | POA: Diagnosis present

## 2022-11-24 DIAGNOSIS — S199XXA Unspecified injury of neck, initial encounter: Secondary | ICD-10-CM | POA: Diagnosis not present

## 2022-11-24 DIAGNOSIS — Z7901 Long term (current) use of anticoagulants: Secondary | ICD-10-CM

## 2022-11-24 DIAGNOSIS — R58 Hemorrhage, not elsewhere classified: Secondary | ICD-10-CM | POA: Diagnosis not present

## 2022-11-24 DIAGNOSIS — E871 Hypo-osmolality and hyponatremia: Secondary | ICD-10-CM | POA: Diagnosis present

## 2022-11-24 DIAGNOSIS — E860 Dehydration: Secondary | ICD-10-CM | POA: Diagnosis present

## 2022-11-24 DIAGNOSIS — Z79899 Other long term (current) drug therapy: Secondary | ICD-10-CM

## 2022-11-24 DIAGNOSIS — Z9181 History of falling: Secondary | ICD-10-CM | POA: Diagnosis not present

## 2022-11-24 DIAGNOSIS — E039 Hypothyroidism, unspecified: Secondary | ICD-10-CM | POA: Diagnosis present

## 2022-11-24 DIAGNOSIS — K55069 Acute infarction of intestine, part and extent unspecified: Secondary | ICD-10-CM | POA: Diagnosis present

## 2022-11-24 DIAGNOSIS — Y92009 Unspecified place in unspecified non-institutional (private) residence as the place of occurrence of the external cause: Secondary | ICD-10-CM | POA: Diagnosis not present

## 2022-11-24 DIAGNOSIS — I480 Paroxysmal atrial fibrillation: Secondary | ICD-10-CM | POA: Diagnosis not present

## 2022-11-24 DIAGNOSIS — N281 Cyst of kidney, acquired: Secondary | ICD-10-CM | POA: Diagnosis not present

## 2022-11-24 DIAGNOSIS — E785 Hyperlipidemia, unspecified: Secondary | ICD-10-CM | POA: Diagnosis present

## 2022-11-24 DIAGNOSIS — E034 Atrophy of thyroid (acquired): Secondary | ICD-10-CM | POA: Diagnosis not present

## 2022-11-24 DIAGNOSIS — S41111A Laceration without foreign body of right upper arm, initial encounter: Secondary | ICD-10-CM | POA: Diagnosis present

## 2022-11-24 DIAGNOSIS — Z932 Ileostomy status: Secondary | ICD-10-CM

## 2022-11-24 DIAGNOSIS — Z7401 Bed confinement status: Secondary | ICD-10-CM | POA: Diagnosis not present

## 2022-11-24 DIAGNOSIS — E861 Hypovolemia: Secondary | ICD-10-CM | POA: Diagnosis present

## 2022-11-24 DIAGNOSIS — I499 Cardiac arrhythmia, unspecified: Secondary | ICD-10-CM | POA: Diagnosis not present

## 2022-11-24 DIAGNOSIS — N3000 Acute cystitis without hematuria: Secondary | ICD-10-CM | POA: Diagnosis not present

## 2022-11-24 HISTORY — DX: Other persistent atrial fibrillation: I48.19

## 2022-11-24 LAB — BASIC METABOLIC PANEL
Anion gap: 18 — ABNORMAL HIGH (ref 5–15)
BUN: 81 mg/dL — ABNORMAL HIGH (ref 8–23)
CO2: 19 mmol/L — ABNORMAL LOW (ref 22–32)
Calcium: 8.9 mg/dL (ref 8.9–10.3)
Chloride: 91 mmol/L — ABNORMAL LOW (ref 98–111)
Creatinine, Ser: 3.47 mg/dL — ABNORMAL HIGH (ref 0.44–1.00)
GFR, Estimated: 12 mL/min — ABNORMAL LOW (ref >60)
Glucose, Bld: 115 mg/dL — ABNORMAL HIGH (ref 70–99)
Potassium: 4.1 mmol/L (ref 3.5–5.1)
Sodium: 128 mmol/L — ABNORMAL LOW (ref 135–145)

## 2022-11-24 LAB — CBC WITH DIFFERENTIAL/PLATELET
Abs Immature Granulocytes: 0.05 10*3/uL (ref 0.00–0.07)
Basophils Absolute: 0 10*3/uL (ref 0.0–0.1)
Basophils Relative: 0 %
Eosinophils Absolute: 0 10*3/uL (ref 0.0–0.5)
Eosinophils Relative: 0 %
HCT: 47.7 % — ABNORMAL HIGH (ref 36.0–46.0)
Hemoglobin: 16.1 g/dL — ABNORMAL HIGH (ref 12.0–15.0)
Immature Granulocytes: 1 %
Lymphocytes Relative: 6 %
Lymphs Abs: 0.5 10*3/uL — ABNORMAL LOW (ref 0.7–4.0)
MCH: 31.9 pg (ref 26.0–34.0)
MCHC: 33.8 g/dL (ref 30.0–36.0)
MCV: 94.6 fL (ref 80.0–100.0)
Monocytes Absolute: 0.8 10*3/uL (ref 0.1–1.0)
Monocytes Relative: 11 %
Neutro Abs: 6.4 10*3/uL (ref 1.7–7.7)
Neutrophils Relative %: 82 %
Platelets: 190 10*3/uL (ref 150–400)
RBC: 5.04 MIL/uL (ref 3.87–5.11)
RDW: 12.9 % (ref 11.5–15.5)
WBC: 7.8 10*3/uL (ref 4.0–10.5)
nRBC: 0 % (ref 0.0–0.2)

## 2022-11-24 MED ORDER — METOPROLOL SUCCINATE ER 25 MG PO TB24
25.0000 mg | ORAL_TABLET | Freq: Every day | ORAL | Status: DC
  Administered 2022-11-25 – 2022-11-28 (×4): 25 mg via ORAL
  Filled 2022-11-24 (×4): qty 1

## 2022-11-24 MED ORDER — PANTOPRAZOLE SODIUM 40 MG PO TBEC
40.0000 mg | DELAYED_RELEASE_TABLET | Freq: Every day | ORAL | Status: DC
  Administered 2022-11-25 – 2022-11-28 (×4): 40 mg via ORAL
  Filled 2022-11-24 (×4): qty 1

## 2022-11-24 MED ORDER — ACETAMINOPHEN 650 MG RE SUPP: 650.0000 mg | Freq: Four times a day (QID) | RECTAL | Status: DC | PRN

## 2022-11-24 MED ORDER — POLYETHYLENE GLYCOL 3350 17 G PO PACK: 17.0000 g | PACK | Freq: Every day | ORAL | Status: DC | PRN

## 2022-11-24 MED ORDER — ACETAMINOPHEN 325 MG PO TABS
650.0000 mg | ORAL_TABLET | Freq: Four times a day (QID) | ORAL | Status: DC | PRN
  Administered 2022-11-24: 650 mg via ORAL
  Filled 2022-11-24: qty 2

## 2022-11-24 MED ORDER — LEVOTHYROXINE SODIUM 75 MCG PO TABS
75.0000 ug | ORAL_TABLET | Freq: Every day | ORAL | Status: DC
  Administered 2022-11-25 – 2022-11-28 (×4): 75 ug via ORAL
  Filled 2022-11-24 (×4): qty 1

## 2022-11-24 MED ORDER — EZETIMIBE 10 MG PO TABS
10.0000 mg | ORAL_TABLET | Freq: Every day | ORAL | Status: DC
  Administered 2022-11-25 – 2022-11-28 (×4): 10 mg via ORAL
  Filled 2022-11-24 (×4): qty 1

## 2022-11-24 MED ORDER — AMIODARONE HCL 200 MG PO TABS
200.0000 mg | ORAL_TABLET | Freq: Every day | ORAL | Status: DC
  Administered 2022-11-25 – 2022-11-28 (×4): 200 mg via ORAL
  Filled 2022-11-24 (×4): qty 1

## 2022-11-24 MED ORDER — SIMVASTATIN 20 MG PO TABS
20.0000 mg | ORAL_TABLET | Freq: Every day | ORAL | Status: DC
  Administered 2022-11-24 – 2022-11-27 (×4): 20 mg via ORAL
  Filled 2022-11-24 (×4): qty 1

## 2022-11-24 MED ORDER — SODIUM CHLORIDE 0.9% FLUSH
3.0000 mL | Freq: Two times a day (BID) | INTRAVENOUS | Status: DC
  Administered 2022-11-24 – 2022-11-28 (×8): 3 mL via INTRAVENOUS

## 2022-11-24 MED ORDER — SODIUM CHLORIDE 0.9 % IV SOLN: INTRAVENOUS | Status: DC

## 2022-11-24 MED ORDER — APIXABAN 2.5 MG PO TABS
2.5000 mg | ORAL_TABLET | Freq: Two times a day (BID) | ORAL | Status: DC
  Administered 2022-11-24 – 2022-11-28 (×8): 2.5 mg via ORAL
  Filled 2022-11-24 (×8): qty 1

## 2022-11-24 MED ORDER — SODIUM CHLORIDE 0.9 % IV BOLUS
1000.0000 mL | Freq: Once | INTRAVENOUS | Status: AC
  Administered 2022-11-24: 1000 mL via INTRAVENOUS

## 2022-11-24 NOTE — ED Notes (Signed)
Physical therapy at bedside

## 2022-11-24 NOTE — Telephone Encounter (Signed)
Completed FL2 form place on MD desk to sign.Marland KitchenRaechel Chute

## 2022-11-24 NOTE — Evaluation (Signed)
Physical Therapy Evaluation Patient Details Name: Jasmine Mccormick MRN: [id number] DOB: 04-Sep-1933 Today's Date: 11/24/2022  History of Present Illness  Pt is an 89 y.o. female who presented 11/24/22 s/p fall at home with near syncopal/syncopal episode. Per chart, family endorses pt had not been eating or drinking fluids as usual. PMH: HTN, HLD, hypothyroidism, ileostomy, memory loss, Meniere disease, osteoporosis, atrial fibrillation   Clinical Impression  Pt presents with condition above and deficits mentioned below, see PT Problem List. PTA, she was mod I using a SPC for functional mobility with a hx of several recent falls. Pt lives alone in a 1-level house with 4 STE. Her nieces live nearby and check on her daily. Family assists with iADLs and report pt has STM deficits at baseline. Pt displays deficits in STM, balance, gross strength, and activity tolerance. Pt also limited by symptomatic orthostatic hypotension today, see below. Currently, pt is requiring up to minA for bed mobility, transfers, and short gait bouts at EOB using a RW for pt safety. Pt will likely progress well once her BP and symptoms are more stable, thus she is anticipated to be able to go home upon d/c with follow-up PT. However, if she does not progress as anticipated, then may need to pursue short-term inpatient rehab. Will continue to follow acutely.    Orthostatics:  176/71 (94) supine 151/80 (99) sitting 107/78 (85) standing (pt shaking and reported feeling weak) Unable to tolerate standing 3 minutes 122/72 (87) sitting after standing first rep 112/85 (95) returning to supine after gait bout 166/66 (71) supine end of session       Recommendations for follow up therapy are one component of a multi-disciplinary discharge planning process, led by the attending physician.  Recommendations may be updated based on patient status, additional functional criteria and insurance authorization.  Follow Up Recommendations        Assistance Recommended at Discharge Intermittent Supervision/Assistance  Patient can return home with the following  A little help with walking and/or transfers;A little help with bathing/dressing/bathroom;Assistance with cooking/housework;Direct supervision/assist for medications management;Direct supervision/assist for financial management;Assist for transportation;Help with stairs or ramp for entrance    Equipment Recommendations Rollator (4 wheels);BSC/3in1  Recommendations for Other Services       Functional Status Assessment Patient has had a recent decline in their functional status and demonstrates the ability to make significant improvements in function in a reasonable and predictable amount of time.     Precautions / Restrictions Precautions Precautions: Fall Precaution Comments: watch BP (orthostatic 4/11), ileostomy Restrictions Weight Bearing Restrictions: No      Mobility  Bed Mobility Overal bed mobility: Needs Assistance Bed Mobility: Supine to Sit, Sit to Supine     Supine to sit: Min assist, HOB elevated Sit to supine: Min guard, HOB elevated   General bed mobility comments: Pt grabbing onto therapist in order to pull self up to sit R edge of stretcher, minA. Min guard for safety and extra time to return to supine.    Transfers Overall transfer level: Needs assistance Equipment used: Rolling walker (2 wheels), 1 person hand held assist Transfers: Sit to/from Stand Sit to Stand: Min assist           General transfer comment: MinA with x1 HHA the first rep and with RW the 2nd rep to stand from edge of stretcher, blocking pt's feet from sliding in her slippers. Pt leans posteriorly and displays some shakiness with standing, reporting feeling weak with BP noted to drop.  Ambulation/Gait Ambulation/Gait assistance: Min guard, Min assist Gait Distance (Feet): 5 Feet Assistive device: Rolling walker (2 wheels) Gait Pattern/deviations:  Step-through pattern, Decreased stride length, Leaning posteriorly Gait velocity: reduced Gait velocity interpretation: <1.31 ft/sec, indicative of household ambulator   General Gait Details: Pt taking lateral steps to foot and then to [facility] with RW, deferring further gait distance to pt having symptomatic orthostatic hypotension. Min guard-minA for balance and safety, cuing pt to slide RW with her.  Stairs            Wheelchair Mobility    Modified Rankin (Stroke Patients Only)       Balance Overall balance assessment: Needs assistance Sitting-balance support: Bilateral upper extremity supported, Single extremity supported, Feet supported Sitting balance-Leahy Scale: Poor Sitting balance - Comments: UE support on edge of stretcher, but stretcher was uneven and unable to lower the knees of it   Standing balance support: Bilateral upper extremity supported, During functional activity, Single extremity supported Standing balance-Leahy Scale: Poor Standing balance comment: Reliant on UE support and up to minA                             Pertinent Vitals/Pain Pain Assessment Pain Assessment: Faces Faces Pain Scale: Hurts little more Pain Location: L hand and elbow at sites of skin abrasions, R elbow at IV site Pain Descriptors / Indicators: Discomfort, Grimacing, Guarding, Sore Pain Intervention(s): Limited activity within patient's tolerance, Monitored during session, Repositioned, Other (comment) (applied tegaderm and non-stick pad with gauze wrapping where appropriate)    Home Living Family/patient expects to be discharged to:: Private residence Living Arrangements: Alone Available Help at Discharge: Family;Available PRN/intermittently (x3 nieces live nearby and check on her daily) Type of Home: House Home Access: Stairs to enter Entrance Stairs-Rails: Can reach both Entrance Stairs-Number of Steps: 4   Home Layout: One level Home Equipment: Cane - single  point      Prior Function Prior Level of Function : Needs assist  Cognitive Assist : ADLs (cognitive)   ADLs (Cognitive): Set up cues Physical Assist : ADLs (physical)   ADLs (physical): IADLs Mobility Comments: Mod I using SPC, x2 falls in past 2 days and a few others recently ADLs Comments: Nieces report some STM deficits at baseline; family sets up her pill box and manages her finances; pt does very light cooking or warms up microwavable food, otherwise family does shopping and majority of cooking; housekeeper comes to do laundry and clean every other week     Hand Dominance        Extremity/Trunk Assessment   Upper Extremity Assessment Upper Extremity Assessment: Defer to OT evaluation    Lower Extremity Assessment Lower Extremity Assessment: Generalized weakness (noted with functional mobility, skin abrasion to R knee likely from fall)    Cervical / Trunk Assessment Cervical / Trunk Assessment: Kyphotic  Communication   Communication: HOH  Cognition Arousal/Alertness: Awake/alert Behavior During Therapy: WFL for tasks assessed/performed Overall Cognitive Status: History of cognitive impairments - at baseline                                 General Comments: STM deficits at baseline per family. Pt asking same questions often, unsure if this is baseline or worse than usual due to being in ED with no windows or sense of time        General Comments General comments (  skin integrity, edema, etc.): Orthostatics: 176/71 (94) supine, 151/80 (99) sitting, 107/78 (85) standing (pt shaking and reported feeling weak), 122/72 (87) sitting after standing first rep, 112/85 (95) returning to supine after gait bout, 166/66 (71) supine end of session    Exercises     Assessment/Plan    PT Assessment Patient needs continued PT services  PT Problem List Decreased strength;Decreased activity tolerance;Decreased balance;Decreased mobility;Decreased  cognition;Cardiopulmonary status limiting activity;Decreased skin integrity       PT Treatment Interventions DME instruction;Gait training;Stair training;Therapeutic activities;Functional mobility training;Therapeutic exercise;Balance training;Neuromuscular re-education;Patient/family education;Cognitive remediation    PT Goals (Current goals can be found in the Care Plan section)  Acute Rehab PT Goals Patient Stated Goal: to improve PT Goal Formulation: With patient/family Time For Goal Achievement: 12/08/22 Potential to Achieve Goals: Good    Frequency Min 3X/week     Co-evaluation               AM-PAC PT "6 Clicks" Mobility  Outcome Measure Help needed turning from your back to your side while in a flat bed without using bedrails?: A Little Help needed moving from lying on your back to sitting on the side of a flat bed without using bedrails?: A Little Help needed moving to and from a bed to a chair (including a wheelchair)?: A Little Help needed standing up from a chair using your arms (e.g., wheelchair or bedside chair)?: A Little Help needed to walk in hospital room?: Total Help needed climbing 3-5 steps with a railing? : Total 6 Click Score: 14    End of Session Equipment Utilized During Treatment: Gait belt Activity Tolerance: Treatment limited secondary to medical complications (Comment) (limited by orthostatic hypotension) Patient left: in bed;with call bell/phone within reach;with family/visitor present Nurse Communication: Mobility status;Other (comment) (BP measurements) PT Visit Diagnosis: Unsteadiness on feet (R26.81);Other abnormalities of gait and mobility (R26.89);Muscle weakness (generalized) (M62.81);History of falling (Z91.81);Repeated falls (R29.6);Difficulty in walking, not elsewhere classified (R26.2)    Time: 0630-1601 PT Time Calculation (min) (ACUTE ONLY): 35 min   Charges:   PT Evaluation $PT Eval Moderate Complexity: 1 Mod PT  Treatments $Therapeutic Activity: 8-22 mins        Raymond Gurney, PT, DPT Acute Rehabilitation Services  Office: [phone]   Jewel Baize 11/24/2022, 5:44 PM

## 2022-11-24 NOTE — Telephone Encounter (Signed)
Routing as FYI

## 2022-11-24 NOTE — H&P (Addendum)
History and Physical   Jasmine Mccormick:[id number] DOB: 03-26-1934 DOA: 11/24/2022  PCP: Tresa Garter, MD   Patient coming from: Home  Chief Complaint: Falls  HPI: Jasmine Mccormick is a 89 y.o. female with medical history significant of ulcerative colitis, atrial fibrillation, mesenteric artery thrombosis, memory impairment, hypothyroidism, hypertension, hyperlipidemia presenting after multiple falls at home.  Patient had a fall yesterday and has some injuries on her hand and arm from this. She had another fall today where she felt lightheaded prior to this after standing up after using the bathroom.  Family reports patient has decreased p.o. intake for the last week or so.  She reports she has not had an appetite. Denies nausea nor feeling full.  She states she still tries to have some ice cream in the evenings but otherwise just seems to not have an appetite.  She has reported a little bit of epigastric pain in the ED but otherwise no abdominal pain.   states her ostomy output has been normal.  Patient denies fevers, chills, chest pain, shortness of breath, constipation, diarrhea, nausea, vomiting.  ED Course: Vital signs in the ED notable for blood pressure in the 160s to 180s systolic, heart rate in the 50s to 60s.  Lab workup included BMP with sodium 128, chloride 91, bicarb 19 with a gap of 18, BUN 81, creatinine elevated to 3.47 from baseline 1.1, glucose 115.  CBC with hemoglobin 16.1.  Urinalysis pending.  CT head and CT C-spine showed no acute abnormality.  No initial interventions thus far in the ED.  Review of Systems: As per HPI otherwise all other systems reviewed and are negative.  Past Medical History:  Diagnosis Date   COVID 12/18/2020   mild   HTN (hypertension)    Normal at home   Hyperlipidemia    Hypothyroidism    Ileostomy in place    Memory loss    mild but not dx by doctor per [place]   Meniere disease 1996   Osteoporosis    Persistent atrial  fibrillation    UC (ulcerative colitis)     Past Surgical History:  Procedure Laterality Date   CARDIOVERSION N/A 06/15/2021   Procedure: CARDIOVERSION;  Surgeon: Christell Constant, MD;  Location: [facility] ENDOSCOPY;  Service: Cardiovascular;  Laterality: N/A;   CARDIOVERSION N/A 10/26/2021   Procedure: CARDIOVERSION;  Surgeon: Jodelle Red, MD;  Location[facility] ENDOSCOPY;  Service: Cardiovascular;  Laterality: N/A;   COLECTOMY     COLONOSCOPY     Ear Shunt for Menier's     ENDARTERECTOMY MESENTERIC Right 03/29/2021   Procedure: MESENTERIC ARTERY THROMBECTOMY;  Surgeon: Nada Libman, MD;  Location: [facility] OR;  Service: Vascular;  Laterality: Right;   ILEOSTOMY     INTRAOPERATIVE ARTERIOGRAM Right 03/29/2021   Procedure: INTRA OPERATIVE AORTOGRAM;  Surgeon: Nada Libman, MD;  Location: [facility] OR;  Service: Vascular;  Laterality: Right;   KYPHOPLASTY N/A 01/07/2021   Procedure: Lumbar three Kyphoplasty;  Surgeon: Dawley, Alan Mulder, DO;  Location: [facility] OR;  Service: Neurosurgery;  Laterality: N/A;   PROCTECTOMY     ULTRASOUND GUIDANCE FOR VASCULAR ACCESS Right 03/29/2021   Procedure: ULTRASOUND GUIDANCE FOR VASCULAR ACCESS;  Surgeon: Nada Libman, MD;  Location: [facility] OR;  Service: Vascular;  Laterality: Right;    Social History  reports that she has never smoked. She has never used smokeless tobacco. She reports that she does not drink alcohol and does not use drugs.  No Known Allergies  Family  History  Problem Relation Age of Onset   Hypertension Other    Heart disease Other    Breast cancer Sister   Reviewed on admission  Prior to Admission medications   Medication Sig Start Date End Date Taking? Authorizing Provider  omeprazole (PRILOSEC) 20 MG capsule Take 1 capsule (20 mg total) by mouth daily. 09/15/22   Meredith Pel, NP  amiodarone (PACERONE) 200 MG tablet TAKE 1 TABLET BY MOUTH EVERY DAY 07/21/22   Jodelle Red, MD  apixaban (ELIQUIS) 5 MG TABS tablet TAKE 1  TABLET BY MOUTH 2 TIMES DAILY. 08/10/22   Jodelle Red, MD  Calcium-Phosphorus-Vitamin D (CITRACAL +D3 PO)  05/25/22   [provider]  ezetimibe (ZETIA) 10 MG tablet Take 1 tablet by mouth once daily 10/04/22   Plotnikov, Georgina Quint, MD  levothyroxine (SYNTHROID) 75 MCG tablet TAKE 1 TABLET BY MOUTH EVERY DAY 10/05/22   Plotnikov, Georgina Quint, MD  metoprolol succinate (TOPROL-XL) 25 MG 24 hr tablet Take 1 tablet (25 mg total) by mouth daily. Take with or immediately following a meal. 05/11/22   Jodelle Red, MD  Multiple Vitamins-Minerals (PRESERVISION AREDS 2 PO) Take by mouth 2 (two) times daily.    [provider]  simvastatin (ZOCOR) 20 MG tablet Take 1 tablet (20 mg total) by mouth daily at 6 PM. 04/11/22   Plotnikov, Georgina Quint, MD  triamcinolone ointment (KENALOG) 0.1 % Apply 1 application topically 3 (three) times daily as needed. 08/10/21   Plotnikov, Georgina Quint, MD    Physical Exam: Vitals:   11/24/22 1057 11/24/22 1447  BP: (!) 164/73 (!) 185/72  Pulse: (!) 58 62  Resp: 16 16  Temp: 98.1 F (36.7 C) 97.9 F (36.6 C)  TempSrc: Oral Oral  SpO2: 99% 100%    Physical Exam Constitutional:      General: She is not in acute distress.    Appearance: Normal appearance.  HENT:     Head: Normocephalic and atraumatic.     Mouth/Throat:     Mouth: Mucous membranes are moist.     Pharynx: Oropharynx is clear.  Eyes:     Extraocular Movements: Extraocular movements intact.     Pupils: Pupils are equal, round, and reactive to light.  Cardiovascular:     Rate and Rhythm: Normal rate and regular rhythm.     Pulses: Normal pulses.     Heart sounds: Normal heart sounds.  Pulmonary:     Effort: Pulmonary effort is normal. No respiratory distress.     Breath sounds: Normal breath sounds.  Abdominal:     General: Bowel sounds are normal. There is no distension.     Palpations: Abdomen is soft.     Tenderness: There is no abdominal tenderness.   Musculoskeletal:        General: No swelling or deformity.  Skin:    General: Skin is warm and dry.  Neurological:     General: No focal deficit present.     Mental Status: Mental status is at baseline.    Labs on Admission: I have personally reviewed following labs and imaging studies  CBC: Recent Labs  Lab 11/24/22 1242  WBC 7.8  NEUTROABS 6.4  HGB 16.1*  HCT 47.7*  MCV 94.6  PLT 190    Basic Metabolic Panel: Recent Labs  Lab 11/24/22 1242  NA 128*  K 4.1  CL 91*  CO2 19*  GLUCOSE 115*  BUN 81*  CREATININE 3.47*  CALCIUM 8.9    GFR: CrCl  cannot be calculated (Unknown ideal weight.).  Liver Function Tests: No results for input(s): "AST", "ALT", "ALKPHOS", "BILITOT", "PROT", "ALBUMIN" in the last 168 hours.  Urine analysis:    Component Value Date/Time   COLORURINE YELLOW 04/11/2022 1202   APPEARANCEUR Turbid (A) 04/11/2022 1202   LABSPEC 1.020 04/11/2022 1202   PHURINE 6.0 04/11/2022 1202   GLUCOSEU NEGATIVE 04/11/2022 1202   HGBUR TRACE-INTACT (A) 04/11/2022 1202   BILIRUBINUR NEGATIVE 04/11/2022 1202   BILIRUBINUR neg 02/20/2020 1316   KETONESUR NEGATIVE 04/11/2022 1202   PROTEINUR Positive (A) 02/20/2020 1316   UROBILINOGEN 0.2 04/11/2022 1202   NITRITE NEGATIVE 04/11/2022 1202   LEUKOCYTESUR LARGE (A) 04/11/2022 1202    Radiological Exams on Admission: CT Head Wo Contrast  Result Date: 11/24/2022 CLINICAL DATA:  Neck trauma (Age >= 65y); Head trauma, minor (Age >= 65y) EXAM: CT HEAD WITHOUT CONTRAST CT CERVICAL SPINE WITHOUT CONTRAST TECHNIQUE: Multidetector CT imaging of the head and cervical spine was performed following the standard protocol without intravenous contrast. Multiplanar CT image reconstructions of the cervical spine were also generated. RADIATION DOSE REDUCTION: This exam was performed according to the departmental dose-optimization program which includes automated exposure control, adjustment of the mA and/or kV according to  patient size and/or use of iterative reconstruction technique. COMPARISON:  None Available. FINDINGS: CT HEAD FINDINGS Brain: No evidence of acute infarction, hemorrhage, hydrocephalus, extra-axial collection or mass lesion/mass effect. Vascular: No hyperdense vessel.  Calcific atherosclerosis. Skull: No acute fracture. Sinuses/Orbits: No acute finding. Other: Postsurgical changes of right mastoidectomy. No mastoid effusions. CT CERVICAL SPINE FINDINGS Alignment: No substantial sagittal subluxation. Skull base and vertebrae: Vertebral body heights are maintained. No evidence of acute fracture. Osteopenia. Soft tissues and spinal canal: No prevertebral fluid or swelling. No visible canal hematoma. Disc levels:  Multilevel facet and uncovertebral hypertrophy. Upper chest: Visualized lung apices are clear. IMPRESSION: 1. No evidence of acute intracranial abnormality. 2. No evidence of acute fracture or traumatic malalignment. Electronically Signed   By: Feliberto HartsFrederick S Jones M.D.   On: 11/24/2022 12:05   CT Cervical Spine Wo Contrast  Result Date: 11/24/2022 CLINICAL DATA:  Neck trauma (Age >= 65y); Head trauma, minor (Age >= 65y) EXAM: CT HEAD WITHOUT CONTRAST CT CERVICAL SPINE WITHOUT CONTRAST TECHNIQUE: Multidetector CT imaging of the head and cervical spine was performed following the standard protocol without intravenous contrast. Multiplanar CT image reconstructions of the cervical spine were also generated. RADIATION DOSE REDUCTION: This exam was performed according to the departmental dose-optimization program which includes automated exposure control, adjustment of the mA and/or kV according to patient size and/or use of iterative reconstruction technique. COMPARISON:  None Available. FINDINGS: CT HEAD FINDINGS Brain: No evidence of acute infarction, hemorrhage, hydrocephalus, extra-axial collection or mass lesion/mass effect. Vascular: No hyperdense vessel.  Calcific atherosclerosis. Skull: No acute fracture.  Sinuses/Orbits: No acute finding. Other: Postsurgical changes of right mastoidectomy. No mastoid effusions. CT CERVICAL SPINE FINDINGS Alignment: No substantial sagittal subluxation. Skull base and vertebrae: Vertebral body heights are maintained. No evidence of acute fracture. Osteopenia. Soft tissues and spinal canal: No prevertebral fluid or swelling. No visible canal hematoma. Disc levels:  Multilevel facet and uncovertebral hypertrophy. Upper chest: Visualized lung apices are clear. IMPRESSION: 1. No evidence of acute intracranial abnormality. 2. No evidence of acute fracture or traumatic malalignment. Electronically Signed   By: Feliberto HartsFrederick S Jones M.D.   On: 11/24/2022 12:05    EKG: Independently reviewed.  Sinus rhythm at 63 bpm.  Nonspecific T wave flattening.  Low voltage  V1.  Assessment/Plan Principal Problem:   AKI (acute kidney injury) Active Problems:   Dyslipidemia   Essential hypertension   Ulcerative colitis   Hypothyroidism   Mesenteric artery thrombosis   Paroxysmal atrial fibrillation   Memory problem   Near syncope   AKI Near syncope > Patient presenting after 2 falls at home.  Had one yesterday one today where she felt somewhat lightheaded after standing up from using the bathroom and was walking out when she fell. > Noted to have creatinine elevated to 3.47 from baseline 1.1; with BUN of 81, bicarb 19, gap 18, sodium 128.  This is in the setting of decreased p.o. intake for the last week per family. > Suspect AKI secondary to dehydration and falls could be related to orthostatic syncope in this setting, though the patient is hypertensive in the ED. unclear reason for decreased p.o. intake at this time. - Monitor in telemetry - IV fluids - Trend renal function and electrolytes - Orthostatic vital signs - Echocardiogram - PT/OT eval and treat Addendum > Orthostatics Positive: BP 176/71 (94) supine, 151/80 (99) sitting, 107/78 (85) standing, could not tolerate standing  3 minutes.  - Continue with IVF will Give 1L bolus over 2 hours as well.  Atrial fibrillation - Continue home Eliquis, reduced to 2.5 mg twice daily for now given AKI - Continue home amiodarone - Continue home metoprolol 25 mg daily, has had dose reduced recently due to bradycardia  Hypothyroidism - Continue home Synthroid  Hyperlipidemia - Continue home simvastatin, Zetia  Ulcerative colitis > Status post remote total colostomy with end ileostomy.  Follows with GI. - Noted  History of mesenteric artery thrombosis - On Eliquis as above  DVT prophylaxis: Eliquis Code Status:   Full Family Communication:  Updated at bedside.   Disposition Plan:   Patient is from:  Home  Anticipated DC to:  Home  Anticipated DC date:  1 to 3 days  Anticipated DC barriers: None  Consults called:  None Admission status:  Observation, telemetry  Severity of Illness: The appropriate patient status for this patient is OBSERVATION. Observation status is judged to be reasonable and necessary in order to provide the required intensity of service to ensure the patient's safety. The patient's presenting symptoms, physical exam findings, and initial radiographic and laboratory data in the context of their medical condition is felt to place them at decreased risk for further clinical deterioration. Furthermore, it is anticipated that the patient will be medically stable for discharge from the hospital within 2 midnights of admission.    Synetta Fail MD [facility]  How to contact the [facility] Attending or Consulting provider 7A - 7P or covering provider during after hours 7P -7A, for this patient?   Check the care team in [facility] and look for a) attending/consulting [facility] provider listed and b) the [facility] team listed Log into [email or web address] and use [place]'s universal password to access. If you do not have the password, please contact the hospital operator. Locate the [facility] provider you are looking for  under [facility] and page to a number that you can be directly reached. If you still have difficulty reaching the provider, please page the [facility] (Director on Call) for the Hospitalists listed on amion for assistance.  11/24/2022, 3:26 PM

## 2022-11-24 NOTE — ED Notes (Signed)
ED TO INPATIENT HANDOFF REPORT  ED Nurse Name and Phone #: Wilmetta Speiser   [phone]  S Name/Age/Gender Jasmine Mccormick 89 y.o. female Room/Bed: 040C/040C  Code Status   Code Status: Full Code  Home/SNF/Other Home Patient oriented to: self, place, time, and situation Is this baseline? Yes   Triage Complete: Triage complete  Chief Complaint AKI (acute kidney injury) [N17.9]  Triage Note Pt to ED via EMS from home after a fall. Per pt she was walking by her dresser bumped into and fell down. Pt states she fell on left arm. +thinners, denies LOC. Pt denies any pain in triage. Aox4, has small abrasions to left hand and right upper arm controlled bleeding.    Allergies No Known Allergies  Level of Care/Admitting Diagnosis ED Disposition     ED Disposition  Admit   Condition  --   Comment  [facility] [[id number]]  Level of Care: Telemetry Medical [104]  [facility] patient [facility] or [facility] if equivalent level of care is available:: No  Covid Evaluation: Asymptomatic - no recent exposure (last 10 days) testing not required  Diagnosis: AKI (acute kidney injury) [[id number]][[id number]]  Admitting Physician: Synetta FailMELVIN, ALEXANDER B [[id number]]  Attending Physician: Synetta FailMELVIN, ALEXANDER B [[id number]]          B Medical/Surgery History Past Medical History:  Diagnosis Date   COVID 12/18/2020   mild   HTN (hypertension)    Normal at home   Hyperlipidemia    Hypothyroidism    Ileostomy in place    Memory loss    mild but not dx by doctor per [place]   Meniere disease 1996   Osteoporosis    Persistent atrial fibrillation    UC (ulcerative colitis)    Past Surgical History:  Procedure Laterality Date   CARDIOVERSION N/A 06/15/2021   Procedure: CARDIOVERSION;  Surgeon: Christell Constanthandrasekhar, Mahesh A, MD;  Location: [facility] ENDOSCOPY;  Service: Cardiovascular;  Laterality: N/A;   CARDIOVERSION N/A 10/26/2021   Procedure: CARDIOVERSION;  Surgeon:  Jodelle Redhristopher, Bridgette, MD;  [facility];  Service: Cardiovascular;  Laterality: N/A;   COLECTOMY     COLONOSCOPY     Ear Shunt for Menier's     ENDARTERECTOMY MESENTERIC Right 03/29/2021   Procedure: MESENTERIC ARTERY THROMBECTOMY;  Surgeon: Nada LibmanBrabham, Vance W, MD;  Location: [facility] OR;  Service: Vascular;  Laterality: Right;   ILEOSTOMY     INTRAOPERATIVE ARTERIOGRAM Right 03/29/2021   Procedure: INTRA OPERATIVE AORTOGRAM;  Surgeon: Nada LibmanBrabham, Vance W, MD;  [facility];  Service: Vascular;  Laterality: Right;   KYPHOPLASTY N/A 01/07/2021   Procedure: Lumbar three Kyphoplasty;  Surgeon: Dawley, Alan Mulderroy C, DO;  Location: [facility] OR;  Service: Neurosurgery;  Laterality: N/A;   PROCTECTOMY     ULTRASOUND GUIDANCE FOR VASCULAR ACCESS Right 03/29/2021   Procedure: ULTRASOUND GUIDANCE FOR VASCULAR ACCESS;  Surgeon: Nada LibmanBrabham, Vance W, MD;  [facility];  Service: Vascular;  Laterality: Right;     A IV Location/Drains/Wounds Patient Lines/Drains/Airways Status     Active Line/Drains/Airways     Name Placement date Placement time Site Days   Peripheral IV 11/24/22 20 G Anterior;Proximal;Right Antecubital 11/24/22  1642  Antecubital  less than 1   Ileostomy Standard (end) RLQ --  --  RLQ  --   Incision (Closed) 03/29/21 Groin Right 03/29/21  0851  -- 605            Intake/Output Last 24 hours No intake or output data in  the 24 hours ending 11/24/22 1653  Labs/Imaging Results for orders placed or performed during the hospital encounter of 11/24/22 (from the past 48 hour(s))  Basic metabolic panel     Status: Abnormal   Collection Time: 11/24/22 12:42 PM  Result Value Ref Range   Sodium 128 (L) 135 - 145 mmol/L   Potassium 4.1 3.5 - 5.1 mmol/L   Chloride 91 (L) 98 - 111 mmol/L   CO2 19 (L) 22 - 32 mmol/L   Glucose, Bld 115 (H) 70 - 99 mg/dL    Comment: Glucose reference range applies only to samples taken after fasting for at least 8 hours.   BUN 81 (H) 8 - 23 mg/dL   Creatinine,  Ser 5.40 (H) 0.44 - 1.00 mg/dL   Calcium 8.9 8.9 - 98.1 mg/dL   GFR, Estimated 12 (L) >60 mL/min    Comment: (NOTE) Calculated using the CKD-EPI Creatinine Equation (2021)    Anion gap 18 (H) 5 - 15    Comment: Performed at [facility], [address] [place], [place] [address]  CBC with Differential     Status: Abnormal   Collection Time: 11/24/22 12:42 PM  Result Value Ref Range   WBC 7.8 4.0 - 10.5 K/uL   RBC 5.04 3.87 - 5.11 MIL/uL   Hemoglobin 16.1 (H) 12.0 - 15.0 g/dL   HCT 82.9 (H) 56.2 - 13.0 %   MCV 94.6 80.0 - 100.0 fL   MCH 31.9 26.0 - 34.0 pg   MCHC 33.8 30.0 - 36.0 g/dL   RDW 86.5 78.4 - 69.6 %   Platelets 190 150 - 400 K/uL   nRBC 0.0 0.0 - 0.2 %   Neutrophils Relative % 82 %   Neutro Abs 6.4 1.7 - 7.7 K/uL   Lymphocytes Relative 6 %   Lymphs Abs 0.5 (L) 0.7 - 4.0 K/uL   Monocytes Relative 11 %   Monocytes Absolute 0.8 0.1 - 1.0 K/uL   Eosinophils Relative 0 %   Eosinophils Absolute 0.0 0.0 - 0.5 K/uL   Basophils Relative 0 %   Basophils Absolute 0.0 0.0 - 0.1 K/uL   Immature Granulocytes 1 %   Abs Immature Granulocytes 0.05 0.00 - 0.07 K/uL    Comment: Performed at [facility], [address], [place], [place] [address]   CT Head Wo Contrast  Result Date: 11/24/2022 CLINICAL DATA:  Neck trauma (Age >= 65y); Head trauma, minor (Age >= 65y) EXAM: CT HEAD WITHOUT CONTRAST CT CERVICAL SPINE WITHOUT CONTRAST TECHNIQUE: Multidetector CT imaging of the head and cervical spine was performed following the standard protocol without intravenous contrast. Multiplanar CT image reconstructions of the cervical spine were also generated. RADIATION DOSE REDUCTION: This exam was performed according to the departmental dose-optimization program which includes automated exposure control, adjustment of the mA and/or kV according to patient size and/or use of iterative reconstruction technique. COMPARISON:  None Available. FINDINGS: CT HEAD FINDINGS Brain: No evidence of  acute infarction, hemorrhage, hydrocephalus, extra-axial collection or mass lesion/mass effect. Vascular: No hyperdense vessel.  Calcific atherosclerosis. Skull: No acute fracture. Sinuses/Orbits: No acute finding. Other: Postsurgical changes of right mastoidectomy. No mastoid effusions. CT CERVICAL SPINE FINDINGS Alignment: No substantial sagittal subluxation. Skull base and vertebrae: Vertebral body heights are maintained. No evidence of acute fracture. Osteopenia. Soft tissues and spinal canal: No prevertebral fluid or swelling. No visible canal hematoma. Disc levels:  Multilevel facet and uncovertebral hypertrophy. Upper chest: Visualized lung apices are clear. IMPRESSION: 1.  No evidence of acute intracranial abnormality. 2. No evidence of acute fracture or traumatic malalignment. Electronically Signed   By: Feliberto Harts M.D.   On: 11/24/2022 12:05   CT Cervical Spine Wo Contrast  Result Date: 11/24/2022 CLINICAL DATA:  Neck trauma (Age >= 65y); Head trauma, minor (Age >= 65y) EXAM: CT HEAD WITHOUT CONTRAST CT CERVICAL SPINE WITHOUT CONTRAST TECHNIQUE: Multidetector CT imaging of the head and cervical spine was performed following the standard protocol without intravenous contrast. Multiplanar CT image reconstructions of the cervical spine were also generated. RADIATION DOSE REDUCTION: This exam was performed according to the departmental dose-optimization program which includes automated exposure control, adjustment of the mA and/or kV according to patient size and/or use of iterative reconstruction technique. COMPARISON:  None Available. FINDINGS: CT HEAD FINDINGS Brain: No evidence of acute infarction, hemorrhage, hydrocephalus, extra-axial collection or mass lesion/mass effect. Vascular: No hyperdense vessel.  Calcific atherosclerosis. Skull: No acute fracture. Sinuses/Orbits: No acute finding. Other: Postsurgical changes of right mastoidectomy. No mastoid effusions. CT CERVICAL SPINE FINDINGS  Alignment: No substantial sagittal subluxation. Skull base and vertebrae: Vertebral body heights are maintained. No evidence of acute fracture. Osteopenia. Soft tissues and spinal canal: No prevertebral fluid or swelling. No visible canal hematoma. Disc levels:  Multilevel facet and uncovertebral hypertrophy. Upper chest: Visualized lung apices are clear. IMPRESSION: 1. No evidence of acute intracranial abnormality. 2. No evidence of acute fracture or traumatic malalignment. Electronically Signed   By: Feliberto Harts M.D.   On: 11/24/2022 12:05    Pending Labs Unresulted Labs (From admission, onward)     Start     Ordered   11/25/22 0500  Basic metabolic panel  Tomorrow morning,   R        11/24/22 1526   11/25/22 0500  CBC  Tomorrow morning,   R        11/24/22 1526   11/24/22 1242  Urinalysis, Routine w reflex microscopic -Urine, Clean Catch  Once,   URGENT       Question:  Specimen Source  Answer:  Urine, Clean Catch   11/24/22 1241            Vitals/Pain Today's Vitals   11/24/22 1055 11/24/22 1057 11/24/22 1447  BP:  (!) 164/73 (!) 185/72  Pulse:  (!) 58 62  Resp:  16 16  Temp:  98.1 F (36.7 C) 97.9 F (36.6 C)  TempSrc:  Oral Oral  SpO2:  99% 100%  PainSc: 0-No pain      Isolation Precautions No active isolations  Medications Medications  amiodarone (PACERONE) tablet 200 mg (has no administration in time range)  ezetimibe (ZETIA) tablet 10 mg (has no administration in time range)  simvastatin (ZOCOR) tablet 20 mg (has no administration in time range)  levothyroxine (SYNTHROID) tablet 75 mcg (has no administration in time range)  pantoprazole (PROTONIX) EC tablet 40 mg (has no administration in time range)  sodium chloride flush (NS) 0.9 % injection 3 mL (has no administration in time range)  0.9 %  sodium chloride infusion (has no administration in time range)  acetaminophen (TYLENOL) tablet 650 mg (has no administration in time range)    Or  acetaminophen  (TYLENOL) suppository 650 mg (has no administration in time range)  polyethylene glycol (MIRALAX / GLYCOLAX) packet 17 g (has no administration in time range)  apixaban (ELIQUIS) tablet 2.5 mg (has no administration in time range)  metoprolol succinate (TOPROL-XL) 24 hr tablet 25 mg (has no administration in time range)  Mobility walks with device     Focused Assessments Neuro Assessment Handoff:  Swallow screen pass? Yes          Neuro Assessment: Within Defined Limits Neuro Checks:      Has TPA been given? No If patient is a Neuro Trauma and patient is going to OR before floor call report to 4N Charge nurse: [phone] or [phone]   R Recommendations: See Admitting Provider Note  Report given to:   Additional Notes: aox4,, family at bedside..  family would like pt to be able to return home, possibly with home health, and maintain independence.   Skin tear hand.

## 2022-11-24 NOTE — Telephone Encounter (Signed)
Returned call to patient's niece, husband answered the phone and states they both  called and didn't realize. They just wanted Dr. Cristal Deer to know patient was being admitted to the hospital. Assured Mr. Delane Ginger that we have let her know and the hospital will consult [name] as needed.

## 2022-11-24 NOTE — ED Triage Notes (Signed)
Pt to ED via EMS from home after a fall. Per pt she was walking by her dresser bumped into and fell down. Pt states she fell on left arm. +thinners, denies LOC. Pt denies any pain in triage. Aox4, has small abrasions to left hand and right upper arm controlled bleeding.

## 2022-11-24 NOTE — Telephone Encounter (Signed)
Patient's niece is calling to let cardiologist know that she is admitted currently at [facility] and would like a call back to discuss.

## 2022-11-24 NOTE — Telephone Encounter (Signed)
Pts niece Stanton Kidney, wants to let Dr. Cristal Deer know that EMS had to be called on the pt this morning. Stanton Kidney has been down with a stomach bug since yesterday. Stanton Kidney says that the pt states she also felt sick, she's been dehydrated and weak, but Stanton Kidney says that the pt never got sick. Stanton Kidney states that the pt fell last night, and the night prior. She says that pt was not wearing her life alert necklace when Debra's husband found the pt last night. Stanton Kidney states she just wanted to let Dr. Cristal Deer know, incase she gets involved. Stanton Kidney also states that they're trying to get the pt into an assisted living type community, and states that when they try to get the pt in there, the doctor will have to fill out a form. Stanton Kidney wants to know if Dr. Cristal Deer would be the one to fill out that form for the pt or if it would be Dr. Posey Rea (pts pcp), when/if the time comes.

## 2022-11-24 NOTE — ED Provider Notes (Signed)
[place] EMERGENCY DEPARTMENT AT [facility] Provider Note   CSN: [id number] Arrival date & time: 11/24/22  1052     History  Chief Complaint  Patient presents with   Jasmine Mccormick is a 89 y.o. female. Patient presents to the emergency department for evaluation secondary to fall.  Patient reports a near syncopal/syncopal episode that occurred this morning.  Patient also reports a fall yesterday.  Patient has skin tear to the left dorsal hand and right upper arm.  She denies any knowledge of hitting her head.  Patient does take blood thinners.  Patient denies other complaints at this time but family endorses that patient has been not drinking fluids as usual and has not been eating as much as usual over the past week.  HPI     Home Medications Prior to Admission medications   Medication Sig Start Date End Date Taking? Authorizing Provider  omeprazole (PRILOSEC) 20 MG capsule Take 1 capsule (20 mg total) by mouth daily. 09/15/22   Meredith Pel, NP  amiodarone (PACERONE) 200 MG tablet TAKE 1 TABLET BY MOUTH EVERY DAY 07/21/22   Jodelle Red, MD  apixaban (ELIQUIS) 5 MG TABS tablet TAKE 1 TABLET BY MOUTH 2 TIMES DAILY. 08/10/22   Jodelle Red, MD  Calcium-Phosphorus-Vitamin D (CITRACAL +D3 PO)  05/25/22   [provider]  ezetimibe (ZETIA) 10 MG tablet Take 1 tablet by mouth once daily 10/04/22   Plotnikov, Georgina Quint, MD  levothyroxine (SYNTHROID) 75 MCG tablet TAKE 1 TABLET BY MOUTH EVERY DAY 10/05/22   Plotnikov, Georgina Quint, MD  metoprolol succinate (TOPROL-XL) 25 MG 24 hr tablet Take 1 tablet (25 mg total) by mouth daily. Take with or immediately following a meal. 05/11/22   Jodelle Red, MD  Multiple Vitamins-Minerals (PRESERVISION AREDS 2 PO) Take by mouth 2 (two) times daily.    [provider]  simvastatin (ZOCOR) 20 MG tablet Take 1 tablet (20 mg total) by mouth daily at 6 PM. 04/11/22   Plotnikov, Georgina Quint, MD   triamcinolone ointment (KENALOG) 0.1 % Apply 1 application topically 3 (three) times daily as needed. 08/10/21   Plotnikov, Georgina Quint, MD      Allergies    Patient has no known allergies.    Review of Systems   Review of Systems  Physical Exam Updated Vital Signs BP (!) 185/72 (BP Location: Left Arm)   Pulse 62   Temp 97.9 F (36.6 C) (Oral)   Resp 16   LMP  (LMP Unknown)   SpO2 100%  Physical Exam Vitals and nursing note reviewed.  Constitutional:      General: She is not in acute distress.    Appearance: She is well-developed.  HENT:     Head: Normocephalic and atraumatic.  Eyes:     Conjunctiva/sclera: Conjunctivae normal.  Cardiovascular:     Rate and Rhythm: Normal rate and regular rhythm.     Heart sounds: No murmur heard. Pulmonary:     Effort: Pulmonary effort is normal. No respiratory distress.     Breath sounds: Normal breath sounds.  Abdominal:     Palpations: Abdomen is soft.     Tenderness: There is no abdominal tenderness.  Musculoskeletal:        General: No swelling.     Cervical back: Neck supple.  Skin:    General: Skin is warm and dry.     Capillary Refill: Capillary refill takes less than 2 seconds.  Comments: Skin tear to right upper arm, dorsal left hand  Neurological:     Mental Status: She is alert.  Psychiatric:        Mood and Affect: Mood normal.     ED Results / Procedures / Treatments   Labs (all labs ordered are listed, but only abnormal results are displayed) Labs Reviewed  BASIC METABOLIC PANEL - Abnormal; Notable for the following components:      Result Value   Sodium 128 (*)    Chloride 91 (*)    CO2 19 (*)    Glucose, Bld 115 (*)    BUN 81 (*)    Creatinine, Ser 3.47 (*)    GFR, Estimated 12 (*)    Anion gap 18 (*)    All other components within normal limits  CBC WITH DIFFERENTIAL/PLATELET - Abnormal; Notable for the following components:   Hemoglobin 16.1 (*)    HCT 47.7 (*)    Lymphs Abs 0.5 (*)    All  other components within normal limits  URINALYSIS, ROUTINE W REFLEX MICROSCOPIC    EKG None  Radiology CT Head Wo Contrast  Result Date: 11/24/2022 CLINICAL DATA:  Neck trauma (Age >= 65y); Head trauma, minor (Age >= 65y) EXAM: CT HEAD WITHOUT CONTRAST CT CERVICAL SPINE WITHOUT CONTRAST TECHNIQUE: Multidetector CT imaging of the head and cervical spine was performed following the standard protocol without intravenous contrast. Multiplanar CT image reconstructions of the cervical spine were also generated. RADIATION DOSE REDUCTION: This exam was performed according to the departmental dose-optimization program which includes automated exposure control, adjustment of the mA and/or kV according to patient size and/or use of iterative reconstruction technique. COMPARISON:  None Available. FINDINGS: CT HEAD FINDINGS Brain: No evidence of acute infarction, hemorrhage, hydrocephalus, extra-axial collection or mass lesion/mass effect. Vascular: No hyperdense vessel.  Calcific atherosclerosis. Skull: No acute fracture. Sinuses/Orbits: No acute finding. Other: Postsurgical changes of right mastoidectomy. No mastoid effusions. CT CERVICAL SPINE FINDINGS Alignment: No substantial sagittal subluxation. Skull base and vertebrae: Vertebral body heights are maintained. No evidence of acute fracture. Osteopenia. Soft tissues and spinal canal: No prevertebral fluid or swelling. No visible canal hematoma. Disc levels:  Multilevel facet and uncovertebral hypertrophy. Upper chest: Visualized lung apices are clear. IMPRESSION: 1. No evidence of acute intracranial abnormality. 2. No evidence of acute fracture or traumatic malalignment. Electronically Signed   By: Feliberto Harts M.D.   On: 11/24/2022 12:05   CT Cervical Spine Wo Contrast  Result Date: 11/24/2022 CLINICAL DATA:  Neck trauma (Age >= 65y); Head trauma, minor (Age >= 65y) EXAM: CT HEAD WITHOUT CONTRAST CT CERVICAL SPINE WITHOUT CONTRAST TECHNIQUE:  Multidetector CT imaging of the head and cervical spine was performed following the standard protocol without intravenous contrast. Multiplanar CT image reconstructions of the cervical spine were also generated. RADIATION DOSE REDUCTION: This exam was performed according to the departmental dose-optimization program which includes automated exposure control, adjustment of the mA and/or kV according to patient size and/or use of iterative reconstruction technique. COMPARISON:  None Available. FINDINGS: CT HEAD FINDINGS Brain: No evidence of acute infarction, hemorrhage, hydrocephalus, extra-axial collection or mass lesion/mass effect. Vascular: No hyperdense vessel.  Calcific atherosclerosis. Skull: No acute fracture. Sinuses/Orbits: No acute finding. Other: Postsurgical changes of right mastoidectomy. No mastoid effusions. CT CERVICAL SPINE FINDINGS Alignment: No substantial sagittal subluxation. Skull base and vertebrae: Vertebral body heights are maintained. No evidence of acute fracture. Osteopenia. Soft tissues and spinal canal: No prevertebral fluid or swelling. No visible canal  hematoma. Disc levels:  Multilevel facet and uncovertebral hypertrophy. Upper chest: Visualized lung apices are clear. IMPRESSION: 1. No evidence of acute intracranial abnormality. 2. No evidence of acute fracture or traumatic malalignment. Electronically Signed   By: Feliberto HartsFrederick S Jones M.D.   On: 11/24/2022 12:05    Procedures Procedures    Medications Ordered in ED Medications  amiodarone (PACERONE) tablet 200 mg (has no administration in time range)  ezetimibe (ZETIA) tablet 10 mg (has no administration in time range)  simvastatin (ZOCOR) tablet 20 mg (has no administration in time range)  levothyroxine (SYNTHROID) tablet 75 mcg (has no administration in time range)  pantoprazole (PROTONIX) EC tablet 40 mg (has no administration in time range)  sodium chloride flush (NS) 0.9 % injection 3 mL (has no administration in time  range)  0.9 %  sodium chloride infusion (has no administration in time range)  acetaminophen (TYLENOL) tablet 650 mg (has no administration in time range)    Or  acetaminophen (TYLENOL) suppository 650 mg (has no administration in time range)  polyethylene glycol (MIRALAX / GLYCOLAX) packet 17 g (has no administration in time range)    ED Course/ Medical Decision Making/ A&P Clinical Course as of 11/24/22 1551  Thu Nov 24, 2022  1500 In w EMS, unwitnesses CT bumped dresser and fell? Maybe syncope  [LS]  1501 [ ]  UA and home [LS]    Clinical Course User Index [LS] Curley SpiceStanek, Laurie, MD                             Medical Decision Making Amount and/or Complexity of Data Reviewed Labs: ordered. Radiology: ordered.   This patient presents to the ED for concern of fall with possible syncope, this involves an extensive number of treatment options, and is a complaint that carries with it a high risk of complications and morbidity.  The differential diagnosis includes but is not limited to UTI, intracranial abnormality, failure to thrive, metabolic abnormality, and others   Co morbidities that complicate the patient evaluation  Hypertension, ileostomy in place due to UC, memory loss, Mnire's disease   Additional history obtained:  Additional history obtained from EMS and family at bedside   Lab Tests:  I Ordered, and personally interpreted labs.  The pertinent results include: Sodium 128, creatinine 3.47, BUN 81   Imaging Studies ordered:  I ordered imaging studies including CT head and CT cervical spine I independently visualized and interpreted imaging which showed  1. No evidence of acute intracranial abnormality.  2. No evidence of acute fracture or traumatic malalignment.   I agree with the radiologist interpretation   Cardiac Monitoring: / EKG:  The patient was maintained on a cardiac monitor.  I personally viewed and interpreted the cardiac monitored which showed  an underlying rhythm of: Sinus rhythm   Consultations Obtained:  I requested consultation with the hospitalist, Dr. Alinda MoneyMelvin, and discussed lab and imaging findings as well as pertinent plan - they recommend: Admission    Test / Admission - Considered:  Patient appears to have an acute kidney injury and will need admission for further fluid management and repeat lab work        Final Clinical Impression(s) / ED Diagnoses Final diagnoses:  AKI (acute kidney injury)  Fall, initial encounter    Rx / DC Orders ED Discharge Orders     None         Pamala DuffelMcCauley, Julie-Anne Torain B, PA-C 11/24/22 1551  Sloan Leiter, DO 11/25/22 [phone]

## 2022-11-25 ENCOUNTER — Observation Stay (HOSPITAL_COMMUNITY): Payer: Medicare Other

## 2022-11-25 DIAGNOSIS — E039 Hypothyroidism, unspecified: Secondary | ICD-10-CM | POA: Diagnosis present

## 2022-11-25 DIAGNOSIS — E872 Acidosis, unspecified: Secondary | ICD-10-CM | POA: Diagnosis present

## 2022-11-25 DIAGNOSIS — E860 Dehydration: Secondary | ICD-10-CM | POA: Diagnosis present

## 2022-11-25 DIAGNOSIS — R55 Syncope and collapse: Secondary | ICD-10-CM

## 2022-11-25 DIAGNOSIS — K219 Gastro-esophageal reflux disease without esophagitis: Secondary | ICD-10-CM | POA: Diagnosis not present

## 2022-11-25 DIAGNOSIS — N3000 Acute cystitis without hematuria: Secondary | ICD-10-CM | POA: Diagnosis not present

## 2022-11-25 DIAGNOSIS — W19XXXA Unspecified fall, initial encounter: Secondary | ICD-10-CM | POA: Diagnosis not present

## 2022-11-25 DIAGNOSIS — E871 Hypo-osmolality and hyponatremia: Secondary | ICD-10-CM | POA: Diagnosis present

## 2022-11-25 DIAGNOSIS — Z8616 Personal history of COVID-19: Secondary | ICD-10-CM | POA: Diagnosis not present

## 2022-11-25 DIAGNOSIS — N179 Acute kidney failure, unspecified: Secondary | ICD-10-CM | POA: Diagnosis present

## 2022-11-25 DIAGNOSIS — R413 Other amnesia: Secondary | ICD-10-CM | POA: Diagnosis present

## 2022-11-25 DIAGNOSIS — E876 Hypokalemia: Secondary | ICD-10-CM | POA: Diagnosis present

## 2022-11-25 DIAGNOSIS — W010XXA Fall on same level from slipping, tripping and stumbling without subsequent striking against object, initial encounter: Secondary | ICD-10-CM | POA: Diagnosis present

## 2022-11-25 DIAGNOSIS — Z7401 Bed confinement status: Secondary | ICD-10-CM | POA: Diagnosis not present

## 2022-11-25 DIAGNOSIS — Z9181 History of falling: Secondary | ICD-10-CM | POA: Diagnosis not present

## 2022-11-25 DIAGNOSIS — I10 Essential (primary) hypertension: Secondary | ICD-10-CM | POA: Diagnosis present

## 2022-11-25 DIAGNOSIS — E785 Hyperlipidemia, unspecified: Secondary | ICD-10-CM | POA: Diagnosis present

## 2022-11-25 DIAGNOSIS — E861 Hypovolemia: Secondary | ICD-10-CM | POA: Diagnosis present

## 2022-11-25 DIAGNOSIS — R8271 Bacteriuria: Secondary | ICD-10-CM | POA: Diagnosis present

## 2022-11-25 DIAGNOSIS — Z932 Ileostomy status: Secondary | ICD-10-CM | POA: Diagnosis not present

## 2022-11-25 DIAGNOSIS — Z0389 Encounter for observation for other suspected diseases and conditions ruled out: Secondary | ICD-10-CM | POA: Diagnosis not present

## 2022-11-25 DIAGNOSIS — Z79890 Hormone replacement therapy (postmenopausal): Secondary | ICD-10-CM | POA: Diagnosis not present

## 2022-11-25 DIAGNOSIS — K5190 Ulcerative colitis, unspecified, without complications: Secondary | ICD-10-CM | POA: Diagnosis present

## 2022-11-25 DIAGNOSIS — K55069 Acute infarction of intestine, part and extent unspecified: Secondary | ICD-10-CM | POA: Diagnosis present

## 2022-11-25 DIAGNOSIS — M810 Age-related osteoporosis without current pathological fracture: Secondary | ICD-10-CM | POA: Diagnosis present

## 2022-11-25 DIAGNOSIS — Z8249 Family history of ischemic heart disease and other diseases of the circulatory system: Secondary | ICD-10-CM | POA: Diagnosis not present

## 2022-11-25 DIAGNOSIS — N281 Cyst of kidney, acquired: Secondary | ICD-10-CM | POA: Diagnosis not present

## 2022-11-25 DIAGNOSIS — I4819 Other persistent atrial fibrillation: Secondary | ICD-10-CM | POA: Diagnosis present

## 2022-11-25 DIAGNOSIS — N390 Urinary tract infection, site not specified: Secondary | ICD-10-CM | POA: Diagnosis present

## 2022-11-25 DIAGNOSIS — R109 Unspecified abdominal pain: Secondary | ICD-10-CM | POA: Diagnosis not present

## 2022-11-25 DIAGNOSIS — E034 Atrophy of thyroid (acquired): Secondary | ICD-10-CM | POA: Diagnosis not present

## 2022-11-25 DIAGNOSIS — I480 Paroxysmal atrial fibrillation: Secondary | ICD-10-CM | POA: Diagnosis not present

## 2022-11-25 DIAGNOSIS — H8109 Meniere's disease, unspecified ear: Secondary | ICD-10-CM | POA: Diagnosis present

## 2022-11-25 DIAGNOSIS — Y92009 Unspecified place in unspecified non-institutional (private) residence as the place of occurrence of the external cause: Secondary | ICD-10-CM | POA: Diagnosis not present

## 2022-11-25 DIAGNOSIS — I499 Cardiac arrhythmia, unspecified: Secondary | ICD-10-CM | POA: Diagnosis not present

## 2022-11-25 DIAGNOSIS — K51818 Other ulcerative colitis with other complication: Secondary | ICD-10-CM | POA: Diagnosis not present

## 2022-11-25 LAB — URINALYSIS, W/ REFLEX TO CULTURE (INFECTION SUSPECTED)
Bilirubin Urine: NEGATIVE
Glucose, UA: NEGATIVE mg/dL
Hgb urine dipstick: NEGATIVE
Ketones, ur: NEGATIVE mg/dL
Nitrite: NEGATIVE
Protein, ur: 30 mg/dL — AB
Specific Gravity, Urine: 1.016 (ref 1.005–1.030)
pH: 5 (ref 5.0–8.0)

## 2022-11-25 LAB — BASIC METABOLIC PANEL
Anion gap: 14 (ref 5–15)
BUN: 97 mg/dL — ABNORMAL HIGH (ref 8–23)
CO2: 14 mmol/L — ABNORMAL LOW (ref 22–32)
Calcium: 7.9 mg/dL — ABNORMAL LOW (ref 8.9–10.3)
Chloride: 99 mmol/L (ref 98–111)
Creatinine, Ser: 3.64 mg/dL — ABNORMAL HIGH (ref 0.44–1.00)
GFR, Estimated: 12 mL/min — ABNORMAL LOW (ref >60)
Glucose, Bld: 105 mg/dL — ABNORMAL HIGH (ref 70–99)
Potassium: 3.6 mmol/L (ref 3.5–5.1)
Sodium: 127 mmol/L — ABNORMAL LOW (ref 135–145)

## 2022-11-25 LAB — ECHOCARDIOGRAM COMPLETE
AR max vel: 1.06 cm2
AV Area VTI: 0.93 cm2
AV Area mean vel: 0.93 cm2
AV Mean grad: 9 mmHg
AV Peak grad: 16.3 mmHg
Ao pk vel: 2.02 m/s
Area-P 1/2: 2.62 cm2
S' Lateral: 2.4 cm

## 2022-11-25 LAB — URINE CULTURE

## 2022-11-25 LAB — CBC
HCT: 43.8 % (ref 36.0–46.0)
Hemoglobin: 15.8 g/dL — ABNORMAL HIGH (ref 12.0–15.0)
MCH: 32.5 pg (ref 26.0–34.0)
MCHC: 36.1 g/dL — ABNORMAL HIGH (ref 30.0–36.0)
MCV: 90.1 fL (ref 80.0–100.0)
Platelets: 200 10*3/uL (ref 150–400)
RBC: 4.86 MIL/uL (ref 3.87–5.11)
RDW: 12.7 % (ref 11.5–15.5)
WBC: 6.3 10*3/uL (ref 4.0–10.5)
nRBC: 0 % (ref 0.0–0.2)

## 2022-11-25 LAB — TSH: TSH: 1.875 u[IU]/mL (ref 0.350–4.500)

## 2022-11-25 LAB — CREATININE, URINE, RANDOM: Creatinine, Urine: 181 mg/dL

## 2022-11-25 LAB — SODIUM, URINE, RANDOM: Sodium, Ur: <10 mmol/L

## 2022-11-25 MED ORDER — HYDRALAZINE HCL 20 MG/ML IJ SOLN: 5.0000 mg | Freq: Four times a day (QID) | INTRAMUSCULAR | Status: DC | PRN

## 2022-11-25 MED ORDER — STERILE WATER FOR INJECTION IV SOLN
INTRAVENOUS | Status: DC
  Filled 2022-11-25 (×4): qty 1000

## 2022-11-25 MED ORDER — SODIUM CHLORIDE 0.9 % IV SOLN
2.0000 g | INTRAVENOUS | Status: DC
  Administered 2022-11-25 – 2022-11-27 (×3): 2 g via INTRAVENOUS
  Filled 2022-11-25 (×3): qty 20

## 2022-11-25 NOTE — Progress Notes (Signed)
Attempted Echocardiogram, patient went to X-ray. Will try later.

## 2022-11-25 NOTE — Plan of Care (Signed)
Patient AOX3, disoriented to time, forgetful at times.  VSS throughout shift.  All meds given on time as ordered.  Diminished lungs, IS encouraged.  Ostomy bag emptied via pt several times.  Pt standby assist to bathroom.  PIV replaced, fluids on-going.  POC maintained, will continue to monitor.  Problem: Education: Goal: Knowledge of condition and prescribed therapy will improve Outcome: Progressing   Problem: Cardiac: Goal: Will achieve and/or maintain adequate cardiac output Outcome: Progressing   Problem: Physical Regulation: Goal: Complications related to the disease process, condition or treatment will be avoided or minimized Outcome: Progressing   Problem: Education: Goal: Knowledge of General Education information will improve Description: Including pain rating scale, medication(s)/side effects and non-pharmacologic comfort measures Outcome: Progressing   Problem: Health Behavior/Discharge Planning: Goal: Ability to manage health-related needs will improve Outcome: Progressing   Problem: Clinical Measurements: Goal: Ability to maintain clinical measurements within normal limits will improve Outcome: Progressing Goal: Will remain free from infection Outcome: Progressing Goal: Diagnostic test results will improve Outcome: Progressing Goal: Respiratory complications will improve Outcome: Progressing Goal: Cardiovascular complication will be avoided Outcome: Progressing   Problem: Activity: Goal: Risk for activity intolerance will decrease Outcome: Progressing   Problem: Nutrition: Goal: Adequate nutrition will be maintained Outcome: Progressing   Problem: Coping: Goal: Level of anxiety will decrease Outcome: Progressing   Problem: Elimination: Goal: Will not experience complications related to bowel motility Outcome: Progressing Goal: Will not experience complications related to urinary retention Outcome: Progressing   Problem: Pain Managment: Goal: General  experience of comfort will improve Outcome: Progressing   Problem: Safety: Goal: Ability to remain free from injury will improve Outcome: Progressing   Problem: Skin Integrity: Goal: Risk for impaired skin integrity will decrease Outcome: Progressing

## 2022-11-25 NOTE — Telephone Encounter (Signed)
Typically it is the PCP who fills out the form for assisted living. Sorry to hear about the stomach bug, I see she is in the hospital now. I hope she recovers quickly.

## 2022-11-25 NOTE — TOC Initial Note (Signed)
Transition of Care [facility]) - Initial/Assessment Note    Patient Details  Name: Jasmine Mccormick MRN: [id number] Date of Birth: 15-Aug-1934  Transition of Care [facility]) CM/SW Contact:    Erin Sons, LCSW Phone Number: 11/25/2022, 2:57 PM  Clinical Narrative:                  CSW notified by bedside RN that pt's niece wanting to discuss ALF. CSW called niece Gavin Pound who provides details of living situation and families goals. Pt lives alone and Gavin Pound and her husband are pt's primary care givers, checking on her daily. Gavin Pound states they are reaching the point where they can no longer provide level of care pt needs. In the longer term they plan to have pt transition to [facility] where Deborah's mom is currently located(they are already working on this). Gavin Pound is hoping pt can go to STR at SNF in the short term. Pt has been to [facility] in the past and that would be their preference. CSW explained current PT rec for [facility] though it is noted in rec that pt may need SNF. CSW explained 3 midnight medicare rule though [facility] is a facility on [facility] waiver list. Family could also private pay if necessary. CSW completed fl2 and faxed bed requests in hub.   Expected Discharge Plan: Skilled Nursing Facility [facility] vs SNF) Barriers to Discharge: Continued Medical Work up   Patient Goals and CMS Choice            Expected Discharge Plan and Services       Living arrangements for the past 2 months: Single Family Home                                      Prior Living Arrangements/Services Living arrangements for the past 2 months: Single Family Home Lives with:: Self Patient language and need for interpreter reviewed:: Yes        Need for Family Participation in Patient Care: Yes (Comment) Care giver support system in place?: Yes (comment)   Criminal Activity/Legal Involvement Pertinent to Current Situation/Hospitalization: No - Comment as needed  Activities of Daily  Living Home Assistive Devices/Equipment: Cane (specify quad or straight), Walker (specify type), Ostomy supplies (single point cane) ADL Screening (condition at time of admission) Patient's cognitive ability adequate to safely complete daily activities?: Yes Is the patient deaf or have difficulty hearing?: No Does the patient have difficulty seeing, even when wearing glasses/contacts?: No Does the patient have difficulty concentrating, remembering, or making decisions?: No Patient able to express need for assistance with ADLs?: Yes Does the patient have difficulty dressing or bathing?: No Independently performs ADLs?: Yes (appropriate for developmental age) Does the patient have difficulty walking or climbing stairs?: Yes Weakness of Legs: Both Weakness of Arms/Hands: None  Permission Sought/Granted   Permission granted to share information with : Yes, Verbal Permission Granted  Share Information with NAME: Antony Odea (Niece)  [phone] (Mobile)           Emotional Assessment         Alcohol / Substance Use: Not Applicable Psych Involvement: No (comment)  Admission diagnosis:  AKI (acute kidney injury) [N17.9] Fall, initial encounter [W19.XXXA] Patient Active Problem List   Diagnosis Date Noted   AKI (acute kidney injury) 11/24/2022   Near syncope 11/24/2022   Memory problem 04/11/2022   Vitamin D deficiency 04/11/2022   Secondary  hypercoagulable state 10/08/2021   Current use of long term anticoagulation 10/08/2021   Hematochezia 08/10/2021   Paroxysmal atrial fibrillation    GI bleeding 03/29/2021   Hypertensive urgency 03/29/2021   Acute GI bleeding 03/29/2021   Abdominal pain 03/29/2021   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   Grief 03/17/2021   Gait disorder 03/17/2021   Fracture of third lumbar vertebra 02/16/2021   UTI (urinary tract infection) 02/01/2021   COVID-19 12/22/2020   Body mass index (BMI) 26.0-26.9, adult 11/24/2020   Elevated  blood-pressure reading, without diagnosis of hypertension 11/24/2020   Wedge compression fracture of third lumbar vertebra, initial encounter for closed fracture 11/24/2020   Acute bilateral low back pain with left-sided sciatica 11/12/2020   Thigh shingles 04/02/2020   Acute right hip pain 04/02/2020   Dysuria 08/18/2017   Hearing loss of both ears due to cerumen impaction 09/03/2015   Callus of foot 09/03/2015   Low vitamin B12 level 09/03/2015   Right-sided face pain 08/21/2014   Well adult exam 07/04/2012   Hyperglycemia 07/04/2012   Hand dermatitis 06/22/2011   Hypothyroidism 12/07/2010   Actinic keratosis 05/04/2010   NEOPLASM, SKIN, UNCERTAIN BEHAVIOR 04/14/2009   CERUMEN IMPACTION 09/26/2007   RASH-NONVESICULAR 06/12/2007   Dyslipidemia 03/19/2007   MENIERE'S DISEASE 03/19/2007   Essential hypertension 03/19/2007   Ulcerative colitis 03/19/2007   Osteoporosis 03/19/2007   PCP:  Tresa Garter, MD Pharmacy:   [facility] [phone] [place], [place] - [address] [place] [address] Phone: [phone] Fax: [phone]  [facility] - [place], [place] - [address] [place] [address] Phone: [phone] Fax: [phone]  [facility] - [place], [place] - [address] [place] [address] Phone: [phone] Fax: [phone]  [facility] [address] [place], [place] - [address] [place] [address] Phone: [phone] Fax: [phone]     Social Determinants of Health (SDOH) Social History: SDOH Screenings   Food Insecurity: No Food Insecurity (11/24/2022)  Housing: Low Risk  (11/24/2022)  Transportation Needs: No Transportation Needs (11/24/2022)  Utilities: Not At Risk (11/24/2022)  Alcohol Screen: Low Risk  (06/15/2022)  Depression (PHQ2-9): Low Risk  (06/15/2022)  Financial Resource Strain: Low Risk   (06/15/2022)  Physical Activity: Sufficiently Active (06/15/2022)  Social Connections: Moderately Integrated (06/15/2022)  Stress: No Stress Concern Present (06/15/2022)  Tobacco Use: Low Risk  (11/24/2022)   SDOH Interventions:     Readmission Risk Interventions     No data to display

## 2022-11-25 NOTE — Progress Notes (Addendum)
PROGRESS NOTE    Jasmine Mccormick  UJW:[id number] DOB: August 15, 1934 DOA: 11/24/2022 PCP: Tresa Garter, MD    Chief Complaint  Patient presents with   Fall    Brief Narrative:  Patient is a 89 year old female history of ulcerative colitis, A-fib, mesenteric artery thrombosis, memory impairment, hypothyroidism, hypertension, hyperlipidemia presented to the ED after multiple falls at home.  Patient denies any syncopal episodes.  Patient noted to have had poor oral intake over the past week prior to admission.  No change in ostomy output.  Patient seen in the ED, CT head CT C-spine unremarkable.  Patient noted to be in acute kidney injury, noted to be orthostatic, and patient admitted for further evaluation and management.   Assessment & Plan:   Principal Problem:   AKI (acute kidney injury) Active Problems:   Dyslipidemia   Essential hypertension   Ulcerative colitis   Hypothyroidism   Mesenteric artery thrombosis   Paroxysmal atrial fibrillation   Memory problem   Near syncope   Metabolic acidosis  #1 AKI/near syncope -Patient presented after 2 falls at home, denies any significant syncopal episode. -It is noted per admitting physician that prior to patient's fall she felt lightheaded after standing up from using the bathroom and was walking out when she fell. -Likely secondary to orthostasis, as patient noted to be orthostatic on admission. -Creatinine on admission noted at 3.47 from a baseline of 1.1 in the setting of decreased oral intake. -Urinalysis ordered this morning cloudy, trace leukocytes, nitrite negative, few bacteria, 11-20 WBCs. -Urine sodium ordered this morning noted at < 10. -Renal ultrasound ordered this morning negative for hydronephrosis. -Patient noted to be acidotic likely secondary to AKI, as such we will change IV fluids and placed on a bicarb drip at 125 cc an hour. -Monitor urine output. -Worsening creatinine. -Repeat labs in the AM.  2.   Metabolic acidosis -Likely secondary to acute kidney injury. -Patient currently afebrile.  Normal WBCs. -Change IV fluids to bicarb drip. -Urinalysis concerning for UTI and as such we will place on empiric IV antibiotics. -Follow.  3.  Paroxysmal atrial fibrillation -Currently normal sinus rhythm. -Continue amiodarone, Toprol-XL noted to recently be reduced due to bradycardia. -Eliquis for anticoagulation.  4.  Probable UTI -Urinalysis concerning for UTI. -Urine cultures pending. -Start IV Rocephin.  5.  Hypothyroidism -TSH at 1.875.   -Continue home regimen Synthroid.  6.  Hyperlipidemia -Continue simvastatin, Zetia.  7.  History of ulcerative colitis -Status post remote total colostomy with end ileostomy. -Outpatient follow-up with GI.  8.  History of mesenteric artery thrombosis -Continue Eliquis.  9.  Hypertension -Continue Toprol-XL. -IV hydralazine as needed  10.  Hyponatremia -Likely secondary to hypovolemic hyponatremia. -Urine Sodium < 10. -IV fluids.    DVT prophylaxis: Eliquis Code Status: Full Family Communication: Updated patient.  No family at bedside. Disposition: TBD  Status is: Inpatient The patient will require care spanning > 2 midnights and should be moved to inpatient because: Severity of illness   Consultants:  None  Procedures:  CT head CT C-spine 11/24/2022 Chest x-ray 11/25/2022 Abdominal films 11/25/2022 Renal ultrasound 11/25/2022 2D echo 11/25/2022  Antimicrobials:  IV Rocephin 11/25/2022>>>>   Subjective: Patient laying in bed.  States she needs to empty out her ostomy bag.  Per RN patient having urine output.  Patient states had decreased oral intake over the past week.  Patient denies any syncopal episodes.  Stated she got up from the bathroom and slipped and fell and was able  to crawl to the telephone to call her family.  Denies any chest pain.  No shortness of breath.  Denies any significant abdominal  pain.  Objective: Vitals:   11/24/22 1937 11/25/22 0538 11/25/22 0853 11/25/22 1622  BP: (!) 143/80 (!) 160/77 (!) 138/92 (!) 189/75  Pulse: 70 66 62 (!) 59  Resp: 18 16 15    Temp: 98.2 F (36.8 C) 98.1 F (36.7 C) (!) 97.4 F (36.3 C) 97.9 F (36.6 C)  TempSrc: Oral     SpO2: 96% 100%  96%    Intake/Output Summary (Last 24 hours) at 11/25/2022 1744 Last data filed at 11/25/2022 1500 Gross per 24 hour  Intake 854 ml  Output 240 ml  Net 614 ml   There were no vitals filed for this visit.  Examination:  General exam: Appears calm and comfortable  Respiratory system: Clear to auscultation anterior lung fields. Respiratory effort normal. Cardiovascular system: S1 & S2 heard, RRR. No JVD, murmurs, rubs, gallops or clicks. No pedal edema. Gastrointestinal system: Abdomen is nondistended, soft and nontender. No organomegaly or masses felt. Normal bowel sounds heard. Central nervous system: Alert and oriented. No focal neurological deficits. Extremities: Symmetric 5 x 5 power. Skin: No rashes, lesions or ulcers Psychiatry: Judgement and insight appear normal. Mood & affect appropriate.     Data Reviewed: I have personally reviewed following labs and imaging studies  CBC: Recent Labs  Lab 11/24/22 1242 11/25/22 0503  WBC 7.8 6.3  NEUTROABS 6.4  --   HGB 16.1* 15.8*  HCT 47.7* 43.8  MCV 94.6 90.1  PLT 190 200    Basic Metabolic Panel: Recent Labs  Lab 11/24/22 1242 11/25/22 0503  NA 128* 127*  K 4.1 3.6  CL 91* 99  CO2 19* 14*  GLUCOSE 115* 105*  BUN 81* 97*  CREATININE 3.47* 3.64*  CALCIUM 8.9 7.9*    GFR: CrCl cannot be calculated (Unknown ideal weight.).  Liver Function Tests: No results for input(s): "AST", "ALT", "ALKPHOS", "BILITOT", "PROT", "ALBUMIN" in the last 168 hours.  CBG: No results for input(s): "GLUCAP" in the last 168 hours.   Recent Results (from the past 240 hour(s))  Urine Culture     Status: None (Preliminary result)    Collection Time: 11/25/22  9:15 AM   Specimen: Urine, Catheterized  Result Value Ref Range Status   Specimen Description URINE, CATHETERIZED  Final   Special Requests   Final    NONE Reflexed from [id number] Performed at [facility], [address], [place], [place] [address]    Culture PENDING  Incomplete   Report Status PENDING  Incomplete         Radiology Studies: ECHOCARDIOGRAM COMPLETE  Result Date: 11/25/2022    ECHOCARDIOGRAM REPORT   Patient Name:   WAYNESHA BESCH [place] Date of Exam: 11/25/2022 Medical Rec #:  [id number]          Height:       62.5 in Accession #:    [id number]         Weight:       142.1 lb Date of Birth:  Jan 18, 1934         BSA:          1.663 m Patient Age:    88 years           BP:           160/77 mmHg Patient Gender: F  HR:           57 bpm. Exam Location:  Inpatient Procedure: 2D Echo, Cardiac Doppler and Color Doppler Indications:    Syncope R55  History:        Patient has prior history of Echocardiogram examinations, most                 recent 03/30/2021. Arrythmias:Atrial Fibrillation,                 Signs/Symptoms:Syncope; Risk Factors:Hypertension and                 Dyslipidemia.  Sonographer:    Lucendia Herrlich Referring Phys: [id number] Lyn Hollingshead B MELVIN IMPRESSIONS  1. Left ventricular ejection fraction, by estimation, is 60 to 65%. The left ventricle has normal function. The left ventricle has no regional wall motion abnormalities. There is mild left ventricular hypertrophy. Left ventricular diastolic parameters are consistent with Grade I diastolic dysfunction (impaired relaxation).  2. Right ventricular systolic function is normal. The right ventricular size is normal. There is normal pulmonary artery systolic pressure. The estimated right ventricular systolic pressure is 28.4 mmHg.  3. Left atrial size was moderately dilated.  4. Right atrial size was mildly dilated.  5. The mitral valve is degenerative. Mild mitral valve regurgitation.   6. Aortic valve regurgitation is not visualized. Aortic valve sclerosis/calcification is present, without any evidence of aortic stenosis.  7. The inferior vena cava is normal in size with greater than 50% respiratory variability, suggesting right atrial pressure of 3 mmHg. FINDINGS  Left Ventricle: Left ventricular ejection fraction, by estimation, is 60 to 65%. The left ventricle has normal function. The left ventricle has no regional wall motion abnormalities. The left ventricular internal cavity size was normal in size. There is  mild left ventricular hypertrophy. Left ventricular diastolic parameters are consistent with Grade I diastolic dysfunction (impaired relaxation). Right Ventricle: The right ventricular size is normal. Right ventricular systolic function is normal. There is normal pulmonary artery systolic pressure. The tricuspid regurgitant velocity is 2.52 m/s, and with an assumed right atrial pressure of 3 mmHg,  the estimated right ventricular systolic pressure is 28.4 mmHg. Left Atrium: Left atrial size was moderately dilated. Right Atrium: Right atrial size was mildly dilated. Pericardium: There is no evidence of pericardial effusion. Mitral Valve: The mitral valve is degenerative in appearance. There is moderate calcification of the mitral valve leaflet(s). Mild mitral valve regurgitation. Tricuspid Valve: Tricuspid valve regurgitation is mild. Aortic Valve: Aortic valve regurgitation is not visualized. Aortic valve sclerosis/calcification is present, without any evidence of aortic stenosis. Aortic valve mean gradient measures 9.0 mmHg. Aortic valve peak gradient measures 16.3 mmHg. Aortic valve area, by VTI measures 0.93 cm. Pulmonic Valve: Pulmonic valve regurgitation is not visualized. Aorta: The aortic root and ascending aorta are structurally normal, with no evidence of dilitation. Venous: The inferior vena cava is normal in size with greater than 50% respiratory variability, suggesting  right atrial pressure of 3 mmHg. IAS/Shunts: No atrial level shunt detected by color flow Doppler.  LEFT VENTRICLE PLAX 2D LVIDd:         3.50 cm   Diastology LVIDs:         2.40 cm   LV e' medial:    4.58 cm/s LV PW:         1.30 cm   LV E/e' medial:  14.0 LV IVS:        0.90 cm   LV e' lateral:   4.28  cm/s LVOT diam:     1.80 cm   LV E/e' lateral: 15.0 LV SV:         43 LV SV Index:   26 LVOT Area:     2.54 cm  RIGHT VENTRICLE             IVC RV S prime:     14.40 cm/s  IVC diam: 1.90 cm TAPSE (M-mode): 1.5 cm LEFT ATRIUM             Index        RIGHT ATRIUM           Index LA diam:        4.40 cm 2.65 cm/m   RA Area:     15.90 cm LA Vol (A2C):   53.2 ml 31.96 ml/m  RA Volume:   39.50 ml  23.75 ml/m LA Vol (A4C):   44.5 ml 26.76 ml/m LA Biplane Vol: 53.0 ml 31.87 ml/m  AORTIC VALVE AV Area (Vmax):    1.06 cm AV Area (Vmean):   0.93 cm AV Area (VTI):     0.93 cm AV Vmax:           202.00 cm/s AV Vmean:          144.000 cm/s AV VTI:            0.465 m AV Peak Grad:      16.3 mmHg AV Mean Grad:      9.0 mmHg LVOT Vmax:         84.20 cm/s LVOT Vmean:        52.767 cm/s LVOT VTI:          0.171 m LVOT/AV VTI ratio: 0.37  AORTA Ao Root diam: 2.90 cm Ao Asc diam:  2.90 cm MITRAL VALVE               TRICUSPID VALVE MV Area (PHT): 2.62 cm    TR Peak grad:   25.4 mmHg MV Decel Time: 290 msec    TR Vmax:        252.00 cm/s MV E velocity: 64.00 cm/s MV A velocity: 73.80 cm/s  SHUNTS MV E/A ratio:  0.87        Systemic VTI:  0.17 m                            Systemic Diam: 1.80 cm Carolan Clines Electronically signed by Carolan Clines Signature Date/Time: 11/25/2022/3:33:35 PM    Final    DG Abd 2 Views  Result Date: 11/25/2022 CLINICAL DATA:  Pain EXAM: ABDOMEN - 2 VIEW COMPARISON:  None Available. FINDINGS: The bowel gas pattern is normal. There is no evidence of free air. No radio-opaque calculi or other significant radiographic abnormality is seen. IMPRESSION: Negative. Electronically Signed   By: Layla Maw  M.D.   On: 11/25/2022 09:53   DG Chest 2 View  Result Date: 11/25/2022 CLINICAL DATA:  2672 Acidosis 2672 EXAM: CHEST - 2 VIEW COMPARISON:  03/29/2021. FINDINGS: The heart size and mediastinal contours are within normal limits. Both lungs are clear. No pneumothorax or pleural effusion. There are thoracic degenerative changes. IMPRESSION: No active cardiopulmonary disease. Electronically Signed   By: Layla Maw M.D.   On: 11/25/2022 09:52   US RENAL  Result Date: 11/25/2022 CLINICAL DATA:  [id number] ARF (acute renal failure) [id number] EXAM: RENAL / URINARY TRACT ULTRASOUND COMPLETE COMPARISON:  None Available. FINDINGS: The right  kidney measured 10.6 cm and the left kidney measured 9.7 cm. The kidneys demonstrate normal echogenicity. Left kidney cysts identified measuring 1.8 cm this is not need imaging follow up. No shadowing stones are seen. No hydronephrosis. The urinary bladder appeared unremarkable. Bladder volumes were not measured and no postvoid images are submitted. IMPRESSION: Left kidney cyst. Otherwise unremarkable study which was technologically limited. Electronically Signed   By: Layla Maw M.D.   On: 11/25/2022 09:47   CT Head Wo Contrast  Result Date: 11/24/2022 CLINICAL DATA:  Neck trauma (Age >= 65y); Head trauma, minor (Age >= 65y) EXAM: CT HEAD WITHOUT CONTRAST CT CERVICAL SPINE WITHOUT CONTRAST TECHNIQUE: Multidetector CT imaging of the head and cervical spine was performed following the standard protocol without intravenous contrast. Multiplanar CT image reconstructions of the cervical spine were also generated. RADIATION DOSE REDUCTION: This exam was performed according to the departmental dose-optimization program which includes automated exposure control, adjustment of the mA and/or kV according to patient size and/or use of iterative reconstruction technique. COMPARISON:  None Available. FINDINGS: CT HEAD FINDINGS Brain: No evidence of acute infarction, hemorrhage,  hydrocephalus, extra-axial collection or mass lesion/mass effect. Vascular: No hyperdense vessel.  Calcific atherosclerosis. Skull: No acute fracture. Sinuses/Orbits: No acute finding. Other: Postsurgical changes of right mastoidectomy. No mastoid effusions. CT CERVICAL SPINE FINDINGS Alignment: No substantial sagittal subluxation. Skull base and vertebrae: Vertebral body heights are maintained. No evidence of acute fracture. Osteopenia. Soft tissues and spinal canal: No prevertebral fluid or swelling. No visible canal hematoma. Disc levels:  Multilevel facet and uncovertebral hypertrophy. Upper chest: Visualized lung apices are clear. IMPRESSION: 1. No evidence of acute intracranial abnormality. 2. No evidence of acute fracture or traumatic malalignment. Electronically Signed   By: Feliberto Harts M.D.   On: 11/24/2022 12:05   CT Cervical Spine Wo Contrast  Result Date: 11/24/2022 CLINICAL DATA:  Neck trauma (Age >= 65y); Head trauma, minor (Age >= 65y) EXAM: CT HEAD WITHOUT CONTRAST CT CERVICAL SPINE WITHOUT CONTRAST TECHNIQUE: Multidetector CT imaging of the head and cervical spine was performed following the standard protocol without intravenous contrast. Multiplanar CT image reconstructions of the cervical spine were also generated. RADIATION DOSE REDUCTION: This exam was performed according to the departmental dose-optimization program which includes automated exposure control, adjustment of the mA and/or kV according to patient size and/or use of iterative reconstruction technique. COMPARISON:  None Available. FINDINGS: CT HEAD FINDINGS Brain: No evidence of acute infarction, hemorrhage, hydrocephalus, extra-axial collection or mass lesion/mass effect. Vascular: No hyperdense vessel.  Calcific atherosclerosis. Skull: No acute fracture. Sinuses/Orbits: No acute finding. Other: Postsurgical changes of right mastoidectomy. No mastoid effusions. CT CERVICAL SPINE FINDINGS Alignment: No substantial sagittal  subluxation. Skull base and vertebrae: Vertebral body heights are maintained. No evidence of acute fracture. Osteopenia. Soft tissues and spinal canal: No prevertebral fluid or swelling. No visible canal hematoma. Disc levels:  Multilevel facet and uncovertebral hypertrophy. Upper chest: Visualized lung apices are clear. IMPRESSION: 1. No evidence of acute intracranial abnormality. 2. No evidence of acute fracture or traumatic malalignment. Electronically Signed   By: Feliberto Harts M.D.   On: 11/24/2022 12:05        Scheduled Meds:  amiodarone  200 mg Oral Daily   apixaban  2.5 mg Oral BID   ezetimibe  10 mg Oral Daily   levothyroxine  75 mcg Oral Daily   metoprolol succinate  25 mg Oral Daily   pantoprazole  40 mg Oral Daily   simvastatin  20 mg  Oral q1800   sodium chloride flush  3 mL Intravenous Q12H   Continuous Infusions:  cefTRIAXone (ROCEPHIN)  IV     sodium bicarbonate 150 mEq in sterile water 1,150 mL infusion 125 mL/hr at 11/25/22 1120     LOS: 0 days    Time spent: 40 minutes    Ramiro Harvest, MD [facility]   To contact the attending provider between 7A-7P or the covering provider during after hours 7P-7A, please log into the web site [email or web address] and access using universal [place] password for that web site. If you do not have the password, please call the hospital operator.  11/25/2022, 5:44 PM

## 2022-11-25 NOTE — Progress Notes (Signed)
Second attempt for Echocardiogram, spoke with RN she said try back later in about a hour.

## 2022-11-25 NOTE — Progress Notes (Signed)
Physical Therapy Treatment Patient Details Name: Jasmine Mccormick MRN: [id number] DOB: 09-03-1933 Today's Date: 11/25/2022   History of Present Illness Pt is an 89 y.o. female who presented 11/24/22 s/p fall at home with near syncopal/syncopal episode. Per chart, family endorses pt had not been eating or drinking fluids as usual. PMH: HTN, HLD, hypothyroidism, ileostomy, memory loss, Meniere disease, osteoporosis, atrial fibrillation    PT Comments    Pt with no reports of dizziness during mobility today, just fatigue. Pt tolerated mobility to/from bathroom only with close guard for safety, limited by fatigue and concern for IV site swelling. Pt also required very increased time for toileting and ostomy management. Pt may need to consider post-acute rehab given current functional status, tolerance, and  given pt lives alone. PT to continue to follow.     Recommendations for follow up therapy are one component of a multi-disciplinary discharge planning process, led by the attending physician.  Recommendations may be updated based on patient status, additional functional criteria and insurance authorization.  Follow Up Recommendations  Can patient physically be transported by private vehicle: Yes    Assistance Recommended at Discharge Intermittent Supervision/Assistance  Patient can return home with the following A little help with walking and/or transfers;A little help with bathing/dressing/bathroom;Assistance with cooking/housework;Direct supervision/assist for medications management;Direct supervision/assist for financial management;Assist for transportation;Help with stairs or ramp for entrance   Equipment Recommendations  Rollator (4 wheels);BSC/3in1    Recommendations for Other Services       Precautions / Restrictions Precautions Precautions: Fall Precaution Comments: watch BP (orthostatic 4/11), ileostomy     Mobility  Bed Mobility Overal bed mobility: Needs  Assistance Bed Mobility: Sit to Supine, Supine to Sit     Supine to sit: Supervision, HOB elevated Sit to supine: Supervision, HOB elevated        Transfers Overall transfer level: Needs assistance Equipment used: Rolling walker (2 wheels) Transfers: Sit to/from Stand Sit to Stand: Min guard           General transfer comment: for safety, slow to rise. stand x2, from EOB and toilet.    Ambulation/Gait Ambulation/Gait assistance: Min guard Gait Distance (Feet): 10 Feet (x2 - to and from toilet) Assistive device: Rolling walker (2 wheels) Gait Pattern/deviations: Step-through pattern, Decreased stride length, Leaning posteriorly Gait velocity: decr     General Gait Details: close gaurd for safety, cues for placement in RW and upright posture.   Stairs             Wheelchair Mobility    Modified Rankin (Stroke Patients Only)       Balance Overall balance assessment: Needs assistance Sitting-balance support: Bilateral upper extremity supported, Single extremity supported, Feet supported Sitting balance-Leahy Scale: Fair   Postural control: Posterior lean Standing balance support: Bilateral upper extremity supported, During functional activity, Single extremity supported Standing balance-Leahy Scale: Poor                              Cognition Arousal/Alertness: Awake/alert Behavior During Therapy: WFL for tasks assessed/performed Overall Cognitive Status: History of cognitive impairments - at baseline                                 General Comments: STM deficits at baseline, methodical with ileostomy care and requires very increased time        Exercises  General Comments        Pertinent Vitals/Pain Pain Assessment Pain Assessment: Faces Faces Pain Scale: Hurts a little bit Pain Location: back Pain Descriptors / Indicators: Aching Pain Intervention(s): Limited activity within patient's tolerance, Monitored  during session, Repositioned    Home Living Family/patient expects to be discharged to:: Private residence Living Arrangements: Alone Available Help at Discharge: Family;Available PRN/intermittently Type of Home: House Home Access: Stairs to enter Entrance Stairs-Rails: Can reach both Entrance Stairs-Number of Steps: 4   Home Layout: One level Home Equipment: Cane - single point;Shower seat      Prior Function            PT Goals (current goals can now be found in the care plan section) Acute Rehab PT Goals Patient Stated Goal: to improve PT Goal Formulation: With patient/family Time For Goal Achievement: 12/08/22 Potential to Achieve Goals: Good Progress towards PT goals: Progressing toward goals    Frequency    Min 3X/week      PT Plan Discharge plan needs to be updated    Co-evaluation              AM-PAC PT "6 Clicks" Mobility   Outcome Measure  Help needed turning from your back to your side while in a flat bed without using bedrails?: A Little Help needed moving from lying on your back to sitting on the side of a flat bed without using bedrails?: A Little Help needed moving to and from a bed to a chair (including a wheelchair)?: A Little Help needed standing up from a chair using your arms (e.g., wheelchair or bedside chair)?: A Little Help needed to walk in hospital room?: A Lot Help needed climbing 3-5 steps with a railing? : A Lot 6 Click Score: 16    End of Session   Activity Tolerance: Patient limited by fatigue;Patient tolerated treatment well Patient left: in bed;with call bell/phone within reach;with bed alarm set Nurse Communication: Mobility status;Other (comment) (RN called from call bell to notify of IV site swelling) PT Visit Diagnosis: Unsteadiness on feet (R26.81);Other abnormalities of gait and mobility (R26.89);Muscle weakness (generalized) (M62.81);History of falling (Z91.81);Repeated falls (R29.6);Difficulty in walking, not  elsewhere classified (R26.2)     Time: 5784-6962 PT Time Calculation (min) (ACUTE ONLY): 28 min  Charges:  $Therapeutic Activity: 8-22 mins                    Marye Round, PT DPT Acute Rehabilitation Services Pager [phone]  Office [phone]    Tyrone Apple E Christain [place] 11/25/2022, 5:03 PM

## 2022-11-25 NOTE — [place] FL2 (Signed)
[place] MEDICAID [id number] LEVEL OF CARE FORM     IDENTIFICATION  Patient Name: Jasmine Mccormick Birthdate: 05-May-1934 Sex: female Admission Date (Current Location): 11/24/2022  [facility] and [place] Number:  Producer, television/film/video and Address:  [facility] [place], [address], [place], [place] [address]      Provider Number: [id number]  Attending Physician Name and Address:  Rodolph Bong, MD  Relative Name and Phone Number:  Antony Odea (Niece)  [phone] [facility])    Current Level of Care: Hospital Recommended Level of Care: Skilled Nursing Facility Prior Approval Number:    Date Approved/Denied:   PASRR Number: [id number]  Discharge Plan: SNF    Current Diagnoses: Patient Active Problem List   Diagnosis Date Noted   AKI (acute kidney injury) 11/24/2022   Near syncope 11/24/2022   Memory problem 04/11/2022   Vitamin D deficiency 04/11/2022   Secondary hypercoagulable state 10/08/2021   Current use of long term anticoagulation 10/08/2021   Hematochezia 08/10/2021   Paroxysmal atrial fibrillation    GI bleeding 03/29/2021   Hypertensive urgency 03/29/2021   Acute GI bleeding 03/29/2021   Abdominal pain 03/29/2021   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   Grief 03/17/2021   Gait disorder 03/17/2021   Fracture of third lumbar vertebra 02/16/2021   UTI (urinary tract infection) 02/01/2021   COVID-19 12/22/2020   Body mass index (BMI) 26.0-26.9, adult 11/24/2020   Elevated blood-pressure reading, without diagnosis of hypertension 11/24/2020   Wedge compression fracture of third lumbar vertebra, initial encounter for closed fracture 11/24/2020   Acute bilateral low back pain with left-sided sciatica 11/12/2020   Thigh shingles 04/02/2020   Acute right hip pain 04/02/2020   Dysuria 08/18/2017   Hearing loss of both ears due to cerumen impaction 09/03/2015   Callus of foot 09/03/2015   Low vitamin B12 level 09/03/2015   Right-sided face  pain 08/21/2014   Well adult exam 07/04/2012   Hyperglycemia 07/04/2012   Hand dermatitis 06/22/2011   Hypothyroidism 12/07/2010   Actinic keratosis 05/04/2010   NEOPLASM, SKIN, UNCERTAIN BEHAVIOR 04/14/2009   CERUMEN IMPACTION 09/26/2007   RASH-NONVESICULAR 06/12/2007   Dyslipidemia 03/19/2007   MENIERE'S DISEASE 03/19/2007   Essential hypertension 03/19/2007   Ulcerative colitis 03/19/2007   Osteoporosis 03/19/2007    Orientation RESPIRATION BLADDER Height & Weight     Self, Situation, Place  Normal Continent Weight:   Height:     BEHAVIORAL SYMPTOMS/MOOD NEUROLOGICAL BOWEL NUTRITION STATUS      Ileostomy Diet (See d/c summary)  AMBULATORY STATUS COMMUNICATION OF NEEDS Skin   Extensive Assist Verbally Normal                       Personal Care Assistance Level of Assistance  Bathing, Feeding, Dressing Bathing Assistance: Limited assistance Feeding assistance: Independent Dressing Assistance: Limited assistance     Functional Limitations Info  Sight, Hearing, Speech Sight Info: Impaired Hearing Info: Adequate Speech Info: Adequate    SPECIAL CARE FACTORS FREQUENCY  OT (By licensed OT), PT (By licensed PT)     PT Frequency: 5x/week OT Frequency: 5x/week            Contractures Contractures Info: Not present    Additional Factors Info  Code Status, Allergies Code Status Info: Full code Allergies Info: no known allergies           Current Medications (11/25/2022):  This is the current hospital active medication list Current Facility-Administered Medications  Medication Dose Route  Frequency Provider Last Rate Last Admin   acetaminophen (TYLENOL) tablet 650 mg  650 mg Oral Q6H PRN Synetta Fail, MD   650 mg at 11/24/22 2137   Or   acetaminophen (TYLENOL) suppository 650 mg  650 mg Rectal Q6H PRN Synetta Fail, MD       amiodarone (PACERONE) tablet 200 mg  200 mg Oral Daily Synetta Fail, MD   200 mg at 11/25/22 1049   apixaban  (ELIQUIS) tablet 2.5 mg  2.5 mg Oral BID Synetta Fail, MD   2.5 mg at 11/25/22 1049   ezetimibe (ZETIA) tablet 10 mg  10 mg Oral Daily Synetta Fail, MD   10 mg at 11/25/22 1049   levothyroxine (SYNTHROID) tablet 75 mcg  75 mcg Oral Daily Synetta Fail, MD   75 mcg at 11/25/22 0602   metoprolol succinate (TOPROL-XL) 24 hr tablet 25 mg  25 mg Oral Daily Synetta Fail, MD   25 mg at 11/25/22 1047   pantoprazole (PROTONIX) EC tablet 40 mg  40 mg Oral Daily Synetta Fail, MD   40 mg at 11/25/22 1049   polyethylene glycol (MIRALAX / GLYCOLAX) packet 17 g  17 g Oral Daily PRN Synetta Fail, MD       simvastatin (ZOCOR) tablet 20 mg  20 mg Oral q1800 Synetta Fail, MD   20 mg at 11/24/22 2136   sodium bicarbonate 150 mEq in sterile water 1,150 mL infusion   Intravenous Continuous Rodolph Bong, MD 125 mL/hr at 11/25/22 1120 New Bag at 11/25/22 1120   sodium chloride flush (NS) 0.9 % injection 3 mL  3 mL Intravenous Q12H Synetta Fail, MD   3 mL at 11/25/22 1122     Discharge Medications: Please see discharge summary for a list of discharge medications.  Relevant Imaging Results:  Relevant Lab Results:   Additional Information SSN: [id number]  Erin Sons, LCSW

## 2022-11-25 NOTE — Evaluation (Signed)
Occupational Therapy Evaluation Patient Details Name: Jasmine Mccormick MRN: [id number] DOB: 02-May-1934 Today's Date: 11/25/2022   History of Present Illness Pt is an 89 y.o. female who presented 11/24/22 s/p fall at home with near syncopal/syncopal episode. Per chart, family endorses pt had not been eating or drinking fluids as usual. PMH: HTN, HLD, hypothyroidism, ileostomy, memory loss, Meniere disease, osteoporosis, atrial fibrillation   Clinical Impression   Pt presents with decline in function and safety with ADLs and ADL mobility with impaired strength, balance and endurance; pt with hx of cognitive impairments and falls. PTA,  pt was Ind with ADLs/selfcare, toileting. STM deficits at baseline; family sets up her pill box and manages her finances, transportation, grocery shopping; pt does very light cooking or warms up microwavable food, otherwise family provides. She was mod I using a SPC for functional mobility with a hx of several recent falls. Pt lives alone and her nieces live nearby and check on her daily. Anticipated pt to be able to go home upon d/c with [facility] therapies, however, may need SNF rehab if she does not progress as anticipated. Pt would benefit from acute OT services to address impairments to maximize level of function and safety.     Recommendations for follow up therapy are one component of a multi-disciplinary discharge planning process, led by the attending physician.  Recommendations may be updated based on patient status, additional functional criteria and insurance authorization.   Assistance Recommended at Discharge    Patient can return home with the following A lot of help with bathing/dressing/bathroom;A little help with walking and/or transfers;Assistance with cooking/housework;Direct supervision/assist for financial management;Help with stairs or ramp for entrance;Assist for transportation;Direct supervision/assist for medications management    Functional Status  Assessment  Patient has had a recent decline in their functional status and demonstrates the ability to make significant improvements in function in a reasonable and predictable amount of time.  Equipment Recommendations  BSC/3in1    Recommendations for Other Services       Precautions / Restrictions Precautions Precautions: Fall Precaution Comments: watch BP (orthostatic 4/11), ileostomy Restrictions Weight Bearing Restrictions: No      Mobility Bed Mobility               General bed mobility comments: pt sitting EOB upon arrival    Transfers Overall transfer level: Needs assistance Equipment used: Rolling walker (2 wheels), 1 person hand held assist Transfers: Sit to/from Stand Sit to Stand: Min assist           General transfer comment: Pt leans posteriorly and displays some shakiness with standing, reports no dizziness or nausea once standing      Balance Overall balance assessment: Needs assistance Sitting-balance support: Bilateral upper extremity supported, Single extremity supported, Feet supported Sitting balance-Leahy Scale: Fair Sitting balance - Comments: pt sitting EOB finishing meal, posterior leaning, declines sitting up in recliner Postural control: Posterior lean Standing balance support: Bilateral upper extremity supported, During functional activity, Single extremity supported Standing balance-Leahy Scale: Poor                             ADL either performed or assessed with clinical judgement   ADL Overall ADL's : Needs assistance/impaired Eating/Feeding: Independent;Sitting   Grooming: Wash/dry face;Wash/dry hands;Standing;Min guard   Upper Body Bathing: Min guard;Sitting   Lower Body Bathing: Moderate assistance   Upper Body Dressing : Min guard;Sitting   Lower Body Dressing: Moderate assistance  Toilet Transfer: Minimal assistance;Ambulation;Rolling walker (2 wheels);Cueing for safety;Regular Toilet;Grab bars    Toileting- Clothing Manipulation and Hygiene: Moderate assistance;Sit to/from stand       Functional mobility during ADLs: Minimal assistance;Rolling walker (2 wheels);Cueing for safety General ADL Comments: assist due to impaired balance, unsteady     Vision Baseline Vision/History: 1 Wears glasses Ability to See in Adequate Light: 0 Adequate Patient Visual Report: No change from baseline       Perception     Praxis      Pertinent Vitals/Pain Pain Assessment Pain Assessment: 0-10 Pain Score: 4  Pain Location: back Pain Descriptors / Indicators: Aching Pain Intervention(s): Monitored during session, Repositioned     Hand Dominance Right   Extremity/Trunk Assessment Upper Extremity Assessment Upper Extremity Assessment: Generalized weakness   Lower Extremity Assessment Lower Extremity Assessment: Defer to PT evaluation   Cervical / Trunk Assessment Cervical / Trunk Assessment: Kyphotic   Communication Communication Communication: HOH   Cognition Arousal/Alertness: Awake/alert Behavior During Therapy: WFL for tasks assessed/performed                                   General Comments: STM deficits at baseline per family. Pt asking same questions often, unsure if this is baseline     General Comments       Exercises     Shoulder Instructions      Home Living Family/patient expects to be discharged to:: Private residence Living Arrangements: Alone Available Help at Discharge: Family;Available PRN/intermittently Type of Home: House Home Access: Stairs to enter [employer] of Steps: 4 Entrance Stairs-Rails: Can reach both Home Layout: One level     Bathroom Shower/Tub: Producer, television/film/video: Standard Bathroom Accessibility: No   Home Equipment: Cane - single point;Shower seat          Prior Functioning/Environment Prior Level of Function : Needs assist  Cognitive Assist : ADLs (cognitive)   ADLs  (Cognitive): Set up cues Physical Assist : ADLs (physical)   ADLs (physical): IADLs Mobility Comments: Mod I using SPC, x2 falls in past 2 days and a few others recently ADLs Comments: Nieces report some STM deficits at baseline; family sets up her pill box and manages her finances; pt provides transportation. Pt does very light cooking or warms up microwavable food, otherwise family does shopping and majority of cooking; housekeeper comes to do laundry and clean every other week        OT Problem List: Decreased activity tolerance;Decreased strength;Decreased cognition;Decreased knowledge of use of DME or AE;Decreased safety awareness;Impaired balance (sitting and/or standing)      OT Treatment/Interventions: Self-care/ADL training;Balance training;Therapeutic exercise;DME and/or AE instruction;Therapeutic activities;Patient/family education    OT Goals(Current goals can be found in the care plan section) Acute Rehab OT Goals Patient Stated Goal: go home OT Goal Formulation: With patient Time For Goal Achievement: 12/09/22 Potential to Achieve Goals: Good ADL Goals Pt Will Perform Grooming: with supervision;with set-up;standing Pt Will Perform Upper Body Bathing: with supervision;with set-up;sitting Pt Will Perform Lower Body Bathing: with min assist;with min guard assist Pt Will Perform Upper Body Dressing: with supervision;with set-up;sitting Pt Will Perform Lower Body Dressing: with min assist;with min guard assist Pt Will Transfer to Toilet: with min guard assist;ambulating;grab bars Pt Will Perform Toileting - Clothing Manipulation and hygiene: with min assist;with min guard assist;sit to/from stand Pt Will Perform Tub/Shower Transfer: with min assist;with min guard assist;ambulating;rolling walker;shower  seat;grab bars  OT Frequency: Min 2X/week    Co-evaluation              AM-PAC OT "6 Clicks" Daily Activity     Outcome Measure Help from another person eating meals?:  None Help from another person taking care of personal grooming?: A Little Help from another person toileting, which includes using toliet, bedpan, or urinal?: A Lot Help from another person bathing (including washing, rinsing, drying)?: A Lot Help from another person to put on and taking off regular upper body clothing?: A Little Help from another person to put on and taking off regular lower body clothing?: A Lot 6 Click Score: 16   End of Session Equipment Utilized During Treatment: Gait belt;Rolling walker (2 wheels)  Activity Tolerance: Patient tolerated treatment well Patient left: in bed;Other (comment);with nursing/sitter in room;with call bell/phone within reach (sitting EOB)  OT Visit Diagnosis: Unsteadiness on feet (R26.81);Other abnormalities of gait and mobility (R26.89);Muscle weakness (generalized) (M62.81);Repeated falls (R29.6);Pain Pain - part of body:  (back)                Time: 4098-1191 OT Time Calculation (min): 23 min Charges:  OT General Charges $OT Visit: 1 Visit OT Evaluation $OT Eval Moderate Complexity: 1 Mod OT Treatments $Self Care/Home Management : 8-22 mins    Galen Manila 11/25/2022, 2:19 PM

## 2022-11-25 NOTE — Progress Notes (Signed)
Echocardiogram 2D Echocardiogram has been performed.  Jasmine Mccormick 11/25/2022, 3:09 PM

## 2022-11-26 DIAGNOSIS — I10 Essential (primary) hypertension: Secondary | ICD-10-CM | POA: Diagnosis not present

## 2022-11-26 DIAGNOSIS — E785 Hyperlipidemia, unspecified: Secondary | ICD-10-CM | POA: Diagnosis not present

## 2022-11-26 DIAGNOSIS — K55069 Acute infarction of intestine, part and extent unspecified: Secondary | ICD-10-CM | POA: Diagnosis not present

## 2022-11-26 DIAGNOSIS — N179 Acute kidney failure, unspecified: Secondary | ICD-10-CM | POA: Diagnosis not present

## 2022-11-26 LAB — CBC WITH DIFFERENTIAL/PLATELET
Abs Immature Granulocytes: 0 10*3/uL (ref 0.00–0.07)
Basophils Absolute: 0 10*3/uL (ref 0.0–0.1)
Basophils Relative: 0 %
Eosinophils Absolute: 0 10*3/uL (ref 0.0–0.5)
Eosinophils Relative: 0 %
HCT: 39.3 % (ref 36.0–46.0)
Hemoglobin: 14 g/dL (ref 12.0–15.0)
Lymphocytes Relative: 16 %
Lymphs Abs: 0.8 10*3/uL (ref 0.7–4.0)
MCH: 32 pg (ref 26.0–34.0)
MCHC: 35.6 g/dL (ref 30.0–36.0)
MCV: 89.9 fL (ref 80.0–100.0)
Monocytes Absolute: 0.7 10*3/uL (ref 0.1–1.0)
Monocytes Relative: 14 %
Neutro Abs: 3.6 10*3/uL (ref 1.7–7.7)
Neutrophils Relative %: 70 %
Platelets: 182 10*3/uL (ref 150–400)
RBC: 4.37 MIL/uL (ref 3.87–5.11)
RDW: 12.5 % (ref 11.5–15.5)
WBC: 5.2 10*3/uL (ref 4.0–10.5)
nRBC: 0 % (ref 0.0–0.2)
nRBC: 0 /100 WBC

## 2022-11-26 LAB — RENAL FUNCTION PANEL
Albumin: 3 g/dL — ABNORMAL LOW (ref 3.5–5.0)
Anion gap: 15 (ref 5–15)
BUN: 78 mg/dL — ABNORMAL HIGH (ref 8–23)
CO2: 23 mmol/L (ref 22–32)
Calcium: 8 mg/dL — ABNORMAL LOW (ref 8.9–10.3)
Chloride: 95 mmol/L — ABNORMAL LOW (ref 98–111)
Creatinine, Ser: 2.12 mg/dL — ABNORMAL HIGH (ref 0.44–1.00)
GFR, Estimated: 22 mL/min — ABNORMAL LOW (ref >60)
Glucose, Bld: 103 mg/dL — ABNORMAL HIGH (ref 70–99)
Phosphorus: 2.4 mg/dL — ABNORMAL LOW (ref 2.5–4.6)
Potassium: 3.2 mmol/L — ABNORMAL LOW (ref 3.5–5.1)
Sodium: 133 mmol/L — ABNORMAL LOW (ref 135–145)

## 2022-11-26 LAB — URINE CULTURE

## 2022-11-26 LAB — MAGNESIUM: Magnesium: 2.1 mg/dL (ref 1.7–2.4)

## 2022-11-26 MED ORDER — HYDRALAZINE HCL 25 MG PO TABS
25.0000 mg | ORAL_TABLET | Freq: Two times a day (BID) | ORAL | Status: DC
  Administered 2022-11-26 (×2): 25 mg via ORAL
  Filled 2022-11-26 (×2): qty 1

## 2022-11-26 MED ORDER — POTASSIUM CHLORIDE CRYS ER 10 MEQ PO TBCR
40.0000 meq | EXTENDED_RELEASE_TABLET | Freq: Once | ORAL | Status: AC
  Administered 2022-11-26: 40 meq via ORAL
  Filled 2022-11-26: qty 4

## 2022-11-26 MED ORDER — K PHOS MONO-SOD PHOS DI & MONO 155-852-130 MG PO TABS
250.0000 mg | ORAL_TABLET | Freq: Two times a day (BID) | ORAL | Status: DC
  Administered 2022-11-26 – 2022-11-28 (×5): 250 mg via ORAL
  Filled 2022-11-26 (×5): qty 1

## 2022-11-26 NOTE — Progress Notes (Addendum)
PROGRESS NOTE    Jasmine Mccormick  ZOX:[id number] DOB: 1934/01/10 DOA: 11/24/2022 PCP: Tresa Garter, MD    Chief Complaint  Patient presents with   Fall    Brief Narrative:  Patient is a 89 year old female history of ulcerative colitis, A-fib, mesenteric artery thrombosis, memory impairment, hypothyroidism, hypertension, hyperlipidemia presented to the ED after multiple falls at home.  Patient denies any syncopal episodes.  Patient noted to have had poor oral intake over the past week prior to admission.  No change in ostomy output.  Patient seen in the ED, CT head CT C-spine unremarkable.  Patient noted to be in acute kidney injury, noted to be orthostatic, and patient admitted for further evaluation and management.   Assessment & Plan:   Principal Problem:   AKI (acute kidney injury) Active Problems:   Dyslipidemia   Essential hypertension   Ulcerative colitis   Hypothyroidism   Mesenteric artery thrombosis   Paroxysmal atrial fibrillation   Memory problem   Near syncope   Metabolic acidosis   Hypophosphatemia  #1 AKI/near syncope -Patient presented after 2 falls at home, denies any significant syncopal episode. -It is noted per admitting physician that prior to patient's fall she felt lightheaded after standing up from using the bathroom and was walking out when she fell. -Likely secondary to orthostasis, as patient noted to be orthostatic on admission. -Creatinine on admission noted at 3.47 from a baseline of 1.1 in the setting of decreased oral intake. -Urinalysis ordered cloudy, trace leukocytes, nitrite negative, few bacteria, 11-20 WBCs. -Urine sodium at < 10. -Renal ultrasound negative for hydronephrosis. -Patient noted to be acidotic likely secondary to AKI, as such IV fluids have been changed to bicarb drip at 125 cc an hour.   -Monitor urine output.   -Renal function started to slowly trend back down.  -Repeat labs in the AM.  2.  Metabolic  acidosis -Likely secondary to acute kidney injury. -Patient currently afebrile.  Normal WBCs. -Continue bicarb drip for another 24 hours.  -Urinalysis concerning for UTI and as such patient placed on empiric IV antibiotics.  -Follow.  3.  Paroxysmal atrial fibrillation -Currently normal sinus rhythm.   -Continue amiodarone, Toprol-XL noted to recently be reduced due to bradycardia. -Eliquis for anticoagulation.  4.  Probable UTI -Urinalysis concerning for UTI. -Urine cultures with insignificant growth.   -Treat empirically with IV Rocephin D2/3.   5.  Hypothyroidism -TSH at 1.875.   -Continue home regimen Synthroid.  6.  Hyperlipidemia -Continue Zetia, simvastatin.    7.  History of ulcerative colitis -Status post remote total colostomy with end ileostomy. -Outpatient follow-up with GI.  8.  History of mesenteric artery thrombosis -Eliquis.   9.  Hypertension -Continue Toprol-XL. -IV hydralazine as needed  10.  Hyponatremia -Likely secondary to hypovolemic hyponatremia. -Urine Sodium < 10. -Improving with hydration. -IV fluids.  11.  Hypokalemia -Replete. -Repeat labs in the AM.  12.  Hypophosphatemia -Replete.    DVT prophylaxis: Eliquis Code Status: Full Family Communication: Updated patient and niece at bedside. Disposition: SNF versus home with home health.  Status is: Inpatient The patient will require care spanning > 2 midnights and should be moved to inpatient because: Severity of illness   Consultants:  None  Procedures:  CT head CT C-spine 11/24/2022 Chest x-ray 11/25/2022 Abdominal films 11/25/2022 Renal ultrasound 11/25/2022 2D echo 11/25/2022  Antimicrobials:  IV Rocephin 11/25/2022>>>>   Subjective: Sitting on commode in the bathroom.  She denies any chest pain or shortness of  breath.  Feels she has good urine output.  No change with ostomy output.  Overall feeling better.  Niece at bedside.    Objective: Vitals:   11/25/22 1622  11/25/22 2108 11/26/22 0752 11/26/22 1234  BP: (!) 189/75 (!) 195/76 (!) 163/64 (!) 152/73  Pulse: (!) 59 62 60 62  Resp:  17 17   Temp: 97.9 F (36.6 C) 97.7 F (36.5 C) (!) 97.5 F (36.4 C)   TempSrc:  Oral Oral   SpO2: 96% 97% 98% 98%    Intake/Output Summary (Last 24 hours) at 11/26/2022 1341 Last data filed at 11/26/2022 0915 Gross per 24 hour  Intake 1202.62 ml  Output 100 ml  Net 1102.62 ml    There were no vitals filed for this visit.  Examination:  General exam: NAD.  Kyphosis. Respiratory system: Lungs clear to auscultation bilaterally.  No wheezes, no crackles, no rhonchi.  Fair air movement.  Speaking in full sentences.   Cardiovascular system: Regular rate rhythm no murmurs rubs or gallops.  No JVD.  No lower extremity edema.  Gastrointestinal system: Abdomen is soft, nontender, nondistended, positive bowel sounds.  Ostomy pouch intact.  Central nervous system: Alert and oriented. No focal neurological deficits. Extremities: Symmetric 5 x 5 power. Skin: No rashes, lesions or ulcers Psychiatry: Judgement and insight appear normal. Mood & affect appropriate.     Data Reviewed: I have personally reviewed following labs and imaging studies  CBC: Recent Labs  Lab 11/24/22 1242 11/25/22 0503 11/26/22 0330  WBC 7.8 6.3 5.2  NEUTROABS 6.4  --  3.6  HGB 16.1* 15.8* 14.0  HCT 47.7* 43.8 39.3  MCV 94.6 90.1 89.9  PLT 190 200 182     Basic Metabolic Panel: Recent Labs  Lab 11/24/22 1242 11/25/22 0503 11/26/22 0330  NA 128* 127* 133*  K 4.1 3.6 3.2*  CL 91* 99 95*  CO2 19* 14* 23  GLUCOSE 115* 105* 103*  BUN 81* 97* 78*  CREATININE 3.47* 3.64* 2.12*  CALCIUM 8.9 7.9* 8.0*  MG  --   --  2.1  PHOS  --   --  2.4*     GFR: CrCl cannot be calculated (Unknown ideal weight.).  Liver Function Tests: Recent Labs  Lab 11/26/22 0330  ALBUMIN 3.0*    CBG: No results for input(s): "GLUCAP" in the last 168 hours.   Recent Results (from the past 240  hour(s))  Urine Culture     Status: None (Preliminary result)   Collection Time: 11/25/22  9:15 AM   Specimen: Urine, Catheterized  Result Value Ref Range Status   Specimen Description URINE, CATHETERIZED  Final   Special Requests   Final    NONE Reflexed from [id number] Performed at [facility], [address], [place], [place] [address]    Culture PENDING  Incomplete   Report Status PENDING  Incomplete         Radiology Studies: ECHOCARDIOGRAM COMPLETE  Result Date: 11/25/2022    ECHOCARDIOGRAM REPORT   Patient Name:   MICHAELEEN KNITTER Kaelin Date of Exam: 11/25/2022 Medical Rec #:  [id number]          Height:       62.5 in Accession #:    [id number]         Weight:       142.1 lb Date of Birth:  1933/10/27         BSA:          1.663 m Patient  Age:    88 years           BP:           160/77 mmHg Patient Gender: F                  HR:           57 bpm. Exam Location:  Inpatient Procedure: 2D Echo, Cardiac Doppler and Color Doppler Indications:    Syncope R55  History:        Patient has prior history of Echocardiogram examinations, most                 recent 03/30/2021. Arrythmias:Atrial Fibrillation,                 Signs/Symptoms:Syncope; Risk Factors:Hypertension and                 Dyslipidemia.  Sonographer:    Lucendia Herrlich Referring Phys: [id number] Lyn Hollingshead B MELVIN IMPRESSIONS  1. Left ventricular ejection fraction, by estimation, is 60 to 65%. The left ventricle has normal function. The left ventricle has no regional wall motion abnormalities. There is mild left ventricular hypertrophy. Left ventricular diastolic parameters are consistent with Grade I diastolic dysfunction (impaired relaxation).  2. Right ventricular systolic function is normal. The right ventricular size is normal. There is normal pulmonary artery systolic pressure. The estimated right ventricular systolic pressure is 28.4 mmHg.  3. Left atrial size was moderately dilated.  4. Right atrial size was mildly dilated.  5.  The mitral valve is degenerative. Mild mitral valve regurgitation.  6. Aortic valve regurgitation is not visualized. Aortic valve sclerosis/calcification is present, without any evidence of aortic stenosis.  7. The inferior vena cava is normal in size with greater than 50% respiratory variability, suggesting right atrial pressure of 3 mmHg. FINDINGS  Left Ventricle: Left ventricular ejection fraction, by estimation, is 60 to 65%. The left ventricle has normal function. The left ventricle has no regional wall motion abnormalities. The left ventricular internal cavity size was normal in size. There is  mild left ventricular hypertrophy. Left ventricular diastolic parameters are consistent with Grade I diastolic dysfunction (impaired relaxation). Right Ventricle: The right ventricular size is normal. Right ventricular systolic function is normal. There is normal pulmonary artery systolic pressure. The tricuspid regurgitant velocity is 2.52 m/s, and with an assumed right atrial pressure of 3 mmHg,  the estimated right ventricular systolic pressure is 28.4 mmHg. Left Atrium: Left atrial size was moderately dilated. Right Atrium: Right atrial size was mildly dilated. Pericardium: There is no evidence of pericardial effusion. Mitral Valve: The mitral valve is degenerative in appearance. There is moderate calcification of the mitral valve leaflet(s). Mild mitral valve regurgitation. Tricuspid Valve: Tricuspid valve regurgitation is mild. Aortic Valve: Aortic valve regurgitation is not visualized. Aortic valve sclerosis/calcification is present, without any evidence of aortic stenosis. Aortic valve mean gradient measures 9.0 mmHg. Aortic valve peak gradient measures 16.3 mmHg. Aortic valve area, by VTI measures 0.93 cm. Pulmonic Valve: Pulmonic valve regurgitation is not visualized. Aorta: The aortic root and ascending aorta are structurally normal, with no evidence of dilitation. Venous: The inferior vena cava is normal in  size with greater than 50% respiratory variability, suggesting right atrial pressure of 3 mmHg. IAS/Shunts: No atrial level shunt detected by color flow Doppler.  LEFT VENTRICLE PLAX 2D LVIDd:         3.50 cm   Diastology LVIDs:         2.40  cm   LV e' medial:    4.58 cm/s LV PW:         1.30 cm   LV E/e' medial:  14.0 LV IVS:        0.90 cm   LV e' lateral:   4.28 cm/s LVOT diam:     1.80 cm   LV E/e' lateral: 15.0 LV SV:         43 LV SV Index:   26 LVOT Area:     2.54 cm  RIGHT VENTRICLE             IVC RV S prime:     14.40 cm/s  IVC diam: 1.90 cm TAPSE (M-mode): 1.5 cm LEFT ATRIUM             Index        RIGHT ATRIUM           Index LA diam:        4.40 cm 2.65 cm/m   RA Area:     15.90 cm LA Vol (A2C):   53.2 ml 31.96 ml/m  RA Volume:   39.50 ml  23.75 ml/m LA Vol (A4C):   44.5 ml 26.76 ml/m LA Biplane Vol: 53.0 ml 31.87 ml/m  AORTIC VALVE AV Area (Vmax):    1.06 cm AV Area (Vmean):   0.93 cm AV Area (VTI):     0.93 cm AV Vmax:           202.00 cm/s AV Vmean:          144.000 cm/s AV VTI:            0.465 m AV Peak Grad:      16.3 mmHg AV Mean Grad:      9.0 mmHg LVOT Vmax:         84.20 cm/s LVOT Vmean:        52.767 cm/s LVOT VTI:          0.171 m LVOT/AV VTI ratio: 0.37  AORTA Ao Root diam: 2.90 cm Ao Asc diam:  2.90 cm MITRAL VALVE               TRICUSPID VALVE MV Area (PHT): 2.62 cm    TR Peak grad:   25.4 mmHg MV Decel Time: 290 msec    TR Vmax:        252.00 cm/s MV E velocity: 64.00 cm/s MV A velocity: 73.80 cm/s  SHUNTS MV E/A ratio:  0.87        Systemic VTI:  0.17 m                            Systemic Diam: 1.80 cm Carolan Clines Electronically signed by Carolan Clines Signature Date/Time: 11/25/2022/3:33:35 PM    Final    DG Abd 2 Views  Result Date: 11/25/2022 CLINICAL DATA:  Pain EXAM: ABDOMEN - 2 VIEW COMPARISON:  None Available. FINDINGS: The bowel gas pattern is normal. There is no evidence of free air. No radio-opaque calculi or other significant radiographic abnormality is seen.  IMPRESSION: Negative. Electronically Signed   By: Layla Maw M.D.   On: 11/25/2022 09:53   DG Chest 2 View  Result Date: 11/25/2022 CLINICAL DATA:  2672 Acidosis 2672 EXAM: CHEST - 2 VIEW COMPARISON:  03/29/2021. FINDINGS: The heart size and mediastinal contours are within normal limits. Both lungs are clear. No pneumothorax or pleural effusion. There are thoracic degenerative changes. IMPRESSION:  No active cardiopulmonary disease. Electronically Signed   By: Layla Maw M.D.   On: 11/25/2022 09:52   US RENAL  Result Date: 11/25/2022 CLINICAL DATA:  045409 ARF (acute renal failure) [id number] EXAM: RENAL / URINARY TRACT ULTRASOUND COMPLETE COMPARISON:  None Available. FINDINGS: The right kidney measured 10.6 cm and the left kidney measured 9.7 cm. The kidneys demonstrate normal echogenicity. Left kidney cysts identified measuring 1.8 cm this is not need imaging follow up. No shadowing stones are seen. No hydronephrosis. The urinary bladder appeared unremarkable. Bladder volumes were not measured and no postvoid images are submitted. IMPRESSION: Left kidney cyst. Otherwise unremarkable study which was technologically limited. Electronically Signed   By: Layla Maw M.D.   On: 11/25/2022 09:47        Scheduled Meds:  amiodarone  200 mg Oral Daily   apixaban  2.5 mg Oral BID   ezetimibe  10 mg Oral Daily   hydrALAZINE  25 mg Oral BID   levothyroxine  75 mcg Oral Daily   metoprolol succinate  25 mg Oral Daily   pantoprazole  40 mg Oral Daily   phosphorus  250 mg Oral BID   simvastatin  20 mg Oral q1800   sodium chloride flush  3 mL Intravenous Q12H   Continuous Infusions:  cefTRIAXone (ROCEPHIN)  IV 200 mL/hr at 11/26/22 0413   sodium bicarbonate 150 mEq in sterile water 1,150 mL infusion 100 mL/hr at 11/26/22 0813     LOS: 1 day    Time spent: 40 minutes    Ramiro Harvest, MD [facility]   To contact the attending provider between 7A-7P or the covering  provider during after hours 7P-7A, please log into the web site [email or web address] and access using universal [place] password for that web site. If you do not have the password, please call the hospital operator.  11/26/2022, 1:41 PM

## 2022-11-26 NOTE — TOC Progression Note (Signed)
Transition of Care [facility]) - Progression Note    Patient Details  Name: Jasmine Mccormick MRN: [id number] Date of Birth: April 09, 1934  [facility] CM/SW Contact  Helene Kelp, [place] Phone Number: 11/26/2022, 10:29 AM  Clinical Narrative:    CSW followed-up pt SNF Rehab referral and bed offer from  [facility].   This Clinical research associate contacted patient's niece Antony Odea / primary caregiver) [phone] to update her on the SNF bed offer and identify possible concerns/alignment with the accepted bed-offer. Gavin Pound noted she was in agreement with the bed-offer and expressed she will be visiting the patient today and would like to update the pt herself about the bed-offer (since the pt is familiar with the SNF provider from a previous historical admission from a different rehab need).   This Clinical research associate will follow-up with the pt and Gavin Pound through out the day and work on [employer] transfer process for the coming business day (11/28/22).     Expected Discharge Plan: Skilled Nursing Facility [facility] vs SNF) Barriers to Discharge: Continued Medical Work up  Expected Discharge Plan and Services   Living arrangements for the past 2 months: Single Family Home                  Social Determinants of Health (SDOH) Interventions SDOH Screenings   Food Insecurity: No Food Insecurity (11/24/2022)  Housing: Low Risk  (11/24/2022)  Transportation Needs: No Transportation Needs (11/24/2022)  Utilities: Not At Risk (11/24/2022)  Alcohol Screen: Low Risk  (06/15/2022)  Depression (PHQ2-9): Low Risk  (06/15/2022)  Financial Resource Strain: Low Risk  (06/15/2022)  Physical Activity: Sufficiently Active (06/15/2022)  Social Connections: Moderately Integrated (06/15/2022)  Stress: No Stress Concern Present (06/15/2022)  Tobacco Use: Low Risk  (11/24/2022)    Readmission Risk Interventions     No data to display

## 2022-11-26 NOTE — Plan of Care (Signed)
  Problem: Physical Regulation: Goal: Complications related to the disease process, condition or treatment will be avoided or minimized Outcome: Progressing   

## 2022-11-26 NOTE — Progress Notes (Signed)
Occupational Therapy Treatment Patient Details Name: Jasmine Mccormick MRN: [id number] DOB: Oct 18, 1933 Today's Date: 11/26/2022   History of present illness Pt is an 89 y.o. female who presented 11/24/22 s/p fall at home with near syncopal/syncopal episode. Per chart, family endorses pt had not been eating or drinking fluids as usual. PMH: HTN, HLD, hypothyroidism, ileostomy, memory loss, Meniere disease, osteoporosis, atrial fibrillation   OT comments  Pt making progress with functional goals. Upon OT arrival, pt in bathroom without RW (RW parked at sink). OT educated pt on using call bell to request assist from staff when OOB to the bathroom to prevent falls, pt replied " Well you don't have to tell them". Pt completed toilet transfer min guard A, toileting min guard A to manage clothing and Sup with anterior hygiene. Pt bale to step in and out of shower suing grab bar with min a. Pt stood at sink for hygiene tasks min guard A. Updating d/c to short term SNF rehab. Per SW notes, pt's family is planning for d/c to [facility] before d/c home and that they are planning for ALF placement eventually. OT will continue to follow acutely to maximize level of function and safety   Recommendations for follow up therapy are one component of a multi-disciplinary discharge planning process, led by the attending physician.  Recommendations may be updated based on patient status, additional functional criteria and insurance authorization.    Assistance Recommended at Discharge Intermittent Supervision/Assistance  Patient can return home with the following  A lot of help with bathing/dressing/bathroom;A little help with walking and/or transfers;Assistance with cooking/housework;Direct supervision/assist for financial management;Help with stairs or ramp for entrance;Assist for transportation;Direct supervision/assist for medications management   Equipment Recommendations  Other (comment) (TBD at next venue of  care)    Recommendations for Other Services      Precautions / Restrictions Precautions Precautions: Fall Precaution Comments: watch BP (orthostatic 4/11), ileostomy Restrictions Weight Bearing Restrictions: No       Mobility Bed Mobility               General bed mobility comments: upon arrival pt OOB in bathroom without RW    Transfers Overall transfer level: Needs assistance Equipment used: Rolling walker (2 wheels) Transfers: Sit to/from Stand Sit to Stand: Min guard                 Balance Overall balance assessment: Needs assistance Sitting-balance support: Bilateral upper extremity supported, Single extremity supported, Feet supported Sitting balance-Leahy Scale: Fair     Standing balance support: Bilateral upper extremity supported, During functional activity, Single extremity supported Standing balance-Leahy Scale: Poor                             ADL either performed or assessed with clinical judgement   ADL Overall ADL's : Needs assistance/impaired     Grooming: Wash/dry face;Wash/dry hands;Standing;Min guard       Lower Body Bathing: Minimal assistance Lower Body Bathing Details (indicate cue type and reason): simulated standing at sink         Toilet Transfer: Min guard;Ambulation;Cueing for safety;Regular Toilet;Grab bars Toilet Transfer Details (indicate cue type and reason): pt in bathrrom without RW when OT arrived Toileting- Architect and Hygiene: Minimal assistance;Sit to/from stand   Tub/ Shower Transfer: Minimal assistance;Grab bars;BSC/3in1   Functional mobility during ADLs: Minimal assistance;Min guard;Cueing for safety;Rolling walker (2 wheels) General ADL Comments: assist due to impaired balance, unsteady  Extremity/Trunk Assessment Upper Extremity Assessment Upper Extremity Assessment: Generalized weakness   Lower Extremity Assessment Lower Extremity Assessment: Defer to PT evaluation    Cervical / Trunk Assessment Cervical / Trunk Assessment: Kyphotic    Vision Baseline Vision/History: 1 Wears glasses Ability to See in Adequate Light: 0 Adequate Patient Visual Report: No change from baseline     Perception     Praxis      Cognition Arousal/Alertness: Awake/alert Behavior During Therapy: WFL for tasks assessed/performed Overall Cognitive Status: History of cognitive impairments - at baseline Area of Impairment: Safety/judgement                         Safety/Judgement: Decreased awareness of safety              Exercises      Shoulder Instructions       General Comments      Pertinent Vitals/ Pain       Pain Assessment Pain Assessment: No/denies pain Pain Score: 0-No pain  Home Living                                          Prior Functioning/Environment              Frequency  Min 2X/week        Progress Toward Goals  OT Goals(current goals can now be found in the care plan section)  Progress towards OT goals: Progressing toward goals     Plan Discharge plan needs to be updated    Co-evaluation                 AM-PAC OT "6 Clicks" Daily Activity     Outcome Measure   Help from another person eating meals?: None Help from another person taking care of personal grooming?: A Little Help from another person toileting, which includes using toliet, bedpan, or urinal?: A Little Help from another person bathing (including washing, rinsing, drying)?: A Little Help from another person to put on and taking off regular upper body clothing?: A Little Help from another person to put on and taking off regular lower body clothing?: A Lot 6 Click Score: 18    End of Session Equipment Utilized During Treatment: Gait belt;Rolling walker (2 wheels)  OT Visit Diagnosis: Unsteadiness on feet (R26.81);Other abnormalities of gait and mobility (R26.89);Muscle weakness (generalized) (M62.81);Repeated falls  (R29.6);Pain   Activity Tolerance Patient tolerated treatment well   Patient Left in bed;with call bell/phone within reach;Other (comment) (sitting EOB)   Nurse Communication          Time: 1610-9604 OT Time Calculation (min): 14 min  Charges: OT General Charges $OT Visit: 1 Visit OT Treatments $Self Care/Home Management : 8-22 mins    Galen Manila 11/26/2022, 12:19 PM

## 2022-11-27 DIAGNOSIS — K55069 Acute infarction of intestine, part and extent unspecified: Secondary | ICD-10-CM | POA: Diagnosis not present

## 2022-11-27 DIAGNOSIS — N179 Acute kidney failure, unspecified: Secondary | ICD-10-CM | POA: Diagnosis not present

## 2022-11-27 DIAGNOSIS — E785 Hyperlipidemia, unspecified: Secondary | ICD-10-CM | POA: Diagnosis not present

## 2022-11-27 DIAGNOSIS — I10 Essential (primary) hypertension: Secondary | ICD-10-CM | POA: Diagnosis not present

## 2022-11-27 LAB — CBC WITH DIFFERENTIAL/PLATELET
Abs Immature Granulocytes: 0.02 10*3/uL (ref 0.00–0.07)
Basophils Absolute: 0 10*3/uL (ref 0.0–0.1)
Basophils Relative: 1 %
Eosinophils Absolute: 0 10*3/uL (ref 0.0–0.5)
Eosinophils Relative: 1 %
HCT: 39.1 % (ref 36.0–46.0)
Hemoglobin: 13.7 g/dL (ref 12.0–15.0)
Immature Granulocytes: 1 %
Lymphocytes Relative: 19 %
Lymphs Abs: 0.8 10*3/uL (ref 0.7–4.0)
MCH: 31.5 pg (ref 26.0–34.0)
MCHC: 35 g/dL (ref 30.0–36.0)
MCV: 89.9 fL (ref 80.0–100.0)
Monocytes Absolute: 0.9 10*3/uL (ref 0.1–1.0)
Monocytes Relative: 20 %
Neutro Abs: 2.7 10*3/uL (ref 1.7–7.7)
Neutrophils Relative %: 58 %
Platelets: 183 10*3/uL (ref 150–400)
RBC: 4.35 MIL/uL (ref 3.87–5.11)
RDW: 12.5 % (ref 11.5–15.5)
WBC: 4.4 10*3/uL (ref 4.0–10.5)
nRBC: 0 % (ref 0.0–0.2)

## 2022-11-27 LAB — MAGNESIUM: Magnesium: 1.8 mg/dL (ref 1.7–2.4)

## 2022-11-27 LAB — RENAL FUNCTION PANEL
Albumin: 3.1 g/dL — ABNORMAL LOW (ref 3.5–5.0)
Anion gap: 9 (ref 5–15)
BUN: 39 mg/dL — ABNORMAL HIGH (ref 8–23)
CO2: 29 mmol/L (ref 22–32)
Calcium: 7.8 mg/dL — ABNORMAL LOW (ref 8.9–10.3)
Chloride: 96 mmol/L — ABNORMAL LOW (ref 98–111)
Creatinine, Ser: 1.29 mg/dL — ABNORMAL HIGH (ref 0.44–1.00)
GFR, Estimated: 40 mL/min — ABNORMAL LOW (ref >60)
Glucose, Bld: 118 mg/dL — ABNORMAL HIGH (ref 70–99)
Phosphorus: 1.9 mg/dL — ABNORMAL LOW (ref 2.5–4.6)
Potassium: 3.6 mmol/L (ref 3.5–5.1)
Sodium: 134 mmol/L — ABNORMAL LOW (ref 135–145)

## 2022-11-27 MED ORDER — POTASSIUM PHOSPHATES 15 MMOLE/5ML IV SOLN
30.0000 mmol | Freq: Once | INTRAVENOUS | Status: AC
  Administered 2022-11-27: 30 mmol via INTRAVENOUS
  Filled 2022-11-27: qty 10

## 2022-11-27 MED ORDER — SODIUM CHLORIDE 0.9 % IV SOLN: INTRAVENOUS | Status: DC

## 2022-11-27 NOTE — Plan of Care (Signed)
  Problem: Education: Goal: Knowledge of condition and prescribed therapy will improve Outcome: Progressing   

## 2022-11-27 NOTE — Progress Notes (Signed)
PROGRESS NOTE    Jasmine Mccormick  WUJ:[id number] DOB: 1933-10-24 DOA: 11/24/2022 PCP: Tresa Garter, MD    Chief Complaint  Patient presents with   Fall    Brief Narrative:  Patient is a 89 year old female history of ulcerative colitis, A-fib, mesenteric artery thrombosis, memory impairment, hypothyroidism, hypertension, hyperlipidemia presented to the ED after multiple falls at home.  Patient denies any syncopal episodes.  Patient noted to have had poor oral intake over the past week prior to admission.  No change in ostomy output.  Patient seen in the ED, CT head CT C-spine unremarkable.  Patient noted to be in acute kidney injury, noted to be orthostatic, and patient admitted for further evaluation and management.   Assessment & Plan:   Principal Problem:   AKI (acute kidney injury) Active Problems:   Dyslipidemia   Essential hypertension   Ulcerative colitis   Hypothyroidism   Mesenteric artery thrombosis   Paroxysmal atrial fibrillation   Memory problem   Near syncope   Metabolic acidosis   Hypophosphatemia  #1 AKI/near syncope -Patient presented after 2 falls at home, denies any significant syncopal episode. -It is noted per admitting physician that prior to patient's fall she felt lightheaded after standing up from using the bathroom and was walking out when she fell. -Likely secondary to orthostasis, as patient noted to be orthostatic on admission. -Creatinine on admission noted at 3.47 from a baseline of 1.1 in the setting of decreased oral intake. -Urinalysis ordered cloudy, trace leukocytes, nitrite negative, few bacteria, 11-20 WBCs. -Urine sodium at < 10. -Renal ultrasound negative for hydronephrosis. -Patient noted to be acidotic likely secondary to AKI, as such IV fluids have been changed to bicarb drip at 125 cc an hour.   -Acidosis resolved and as such discontinue bicarb drip. -Urine output not recorded. -Monitor urine output.   -Renal function  proving, creatinine trending down currently at 1.29.   -Gentle hydration through today and discontinue IV fluids at 5 PM.  -Repeat labs in the AM.  2.  Metabolic acidosis -Likely secondary to acute kidney injury. -Acidosis resolved. -Patient afebrile.  Normal WBCs.   -Bicarb drip discontinued this morning, patient placed on gentle hydration normal saline which we will discontinue around 5 PM.  -Urinalysis concerning for UTI and as such patient placed on empiric IV antibiotics.  -Follow.  3.  Paroxysmal atrial fibrillation -Currently in normal sinus rhythm.  -Continue amiodarone, Toprol-XL noted to recently reduced due to bradycardia. -Eliquis for anticoagulation.  4.  Probable UTI -Urinalysis concerning for UTI. -Urine cultures with insignificant growth.   -Treat empirically with IV Rocephin D3/3.   5.  Hypothyroidism -TSH at 1.875.   -Synthroid.    6.  Hyperlipidemia -Simvastatin, Zetia.    7.  History of ulcerative colitis -Status post remote total colostomy with end ileostomy. -Outpatient follow-up with GI.  8.  History of mesenteric artery thrombosis -Eliquis.   9.  Hypertension -Continue Toprol-XL for BP control.  -IV hydralazine as needed  10.  Hyponatremia -Likely secondary to hypovolemic hyponatremia. -Urine Sodium < 10. -Improved with hydration.   -Continue IV fluids through today and discontinue IV fluids at 5 PM.   11.  Hypokalemia -Replete. -Repeat labs in the AM.  12.  Hypophosphatemia -K-Phos 30 mmol IV x 1.    DVT prophylaxis: Eliquis Code Status: Full Family Communication: Updated patient and niece and nephew at bedside. Disposition: SNF tomorrow.   Status is: Inpatient The patient will require care spanning > 2 midnights  and should be moved to inpatient because: Severity of illness   Consultants:  None  Procedures:  CT head CT C-spine 11/24/2022 Chest x-ray 11/25/2022 Abdominal films 11/25/2022 Renal ultrasound 11/25/2022 2D echo  11/25/2022  Antimicrobials:  IV Rocephin 11/25/2022>>>>   Subjective: Patient sitting up at the side of the bed.  Nephew and niece at bedside.  Patient denies any chest pain.  No shortness of breath.  No abdominal pain.  Was under the impression she was going to go to SNF today.  Good urine output.  Overall feels better.  Tolerating oral intake.  No change in ostomy output.  Patient states posthospitalization is going to [facility] SNF.   Objective: Vitals:   11/26/22 2026 11/27/22 0443 11/27/22 0500 11/27/22 0750  BP: (!) 171/142 (!) 162/64  (!) 112/90  Pulse: (!) 58 60  70  Resp: 17 18  18   Temp: 97.6 F (36.4 C) 98.2 F (36.8 C)  97.8 F (36.6 C)  TempSrc: Oral     SpO2: 97% 98%  98%  Weight:   62.4 kg     Intake/Output Summary (Last 24 hours) at 11/27/2022 1157 Last data filed at 11/26/2022 2200 Gross per 24 hour  Intake 971.93 ml  Output --  Net 971.93 ml    Filed Weights   11/27/22 0500  Weight: 62.4 kg    Examination:  General exam: NAD.  Kyphosis. Respiratory system: CTAB.  No wheezes, no crackles, no rhonchi.  Fair air movement.  Speaking in full sentences.   Cardiovascular system: RRR no murmurs rubs or gallops.  No JVD.  No lower extremity edema.   Gastrointestinal system: Abdomen is soft, nontender, nondistended, positive bowel sounds.  Ostomy pouch intact.  Central nervous system: Alert and oriented. No focal neurological deficits. Extremities: Symmetric 5 x 5 power. Skin: No rashes, lesions or ulcers Psychiatry: Judgement and insight appear normal. Mood & affect appropriate.     Data Reviewed: I have personally reviewed following labs and imaging studies  CBC: Recent Labs  Lab 11/24/22 1242 11/25/22 0503 11/26/22 0330 11/27/22 0456  WBC 7.8 6.3 5.2 4.4  NEUTROABS 6.4  --  3.6 2.7  HGB 16.1* 15.8* 14.0 13.7  HCT 47.7* 43.8 39.3 39.1  MCV 94.6 90.1 89.9 89.9  PLT 190 200 182 183     Basic Metabolic Panel: Recent Labs  Lab 11/24/22 1242  11/25/22 0503 11/26/22 0330 11/27/22 0456  NA 128* 127* 133* 134*  K 4.1 3.6 3.2* 3.6  CL 91* 99 95* 96*  CO2 19* 14* 23 29  GLUCOSE 115* 105* 103* 118*  BUN 81* 97* 78* 39*  CREATININE 3.47* 3.64* 2.12* 1.29*  CALCIUM 8.9 7.9* 8.0* 7.8*  MG  --   --  2.1 1.8  PHOS  --   --  2.4* 1.9*     GFR: Estimated Creatinine Clearance: 27.5 mL/min (A) (by C-G formula based on SCr of 1.29 mg/dL (H)).  Liver Function Tests: Recent Labs  Lab 11/26/22 0330 11/27/22 0456  ALBUMIN 3.0* 3.1*     CBG: No results for input(s): "GLUCAP" in the last 168 hours.   Recent Results (from the past 240 hour(s))  Urine Culture     Status: Abnormal   Collection Time: 11/25/22  9:15 AM   Specimen: Urine, Catheterized  Result Value Ref Range Status   Specimen Description URINE, CATHETERIZED  Final   Special Requests NONE Reflexed from [id number]  Final   Culture (A)  Final    <10,000 COLONIES/mL INSIGNIFICANT  GROWTH Performed at [facility], [address], [place], [place] [address]    Report Status 11/26/2022 FINAL  Final         Radiology Studies: ECHOCARDIOGRAM COMPLETE  Result Date: 11/25/2022    ECHOCARDIOGRAM REPORT   Patient Name:   CARTIER MAPEL Maslowski Date of Exam: 11/25/2022 Medical Rec #:  [id number]          Height:       62.5 in Accession #:    [id number]         Weight:       142.1 lb Date of Birth:  05-Jun-1934         BSA:          1.663 m Patient Age:    88 years           BP:           160/77 mmHg Patient Gender: F                  HR:           57 bpm. Exam Location:  Inpatient Procedure: 2D Echo, Cardiac Doppler and Color Doppler Indications:    Syncope R55  History:        Patient has prior history of Echocardiogram examinations, most                 recent 03/30/2021. Arrythmias:Atrial Fibrillation,                 Signs/Symptoms:Syncope; Risk Factors:Hypertension and                 Dyslipidemia.  Sonographer:    Lucendia Herrlich Referring Phys: [id number] Lyn Hollingshead B MELVIN  IMPRESSIONS  1. Left ventricular ejection fraction, by estimation, is 60 to 65%. The left ventricle has normal function. The left ventricle has no regional wall motion abnormalities. There is mild left ventricular hypertrophy. Left ventricular diastolic parameters are consistent with Grade I diastolic dysfunction (impaired relaxation).  2. Right ventricular systolic function is normal. The right ventricular size is normal. There is normal pulmonary artery systolic pressure. The estimated right ventricular systolic pressure is 28.4 mmHg.  3. Left atrial size was moderately dilated.  4. Right atrial size was mildly dilated.  5. The mitral valve is degenerative. Mild mitral valve regurgitation.  6. Aortic valve regurgitation is not visualized. Aortic valve sclerosis/calcification is present, without any evidence of aortic stenosis.  7. The inferior vena cava is normal in size with greater than 50% respiratory variability, suggesting right atrial pressure of 3 mmHg. FINDINGS  Left Ventricle: Left ventricular ejection fraction, by estimation, is 60 to 65%. The left ventricle has normal function. The left ventricle has no regional wall motion abnormalities. The left ventricular internal cavity size was normal in size. There is  mild left ventricular hypertrophy. Left ventricular diastolic parameters are consistent with Grade I diastolic dysfunction (impaired relaxation). Right Ventricle: The right ventricular size is normal. Right ventricular systolic function is normal. There is normal pulmonary artery systolic pressure. The tricuspid regurgitant velocity is 2.52 m/s, and with an assumed right atrial pressure of 3 mmHg,  the estimated right ventricular systolic pressure is 28.4 mmHg. Left Atrium: Left atrial size was moderately dilated. Right Atrium: Right atrial size was mildly dilated. Pericardium: There is no evidence of pericardial effusion. Mitral Valve: The mitral valve is degenerative in appearance. There is  moderate calcification of the mitral valve leaflet(s). Mild mitral valve regurgitation. Tricuspid Valve: Tricuspid  valve regurgitation is mild. Aortic Valve: Aortic valve regurgitation is not visualized. Aortic valve sclerosis/calcification is present, without any evidence of aortic stenosis. Aortic valve mean gradient measures 9.0 mmHg. Aortic valve peak gradient measures 16.3 mmHg. Aortic valve area, by VTI measures 0.93 cm. Pulmonic Valve: Pulmonic valve regurgitation is not visualized. Aorta: The aortic root and ascending aorta are structurally normal, with no evidence of dilitation. Venous: The inferior vena cava is normal in size with greater than 50% respiratory variability, suggesting right atrial pressure of 3 mmHg. IAS/Shunts: No atrial level shunt detected by color flow Doppler.  LEFT VENTRICLE PLAX 2D LVIDd:         3.50 cm   Diastology LVIDs:         2.40 cm   LV e' medial:    4.58 cm/s LV PW:         1.30 cm   LV E/e' medial:  14.0 LV IVS:        0.90 cm   LV e' lateral:   4.28 cm/s LVOT diam:     1.80 cm   LV E/e' lateral: 15.0 LV SV:         43 LV SV Index:   26 LVOT Area:     2.54 cm  RIGHT VENTRICLE             IVC RV S prime:     14.40 cm/s  IVC diam: 1.90 cm TAPSE (M-mode): 1.5 cm LEFT ATRIUM             Index        RIGHT ATRIUM           Index LA diam:        4.40 cm 2.65 cm/m   RA Area:     15.90 cm LA Vol (A2C):   53.2 ml 31.96 ml/m  RA Volume:   39.50 ml  23.75 ml/m LA Vol (A4C):   44.5 ml 26.76 ml/m LA Biplane Vol: 53.0 ml 31.87 ml/m  AORTIC VALVE AV Area (Vmax):    1.06 cm AV Area (Vmean):   0.93 cm AV Area (VTI):     0.93 cm AV Vmax:           202.00 cm/s AV Vmean:          144.000 cm/s AV VTI:            0.465 m AV Peak Grad:      16.3 mmHg AV Mean Grad:      9.0 mmHg LVOT Vmax:         84.20 cm/s LVOT Vmean:        52.767 cm/s LVOT VTI:          0.171 m LVOT/AV VTI ratio: 0.37  AORTA Ao Root diam: 2.90 cm Ao Asc diam:  2.90 cm MITRAL VALVE               TRICUSPID VALVE MV  Area (PHT): 2.62 cm    TR Peak grad:   25.4 mmHg MV Decel Time: 290 msec    TR Vmax:        252.00 cm/s MV E velocity: 64.00 cm/s MV A velocity: 73.80 cm/s  SHUNTS MV E/A ratio:  0.87        Systemic VTI:  0.17 m                            Systemic Diam: 1.80 cm  Carolan Clines Electronically signed by Carolan Clines Signature Date/Time: 11/25/2022/3:33:35 PM    Final         Scheduled Meds:  amiodarone  200 mg Oral Daily   apixaban  2.5 mg Oral BID   ezetimibe  10 mg Oral Daily   levothyroxine  75 mcg Oral Daily   metoprolol succinate  25 mg Oral Daily   pantoprazole  40 mg Oral Daily   phosphorus  250 mg Oral BID   simvastatin  20 mg Oral q1800   sodium chloride flush  3 mL Intravenous Q12H   Continuous Infusions:  sodium chloride 50 mL/hr at 11/27/22 0932   cefTRIAXone (ROCEPHIN)  IV 2 g (11/26/22 1719)     LOS: 2 days    Time spent: 35 minutes    Ramiro Harvest, MD [facility]   To contact the attending provider between 7A-7P or the covering provider during after hours 7P-7A, please log into the web site [email or web address] and access using universal [place] password for that web site. If you do not have the password, please call the hospital operator.  11/27/2022, 11:57 AM

## 2022-11-28 DIAGNOSIS — E034 Atrophy of thyroid (acquired): Secondary | ICD-10-CM | POA: Diagnosis not present

## 2022-11-28 DIAGNOSIS — K55069 Acute infarction of intestine, part and extent unspecified: Secondary | ICD-10-CM | POA: Diagnosis not present

## 2022-11-28 DIAGNOSIS — E039 Hypothyroidism, unspecified: Secondary | ICD-10-CM | POA: Diagnosis not present

## 2022-11-28 DIAGNOSIS — R55 Syncope and collapse: Secondary | ICD-10-CM | POA: Diagnosis not present

## 2022-11-28 DIAGNOSIS — I480 Paroxysmal atrial fibrillation: Secondary | ICD-10-CM | POA: Diagnosis not present

## 2022-11-28 DIAGNOSIS — I10 Essential (primary) hypertension: Secondary | ICD-10-CM | POA: Diagnosis not present

## 2022-11-28 DIAGNOSIS — E785 Hyperlipidemia, unspecified: Secondary | ICD-10-CM | POA: Diagnosis not present

## 2022-11-28 DIAGNOSIS — I499 Cardiac arrhythmia, unspecified: Secondary | ICD-10-CM | POA: Diagnosis not present

## 2022-11-28 DIAGNOSIS — K5190 Ulcerative colitis, unspecified, without complications: Secondary | ICD-10-CM | POA: Diagnosis not present

## 2022-11-28 DIAGNOSIS — Z9181 History of falling: Secondary | ICD-10-CM | POA: Diagnosis not present

## 2022-11-28 DIAGNOSIS — E8721 Acute metabolic acidosis: Secondary | ICD-10-CM | POA: Diagnosis not present

## 2022-11-28 DIAGNOSIS — Z932 Ileostomy status: Secondary | ICD-10-CM | POA: Diagnosis not present

## 2022-11-28 DIAGNOSIS — W19XXXA Unspecified fall, initial encounter: Secondary | ICD-10-CM

## 2022-11-28 DIAGNOSIS — K219 Gastro-esophageal reflux disease without esophagitis: Secondary | ICD-10-CM | POA: Diagnosis not present

## 2022-11-28 DIAGNOSIS — D6869 Other thrombophilia: Secondary | ICD-10-CM | POA: Diagnosis not present

## 2022-11-28 DIAGNOSIS — K51818 Other ulcerative colitis with other complication: Secondary | ICD-10-CM | POA: Diagnosis not present

## 2022-11-28 DIAGNOSIS — E872 Acidosis, unspecified: Secondary | ICD-10-CM | POA: Diagnosis not present

## 2022-11-28 DIAGNOSIS — N179 Acute kidney failure, unspecified: Secondary | ICD-10-CM | POA: Diagnosis not present

## 2022-11-28 DIAGNOSIS — R413 Other amnesia: Secondary | ICD-10-CM | POA: Diagnosis not present

## 2022-11-28 DIAGNOSIS — Z7401 Bed confinement status: Secondary | ICD-10-CM | POA: Diagnosis not present

## 2022-11-28 DIAGNOSIS — I4891 Unspecified atrial fibrillation: Secondary | ICD-10-CM | POA: Diagnosis not present

## 2022-11-28 LAB — RENAL FUNCTION PANEL
Albumin: 2.8 g/dL — ABNORMAL LOW (ref 3.5–5.0)
Anion gap: 13 (ref 5–15)
BUN: 22 mg/dL (ref 8–23)
CO2: 28 mmol/L (ref 22–32)
Calcium: 7.6 mg/dL — ABNORMAL LOW (ref 8.9–10.3)
Chloride: 96 mmol/L — ABNORMAL LOW (ref 98–111)
Creatinine, Ser: 1.09 mg/dL — ABNORMAL HIGH (ref 0.44–1.00)
GFR, Estimated: 49 mL/min — ABNORMAL LOW (ref >60)
Glucose, Bld: 84 mg/dL (ref 70–99)
Phosphorus: 3.4 mg/dL (ref 2.5–4.6)
Potassium: 4.1 mmol/L (ref 3.5–5.1)
Sodium: 137 mmol/L (ref 135–145)

## 2022-11-28 LAB — GLUCOSE, CAPILLARY: Glucose-Capillary: 77 mg/dL (ref 70–99)

## 2022-11-28 MED ORDER — APIXABAN 5 MG PO TABS: 5.0000 mg | ORAL_TABLET | Freq: Two times a day (BID) | ORAL | Status: DC

## 2022-11-28 MED ORDER — ACETAMINOPHEN 325 MG PO TABS: 650.0000 mg | ORAL_TABLET | Freq: Four times a day (QID) | ORAL | Status: DC | PRN

## 2022-11-28 NOTE — Progress Notes (Signed)
Mobility Specialist - Progress Note   11/28/22 1001  Mobility  Activity Ambulated with assistance in room  Level of Assistance Contact guard assist, steadying assist  Assistive Device Front wheel walker  Distance Ambulated (ft) 20 ft  Activity Response Tolerated well  Mobility Referral Yes  $Mobility charge 1 Mobility   Pt was received ambulating independently in room and agreeable to mobility. Session was limited d/t pt fatigue. No faults throughout session. Pt was left EOB with all needs met and bed alarm on.   Alda Lea  Mobility Specialist Please contact via Special educational needs teacher or Rehab office at [phone]

## 2022-11-28 NOTE — Progress Notes (Signed)
Physical Therapy Treatment Patient Details Name: Jasmine Mccormick MRN: [id number] DOB: 1934/06/22 Today's Date: 11/28/2022   History of Present Illness Pt is an 89 y.o. female who presented 11/24/22 s/p fall at home with near syncopal/syncopal episode. Per chart, family endorses pt had not been eating or drinking fluids as usual. PMH: HTN, HLD, hypothyroidism, ileostomy, memory loss, Meniere disease, osteoporosis, atrial fibrillation    PT Comments    Pt admitted with above diagnosis. Pt was able to ambulate with RW with min guard assist and cues for safety. Pt needs cues for being in proximity to RW.  Pt supposed to go to SNF today for therapy.   Pt currently with functional limitations due to balance and endurance deficits. Pt will benefit from acute skilled PT to increase their independence and safety with mobility to allow discharge.      Recommendations for follow up therapy are one component of a multi-disciplinary discharge planning process, led by the attending physician.  Recommendations may be updated based on patient status, additional functional criteria and insurance authorization.  Follow Up Recommendations  Can patient physically be transported by private vehicle: Yes    Assistance Recommended at Discharge Intermittent Supervision/Assistance  Patient can return home with the following A little help with walking and/or transfers;A little help with bathing/dressing/bathroom;Assistance with cooking/housework;Direct supervision/assist for medications management;Direct supervision/assist for financial management;Assist for transportation;Help with stairs or ramp for entrance   Equipment Recommendations  Rollator (4 wheels);BSC/3in1    Recommendations for Other Services       Precautions / Restrictions Precautions Precautions: Fall Precaution Comments: watch BP (orthostatic 4/11), ileostomy Restrictions Weight Bearing Restrictions: No     Mobility  Bed Mobility Overal  bed mobility: Needs Assistance Bed Mobility: Sit to Supine, Supine to Sit     Supine to sit: Supervision, HOB elevated Sit to supine: Supervision, HOB elevated        Transfers Overall transfer level: Needs assistance Equipment used: Rolling walker (2 wheels) Transfers: Sit to/from Stand Sit to Stand: Min guard           General transfer comment: for safety, slow to rise.    Ambulation/Gait Ambulation/Gait assistance: Min guard Gait Distance (Feet): 220 Feet Assistive device: Rolling walker (2 wheels) Gait Pattern/deviations: Step-through pattern, Decreased stride length, Leaning posteriorly Gait velocity: decr Gait velocity interpretation: <1.31 ft/sec, indicative of household ambulator   General Gait Details: close gaurd for safety, cues for placement in RW and upright posture.  Pt tends to walk posteriorly to [employer]             Wheelchair Mobility    Modified Rankin (Stroke Patients Only)       Balance Overall balance assessment: Needs assistance Sitting-balance support: Bilateral upper extremity supported, Single extremity supported, Feet supported Sitting balance-Leahy Scale: Good Sitting balance - Comments: pt sitting EOB   Standing balance support: Bilateral upper extremity supported, During functional activity, Single extremity supported Standing balance-Leahy Scale: Poor Standing balance comment: Reliant on UE support and up to minA                            Cognition Arousal/Alertness: Awake/alert Behavior During Therapy: WFL for tasks assessed/performed Overall Cognitive Status: History of cognitive impairments - at baseline Area of Impairment: Safety/judgement                         Safety/Judgement: Decreased awareness of safety  General Comments: STM deficits at baseline, methodical with ileostomy care and requires very increased time        Exercises      General Comments General comments (skin  integrity, edema, etc.): VSS      Pertinent Vitals/Pain Pain Assessment Pain Assessment: No/denies pain    Home Living                          Prior Function            PT Goals (current goals can now be found in the care plan section) Acute Rehab PT Goals Patient Stated Goal: to improve Progress towards PT goals: Progressing toward goals    Frequency    Min 3X/week      PT Plan Current plan remains appropriate    Co-evaluation              AM-PAC PT "6 Clicks" Mobility   Outcome Measure  Help needed turning from your back to your side while in a flat bed without using bedrails?: A Little Help needed moving from lying on your back to sitting on the side of a flat bed without using bedrails?: A Little Help needed moving to and from a bed to a chair (including a wheelchair)?: A Little Help needed standing up from a chair using your arms (e.g., wheelchair or bedside chair)?: A Little Help needed to walk in hospital room?: A Little Help needed climbing 3-5 steps with a railing? : A Lot 6 Click Score: 17    End of Session Equipment Utilized During Treatment: Gait belt Activity Tolerance: Patient tolerated treatment well Patient left: with call bell/phone within reach;in bed (sitting EOB) Nurse Communication: Mobility status PT Visit Diagnosis: Unsteadiness on feet (R26.81);Other abnormalities of gait and mobility (R26.89);Muscle weakness (generalized) (M62.81);History of falling (Z91.81);Repeated falls (R29.6);Difficulty in walking, not elsewhere classified (R26.2)     Time: 1214-1228 PT Time Calculation (min) (ACUTE ONLY): 14 min  Charges:  $Gait Training: 8-22 mins                     Murial Beam M,PT Acute Rehab Services [phone]    Bevelyn Buckles 11/28/2022, 3:19 PM

## 2022-11-28 NOTE — Discharge Summary (Signed)
Physician Discharge Summary  Jasmine Mccormick TKZ:[id number] DOB: 01-28-1934 DOA: 11/24/2022  PCP: Tresa Garter, MD  Admit date: 11/24/2022 Discharge date: 11/28/2022  Time spent: 55 minutes  Recommendations for Outpatient Follow-up:  Patient be discharged to skilled nursing facility at collapse.  Follow-up with MD at skilled nursing facility.  Patient will need a basic metabolic panel, phosphorus level, magnesium level checked in 1 week to follow-up on electrolytes and renal function.   Discharge Diagnoses:  Principal Problem:   AKI (acute kidney injury) Active Problems:   Dyslipidemia   Essential hypertension   Ulcerative colitis   Hypothyroidism   Mesenteric artery thrombosis   Paroxysmal atrial fibrillation   Memory problem   Near syncope   Metabolic acidosis   Hypophosphatemia   Fall   Discharge Condition: Stable and improved.  Diet recommendation: Regular  Filed Weights   11/27/22 0500 11/28/22 0410  Weight: 62.4 kg 63.3 kg    History of present illness:  HPI per Dr. Sung Amabile is a 89 y.o. female with medical history significant of ulcerative colitis, atrial fibrillation, mesenteric artery thrombosis, memory impairment, hypothyroidism, hypertension, hyperlipidemia presenting after multiple falls at home.   Patient had a fall yesterday and has some injuries on her hand and arm from this. She had another fall today where she felt lightheaded prior to this after standing up after using the bathroom.   Family reports patient has decreased p.o. intake for the last week or so.  She reports she has not had an appetite. Denies nausea nor feeling full.  She states she still tries to have some ice cream in the evenings but otherwise just seems to not have an appetite.  She has reported a little bit of epigastric pain in the ED but otherwise no abdominal pain.   states her ostomy output has been normal.   Patient denies fevers, chills, chest pain,  shortness of breath, constipation, diarrhea, nausea, vomiting.   ED Course: Vital signs in the ED notable for blood pressure in the 160s to 180s systolic, heart rate in the 50s to 60s.  Lab workup included BMP with sodium 128, chloride 91, bicarb 19 with a gap of 18, BUN 81, creatinine elevated to 3.47 from baseline 1.1, glucose 115.  CBC with hemoglobin 16.1.  Urinalysis pending.  CT head and CT C-spine showed no acute abnormality.  No initial interventions thus far in the ED.  Hospital Course:  #1 AKI/near syncope -Patient presented after 2 falls at home, denies any significant syncopal episode. -It is noted per admitting physician that prior to patient's fall she felt lightheaded after standing up from using the bathroom and was walking out when she fell. -Likely secondary to orthostasis, as patient noted to be orthostatic on admission. -Creatinine on admission noted at 3.47 from a baseline of 1.1 in the setting of decreased oral intake. -Urinalysis ordered cloudy, trace leukocytes, nitrite negative, few bacteria, 11-20 WBCs. -Urine sodium at < 10. -Renal ultrasound negative for hydronephrosis. -Patient noted to be acidotic likely secondary to AKI, as such IV fluids have been changed to bicarb drip at 125 cc an hour.   -Acidosis resolved and as such bicarb drip was discontinued and patient placed on IV fluids with normal saline..   -Patient noted to have good urine output.   -Renal function improved with hydration such that by day of discharge creatinine was down to 1.09 from 3.47 on admission.   -Patient will be discharged in stable and improved condition.  2.  Metabolic acidosis -Likely secondary to acute kidney injury. -Acidosis resolved with bicarb drip. -Patient remained afebrile, normal white count. -Urinalysis done was concerning for UTI, while urine cultures were pending patient placed empirically on IV antibiotics and completed 3-day course of treatment. -Outpatient  follow-up..   3.  Paroxysmal atrial fibrillation -Patient remained in normal sinus rhythm during the hospitalization.   -Patient maintained on home regimen amiodarone.  It was also noted that patient's Toprol-XL was recently reduced in dose due to bradycardia and patient was maintained on home regimen.   -Eliquis was continued for anticoagulation.    4.  Probable UTI versus bacteriuria -Urinalysis concerning for UTI. -Urine cultures with insignificant growth.   -Status post 3 days IV Rocephin.     5.  Hypothyroidism -TSH at 1.875.   -Patient maintained on home regimen Synthroid.   -Outpatient follow-up with PCP.   6.  Hyperlipidemia -Patient maintained on home regimen simvastatin, Zetia.     7.  History of ulcerative colitis -Status post remote total colostomy with end ileostomy. -Outpatient follow-up with GI.   8.  History of mesenteric artery thrombosis -Patient maintained on Eliquis with dose adjusted due to presentation with AKI, as AKI improved patient was placed back on home regimen of Eliquis 5 mg twice daily by day of discharge.     9.  Hypertension -Patient maintained on home regimen Toprol-XL as well as IV hydralazine as needed.    10.  Hyponatremia -Likely secondary to hypovolemic hyponatremia. -Urine Sodium < 10. -Resolved with hydration.     11.  Hypokalemia -Repleted during the hospitalization.    12.  Hypophosphatemia -Repleted during the hospitalization.   -Outpatient follow-up.     Procedures: CT head CT C-spine 11/24/2022 Chest x-ray 11/25/2022 Abdominal films 11/25/2022 Renal ultrasound 11/25/2022 2D echo 11/25/2022  Consultations: None  Discharge Exam: Vitals:   11/28/22 0516 11/28/22 0826  BP: (!) 122/55 (!) 162/66  Pulse: 60 66  Resp: 16 18  Temp: 98.4 F (36.9 C) 97.9 F (36.6 C)  SpO2: 98% 97%    General: NAD.  Kyphosis Cardiovascular: Regular rate rhythm no murmurs rubs or gallops.  No JVD.  No lower extremity edema. Respiratory:  Lungs clear to auscultation bilaterally.  No wheezes, no crackles, no rhonchi.  Fair air movement.  Speaking in full sentences.  Discharge Instructions   Discharge Instructions     Diet general   Complete by: As directed    Increase activity slowly   Complete by: As directed       Allergies as of 11/28/2022   No Known Allergies      Medication List     STOP taking these medications    triamcinolone ointment 0.1 % Commonly known as: KENALOG       TAKE these medications    acetaminophen 325 MG tablet Commonly known as: TYLENOL Take 2 tablets (650 mg total) by mouth every 6 (six) hours as needed for mild pain (or Fever >/= 101).   amiodarone 200 MG tablet Commonly known as: PACERONE TAKE 1 TABLET BY MOUTH EVERY DAY   CITRACAL +D3 PO   Eliquis 5 MG Tabs tablet Generic drug: apixaban TAKE 1 TABLET BY MOUTH 2 TIMES DAILY.   ezetimibe 10 MG tablet Commonly known as: ZETIA Take 1 tablet by mouth once daily   levothyroxine 75 MCG tablet Commonly known as: SYNTHROID TAKE 1 TABLET BY MOUTH EVERY DAY   metoprolol succinate 25 MG 24 hr tablet Commonly known as: TOPROL-XL Take  1 tablet (25 mg total) by mouth daily. Take with or immediately following a meal.   omeprazole 20 MG capsule Commonly known as: PRILOSEC Take 1 capsule (20 mg total) by mouth daily.   PRESERVISION AREDS 2 PO Take by mouth 2 (two) times daily.   simvastatin 20 MG tablet Commonly known as: ZOCOR Take 1 tablet (20 mg total) by mouth daily at 6 PM. What changed: when to take this               Durable Medical Equipment  (From admission, onward)           Start     Ordered   11/24/22 1747  For home use only DME 4 wheeled rolling walker with seat  Once       Question:  Patient needs a walker to treat with the following condition  Answer:  Mobility impaired   11/24/22 1746   11/24/22 1747  For home use only DME 3 n 1  Once        11/24/22 1746           No Known  Allergies  Contact information for follow-up providers     MD at SNF Follow up.               Contact information for after-discharge care     Destination     [facility] [place] [facility] .   Service: Skilled Nursing Contact information: [address] [place] [address] [phone]                      The results of significant diagnostics from this hospitalization (including imaging, microbiology, ancillary and laboratory) are listed below for reference.    Significant Diagnostic Studies: ECHOCARDIOGRAM COMPLETE  Result Date: 11/25/2022    ECHOCARDIOGRAM REPORT   Patient Name:   Jasmine Mccormick Date of Exam: 11/25/2022 Medical Rec #:  [id number]          Height:       62.5 in Accession #:    [id number]         Weight:       142.1 lb Date of Birth:  Sep 19, 1933         BSA:          1.663 m Patient Age:    88 years           BP:           160/77 mmHg Patient Gender: F                  HR:           57 bpm. Exam Location:  Inpatient Procedure: 2D Echo, Cardiac Doppler and Color Doppler Indications:    Syncope R55  History:        Patient has prior history of Echocardiogram examinations, most                 recent 03/30/2021. Arrythmias:Atrial Fibrillation,                 Signs/Symptoms:Syncope; Risk Factors:Hypertension and                 Dyslipidemia.  Sonographer:    Lucendia Herrlich Referring Phys: [id number] Lyn Hollingshead B MELVIN IMPRESSIONS  1. Left ventricular ejection fraction, by estimation, is 60 to 65%. The left ventricle has normal function. The left ventricle has no regional wall motion abnormalities. There is  mild left ventricular hypertrophy. Left ventricular diastolic parameters are consistent with Grade I diastolic dysfunction (impaired relaxation).  2. Right ventricular systolic function is normal. The right ventricular size is normal. There is normal pulmonary artery systolic pressure. The estimated right ventricular  systolic pressure is 28.4 mmHg.  3. Left atrial size was moderately dilated.  4. Right atrial size was mildly dilated.  5. The mitral valve is degenerative. Mild mitral valve regurgitation.  6. Aortic valve regurgitation is not visualized. Aortic valve sclerosis/calcification is present, without any evidence of aortic stenosis.  7. The inferior vena cava is normal in size with greater than 50% respiratory variability, suggesting right atrial pressure of 3 mmHg. FINDINGS  Left Ventricle: Left ventricular ejection fraction, by estimation, is 60 to 65%. The left ventricle has normal function. The left ventricle has no regional wall motion abnormalities. The left ventricular internal cavity size was normal in size. There is  mild left ventricular hypertrophy. Left ventricular diastolic parameters are consistent with Grade I diastolic dysfunction (impaired relaxation). Right Ventricle: The right ventricular size is normal. Right ventricular systolic function is normal. There is normal pulmonary artery systolic pressure. The tricuspid regurgitant velocity is 2.52 m/s, and with an assumed right atrial pressure of 3 mmHg,  the estimated right ventricular systolic pressure is 28.4 mmHg. Left Atrium: Left atrial size was moderately dilated. Right Atrium: Right atrial size was mildly dilated. Pericardium: There is no evidence of pericardial effusion. Mitral Valve: The mitral valve is degenerative in appearance. There is moderate calcification of the mitral valve leaflet(s). Mild mitral valve regurgitation. Tricuspid Valve: Tricuspid valve regurgitation is mild. Aortic Valve: Aortic valve regurgitation is not visualized. Aortic valve sclerosis/calcification is present, without any evidence of aortic stenosis. Aortic valve mean gradient measures 9.0 mmHg. Aortic valve peak gradient measures 16.3 mmHg. Aortic valve area, by VTI measures 0.93 cm. Pulmonic Valve: Pulmonic valve regurgitation is not visualized. Aorta: The aortic  root and ascending aorta are structurally normal, with no evidence of dilitation. Venous: The inferior vena cava is normal in size with greater than 50% respiratory variability, suggesting right atrial pressure of 3 mmHg. IAS/Shunts: No atrial level shunt detected by color flow Doppler.  LEFT VENTRICLE PLAX 2D LVIDd:         3.50 cm   Diastology LVIDs:         2.40 cm   LV e' medial:    4.58 cm/s LV PW:         1.30 cm   LV E/e' medial:  14.0 LV IVS:        0.90 cm   LV e' lateral:   4.28 cm/s LVOT diam:     1.80 cm   LV E/e' lateral: 15.0 LV SV:         43 LV SV Index:   26 LVOT Area:     2.54 cm  RIGHT VENTRICLE             IVC RV S prime:     14.40 cm/s  IVC diam: 1.90 cm TAPSE (M-mode): 1.5 cm LEFT ATRIUM             Index        RIGHT ATRIUM           Index LA diam:        4.40 cm 2.65 cm/m   RA Area:     15.90 cm LA Vol (A2C):   53.2 ml 31.96 ml/m  RA Volume:   39.50 ml  23.75 ml/m LA Vol (A4C):   44.5 ml 26.76 ml/m LA Biplane Vol: 53.0 ml 31.87 ml/m  AORTIC VALVE AV Area (Vmax):    1.06 cm AV Area (Vmean):   0.93 cm AV Area (VTI):     0.93 cm AV Vmax:           202.00 cm/s AV Vmean:          144.000 cm/s AV VTI:            0.465 m AV Peak Grad:      16.3 mmHg AV Mean Grad:      9.0 mmHg LVOT Vmax:         84.20 cm/s LVOT Vmean:        52.767 cm/s LVOT VTI:          0.171 m LVOT/AV VTI ratio: 0.37  AORTA Ao Root diam: 2.90 cm Ao Asc diam:  2.90 cm MITRAL VALVE               TRICUSPID VALVE MV Area (PHT): 2.62 cm    TR Peak grad:   25.4 mmHg MV Decel Time: 290 msec    TR Vmax:        252.00 cm/s MV E velocity: 64.00 cm/s MV A velocity: 73.80 cm/s  SHUNTS MV E/A ratio:  0.87        Systemic VTI:  0.17 m                            Systemic Diam: 1.80 cm Carolan Clines Electronically signed by Carolan Clines Signature Date/Time: 11/25/2022/3:33:35 PM    Final    DG Abd 2 Views  Result Date: 11/25/2022 CLINICAL DATA:  Pain EXAM: ABDOMEN - 2 VIEW COMPARISON:  None Available. FINDINGS: The bowel gas pattern  is normal. There is no evidence of free air. No radio-opaque calculi or other significant radiographic abnormality is seen. IMPRESSION: Negative. Electronically Signed   By: Layla Maw M.D.   On: 11/25/2022 09:53   DG Chest 2 View  Result Date: 11/25/2022 CLINICAL DATA:  2672 Acidosis 2672 EXAM: CHEST - 2 VIEW COMPARISON:  03/29/2021. FINDINGS: The heart size and mediastinal contours are within normal limits. Both lungs are clear. No pneumothorax or pleural effusion. There are thoracic degenerative changes. IMPRESSION: No active cardiopulmonary disease. Electronically Signed   By: Layla Maw M.D.   On: 11/25/2022 09:52   US RENAL  Result Date: 11/25/2022 CLINICAL DATA:  [id number] ARF (acute renal failure) [id number] EXAM: RENAL / URINARY TRACT ULTRASOUND COMPLETE COMPARISON:  None Available. FINDINGS: The right kidney measured 10.6 cm and the left kidney measured 9.7 cm. The kidneys demonstrate normal echogenicity. Left kidney cysts identified measuring 1.8 cm this is not need imaging follow up. No shadowing stones are seen. No hydronephrosis. The urinary bladder appeared unremarkable. Bladder volumes were not measured and no postvoid images are submitted. IMPRESSION: Left kidney cyst. Otherwise unremarkable study which was technologically limited. Electronically Signed   By: Layla Maw M.D.   On: 11/25/2022 09:47   CT Head Wo Contrast  Result Date: 11/24/2022 CLINICAL DATA:  Neck trauma (Age >= 65y); Head trauma, minor (Age >= 65y) EXAM: CT HEAD WITHOUT CONTRAST CT CERVICAL SPINE WITHOUT CONTRAST TECHNIQUE: Multidetector CT imaging of the head and cervical spine was performed following the standard protocol without intravenous contrast. Multiplanar CT image reconstructions of the cervical spine were also generated. RADIATION DOSE REDUCTION: This exam was performed  according to the departmental dose-optimization program which includes automated exposure control, adjustment of the mA and/or kV  according to patient size and/or use of iterative reconstruction technique. COMPARISON:  None Available. FINDINGS: CT HEAD FINDINGS Brain: No evidence of acute infarction, hemorrhage, hydrocephalus, extra-axial collection or mass lesion/mass effect. Vascular: No hyperdense vessel.  Calcific atherosclerosis. Skull: No acute fracture. Sinuses/Orbits: No acute finding. Other: Postsurgical changes of right mastoidectomy. No mastoid effusions. CT CERVICAL SPINE FINDINGS Alignment: No substantial sagittal subluxation. Skull base and vertebrae: Vertebral body heights are maintained. No evidence of acute fracture. Osteopenia. Soft tissues and spinal canal: No prevertebral fluid or swelling. No visible canal hematoma. Disc levels:  Multilevel facet and uncovertebral hypertrophy. Upper chest: Visualized lung apices are clear. IMPRESSION: 1. No evidence of acute intracranial abnormality. 2. No evidence of acute fracture or traumatic malalignment. Electronically Signed   By: Feliberto Harts M.D.   On: 11/24/2022 12:05   CT Cervical Spine Wo Contrast  Result Date: 11/24/2022 CLINICAL DATA:  Neck trauma (Age >= 65y); Head trauma, minor (Age >= 65y) EXAM: CT HEAD WITHOUT CONTRAST CT CERVICAL SPINE WITHOUT CONTRAST TECHNIQUE: Multidetector CT imaging of the head and cervical spine was performed following the standard protocol without intravenous contrast. Multiplanar CT image reconstructions of the cervical spine were also generated. RADIATION DOSE REDUCTION: This exam was performed according to the departmental dose-optimization program which includes automated exposure control, adjustment of the mA and/or kV according to patient size and/or use of iterative reconstruction technique. COMPARISON:  None Available. FINDINGS: CT HEAD FINDINGS Brain: No evidence of acute infarction, hemorrhage, hydrocephalus, extra-axial collection or mass lesion/mass effect. Vascular: No hyperdense vessel.  Calcific atherosclerosis. Skull: No  acute fracture. Sinuses/Orbits: No acute finding. Other: Postsurgical changes of right mastoidectomy. No mastoid effusions. CT CERVICAL SPINE FINDINGS Alignment: No substantial sagittal subluxation. Skull base and vertebrae: Vertebral body heights are maintained. No evidence of acute fracture. Osteopenia. Soft tissues and spinal canal: No prevertebral fluid or swelling. No visible canal hematoma. Disc levels:  Multilevel facet and uncovertebral hypertrophy. Upper chest: Visualized lung apices are clear. IMPRESSION: 1. No evidence of acute intracranial abnormality. 2. No evidence of acute fracture or traumatic malalignment. Electronically Signed   By: Feliberto Harts M.D.   On: 11/24/2022 12:05    Microbiology: Recent Results (from the past 240 hour(s))  Urine Culture     Status: Abnormal   Collection Time: 11/25/22  9:15 AM   Specimen: Urine, Catheterized  Result Value Ref Range Status   Specimen Description URINE, CATHETERIZED  Final   Special Requests NONE Reflexed from [id number]  Final   Culture (A)  Final    <10,000 COLONIES/mL INSIGNIFICANT GROWTH Performed at [facility], [address] [place], [place] [address]    Report Status 11/26/2022 FINAL  Final     Labs: Basic Metabolic Panel: Recent Labs  Lab 11/24/22 1242 11/25/22 0503 11/26/22 0330 11/27/22 0456 11/28/22 0404  NA 128* 127* 133* 134* 137  K 4.1 3.6 3.2* 3.6 4.1  CL 91* 99 95* 96* 96*  CO2 19* 14* 23 29 28   GLUCOSE 115* 105* 103* 118* 84  BUN 81* 97* 78* 39* 22  CREATININE 3.47* 3.64* 2.12* 1.29* 1.09*  CALCIUM 8.9 7.9* 8.0* 7.8* 7.6*  MG  --   --  2.1 1.8  --   PHOS  --   --  2.4* 1.9* 3.4   Liver Function Tests: Recent Labs  Lab 11/26/22 0330 11/27/22 0456 11/28/22 0404  ALBUMIN 3.0*  3.1* 2.8*   No results for input(s): "LIPASE", "AMYLASE" in the last 168 hours. No results for input(s): "AMMONIA" in the last 168 hours. CBC: Recent Labs  Lab 11/24/22 1242 11/25/22 0503 11/26/22 0330  11/27/22 0456  WBC 7.8 6.3 5.2 4.4  NEUTROABS 6.4  --  3.6 2.7  HGB 16.1* 15.8* 14.0 13.7  HCT 47.7* 43.8 39.3 39.1  MCV 94.6 90.1 89.9 89.9  PLT 190 200 182 183   Cardiac Enzymes: No results for input(s): "CKTOTAL", "CKMB", "CKMBINDEX", "TROPONINI" in the last 168 hours. BNP: BNP (last 3 results) No results for input(s): "BNP" in the last 8760 hours.  ProBNP (last 3 results) No results for input(s): "PROBNP" in the last 8760 hours.  CBG: Recent Labs  Lab 11/28/22 0336  GLUCAP 77       Signed:  Ramiro Harvest MD.  [facility] 11/28/2022, 12:48 PM

## 2022-11-28 NOTE — Care Management Important Message (Signed)
Important Message  Patient Details  Name: Jasmine Mccormick MRN: [id number] Date of Birth: Dec 07, 1933   Medicare Important Message Given:  Yes     [name] [name] 11/28/2022, 2:58 PM

## 2022-11-28 NOTE — TOC Transition Note (Signed)
Transition of Care [facility]) - CM/SW Discharge Note   Patient Details  Name: Jasmine Mccormick MRN: [id number] Date of Birth: 03-Jul-1934  Transition of Care [facility] CM/SW Contact:  Delilah Shan, LCSWA Phone Number: 11/28/2022, 1:04 PM   Clinical Narrative:     CSW spoke with French Ana with [facility] who confirmed patient can dc over today.  Patient will DC to: [facility]  Anticipated DC date: 11/28/2022  Family notified: Investment banker, operational by: Sharin Mons  ?  Per MD patient ready for DC to [facility] . RN, patient, patient's family, and facility notified of DC. Discharge Summary sent to facility. RN given number for report tele# [phone] RM# [phone]. DC packet on chart. Ambulance transport requested for patient.  CSW signing off.   Final next level of care: Skilled Nursing Facility Barriers to Discharge: No Barriers Identified   Patient Goals and CMS Choice CMS Medicare.gov Compare Post Acute Care list provided to:: Patient Represenative (must comment) Choice offered to / list presented to :  (patients niece)  Discharge Placement                Patient chooses bed at: [facility], [place] Patient to be transferred to facility by: [facility] Name of family member notified: Gavin Pound Patient and family notified of of transfer: 11/28/22  Discharge Plan and Services Additional resources added to the After Visit Summary for                                       Social Determinants of Health (SDOH) Interventions SDOH Screenings   Food Insecurity: No Food Insecurity (11/24/2022)  Housing: Low Risk  (11/24/2022)  Transportation Needs: No Transportation Needs (11/24/2022)  Utilities: Not At Risk (11/24/2022)  Alcohol Screen: Low Risk  (06/15/2022)  Depression (PHQ2-9): Low Risk  (06/15/2022)  Financial Resource Strain: Low Risk  (06/15/2022)  Physical Activity: Sufficiently Active (06/15/2022)  Social Connections: Moderately Integrated (06/15/2022)  Stress: No Stress Concern Present  (06/15/2022)  Tobacco Use: Low Risk  (11/24/2022)     Readmission Risk Interventions     No data to display

## 2022-11-28 NOTE — Progress Notes (Signed)
Patients IV removed at this time, patient tolerated well. Patient wounds cleaned a nd redressed at this time.

## 2022-11-29 DIAGNOSIS — E8721 Acute metabolic acidosis: Secondary | ICD-10-CM | POA: Diagnosis not present

## 2022-11-29 DIAGNOSIS — I10 Essential (primary) hypertension: Secondary | ICD-10-CM | POA: Diagnosis not present

## 2022-11-29 DIAGNOSIS — D6869 Other thrombophilia: Secondary | ICD-10-CM | POA: Diagnosis not present

## 2022-11-29 DIAGNOSIS — I4891 Unspecified atrial fibrillation: Secondary | ICD-10-CM | POA: Diagnosis not present

## 2022-12-07 DIAGNOSIS — N179 Acute kidney failure, unspecified: Secondary | ICD-10-CM | POA: Diagnosis not present

## 2022-12-07 DIAGNOSIS — E785 Hyperlipidemia, unspecified: Secondary | ICD-10-CM | POA: Diagnosis not present

## 2022-12-07 DIAGNOSIS — I10 Essential (primary) hypertension: Secondary | ICD-10-CM | POA: Diagnosis not present

## 2022-12-07 DIAGNOSIS — I499 Cardiac arrhythmia, unspecified: Secondary | ICD-10-CM | POA: Diagnosis not present

## 2022-12-07 DIAGNOSIS — K55069 Acute infarction of intestine, part and extent unspecified: Secondary | ICD-10-CM | POA: Diagnosis not present

## 2022-12-07 DIAGNOSIS — I480 Paroxysmal atrial fibrillation: Secondary | ICD-10-CM | POA: Diagnosis not present

## 2022-12-07 DIAGNOSIS — Z7901 Long term (current) use of anticoagulants: Secondary | ICD-10-CM | POA: Diagnosis not present

## 2022-12-07 DIAGNOSIS — Z932 Ileostomy status: Secondary | ICD-10-CM | POA: Diagnosis not present

## 2022-12-07 DIAGNOSIS — K219 Gastro-esophageal reflux disease without esophagitis: Secondary | ICD-10-CM | POA: Diagnosis not present

## 2022-12-07 DIAGNOSIS — K5190 Ulcerative colitis, unspecified, without complications: Secondary | ICD-10-CM | POA: Diagnosis not present

## 2022-12-07 DIAGNOSIS — E039 Hypothyroidism, unspecified: Secondary | ICD-10-CM | POA: Diagnosis not present

## 2022-12-07 DIAGNOSIS — Z9181 History of falling: Secondary | ICD-10-CM | POA: Diagnosis not present

## 2022-12-14 ENCOUNTER — Inpatient Hospital Stay: Payer: Medicare Other | Admitting: Internal Medicine

## 2022-12-14 ENCOUNTER — Telehealth: Payer: Self-pay | Admitting: Internal Medicine

## 2022-12-14 DIAGNOSIS — K5190 Ulcerative colitis, unspecified, without complications: Secondary | ICD-10-CM | POA: Diagnosis not present

## 2022-12-14 DIAGNOSIS — I499 Cardiac arrhythmia, unspecified: Secondary | ICD-10-CM | POA: Diagnosis not present

## 2022-12-14 DIAGNOSIS — I10 Essential (primary) hypertension: Secondary | ICD-10-CM | POA: Diagnosis not present

## 2022-12-14 DIAGNOSIS — I480 Paroxysmal atrial fibrillation: Secondary | ICD-10-CM | POA: Diagnosis not present

## 2022-12-14 DIAGNOSIS — K55069 Acute infarction of intestine, part and extent unspecified: Secondary | ICD-10-CM | POA: Diagnosis not present

## 2022-12-14 DIAGNOSIS — N179 Acute kidney failure, unspecified: Secondary | ICD-10-CM | POA: Diagnosis not present

## 2022-12-14 NOTE — Telephone Encounter (Signed)
Vernona Rieger from [facility] called and said the patient had a home health evalutation. They have determined that the patient does not need their services.  Best callback for Vernona Rieger is [phone])

## 2022-12-15 ENCOUNTER — Encounter: Payer: Self-pay | Admitting: Internal Medicine

## 2022-12-15 ENCOUNTER — Ambulatory Visit (INDEPENDENT_AMBULATORY_CARE_PROVIDER_SITE_OTHER): Payer: Medicare Other | Admitting: Internal Medicine

## 2022-12-15 VITALS — BP 118/68 | HR 76 | Temp 98.0°F | Ht 62.5 in | Wt 136.0 lb

## 2022-12-15 DIAGNOSIS — N390 Urinary tract infection, site not specified: Secondary | ICD-10-CM | POA: Diagnosis not present

## 2022-12-15 DIAGNOSIS — R7989 Other specified abnormal findings of blood chemistry: Secondary | ICD-10-CM

## 2022-12-15 DIAGNOSIS — E034 Atrophy of thyroid (acquired): Secondary | ICD-10-CM | POA: Diagnosis not present

## 2022-12-15 DIAGNOSIS — N179 Acute kidney failure, unspecified: Secondary | ICD-10-CM

## 2022-12-15 DIAGNOSIS — E559 Vitamin D deficiency, unspecified: Secondary | ICD-10-CM

## 2022-12-15 DIAGNOSIS — I480 Paroxysmal atrial fibrillation: Secondary | ICD-10-CM | POA: Diagnosis not present

## 2022-12-15 DIAGNOSIS — W19XXXA Unspecified fall, initial encounter: Secondary | ICD-10-CM | POA: Diagnosis not present

## 2022-12-15 LAB — CBC WITH DIFFERENTIAL/PLATELET
Basophils Absolute: 0 10*3/uL (ref 0.0–0.1)
Basophils Relative: 0.7 % (ref 0.0–3.0)
Eosinophils Absolute: 0.1 10*3/uL (ref 0.0–0.7)
Eosinophils Relative: 1.5 % (ref 0.0–5.0)
HCT: 40.6 % (ref 36.0–46.0)
Hemoglobin: 13.5 g/dL (ref 12.0–15.0)
Lymphocytes Relative: 13.9 % (ref 12.0–46.0)
Lymphs Abs: 0.8 10*3/uL (ref 0.7–4.0)
MCHC: 33.4 g/dL (ref 30.0–36.0)
MCV: 96.3 fl (ref 78.0–100.0)
Monocytes Absolute: 0.8 10*3/uL (ref 0.1–1.0)
Monocytes Relative: 15.3 % — ABNORMAL HIGH (ref 3.0–12.0)
Neutro Abs: 3.8 10*3/uL (ref 1.4–7.7)
Neutrophils Relative %: 68.6 % (ref 43.0–77.0)
Platelets: 216 10*3/uL (ref 150.0–400.0)
RBC: 4.22 Mil/uL (ref 3.87–5.11)
RDW: 13.6 % (ref 11.5–15.5)
WBC: 5.5 10*3/uL (ref 4.0–10.5)

## 2022-12-15 LAB — URINALYSIS, ROUTINE W REFLEX MICROSCOPIC
Bilirubin Urine: NEGATIVE
Hgb urine dipstick: NEGATIVE
Ketones, ur: NEGATIVE
Nitrite: NEGATIVE
Specific Gravity, Urine: 1.015 (ref 1.000–1.030)
Total Protein, Urine: NEGATIVE
Urine Glucose: NEGATIVE
Urobilinogen, UA: 0.2 (ref 0.0–1.0)
pH: 6.5 (ref 5.0–8.0)

## 2022-12-15 LAB — COMPREHENSIVE METABOLIC PANEL
ALT: 25 U/L (ref 0–35)
AST: 27 U/L (ref 0–37)
Albumin: 3.9 g/dL (ref 3.5–5.2)
Alkaline Phosphatase: 66 U/L (ref 39–117)
BUN: 22 mg/dL (ref 6–23)
CO2: 29 mEq/L (ref 19–32)
Calcium: 10.1 mg/dL (ref 8.4–10.5)
Chloride: 100 mEq/L (ref 96–112)
Creatinine, Ser: 1.21 mg/dL — ABNORMAL HIGH (ref 0.40–1.20)
GFR: 40.57 mL/min — ABNORMAL LOW (ref >60.00)
Glucose, Bld: 67 mg/dL — ABNORMAL LOW (ref 70–99)
Potassium: 4.6 mEq/L (ref 3.5–5.1)
Sodium: 136 mEq/L (ref 135–145)
Total Bilirubin: 0.4 mg/dL (ref 0.2–1.2)
Total Protein: 6.9 g/dL (ref 6.0–8.3)

## 2022-12-15 LAB — VITAMIN D 25 HYDROXY (VIT D DEFICIENCY, FRACTURES): VITD: 24.78 ng/mL — ABNORMAL LOW (ref 30.00–100.00)

## 2022-12-15 LAB — TSH: TSH: 13.73 u[IU]/mL — ABNORMAL HIGH (ref 0.35–5.50)

## 2022-12-15 LAB — VITAMIN B12: Vitamin B-12: 231 pg/mL (ref 211–911)

## 2022-12-15 NOTE — Progress Notes (Signed)
Subjective:  Patient ID: Jasmine Mccormick, female    DOB: [birth year]  Age: 89 y.o. MRN: [id number]  CC: Hospitalization Follow-up (Wound check on arms and bleeding at stoma site)   HPI Jasmine Mccormick presents for a recent fall. Back home from [facility]. She is here w/her niece. F/u on UTI, AKI, dehydration  Per hx:  " Admit date: 11/24/2022 Discharge date: 11/28/2022   Time spent: 55 minutes   Recommendations for Outpatient Follow-up:  Patient be discharged to skilled nursing facility at collapse.  Follow-up with MD at skilled nursing facility.  Patient will need a basic metabolic panel, phosphorus level, magnesium level checked in 1 week to follow-up on electrolytes and renal function.     Discharge Diagnoses:  Principal Problem:   AKI (acute kidney injury) Active Problems:   Dyslipidemia   Essential hypertension   Ulcerative colitis   Hypothyroidism   Mesenteric artery thrombosis   Paroxysmal atrial fibrillation   Memory problem   Near syncope   Metabolic acidosis   Hypophosphatemia   Fall     Discharge Condition: Stable and improved.   Diet recommendation: Regular       Filed Weights    11/27/22 0500 11/28/22 0410  Weight: 62.4 kg 63.3 kg      History of present illness:  HPI per Dr. Sung Amabile is a 89 y.o. female with medical history significant of ulcerative colitis, atrial fibrillation, mesenteric artery thrombosis, memory impairment, hypothyroidism, hypertension, hyperlipidemia presenting after multiple falls at home.   Patient had a fall yesterday and has some injuries on her hand and arm from this. She had another fall today where she felt lightheaded prior to this after standing up after using the bathroom.   Family reports patient has decreased p.o. intake for the last week or so.  She reports she has not had an appetite. Denies nausea nor feeling full.  She states she still tries to have some ice cream in the evenings but  otherwise just seems to not have an appetite.  She has reported a little bit of epigastric pain in the ED but otherwise no abdominal pain.   states her ostomy output has been normal.   Patient denies fevers, chills, chest pain, shortness of breath, constipation, diarrhea, nausea, vomiting.   ED Course: Vital signs in the ED notable for blood pressure in the 160s to 180s systolic, heart rate in the 50s to 60s.  Lab workup included BMP with sodium 128, chloride 91, bicarb 19 with a gap of 18, BUN 81, creatinine elevated to 3.47 from baseline 1.1, glucose 115.  CBC with hemoglobin 16.1.  Urinalysis pending.  CT head and CT C-spine showed no acute abnormality.  No initial interventions thus far in the ED.   Hospital Course:  #1 AKI/near syncope -Patient presented after 2 falls at home, denies any significant syncopal episode. -It is noted per admitting physician that prior to patient's fall she felt lightheaded after standing up from using the bathroom and was walking out when she fell. -Likely secondary to orthostasis, as patient noted to be orthostatic on admission. -Creatinine on admission noted at 3.47 from a baseline of 1.1 in the setting of decreased oral intake. -Urinalysis ordered cloudy, trace leukocytes, nitrite negative, few bacteria, 11-20 WBCs. -Urine sodium at < 10. -Renal ultrasound negative for hydronephrosis. -Patient noted to be acidotic likely secondary to AKI, as such IV fluids have been changed to bicarb drip at 125 cc an hour.   -  Acidosis resolved and as such bicarb drip was discontinued and patient placed on IV fluids with normal saline..   -Patient noted to have good urine output.   -Renal function improved with hydration such that by day of discharge creatinine was down to 1.09 from 3.47 on admission.   -Patient will be discharged in stable and improved condition.    2.  Metabolic acidosis -Likely secondary to acute kidney injury. -Acidosis resolved with bicarb  drip. -Patient remained afebrile, normal white count. -Urinalysis done was concerning for UTI, while urine cultures were pending patient placed empirically on IV antibiotics and completed 3-day course of treatment. -Outpatient follow-up..   3.  Paroxysmal atrial fibrillation -Patient remained in normal sinus rhythm during the hospitalization.   -Patient maintained on home regimen amiodarone.  It was also noted that patient's Toprol-XL was recently reduced in dose due to bradycardia and patient was maintained on home regimen.   -Eliquis was continued for anticoagulation.    4.  Probable UTI versus bacteriuria -Urinalysis concerning for UTI. -Urine cultures with insignificant growth.   -Status post 3 days IV Rocephin.     5.  Hypothyroidism -TSH at 1.875.   -Patient maintained on home regimen Synthroid.   -Outpatient follow-up with PCP.   6.  Hyperlipidemia -Patient maintained on home regimen simvastatin, Zetia.     7.  History of ulcerative colitis -Status post remote total colostomy with end ileostomy. -Outpatient follow-up with GI.   8.  History of mesenteric artery thrombosis -Patient maintained on Eliquis with dose adjusted due to presentation with AKI, as AKI improved patient was placed back on home regimen of Eliquis 5 mg twice daily by day of discharge.     9.  Hypertension -Patient maintained on home regimen Toprol-XL as well as IV hydralazine as needed.    10.  Hyponatremia -Likely secondary to hypovolemic hyponatremia. -Urine Sodium < 10. -Resolved with hydration.     11.  Hypokalemia -Repleted during the hospitalization.    12.  Hypophosphatemia -Repleted during the hospitalization.   -Outpatient follow-up.      Procedures: CT head CT C-spine 11/24/2022 Chest x-ray 11/25/2022 Abdominal films 11/25/2022 Renal ultrasound 11/25/2022 2D echo 11/25/2022"  Outpatient Medications Prior to Visit  Medication Sig Dispense Refill   acetaminophen (TYLENOL) 325 MG  tablet Take 2 tablets (650 mg total) by mouth every 6 (six) hours as needed for mild pain (or Fever >/= 101).     amiodarone (PACERONE) 200 MG tablet TAKE 1 TABLET BY MOUTH EVERY DAY 90 tablet 3   apixaban (ELIQUIS) 5 MG TABS tablet TAKE 1 TABLET BY MOUTH 2 TIMES DAILY. 180 tablet 1   Calcium-Phosphorus-Vitamin D (CITRACAL +D3 PO)      ezetimibe (ZETIA) 10 MG tablet Take 1 tablet by mouth once daily 90 tablet 1   levothyroxine (SYNTHROID) 75 MCG tablet TAKE 1 TABLET BY MOUTH EVERY DAY 90 tablet 1   metoprolol succinate (TOPROL-XL) 25 MG 24 hr tablet Take 1 tablet (25 mg total) by mouth daily. Take with or immediately following a meal. 90 tablet 3   Multiple Vitamins-Minerals (PRESERVISION AREDS 2 PO) Take by mouth 2 (two) times daily.     simvastatin (ZOCOR) 20 MG tablet Take 1 tablet (20 mg total) by mouth daily at 6 PM. (Patient taking differently: Take 20 mg by mouth every evening.) 90 tablet 1   omeprazole (PRILOSEC) 20 MG capsule Take 1 capsule (20 mg total) by mouth daily. (Patient not taking: Reported on 11/24/2022) 90 capsule 3  No facility-administered medications prior to visit.    ROS: Review of Systems  Constitutional:  Positive for fatigue. Negative for activity change, appetite change, chills and unexpected weight change.  HENT:  Negative for congestion, mouth sores and sinus pressure.   Eyes:  Negative for visual disturbance.  Respiratory:  Negative for cough and chest tightness.   Gastrointestinal:  Negative for abdominal pain and nausea.  Genitourinary:  Negative for difficulty urinating, frequency and vaginal pain.  Musculoskeletal:  Positive for gait problem. Negative for back pain.  Skin:  Negative for pallor and rash.  Neurological:  Positive for weakness. Negative for dizziness, tremors, numbness and headaches.  Hematological:  Bruises/bleeds easily.  Psychiatric/Behavioral:  Negative for confusion, decreased concentration, self-injury and sleep disturbance.      Objective:  BP 118/68 (BP Location: Left Arm, Patient Position: Sitting, Cuff Size: Large)   Pulse 76   Temp 98 F (36.7 C) (Oral)   Ht 5' 2.5" (1.588 m)   Wt 136 lb (61.7 kg)   LMP  (LMP Unknown)   SpO2 96%   BMI 24.48 kg/m   BP Readings from Last 3 Encounters:  12/15/22 118/68  11/28/22 (!) 162/66  10/03/22 (!) 172/72    Wt Readings from Last 3 Encounters:  12/15/22 136 lb (61.7 kg)  11/28/22 139 lb 8.8 oz (63.3 kg)  05/11/22 142 lb 1.6 oz (64.5 kg)    Physical Exam Constitutional:      General: She is not in acute distress.    Appearance: She is well-developed.  HENT:     Head: Normocephalic.     Right Ear: External ear normal.     Left Ear: External ear normal.     Nose: Nose normal.  Eyes:     General:        Right eye: No discharge.        Left eye: No discharge.     Conjunctiva/sclera: Conjunctivae normal.     Pupils: Pupils are equal, round, and reactive to light.  Neck:     Thyroid: No thyromegaly.     Vascular: No JVD.     Trachea: No tracheal deviation.  Cardiovascular:     Rate and Rhythm: Normal rate and regular rhythm.     Heart sounds: Normal heart sounds.  Pulmonary:     Effort: No respiratory distress.     Breath sounds: No stridor. No wheezing.  Abdominal:     General: Bowel sounds are normal. There is no distension.     Palpations: Abdomen is soft. There is no mass.     Tenderness: There is no abdominal tenderness. There is no guarding or rebound.  Musculoskeletal:        General: No tenderness.     Cervical back: Normal range of motion and neck supple. No rigidity.  Lymphadenopathy:     Cervical: No cervical adenopathy.  Skin:    Findings: No erythema or rash.  Neurological:     Cranial Nerves: No cranial nerve deficit.     Motor: No abnormal muscle tone.     Coordination: Coordination normal.     Deep Tendon Reflexes: Reflexes normal.  Psychiatric:        Behavior: Behavior normal.        Thought Content: Thought content  normal.        Judgment: Judgment normal.    Using a cane Skin wounds - healing  .  A total time of 45 minutes was spent preparing to see the patient,  reviewing tests, x-rays, operative reports and other medical records.  Also, obtaining history and performing comprehensive physical exam.  Additionally, counseling the patient regarding the above listed issues.   Finally, documenting clinical information in the health records, coordination of care, re-dressing healing wounds on R arm, L hand.   Lab Results  Component Value Date   WBC 4.4 11/27/2022   HGB 13.7 11/27/2022   HCT 39.1 11/27/2022   PLT 183 11/27/2022   GLUCOSE 84 11/28/2022   CHOL 122 04/01/2021   TRIG 119 04/01/2021   HDL 38 (L) 04/01/2021   LDLCALC 60 04/01/2021   ALT 29 04/11/2022   AST 30 04/11/2022   NA 137 11/28/2022   K 4.1 11/28/2022   CL 96 (L) 11/28/2022   CREATININE 1.09 (H) 11/28/2022   BUN 22 11/28/2022   CO2 28 11/28/2022   TSH 1.875 11/25/2022   INR 2.8 11/01/2021   HGBA1C 6.2 09/17/2020    ECHOCARDIOGRAM COMPLETE  Result Date: 11/25/2022    ECHOCARDIOGRAM REPORT   Patient Name:   ADAJA WANDER Thane Date of Exam: 11/25/2022 Medical Rec #:  [id number]          Height:       62.5 in Accession #:    [id number]         Weight:       142.1 lb Date of Birth:  Sep 13, 1933         BSA:          1.663 m Patient Age:    88 years           BP:           160/77 mmHg Patient Gender: F                  HR:           57 bpm. Exam Location:  Inpatient Procedure: 2D Echo, Cardiac Doppler and Color Doppler Indications:    Syncope R55  History:        Patient has prior history of Echocardiogram examinations, most                 recent 03/30/2021. Arrythmias:Atrial Fibrillation,                 Signs/Symptoms:Syncope; Risk Factors:Hypertension and                 Dyslipidemia.  Sonographer:    Lucendia Herrlich Referring Phys: [id number] Lyn Hollingshead B MELVIN IMPRESSIONS  1. Left ventricular ejection fraction, by estimation, is 60 to  65%. The left ventricle has normal function. The left ventricle has no regional wall motion abnormalities. There is mild left ventricular hypertrophy. Left ventricular diastolic parameters are consistent with Grade I diastolic dysfunction (impaired relaxation).  2. Right ventricular systolic function is normal. The right ventricular size is normal. There is normal pulmonary artery systolic pressure. The estimated right ventricular systolic pressure is 28.4 mmHg.  3. Left atrial size was moderately dilated.  4. Right atrial size was mildly dilated.  5. The mitral valve is degenerative. Mild mitral valve regurgitation.  6. Aortic valve regurgitation is not visualized. Aortic valve sclerosis/calcification is present, without any evidence of aortic stenosis.  7. The inferior vena cava is normal in size with greater than 50% respiratory variability, suggesting right atrial pressure of 3 mmHg. FINDINGS  Left Ventricle: Left ventricular ejection fraction, by estimation, is 60 to 65%. The left ventricle has normal function. The left ventricle has no regional wall  motion abnormalities. The left ventricular internal cavity size was normal in size. There is  mild left ventricular hypertrophy. Left ventricular diastolic parameters are consistent with Grade I diastolic dysfunction (impaired relaxation). Right Ventricle: The right ventricular size is normal. Right ventricular systolic function is normal. There is normal pulmonary artery systolic pressure. The tricuspid regurgitant velocity is 2.52 m/s, and with an assumed right atrial pressure of 3 mmHg,  the estimated right ventricular systolic pressure is 28.4 mmHg. Left Atrium: Left atrial size was moderately dilated. Right Atrium: Right atrial size was mildly dilated. Pericardium: There is no evidence of pericardial effusion. Mitral Valve: The mitral valve is degenerative in appearance. There is moderate calcification of the mitral valve leaflet(s). Mild mitral valve  regurgitation. Tricuspid Valve: Tricuspid valve regurgitation is mild. Aortic Valve: Aortic valve regurgitation is not visualized. Aortic valve sclerosis/calcification is present, without any evidence of aortic stenosis. Aortic valve mean gradient measures 9.0 mmHg. Aortic valve peak gradient measures 16.3 mmHg. Aortic valve area, by VTI measures 0.93 cm. Pulmonic Valve: Pulmonic valve regurgitation is not visualized. Aorta: The aortic root and ascending aorta are structurally normal, with no evidence of dilitation. Venous: The inferior vena cava is normal in size with greater than 50% respiratory variability, suggesting right atrial pressure of 3 mmHg. IAS/Shunts: No atrial level shunt detected by color flow Doppler.  LEFT VENTRICLE PLAX 2D LVIDd:         3.50 cm   Diastology LVIDs:         2.40 cm   LV e' medial:    4.58 cm/s LV PW:         1.30 cm   LV E/e' medial:  14.0 LV IVS:        0.90 cm   LV e' lateral:   4.28 cm/s LVOT diam:     1.80 cm   LV E/e' lateral: 15.0 LV SV:         43 LV SV Index:   26 LVOT Area:     2.54 cm  RIGHT VENTRICLE             IVC RV S prime:     14.40 cm/s  IVC diam: 1.90 cm TAPSE (M-mode): 1.5 cm LEFT ATRIUM             Index        RIGHT ATRIUM           Index LA diam:        4.40 cm 2.65 cm/m   RA Area:     15.90 cm LA Vol (A2C):   53.2 ml 31.96 ml/m  RA Volume:   39.50 ml  23.75 ml/m LA Vol (A4C):   44.5 ml 26.76 ml/m LA Biplane Vol: 53.0 ml 31.87 ml/m  AORTIC VALVE AV Area (Vmax):    1.06 cm AV Area (Vmean):   0.93 cm AV Area (VTI):     0.93 cm AV Vmax:           202.00 cm/s AV Vmean:          144.000 cm/s AV VTI:            0.465 m AV Peak Grad:      16.3 mmHg AV Mean Grad:      9.0 mmHg LVOT Vmax:         84.20 cm/s LVOT Vmean:        52.767 cm/s LVOT VTI:          0.171 m  LVOT/AV VTI ratio: 0.37  AORTA Ao Root diam: 2.90 cm Ao Asc diam:  2.90 cm MITRAL VALVE               TRICUSPID VALVE MV Area (PHT): 2.62 cm    TR Peak grad:   25.4 mmHg MV Decel Time: 290 msec     TR Vmax:        252.00 cm/s MV E velocity: 64.00 cm/s MV A velocity: 73.80 cm/s  SHUNTS MV E/A ratio:  0.87        Systemic VTI:  0.17 m                            Systemic Diam: 1.80 cm Carolan Clines Electronically signed by Carolan Clines Signature Date/Time: 11/25/2022/3:33:35 PM    Final    DG Abd 2 Views  Result Date: 11/25/2022 CLINICAL DATA:  Pain EXAM: ABDOMEN - 2 VIEW COMPARISON:  None Available. FINDINGS: The bowel gas pattern is normal. There is no evidence of free air. No radio-opaque calculi or other significant radiographic abnormality is seen. IMPRESSION: Negative. Electronically Signed   By: Layla Maw M.D.   On: 11/25/2022 09:53   DG Chest 2 View  Result Date: 11/25/2022 CLINICAL DATA:  2672 Acidosis 2672 EXAM: CHEST - 2 VIEW COMPARISON:  03/29/2021. FINDINGS: The heart size and mediastinal contours are within normal limits. Both lungs are clear. No pneumothorax or pleural effusion. There are thoracic degenerative changes. IMPRESSION: No active cardiopulmonary disease. Electronically Signed   By: Layla Maw M.D.   On: 11/25/2022 09:52   US RENAL  Result Date: 11/25/2022 CLINICAL DATA:  696295 ARF (acute renal failure) [id number] EXAM: RENAL / URINARY TRACT ULTRASOUND COMPLETE COMPARISON:  None Available. FINDINGS: The right kidney measured 10.6 cm and the left kidney measured 9.7 cm. The kidneys demonstrate normal echogenicity. Left kidney cysts identified measuring 1.8 cm this is not need imaging follow up. No shadowing stones are seen. No hydronephrosis. The urinary bladder appeared unremarkable. Bladder volumes were not measured and no postvoid images are submitted. IMPRESSION: Left kidney cyst. Otherwise unremarkable study which was technologically limited. Electronically Signed   By: Layla Maw M.D.   On: 11/25/2022 09:47   CT Head Wo Contrast  Result Date: 11/24/2022 CLINICAL DATA:  Neck trauma (Age >= 65y); Head trauma, minor (Age >= 65y) EXAM: CT HEAD WITHOUT CONTRAST  CT CERVICAL SPINE WITHOUT CONTRAST TECHNIQUE: Multidetector CT imaging of the head and cervical spine was performed following the standard protocol without intravenous contrast. Multiplanar CT image reconstructions of the cervical spine were also generated. RADIATION DOSE REDUCTION: This exam was performed according to the departmental dose-optimization program which includes automated exposure control, adjustment of the mA and/or kV according to patient size and/or use of iterative reconstruction technique. COMPARISON:  None Available. FINDINGS: CT HEAD FINDINGS Brain: No evidence of acute infarction, hemorrhage, hydrocephalus, extra-axial collection or mass lesion/mass effect. Vascular: No hyperdense vessel.  Calcific atherosclerosis. Skull: No acute fracture. Sinuses/Orbits: No acute finding. Other: Postsurgical changes of right mastoidectomy. No mastoid effusions. CT CERVICAL SPINE FINDINGS Alignment: No substantial sagittal subluxation. Skull base and vertebrae: Vertebral body heights are maintained. No evidence of acute fracture. Osteopenia. Soft tissues and spinal canal: No prevertebral fluid or swelling. No visible canal hematoma. Disc levels:  Multilevel facet and uncovertebral hypertrophy. Upper chest: Visualized lung apices are clear. IMPRESSION: 1. No evidence of acute intracranial abnormality. 2. No evidence of acute fracture or traumatic  malalignment. Electronically Signed   By: Feliberto Harts M.D.   On: 11/24/2022 12:05   CT Cervical Spine Wo Contrast  Result Date: 11/24/2022 CLINICAL DATA:  Neck trauma (Age >= 65y); Head trauma, minor (Age >= 65y) EXAM: CT HEAD WITHOUT CONTRAST CT CERVICAL SPINE WITHOUT CONTRAST TECHNIQUE: Multidetector CT imaging of the head and cervical spine was performed following the standard protocol without intravenous contrast. Multiplanar CT image reconstructions of the cervical spine were also generated. RADIATION DOSE REDUCTION: This exam was performed according to  the departmental dose-optimization program which includes automated exposure control, adjustment of the mA and/or kV according to patient size and/or use of iterative reconstruction technique. COMPARISON:  None Available. FINDINGS: CT HEAD FINDINGS Brain: No evidence of acute infarction, hemorrhage, hydrocephalus, extra-axial collection or mass lesion/mass effect. Vascular: No hyperdense vessel.  Calcific atherosclerosis. Skull: No acute fracture. Sinuses/Orbits: No acute finding. Other: Postsurgical changes of right mastoidectomy. No mastoid effusions. CT CERVICAL SPINE FINDINGS Alignment: No substantial sagittal subluxation. Skull base and vertebrae: Vertebral body heights are maintained. No evidence of acute fracture. Osteopenia. Soft tissues and spinal canal: No prevertebral fluid or swelling. No visible canal hematoma. Disc levels:  Multilevel facet and uncovertebral hypertrophy. Upper chest: Visualized lung apices are clear. IMPRESSION: 1. No evidence of acute intracranial abnormality. 2. No evidence of acute fracture or traumatic malalignment. Electronically Signed   By: Feliberto Harts M.D.   On: 11/24/2022 12:05    Assessment & Plan:   Problem List Items Addressed This Visit     Hypothyroidism    Chronic On Levothroid Check TSH      Relevant Orders   Comprehensive metabolic panel   CBC with Differential/Platelet   TSH   Vitamin B12   VITAMIN D 25 Hydroxy (Vit-D Deficiency, Fractures)   Urinalysis   Low vitamin B12 level - Primary     Cont on B12 vitamin      Relevant Orders   Vitamin B12   UTI (urinary tract infection)    Recent Check UA, recheck for metabolic abnormalities      Paroxysmal atrial fibrillation (HCC)    F/u w/Dr Cristal Deer - pending      Vitamin D deficiency   Relevant Orders   VITAMIN D 25 Hydroxy (Vit-D Deficiency, Fractures)   AKI (acute kidney injury) (HCC)    Hydrate well Monitor GFR      Fall    No relapse      Relevant Orders    Comprehensive metabolic panel   CBC with Differential/Platelet   Urinalysis      No orders of the defined types were placed in this encounter.     Follow-up: Return in about 3 months (around 03/17/2023) for a follow-up visit.  Sonda Primes, MD

## 2022-12-15 NOTE — Assessment & Plan Note (Signed)
Hydrate well ?Monitor GFR ?

## 2022-12-15 NOTE — Assessment & Plan Note (Signed)
Cont on B12 vitamin

## 2022-12-15 NOTE — Assessment & Plan Note (Signed)
No relapse 

## 2022-12-15 NOTE — Assessment & Plan Note (Signed)
F/u w/Dr Cristal Deer - pending

## 2022-12-15 NOTE — Assessment & Plan Note (Signed)
Chronic On Levothroid Check TSH

## 2022-12-15 NOTE — Assessment & Plan Note (Addendum)
Recent Check UA, recheck for metabolic abnormalities

## 2022-12-16 NOTE — Telephone Encounter (Signed)
Noted. Thanks.

## 2022-12-19 ENCOUNTER — Encounter (HOSPITAL_BASED_OUTPATIENT_CLINIC_OR_DEPARTMENT_OTHER): Payer: Self-pay | Admitting: Cardiology

## 2022-12-19 ENCOUNTER — Ambulatory Visit (INDEPENDENT_AMBULATORY_CARE_PROVIDER_SITE_OTHER): Payer: Medicare Other | Admitting: Cardiology

## 2022-12-19 VITALS — BP 134/70 | HR 48 | Ht 62.5 in | Wt 137.4 lb

## 2022-12-19 DIAGNOSIS — D6869 Other thrombophilia: Secondary | ICD-10-CM | POA: Diagnosis not present

## 2022-12-19 DIAGNOSIS — Z7901 Long term (current) use of anticoagulants: Secondary | ICD-10-CM | POA: Diagnosis not present

## 2022-12-19 DIAGNOSIS — I480 Paroxysmal atrial fibrillation: Secondary | ICD-10-CM

## 2022-12-19 DIAGNOSIS — E7800 Pure hypercholesterolemia, unspecified: Secondary | ICD-10-CM

## 2022-12-19 DIAGNOSIS — Z09 Encounter for follow-up examination after completed treatment for conditions other than malignant neoplasm: Secondary | ICD-10-CM | POA: Diagnosis not present

## 2022-12-19 NOTE — Patient Instructions (Addendum)
Medication Instructions:  We will stop the metoprolol today. Continue the amiodarone. If you feel that the heart rate is fast, call and let us know.  *If you need a refill on your cardiac medications before your next appointment, please call your pharmacy*  Lab Work: NONE  Testing/Procedures: NONE  Follow-Up: At [facility], you and your health needs are our priority.  As part of our continuing mission to provide you with exceptional heart care, we have created designated Provider Care Teams.  These Care Teams include your primary Cardiologist (physician) and Advanced Practice Providers (APPs -  Physician Assistants and Nurse Practitioners) who all work together to provide you with the care you need, when you need it.  We recommend signing up for the patient portal called "MyChart".  Sign up information is provided on this After Visit Summary.  MyChart is used to connect with patients for Virtual Visits (Telemedicine).  Patients are able to view lab/test results, encounter notes, upcoming appointments, etc.  Non-urgent messages can be sent to your provider as well.   To learn more about what you can do with MyChart, go to [email or web address].    Your next appointment:   6 month(s)  The format for your next appointment:   In Person  Provider:   Jodelle Red, MD

## 2022-12-19 NOTE — Progress Notes (Signed)
Cardiology Office Note:    Date:  12/19/2022   ID:  Jasmine Mccormick, DOB 15-Jan-1934, MRN [id number]  PCP:  Jasmine Garter, MD  Cardiologist:  Jasmine Red, MD  Referring MD: Jasmine Garter, MD   CC: follow up  History of Present Illness:    Jasmine Mccormick is a 89 y.o. female with a complex history, including ulcerative colitis s/p colectomy with ileostomy placement >20 years ago, HTN, and hypothyroidism, notable for distal SMA occlusion s/p thrombectomy, here for follow up.   Cardiac History: Found to have new atrial fibrillation during hospitalization 03/2021. She underwent cardioversion on 06/15/2021. She converted to NSR at 74 bpm. However, sinus rhythm did not hold. She was started on amiodarone. Repeat  cardioversion 10/2021.   Today: Hospitalized last month for illness (GI vs GU) with acute kidney injury, falls likely 2/2 dehydration as she was orthostatic on presentation. Discussed hydration at length, she is working on this.  Initial blood pressure elevated today, recheck improved.  Has been sleeping a lot, but generally feels unchanged. Discussed metoprolol today.  TSH abnormal, awaiting word from Dr. Posey Rea if synthroid dose needs adjusted.  Denies chest pain, shortness of breath at rest or with normal exertion. No PND, orthopnea, LE edema or unexpected weight gain. No syncope or palpitations.    Past Medical History:  Diagnosis Date   COVID 12/18/2020   mild   HTN (hypertension)    Normal at home   Hyperlipidemia    Hypothyroidism    Ileostomy in place [facility])    Memory loss    mild but not dx by doctor per [facility]   Meniere disease 1996   Osteoporosis    Persistent atrial fibrillation (HCC)    UC (ulcerative colitis) (HCC)     Past Surgical History:  Procedure Laterality Date   CARDIOVERSION N/A 06/15/2021   Procedure: CARDIOVERSION;  Surgeon: Christell Constant, MD;  Location: [facility] ENDOSCOPY;  Service: Cardiovascular;   Laterality: N/A;   CARDIOVERSION N/A 10/26/2021   Procedure: CARDIOVERSION;  Surgeon: Jasmine Red, MD;  Location: [facility] ENDOSCOPY;  Service: Cardiovascular;  Laterality: N/A;   COLECTOMY     COLONOSCOPY     Ear Shunt for Menier's     ENDARTERECTOMY MESENTERIC Right 03/29/2021   Procedure: MESENTERIC ARTERY THROMBECTOMY;  Surgeon: Nada Libman, MD;  Location: [facility] OR;  Service: Vascular;  Laterality: Right;   ILEOSTOMY     INTRAOPERATIVE ARTERIOGRAM Right 03/29/2021   Procedure: INTRA OPERATIVE AORTOGRAM;  Surgeon: Nada Libman, MD;  Location: [facility] OR;  Service: Vascular;  Laterality: Right;   KYPHOPLASTY N/A 01/07/2021   Procedure: Lumbar three Kyphoplasty;  Surgeon: Dawley, Alan Mulder, DO;  Location: [facility] OR;  Service: Neurosurgery;  Laterality: N/A;   PROCTECTOMY     ULTRASOUND GUIDANCE FOR VASCULAR ACCESS Right 03/29/2021   Procedure: ULTRASOUND GUIDANCE FOR VASCULAR ACCESS;  Surgeon: Nada Libman, MD;  Location: [facility] OR;  Service: Vascular;  Laterality: Right;    Current Medications: Current Outpatient Medications on File Prior to Visit  Medication Sig   acetaminophen (TYLENOL) 325 MG tablet Take 2 tablets (650 mg total) by mouth every 6 (six) hours as needed for mild pain (or Fever >/= 101).   amiodarone (PACERONE) 200 MG tablet TAKE 1 TABLET BY MOUTH EVERY DAY   apixaban (ELIQUIS) 5 MG TABS tablet TAKE 1 TABLET BY MOUTH 2 TIMES DAILY.   Calcium-Phosphorus-Vitamin D (CITRACAL +D3 PO)    ezetimibe (ZETIA) 10 MG tablet Take 1 tablet by  mouth once daily   levothyroxine (SYNTHROID) 75 MCG tablet TAKE 1 TABLET BY MOUTH EVERY DAY   Multiple Vitamins-Minerals (PRESERVISION AREDS 2 PO) Take by mouth 2 (two) times daily.   simvastatin (ZOCOR) 20 MG tablet Take 1 tablet (20 mg total) by mouth daily at 6 PM. (Patient taking differently: Take 20 mg by mouth every evening.)   No current facility-administered medications on file prior to visit.     Allergies:   Patient has no known  allergies.   Social History   Tobacco Use   Smoking status: Never   Smokeless tobacco: Never  Vaping Use   Vaping Use: Never used  Substance Use Topics   Alcohol use: No   Drug use: No    Family History: family history includes Breast cancer in her sister; Heart disease in an other family member; Hypertension in an other family member.  ROS:   Please see the history of present illness.   Additional pertinent ROS otherwise unremarkable.   EKGs/Labs/Other Studies Reviewed:    The following studies were reviewed today: Echo 11/25/22 1. Left ventricular ejection fraction, by estimation, is 60 to 65%. The  left ventricle has normal function. The left ventricle has no regional  wall motion abnormalities. There is mild left ventricular hypertrophy.  Left ventricular diastolic parameters  are consistent with Grade I diastolic dysfunction (impaired relaxation).   2. Right ventricular systolic function is normal. The right ventricular  size is normal. There is normal pulmonary artery systolic pressure. The  estimated right ventricular systolic pressure is 28.4 mmHg.   3. Left atrial size was moderately dilated.   4. Right atrial size was mildly dilated.   5. The mitral valve is degenerative. Mild mitral valve regurgitation.   6. Aortic valve regurgitation is not visualized. Aortic valve  sclerosis/calcification is present, without any evidence of aortic  stenosis.   7. The inferior vena cava is normal in size with greater than 50%  respiratory variability, suggesting right atrial pressure of 3 mmHg.   Echocardiogram 03/30/21:  1. Patient in rapid afib during study.   2. Left ventricular ejection fraction, by estimation, is 60 to 65%. The  left ventricle has normal function. The left ventricle has no regional  wall motion abnormalities. Left ventricular diastolic parameters are  indeterminate.   3. Right ventricular systolic function is normal. The right ventricular  size is  normal. There is normal pulmonary artery systolic pressure.   4. Left atrial size was moderately dilated.   5. Right atrial size was moderately dilated.   6. The mitral valve is degenerative. Trivial mitral valve regurgitation.  No evidence of mitral stenosis. Moderate mitral annular calcification.   7. The aortic valve is tricuspid. Aortic valve regurgitation is not  visualized. Mild to moderate aortic valve sclerosis/calcification is  present, without any evidence of aortic stenosis.   8. The inferior vena cava is normal in size with greater than 50%  respiratory variability, suggesting right atrial pressure of 3 mmHg.  CTA Abdomen/Pelvis 03/29/2021: IMPRESSION: VASCULAR   1. 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 2. Moderate to marked severity diffuse calcification and atherosclerosis throughout the remaining arterial structures within the abdomen and pelvis.   NON-VASCULAR   1. 2 mm nonobstructing left renal stone. 2. Postoperative changes with a subsequent right lower quadrant ostomy site. 3. Large hiatal hernia.  EKG:  EKG is personally reviewed.   12/19/22: not ordered today 05/11/22: sinus bradycardia at 52 bpm 11/10/2021:  NSR at 64 bpm 10/08/2021: atrial fibrillation at 86 bpm 08/24/2021: EKG was not ordered. 07/01/21: afib RVR at 116 bpm 04/15/21: afib RVR at 109 bpm  Recent Labs: 11/27/2022: Magnesium 1.8 12/15/2022: ALT 25; BUN 22; Creatinine, Ser 1.21; Hemoglobin 13.5; Platelets 216.0; Potassium 4.6; Sodium 136; TSH 13.73   Recent Lipid Panel    Component Value Date/Time   CHOL 122 04/01/2021 0410   TRIG 119 04/01/2021 0410   HDL 38 (L) 04/01/2021 0410   CHOLHDL 3.2 04/01/2021 0410   VLDL 24 04/01/2021 0410   LDLCALC 60 04/01/2021 0410    Physical Exam:    VS:  BP 134/70   Pulse (!) 48   Ht 5' 2.5" (1.588 m)   Wt 137 lb 6.4 oz (62.3 kg)   LMP  (LMP Unknown)   SpO2 98%   BMI 24.73 kg/m     Wt Readings from  Last 3 Encounters:  12/19/22 137 lb 6.4 oz (62.3 kg)  12/15/22 136 lb (61.7 kg)  11/28/22 139 lb 8.8 oz (63.3 kg)    GEN: Well nourished, well developed in no acute distress HEENT: Normal, moist mucous membranes NECK: No JVD CARDIAC: regular rhythm, normal S1 and S2, no rubs or gallops. 1/6 systolic murmur. VASCULAR: Radial and DP pulses 2+ bilaterally. No carotid bruits RESPIRATORY:  Clear to auscultation without rales, wheezing or rhonchi  ABDOMEN: Soft, non-tender, non-distended MUSCULOSKELETAL:  Ambulates independently SKIN: Warm and dry, no edema NEUROLOGIC:  Alert and oriented x 3. No focal neuro deficits noted. PSYCHIATRIC:  Normal affect    ASSESSMENT:    1. Paroxysmal atrial fibrillation (HCC)   2. Secondary hypercoagulable state (HCC)   3. Current use of long term anticoagulation   4. Pure hypercholesterolemia   5. Hospital discharge follow-up    PLAN:    Recent hospitalization follow up -reviewed at length. Feeling much better. Reviewed importance of hydration.  Paroxysmal atrial fibrillation -tolerating amiodarone, in SR today -had sinus brady after cardioversion, changed metoprolol from 75 mg BID to 50 mg daily, then decreased to 25 mg daily. Still bradycardic, will stop today -continue  apixaban, meets age but not weight or Cr for dose reduction (brief Cr bump while in hospital for AKI) -chadsvasc at least 4  LE edema, intermittent: -none today -likely component of both diastolic dysfunction and chronic venous insufficiency -Doesn't tolerate compression stockings or prolonged elevation per patient.    Hypercholesterolemia: ok to continue simvastatin given age  Cardiac risk counseling and prevention recommendations: -recommend heart healthy/Mediterranean diet, with whole grains, fruits, vegetable, fish, lean meats, nuts, and olive oil. Limit salt. -recommend moderate walking, 3-5 times/week for 30-50 minutes each session. Aim for at least 150 minutes.week.  Goal should be pace of 3 miles/hours, or walking 1.5 miles in 30 minutes -recommend avoidance of tobacco products. Avoid excess alcohol.  Plan for follow up: 6 months.  Jasmine Red, MD, PhD, [facility]  [facility]    Medication Adjustments/Labs and Tests Ordered: Current medicines are reviewed at length with the patient today.  Concerns regarding medicines are outlined above.   No orders of the defined types were placed in this encounter.  No orders of the defined types were placed in this encounter.  Patient Instructions  Medication Instructions:  We will stop the metoprolol today. Continue the amiodarone. If you feel that the heart rate is fast, call and let us know.  *If you need a refill on your cardiac medications before your next appointment, please call your pharmacy*  Lab Work: NONE  Testing/Procedures: NONE  Follow-Up: At [facility], you and your health needs are our priority.  As part of our continuing mission to provide you with exceptional heart care, we have created designated Provider Care Teams.  These Care Teams include your primary Cardiologist (physician) and Advanced Practice Providers (APPs -  Physician Assistants and Nurse Practitioners) who all work together to provide you with the care you need, when you need it.  We recommend signing up for the patient portal called "MyChart".  Sign up information is provided on this After Visit Summary.  MyChart is used to connect with patients for Virtual Visits (Telemedicine).  Patients are able to view lab/test results, encounter notes, upcoming appointments, etc.  Non-urgent messages can be sent to your provider as well.   To learn more about what you can do with MyChart, go to [email or web address].    Your next appointment:   6 month(s)  The format for your next appointment:   In Person  Provider:   Jodelle Red, MD        Signed, Jasmine Red, MD  PhD 12/19/2022     [facility]

## 2022-12-20 ENCOUNTER — Encounter: Payer: Self-pay | Admitting: Internal Medicine

## 2022-12-20 ENCOUNTER — Other Ambulatory Visit: Payer: Self-pay | Admitting: Internal Medicine

## 2022-12-20 DIAGNOSIS — I10 Essential (primary) hypertension: Secondary | ICD-10-CM | POA: Diagnosis not present

## 2022-12-20 DIAGNOSIS — K55069 Acute infarction of intestine, part and extent unspecified: Secondary | ICD-10-CM | POA: Diagnosis not present

## 2022-12-20 DIAGNOSIS — I499 Cardiac arrhythmia, unspecified: Secondary | ICD-10-CM | POA: Diagnosis not present

## 2022-12-20 DIAGNOSIS — N179 Acute kidney failure, unspecified: Secondary | ICD-10-CM | POA: Diagnosis not present

## 2022-12-20 DIAGNOSIS — K5190 Ulcerative colitis, unspecified, without complications: Secondary | ICD-10-CM | POA: Diagnosis not present

## 2022-12-20 DIAGNOSIS — I480 Paroxysmal atrial fibrillation: Secondary | ICD-10-CM | POA: Diagnosis not present

## 2022-12-20 MED ORDER — NITROFURANTOIN MONOHYD MACRO 100 MG PO CAPS: 100.0000 mg | ORAL_CAPSULE | Freq: Two times a day (BID) | ORAL | 0 refills | Status: AC

## 2022-12-20 MED ORDER — VITAMIN D (ERGOCALCIFEROL) 1.25 MG (50000 UNIT) PO CAPS: 50000.0000 [IU] | ORAL_CAPSULE | ORAL | 0 refills | Status: DC

## 2022-12-20 MED ORDER — VITAMIN D3 50 MCG (2000 UT) PO CAPS: 2000.0000 [IU] | ORAL_CAPSULE | Freq: Every day | ORAL | 3 refills | Status: AC

## 2022-12-20 MED ORDER — VITAMIN B-12 1000 MCG SL SUBL: 1.0000 | SUBLINGUAL_TABLET | Freq: Every day | SUBLINGUAL | 3 refills | Status: AC

## 2022-12-20 MED ORDER — LEVOTHYROXINE SODIUM 88 MCG PO TABS: 88.0000 ug | ORAL_TABLET | Freq: Every day | ORAL | 3 refills | Status: DC

## 2022-12-27 DIAGNOSIS — K5190 Ulcerative colitis, unspecified, without complications: Secondary | ICD-10-CM | POA: Diagnosis not present

## 2022-12-27 DIAGNOSIS — I10 Essential (primary) hypertension: Secondary | ICD-10-CM | POA: Diagnosis not present

## 2022-12-27 DIAGNOSIS — I480 Paroxysmal atrial fibrillation: Secondary | ICD-10-CM | POA: Diagnosis not present

## 2022-12-27 DIAGNOSIS — N179 Acute kidney failure, unspecified: Secondary | ICD-10-CM | POA: Diagnosis not present

## 2022-12-27 DIAGNOSIS — I499 Cardiac arrhythmia, unspecified: Secondary | ICD-10-CM | POA: Diagnosis not present

## 2022-12-27 DIAGNOSIS — K55069 Acute infarction of intestine, part and extent unspecified: Secondary | ICD-10-CM | POA: Diagnosis not present

## 2022-12-28 DIAGNOSIS — H353122 Nonexudative age-related macular degeneration, left eye, intermediate dry stage: Secondary | ICD-10-CM | POA: Diagnosis not present

## 2023-01-06 DIAGNOSIS — K55069 Acute infarction of intestine, part and extent unspecified: Secondary | ICD-10-CM | POA: Diagnosis not present

## 2023-01-06 DIAGNOSIS — I499 Cardiac arrhythmia, unspecified: Secondary | ICD-10-CM | POA: Diagnosis not present

## 2023-01-06 DIAGNOSIS — Z932 Ileostomy status: Secondary | ICD-10-CM | POA: Diagnosis not present

## 2023-01-06 DIAGNOSIS — Z9181 History of falling: Secondary | ICD-10-CM | POA: Diagnosis not present

## 2023-01-06 DIAGNOSIS — K5190 Ulcerative colitis, unspecified, without complications: Secondary | ICD-10-CM | POA: Diagnosis not present

## 2023-01-06 DIAGNOSIS — I10 Essential (primary) hypertension: Secondary | ICD-10-CM | POA: Diagnosis not present

## 2023-01-06 DIAGNOSIS — Z7901 Long term (current) use of anticoagulants: Secondary | ICD-10-CM | POA: Diagnosis not present

## 2023-01-06 DIAGNOSIS — N179 Acute kidney failure, unspecified: Secondary | ICD-10-CM | POA: Diagnosis not present

## 2023-01-06 DIAGNOSIS — I480 Paroxysmal atrial fibrillation: Secondary | ICD-10-CM | POA: Diagnosis not present

## 2023-01-06 DIAGNOSIS — E785 Hyperlipidemia, unspecified: Secondary | ICD-10-CM | POA: Diagnosis not present

## 2023-01-06 DIAGNOSIS — E039 Hypothyroidism, unspecified: Secondary | ICD-10-CM | POA: Diagnosis not present

## 2023-01-06 DIAGNOSIS — K219 Gastro-esophageal reflux disease without esophagitis: Secondary | ICD-10-CM | POA: Diagnosis not present

## 2023-01-09 ENCOUNTER — Encounter (HOSPITAL_BASED_OUTPATIENT_CLINIC_OR_DEPARTMENT_OTHER): Payer: Self-pay

## 2023-01-10 DIAGNOSIS — N179 Acute kidney failure, unspecified: Secondary | ICD-10-CM | POA: Diagnosis not present

## 2023-01-10 DIAGNOSIS — I499 Cardiac arrhythmia, unspecified: Secondary | ICD-10-CM | POA: Diagnosis not present

## 2023-01-10 DIAGNOSIS — I480 Paroxysmal atrial fibrillation: Secondary | ICD-10-CM | POA: Diagnosis not present

## 2023-01-10 DIAGNOSIS — I10 Essential (primary) hypertension: Secondary | ICD-10-CM | POA: Diagnosis not present

## 2023-01-10 DIAGNOSIS — K5190 Ulcerative colitis, unspecified, without complications: Secondary | ICD-10-CM | POA: Diagnosis not present

## 2023-01-10 DIAGNOSIS — K55069 Acute infarction of intestine, part and extent unspecified: Secondary | ICD-10-CM | POA: Diagnosis not present

## 2023-01-10 NOTE — Telephone Encounter (Signed)
Updated log as requested

## 2023-01-18 ENCOUNTER — Ambulatory Visit (INDEPENDENT_AMBULATORY_CARE_PROVIDER_SITE_OTHER): Payer: Medicare Other | Admitting: Podiatry

## 2023-01-18 ENCOUNTER — Encounter: Payer: Self-pay | Admitting: Podiatry

## 2023-01-18 VITALS — BP 178/61

## 2023-01-18 DIAGNOSIS — M79674 Pain in right toe(s): Secondary | ICD-10-CM

## 2023-01-18 DIAGNOSIS — L84 Corns and callosities: Secondary | ICD-10-CM | POA: Diagnosis not present

## 2023-01-18 DIAGNOSIS — B351 Tinea unguium: Secondary | ICD-10-CM | POA: Diagnosis not present

## 2023-01-18 DIAGNOSIS — D689 Coagulation defect, unspecified: Secondary | ICD-10-CM

## 2023-01-18 DIAGNOSIS — M79675 Pain in left toe(s): Secondary | ICD-10-CM

## 2023-01-23 NOTE — Progress Notes (Unsigned)
  Subjective:  Patient ID: Jasmine Mccormick, female    DOB: 05/22/1934,  MRN: [id number]  ARIS MOMAN presents to clinic today for corn(s) R 5th toe and painful thick toenails that are difficult to trim. Painful toenails interfere with ambulation. Aggravating factors include wearing enclosed shoe gear. Pain is relieved with periodic professional debridement. Painful corns are aggravated when weightbearing when wearing enclosed shoe gear. Pain is relieved with periodic professional debridement. Chief Complaint  Patient presents with   Nail Problem    RFC,Referring Provider Plotnikov, Georgina Quint, MD,LOV:05/24      New problem(s): None.   PCP is Plotnikov, Georgina Quint, MD.  No Known Allergies  Review of Systems: Negative except as noted in the HPI. Objective:   Constitutional Jasmine Mccormick is a pleasant 89 y.o. female, WD, WN in NAD. AAO x 3.   Vascular Vascular Examination: Capillary refill time immediate b/l. Vascular status intact b/l with palpable pedal pulses. Pedal hair present ***. No edema. No pain with calf compression b/l. Skin temperature gradient WNL b/l. No cyanosis or clubbing b/l. {jgvascular:23595}  Neurological Examination: Sensation grossly intact b/l with 10 gram monofilament. Vibratory sensation intact b/l. {jgneuro:23601::"Protective sensation intact 5/5 intact bilaterally with 10g monofilament b/l.","Vibratory sensation intact b/l.","Proprioception intact bilaterally."}  Dermatological Examination: Pedal skin with normal turgor, texture and tone b/l.  No open wounds. No interdigital macerations.   Toenails 1-5 b/l thick, discolored, elongated with subungual debris and pain on dorsal palpation.   {jgderm:23598}  Musculoskeletal Examination: {jgmsk:23600}  Radiographs: None  Last A1c:       No data to display             Assessment:   1. Pain due to onychomycosis of toenails of both feet    Plan:  Patient was evaluated and treated and  all questions answered. Consent given for treatment as described below: {jgplan:23602::"-Patient/POA to call should there be question/concern in the interim."}  Return in about 3 months (around 04/20/2023).  Freddie Breech, DPM

## 2023-01-26 DIAGNOSIS — I499 Cardiac arrhythmia, unspecified: Secondary | ICD-10-CM | POA: Diagnosis not present

## 2023-01-26 DIAGNOSIS — I10 Essential (primary) hypertension: Secondary | ICD-10-CM | POA: Diagnosis not present

## 2023-01-26 DIAGNOSIS — K55069 Acute infarction of intestine, part and extent unspecified: Secondary | ICD-10-CM | POA: Diagnosis not present

## 2023-01-26 DIAGNOSIS — I480 Paroxysmal atrial fibrillation: Secondary | ICD-10-CM | POA: Diagnosis not present

## 2023-01-26 DIAGNOSIS — N179 Acute kidney failure, unspecified: Secondary | ICD-10-CM | POA: Diagnosis not present

## 2023-01-26 DIAGNOSIS — K5190 Ulcerative colitis, unspecified, without complications: Secondary | ICD-10-CM | POA: Diagnosis not present

## 2023-01-27 ENCOUNTER — Other Ambulatory Visit: Payer: Self-pay | Admitting: Internal Medicine

## 2023-02-04 IMAGING — DX DG LUMBAR SPINE COMPLETE 4+V
5 series · 5 of 5 positions shown · non-contrast
Comparison: None.

CLINICAL DATA: Low back pain for 2 weeks following heavy lifting,
initial encounter

EXAM:
LUMBAR SPINE - COMPLETE 4+ VIEW

[l-spine ap]
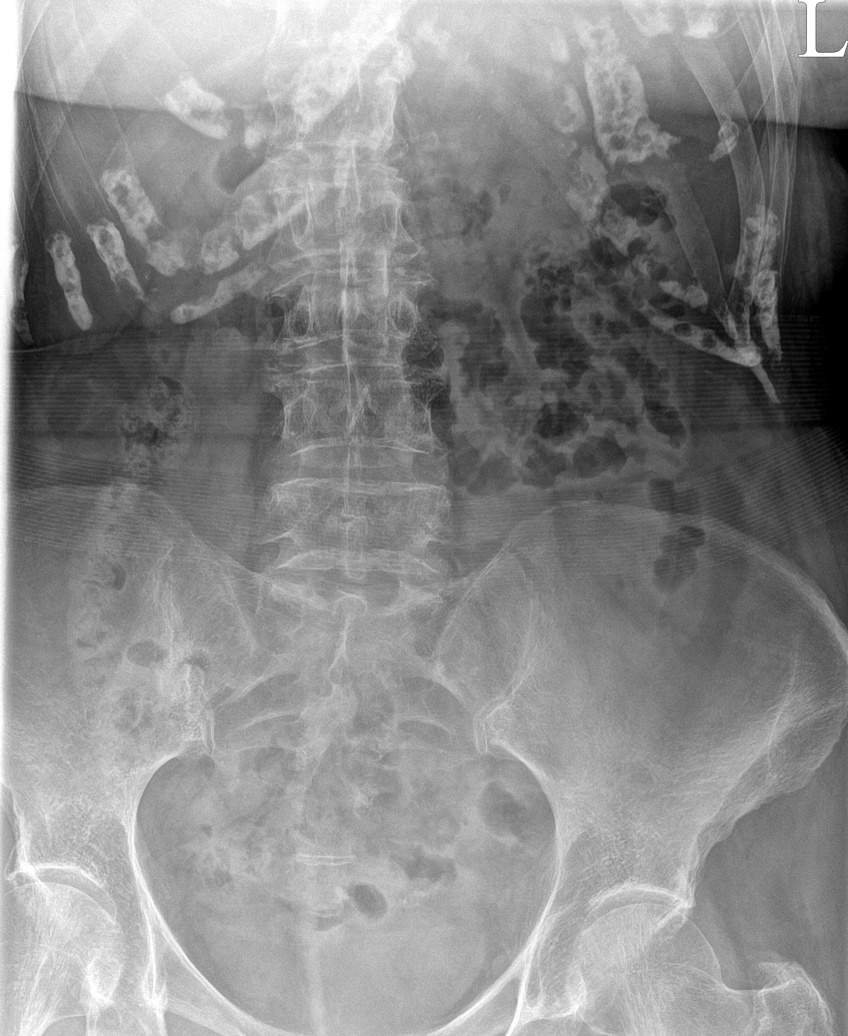

[l-spine obl (1 of 2)]
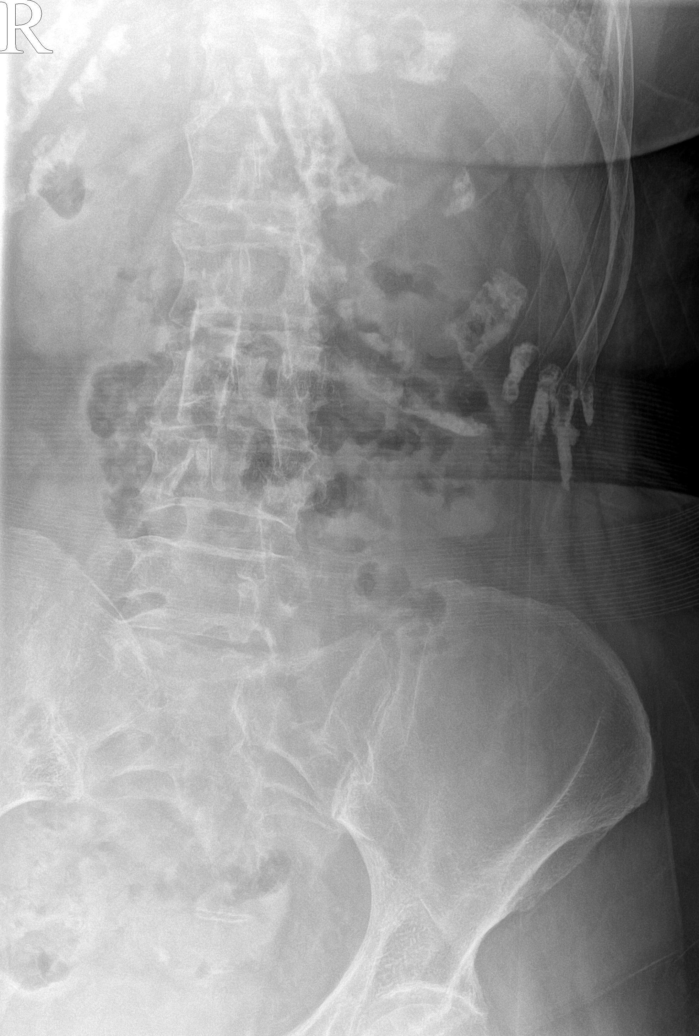

[l-spine obl (2 of 2)]
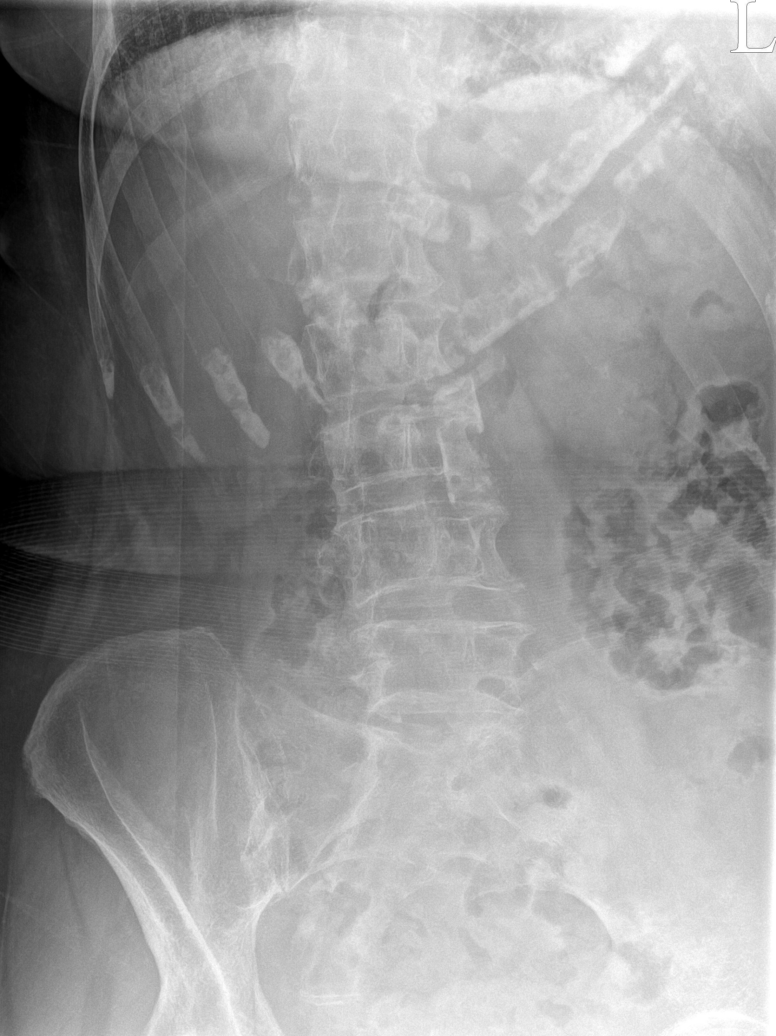

[l-spine lateral]
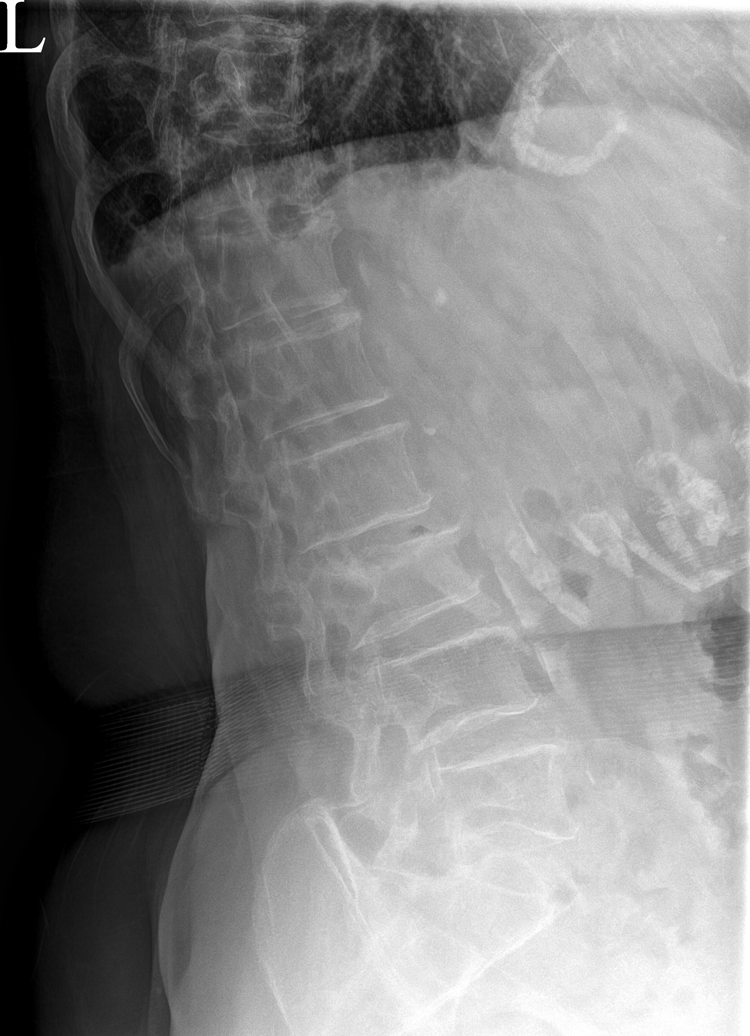

[l-spine spot]
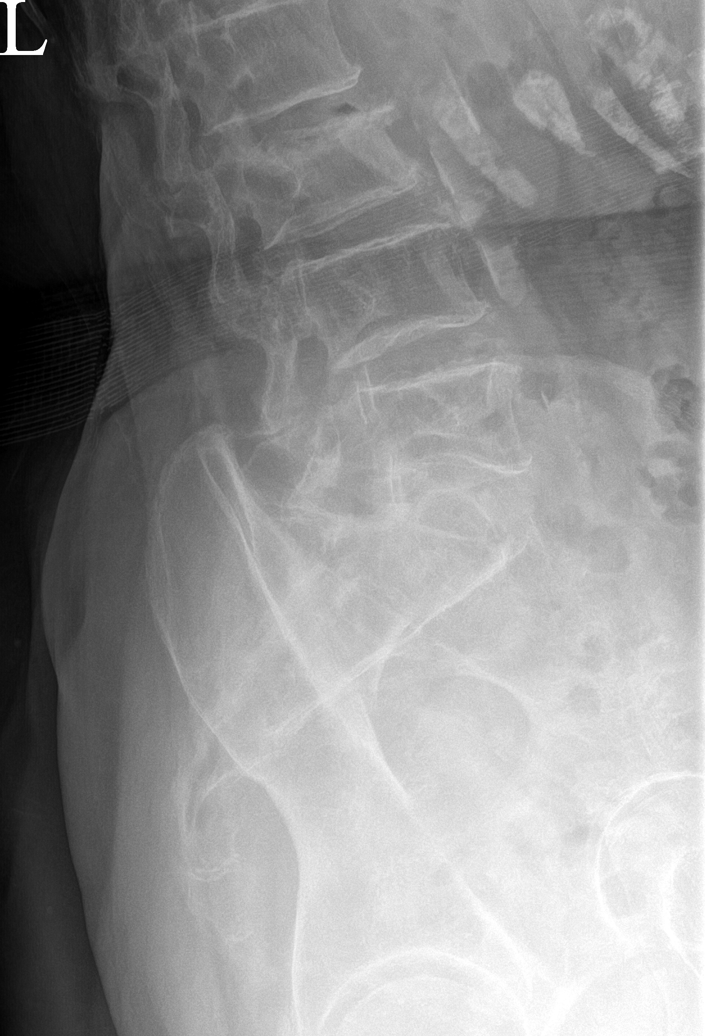

[5 of 5 positions shown; findings below may reference images not displayed]

FINDINGS: Five lumbar type vertebral bodies are well visualized. Multilevel
osteophytic changes are noted. Mild height loss is noted at L3 of
uncertain chronicity. Mild osteopenia is noted. No pars defects are
seen.
IMPRESSION: L3 compression deformity of a mild degree. This is of uncertain
chronicity. MRI would be helpful for further evaluation as
clinically indicated.

## 2023-02-27 ENCOUNTER — Other Ambulatory Visit (HOSPITAL_BASED_OUTPATIENT_CLINIC_OR_DEPARTMENT_OTHER): Payer: Self-pay | Admitting: Cardiology

## 2023-02-27 DIAGNOSIS — I4819 Other persistent atrial fibrillation: Secondary | ICD-10-CM

## 2023-02-28 NOTE — Telephone Encounter (Signed)
Prescription refill request for Eliquis received. Indication:afib Last office visit:5/24 Scr:1.21  5/24 Age: 89 Weight:62.3  kg  Prescription refilled

## 2023-04-01 IMAGING — RF DG C-ARM 1-60 MIN
2 series · 2 of 2 positions shown · non-contrast
Comparison: 12/31/2020

CLINICAL DATA: L3 kyphoplasty

EXAM:
LUMBAR SPINE - 2-3 VIEW; DG C-ARM 1-60 MIN

[Series 1: run · 1 of 1 slices shown (1 of 2)]
[im 1/1]
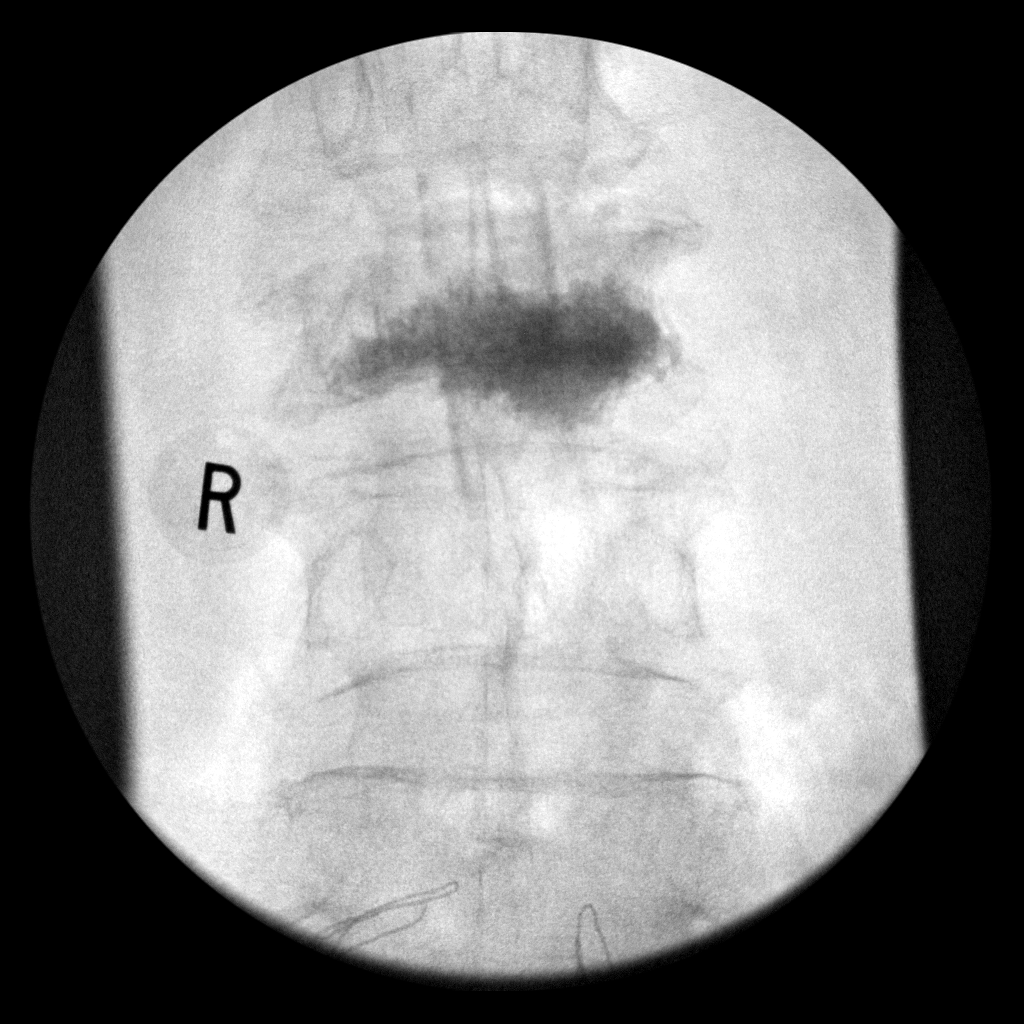

[Series 1: run · 1 of 1 slices shown (2 of 2)]
[im 1/1]
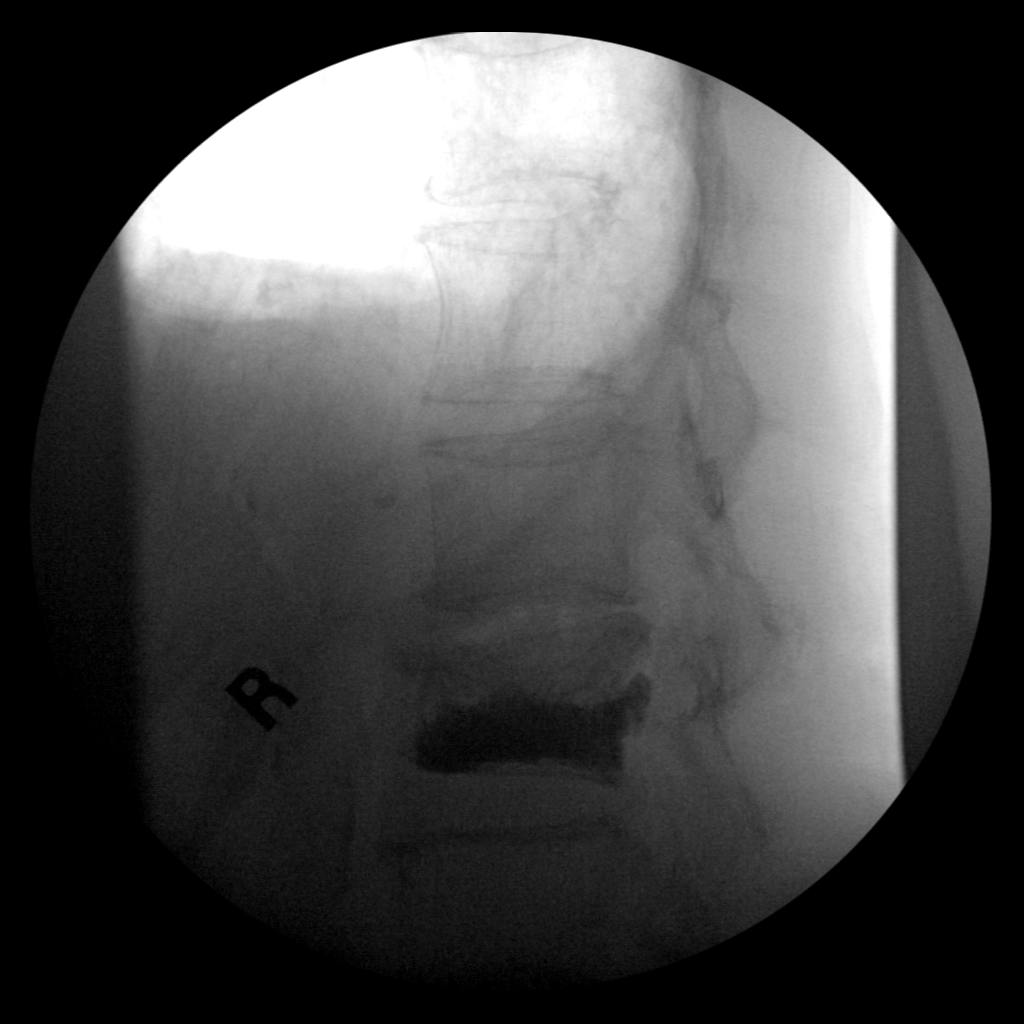

[2 of 2 positions shown; findings below may reference images not displayed]

FLUOROSCOPY TIME:  Fluoroscopy Time:  1 minutes 36 seconds

Radiation Exposure Index (if provided by the fluoroscopic device):
39.85 mGy

Number of Acquired Spot Images: 2
FINDINGS: Two spot films were obtained and reveal contrast laden cement within
the L3 vertebral body. Stable compression deformity is noted.
IMPRESSION: L3 kyphoplasty.

## 2023-04-17 ENCOUNTER — Other Ambulatory Visit: Payer: Self-pay | Admitting: Internal Medicine

## 2023-04-26 ENCOUNTER — Encounter: Payer: Self-pay | Admitting: Internal Medicine

## 2023-04-26 DIAGNOSIS — Z23 Encounter for immunization: Secondary | ICD-10-CM | POA: Diagnosis not present

## 2023-05-01 ENCOUNTER — Ambulatory Visit: Payer: Medicare Other | Admitting: Podiatry

## 2023-05-08 ENCOUNTER — Encounter: Payer: Self-pay | Admitting: Podiatry

## 2023-05-08 ENCOUNTER — Ambulatory Visit (INDEPENDENT_AMBULATORY_CARE_PROVIDER_SITE_OTHER): Payer: Medicare Other | Admitting: Podiatry

## 2023-05-08 VITALS — BP 193/70 | HR 53

## 2023-05-08 DIAGNOSIS — L84 Corns and callosities: Secondary | ICD-10-CM | POA: Diagnosis not present

## 2023-05-08 DIAGNOSIS — D689 Coagulation defect, unspecified: Secondary | ICD-10-CM

## 2023-05-08 DIAGNOSIS — B351 Tinea unguium: Secondary | ICD-10-CM | POA: Diagnosis not present

## 2023-05-08 DIAGNOSIS — M79674 Pain in right toe(s): Secondary | ICD-10-CM | POA: Diagnosis not present

## 2023-05-08 DIAGNOSIS — M79675 Pain in left toe(s): Secondary | ICD-10-CM | POA: Diagnosis not present

## 2023-05-09 ENCOUNTER — Encounter: Payer: Self-pay | Admitting: Podiatry

## 2023-05-09 NOTE — Progress Notes (Signed)
Subjective:  Patient ID: Jasmine Mccormick, female    DOB: 1934/08/05,  MRN: [id number]  89 y.o. female presents at risk foot care with h/o clotting disorder and corn(s) right foot and painful thick toenails that are difficult to trim. Painful toenails interfere with ambulation. Aggravating factors include wearing enclosed shoe gear. Pain is relieved with periodic professional debridement. Painful corns are aggravated when weightbearing when wearing enclosed shoe gear. Pain is relieved with periodic professional debridement.  Chief Complaint  Patient presents with   Nail Problem    "Clip her toenails and check that toe that Dr. Eloy End looked at before."    New problem(s): None   PCP is Plotnikov, Jasmine Quint, MD.  No Known Allergies  Review of Systems: Negative except as noted in the HPI.   Objective:  GEORGIANNA Mccormick is a pleasant 89 y.o. female WD, WN in NAD.[name] AAO x 3.  Vascular Examination: Vascular status intact b/l with palpable pedal pulses. CFT immediate b/l. Pedal hair decreased. No edema. No pain with calf compression b/l. Skin temperature gradient WNL b/l. No varicosities noted. No cyanosis or clubbing noted.  Neurological Examination: Sensation grossly intact b/l with 10 gram monofilament. Vibratory sensation intact b/l.  Dermatological Examination: Pedal skin with normal turgor, texture and tone b/l.  No open wounds. No interdigital macerations.   Toenails 1-5 b/l thick, discolored, elongated with subungual debris and pain on dorsal palpation.   Hyperkeratotic lesion(s) R 5th toe.  No erythema, no edema, no drainage, no fluctuance.  Musculoskeletal Examination: Normal muscle strength 5/5 to all lower extremity muscle groups bilaterally. Adductovarus deformity right fifth digit. No pain, crepitus or joint limitation noted with ROM b/l LE.  Patient ambulates with cane assistance.  Radiographs: None  Assessment:   1. Pain due to onychomycosis of toenails of both  feet   2. Corns   3. Clotting disorder (HCC)    Plan:  -Patient was evaluated and treated. All patient's and/or POA's questions/concerns answered on today's visit. -Patient's family member present. All questions/concerns addressed on today's visit. -Patient to continue soft, supportive shoe gear daily. -Mycotic toenails 1-5 bilaterally were debrided in length and girth with sterile nail nippers and dremel without incident. -Corn(s) right fifth digit pared utilizing sterile scalpel blade without complication or incident. Total number debrided=1. -Patient/POA to call should there be question/concern in the interim.  Return in about 3 months (around 08/07/2023).  Freddie Breech, DPM

## 2023-05-13 ENCOUNTER — Other Ambulatory Visit (HOSPITAL_BASED_OUTPATIENT_CLINIC_OR_DEPARTMENT_OTHER): Payer: Self-pay | Admitting: Cardiology

## 2023-05-13 DIAGNOSIS — I480 Paroxysmal atrial fibrillation: Secondary | ICD-10-CM

## 2023-06-23 ENCOUNTER — Ambulatory Visit (HOSPITAL_BASED_OUTPATIENT_CLINIC_OR_DEPARTMENT_OTHER): Payer: Medicare Other | Admitting: Cardiology

## 2023-06-23 ENCOUNTER — Encounter (HOSPITAL_BASED_OUTPATIENT_CLINIC_OR_DEPARTMENT_OTHER): Payer: Self-pay | Admitting: Cardiology

## 2023-06-23 VITALS — BP 122/64 | HR 53 | Ht 62.5 in | Wt 135.0 lb

## 2023-06-23 DIAGNOSIS — I480 Paroxysmal atrial fibrillation: Secondary | ICD-10-CM

## 2023-06-23 DIAGNOSIS — D6869 Other thrombophilia: Secondary | ICD-10-CM | POA: Diagnosis not present

## 2023-06-23 DIAGNOSIS — R0989 Other specified symptoms and signs involving the circulatory and respiratory systems: Secondary | ICD-10-CM | POA: Diagnosis not present

## 2023-06-23 DIAGNOSIS — Z7901 Long term (current) use of anticoagulants: Secondary | ICD-10-CM | POA: Diagnosis not present

## 2023-06-23 DIAGNOSIS — E7800 Pure hypercholesterolemia, unspecified: Secondary | ICD-10-CM

## 2023-06-23 NOTE — Progress Notes (Signed)
  Cardiology Office Note:  .   Date:  06/23/2023  ID:  [name], DOB 03/05/1934, MRN [id number] PCP: Tresa Garter, MD  [facility] Providers Cardiologist:  Jodelle Red, MD {  History of Present Illness: .   Jasmine Mccormick is a 89 y.o. female with a complex history, including ulcerative colitis s/p colectomy with ileostomy placement >20 years ago, HTN, and hypothyroidism, notable for distal SMA occlusion s/p thrombectomy, here for follow up.    Cardiac History: Found to have new atrial fibrillation during hospitalization 03/2021. She underwent cardioversion on 06/15/2021. She converted to NSR at 74 bpm. However, sinus rhythm did not hold. She was started on amiodarone. Repeat  cardioversion 10/2021.   Today: Brings home BP log today. Reviewed, range 112/56-181/75. Most 120-140 systolic. Taking medications regularly. Not on chronic antihypertensives.  ROS: Denies chest pain, shortness of breath at rest or with normal exertion. No PND, orthopnea, LE edema or unexpected weight gain. No syncope or palpitations. ROS otherwise negative except as noted.   Studies Reviewed: [name]    EKG:       Physical Exam:   VS:  BP 122/64   Pulse (!) 53   Ht 5' 2.5" (1.588 m)   Wt 135 lb (61.2 kg)   LMP  (LMP Unknown)   BMI 24.30 kg/m    Wt Readings from Last 3 Encounters:  06/23/23 135 lb (61.2 kg)  12/19/22 137 lb 6.4 oz (62.3 kg)  12/15/22 136 lb (61.7 kg)    GEN: Well nourished, well developed in no acute distress HEENT: Normal, moist mucous membranes NECK: No JVD CARDIAC: regular rhythm, normal S1 and S2, no rubs or gallops. 1/6 systolic murmur. VASCULAR: Radial and DP pulses 2+ bilaterally. No carotid bruits RESPIRATORY:  Clear to auscultation without rales, wheezing or rhonchi  ABDOMEN: Soft, non-tender, non-distended MUSCULOSKELETAL:  Ambulates independently SKIN: Warm and dry, no edema NEUROLOGIC:  Alert and oriented x 3. No focal neuro deficits  noted. PSYCHIATRIC:  Normal affect    ASSESSMENT AND PLAN: .    Paroxysmal atrial fibrillation -tolerating amiodarone, in SR today -metoprolol stopped due to bradycardia -continue  apixaban, meets age but not weight or Cr for dose reduction (brief Cr bump while in hospital for AKI but now normalized) -chadsvasc at least 4   LE edema, intermittent: -none today -likely component of both diastolic dysfunction and chronic venous insufficiency -Doesn't tolerate compression stockings or prolonged elevation per patient.    Hypercholesterolemia: ok to continue simvastatin given age  Labile blood pressure: -after shared decision making, will hold on adding meds at this time -discussed amlodipine low dose, but she has a history of leg swelling and wishes to avoid swelling as a side effect  Dispo: 6 mos or sooner as needed  Signed, Jodelle Red, MD   Jodelle Red, MD, PhD, [facility] [place]  [facility]  [place]  [facility] at [facility] at [facility] [address] [place], [place] [address] [phone]

## 2023-06-23 NOTE — Patient Instructions (Signed)

## 2023-07-27 ENCOUNTER — Ambulatory Visit (INDEPENDENT_AMBULATORY_CARE_PROVIDER_SITE_OTHER): Payer: Medicare Other

## 2023-07-27 ENCOUNTER — Encounter: Payer: Self-pay | Admitting: Podiatry

## 2023-07-27 ENCOUNTER — Ambulatory Visit (INDEPENDENT_AMBULATORY_CARE_PROVIDER_SITE_OTHER): Payer: Medicare Other | Admitting: Podiatry

## 2023-07-27 ENCOUNTER — Other Ambulatory Visit: Payer: Self-pay | Admitting: Internal Medicine

## 2023-07-27 DIAGNOSIS — B351 Tinea unguium: Secondary | ICD-10-CM

## 2023-07-27 DIAGNOSIS — M79674 Pain in right toe(s): Secondary | ICD-10-CM | POA: Diagnosis not present

## 2023-07-27 DIAGNOSIS — D689 Coagulation defect, unspecified: Secondary | ICD-10-CM

## 2023-07-27 DIAGNOSIS — M79672 Pain in left foot: Secondary | ICD-10-CM

## 2023-07-27 DIAGNOSIS — M79675 Pain in left toe(s): Secondary | ICD-10-CM | POA: Diagnosis not present

## 2023-07-27 DIAGNOSIS — L84 Corns and callosities: Secondary | ICD-10-CM | POA: Diagnosis not present

## 2023-07-27 NOTE — Progress Notes (Signed)
Subjective:   Patient ID: Jasmine Mccormick, female   DOB: 89 y.o.   MRN: [id number]   HPI Patient presents with lesions on the fifth digits of both feet and chronic nail disease 1-5 both feet thickened thick that she has been not able to take care of with caregiver   ROS      Objective:  Physical Exam  Neurovascular status unchanged with diminishment of sharp dull vibratory with patient on blood thinner with thick yellow brittle nailbeds 1-5 both feet that are tender when palpated and keratotic lesion digit 5 bilateral that are painful     Assessment:  Chronic lesion formation fifth digit bilateral along with mycotic nail infection     Plan:  H&P reviewed and reviewed risks associated with blood thinner debrided lesions carefully bilateral no iatrogenic bleeding and debrided nailbeds 1-5 both feet no iatrogenic bleeding reappoint routine care

## 2023-08-02 ENCOUNTER — Encounter: Payer: Self-pay | Admitting: Internal Medicine

## 2023-08-02 ENCOUNTER — Other Ambulatory Visit: Payer: Self-pay | Admitting: Internal Medicine

## 2023-08-05 ENCOUNTER — Other Ambulatory Visit: Payer: Self-pay | Admitting: Internal Medicine

## 2023-08-05 MED ORDER — EZETIMIBE 10 MG PO TABS: 10.0000 mg | ORAL_TABLET | Freq: Every day | ORAL | 2 refills | Status: AC

## 2023-08-05 MED ORDER — SIMVASTATIN 20 MG PO TABS: 20.0000 mg | ORAL_TABLET | Freq: Every day | ORAL | 2 refills | Status: AC

## 2023-08-23 ENCOUNTER — Ambulatory Visit: Payer: Medicare Other | Admitting: Podiatry

## 2023-08-25 ENCOUNTER — Other Ambulatory Visit (HOSPITAL_BASED_OUTPATIENT_CLINIC_OR_DEPARTMENT_OTHER): Payer: Self-pay | Admitting: Cardiology

## 2023-08-25 DIAGNOSIS — I4819 Other persistent atrial fibrillation: Secondary | ICD-10-CM

## 2023-08-25 NOTE — Telephone Encounter (Signed)
 Prescription refill request for Eliquis received. Indication: Afib  Last office visit: 06/23/23 Cristal Deer)  Scr: 1.21 (12/15/22)  Age: 89 Weight: 61.2kg  Appropriate dose. Refill sent.

## 2023-10-09 ENCOUNTER — Ambulatory Visit (INDEPENDENT_AMBULATORY_CARE_PROVIDER_SITE_OTHER): Payer: Medicare Other

## 2023-10-09 VITALS — BP 140/78 | HR 59 | Ht 62.5 in | Wt 131.0 lb

## 2023-10-09 DIAGNOSIS — Z0000 Encounter for general adult medical examination without abnormal findings: Secondary | ICD-10-CM | POA: Diagnosis not present

## 2023-10-09 NOTE — Patient Instructions (Signed)
 Jasmine Mccormick , Thank you for taking time to come for your Medicare Wellness Visit. I appreciate your ongoing commitment to your health goals. Please review the following plan we discussed and let me know if I can assist you in the future.   Referrals/Orders/Follow-Ups/Clinician Recommendations: It was nice talking to you today.  Each day, aim for 6 glasses of water, plenty of protein in your diet and try to get up and walk/ stretch every hour for 5-10 minutes at a time.    This is a list of the screening recommended for you and due dates:  Health Maintenance  Topic Date Due   Zoster (Shingles) Vaccine (2 of 2) 11/24/2016   DTaP/Tdap/Td vaccine (2 - Tdap) 07/04/2022   COVID-19 Vaccine (4 - 2024-25 season) 04/16/2023   Medicare Annual Wellness Visit  10/08/2024   Pneumonia Vaccine  Completed   Flu Shot  Completed   DEXA scan (bone density measurement)  Completed   HPV Vaccine  Aged Out    Advanced directives: (In Chart) A copy of your advanced directives are scanned into your chart should your provider ever need it.  Next Medicare Annual Wellness Visit scheduled for next year: Yes

## 2023-10-09 NOTE — Progress Notes (Signed)
 Subjective:   Jasmine Mccormick is a 89 y.o. female who presents for Medicare Annual (Subsequent) preventive examination.  Visit Complete: Virtual I connected with  [name] on 10/09/23 by a audio enabled telemedicine application and verified that I am speaking with the correct person using two identifiers.  Patient Location: Home  Provider Location: Home Office  I discussed the limitations of evaluation and management by telemedicine. The patient expressed understanding and agreed to proceed.  Vital Signs: Because this visit was a virtual/telehealth visit, some criteria may be missing or patient reported. Any vitals not documented were not able to be obtained and vitals that have been documented are patient reported.   Cardiac Risk Factors include: advanced age (>62men, >17 women);hypertension     Objective:    Today's Vitals   10/09/23 1352  BP: (!) 140/78  Pulse: (!) 59  SpO2: 95%  Weight: 131 lb (59.4 kg)  Height: 5' 2.5" (1.588 m)   Body mass index is 23.58 kg/m.     10/09/2023    1:57 PM 11/24/2022    4:00 PM 06/15/2022   11:35 AM 11/13/2021    2:59 PM 10/26/2021    8:45 AM 06/15/2021    6:54 AM 03/28/2021   10:44 PM  Advanced Directives  Does Patient Have a Medical Advance Directive? Yes Yes Yes No Yes Yes No;Yes  Type of Estate agent of [place];Living will Living will;Healthcare Power of [employer] Power of [place];Living will  Healthcare Power of [place];Living will Healthcare Power of [place];Living will   Does patient want to make changes to medical advance directive? No - Patient declined No - Patient declined       Copy of Healthcare Power of Attorney in Chart? Yes - validated most recent copy scanned in chart (See row information) No - copy requested No - copy requested No - copy available, Physician notified Yes - validated most recent copy scanned in chart (See row information)    Would patient like information on  creating a medical advance directive?       Yes (ED - Information included in AVS)    Current Medications (verified) Outpatient Encounter Medications as of 10/09/2023  Medication Sig   acetaminophen (TYLENOL) 325 MG tablet Take 2 tablets (650 mg total) by mouth every 6 (six) hours as needed for mild pain (or Fever >/= 101).   amiodarone (PACERONE) 200 MG tablet TAKE 1 TABLET BY MOUTH EVERY DAY   Calcium-Phosphorus-Vitamin D (CITRACAL +D3 PO)    Cholecalciferol (VITAMIN D3) 50 MCG (2000 UT) capsule Take 1 capsule (2,000 Units total) by mouth daily.   Cyanocobalamin (VITAMIN B-12) 1000 MCG SUBL Place 1 tablet (1,000 mcg total) under the tongue daily.   ELIQUIS 5 MG TABS tablet TAKE 1 TABLET BY MOUTH 2 TIMES DAILY.   ezetimibe (ZETIA) 10 MG tablet Take 1 tablet (10 mg total) by mouth daily.   levothyroxine (SYNTHROID) 88 MCG tablet Take 1 tablet (88 mcg total) by mouth daily.   Multiple Vitamins-Minerals (PRESERVISION AREDS 2 PO) Take by mouth 2 (two) times daily.   simvastatin (ZOCOR) 20 MG tablet Take 1 tablet (20 mg total) by mouth daily.   No facility-administered encounter medications on file as of 10/09/2023.    Allergies (verified) Patient has no known allergies.   History: Past Medical History:  Diagnosis Date   COVID 12/18/2020   mild   HTN (hypertension)    Normal at home   Hyperlipidemia    Hypothyroidism  Ileostomy in place [facility])    Memory loss    mild but not dx by doctor per [place]   Meniere disease 1996   Osteoporosis    Persistent atrial fibrillation (HCC)    UC (ulcerative colitis) (HCC)    Past Surgical History:  Procedure Laterality Date   CARDIOVERSION N/A 06/15/2021   Procedure: CARDIOVERSION;  Surgeon: Christell Constant, MD;  Location: [facility] ENDOSCOPY;  Service: Cardiovascular;  Laterality: N/A;   CARDIOVERSION N/A 10/26/2021   Procedure: CARDIOVERSION;  Surgeon: Jodelle Red, MD;  Location: [facility] ENDOSCOPY;  Service: Cardiovascular;  Laterality:  N/A;   COLECTOMY     COLONOSCOPY     Ear Shunt for Menier's     ENDARTERECTOMY MESENTERIC Right 03/29/2021   Procedure: MESENTERIC ARTERY THROMBECTOMY;  Surgeon: Nada Libman, MD;  Location: [facility] OR;  Service: Vascular;  Laterality: Right;   ILEOSTOMY     INTRAOPERATIVE ARTERIOGRAM Right 03/29/2021   Procedure: INTRA OPERATIVE AORTOGRAM;  Surgeon: Nada Libman, MD;  Location: [facility] OR;  Service: Vascular;  Laterality: Right;   KYPHOPLASTY N/A 01/07/2021   Procedure: Lumbar three Kyphoplasty;  Surgeon: Dawley, Alan Mulder, DO;  Location: [facility] OR;  Service: Neurosurgery;  Laterality: N/A;   PROCTECTOMY     ULTRASOUND GUIDANCE FOR VASCULAR ACCESS Right 03/29/2021   Procedure: ULTRASOUND GUIDANCE FOR VASCULAR ACCESS;  Surgeon: Nada Libman, MD;  Location: [facility] OR;  Service: Vascular;  Laterality: Right;   Family History  Problem Relation Age of Onset   Hypertension Other    Heart disease Other    Breast cancer Sister    Social History   Socioeconomic History   Marital status: Widowed    Spouse name: Not on file   Number of children: Not on file   Years of education: 12   Highest education level: Not on file  Occupational History   Occupation: Retired  Tobacco Use   Smoking status: Never   Smokeless tobacco: Never  Vaping Use   Vaping status: Never Used  Substance and Sexual Activity   Alcohol use: No   Drug use: No   Sexual activity: Not Currently    Birth control/protection: Post-menopausal  Other Topics Concern   Not on file  Social History Narrative   Lives alone but surrounded by family   Social Drivers of Corporate investment banker Strain: Low Risk  (10/09/2023)   Overall Financial Resource Strain (CARDIA)    Difficulty of Paying Living Expenses: Not hard at all  Food Insecurity: No Food Insecurity (10/09/2023)   Hunger Vital Sign    Worried About Running Out of Food in the Last Year: Never true    Ran Out of Food in the Last Year: Never true  Transportation Needs: No  Transportation Needs (10/09/2023)   PRAPARE - Administrator, Civil Service (Medical): No    Lack of Transportation (Non-Medical): No  Physical Activity: Inactive (10/09/2023)   Exercise Vital Sign    Days of Exercise per Week: 0 days    Minutes of Exercise per Session: 0 min  Stress: No Stress Concern Present (10/09/2023)   [employer] of Occupational Health - Occupational Stress Questionnaire    Feeling of Stress : Not at all  Social Connections: Socially Isolated (10/09/2023)   Social Connection and Isolation Panel [NHANES]    Frequency of Communication with Friends and Family: More than three times a week    Frequency of Social Gatherings with Friends and Family: More than three times a  week    Attends Religious Services: Never    Active Member of Clubs or Organizations: No    Attends Banker Meetings: Never    Marital Status: Widowed    Tobacco Counseling Counseling given: Not Answered   Clinical Intake:  Pre-visit preparation completed: Yes  Pain : No/denies pain     BMI - recorded: 23.58 Nutritional Status: BMI of 19-24  Normal Nutritional Risks: None  How often do you need to have someone help you when you read instructions, pamphlets, or other written materials from your doctor or pharmacy?: 1 - Never  Interpreter Needed?: No  Information entered by :: Brandii Lakey, RMA   Activities of Daily Living    10/09/2023    1:55 PM 11/24/2022    4:00 PM  In your present state of health, do you have any difficulty performing the following activities:  Hearing? 0 0  Vision? 0 0  Difficulty concentrating or making decisions? 0 0  Walking or climbing stairs? 0 1  Dressing or bathing? 0 0  Doing errands, shopping? 0 1  Comment Family drives her   Preparing Food and eating ? N   Using the Toilet? N   In the past six months, have you accidently leaked urine? N   Do you have problems with loss of bowel control? N   Managing your  Medications? N   Managing your Finances? N   Housekeeping or managing your Housekeeping? N     Patient Care Team: Plotnikov, Georgina Quint, MD as PCP - General Jodelle Red, MD as PCP - Cardiology (Cardiology) Mateo Flow, MD as Consulting Physician (Ophthalmology)  Indicate any recent Medical Services you may have received from other than [facility] providers in the past year (date may be approximate).     Assessment:   This is a routine wellness examination for Jasmine Mccormick.  Hearing/Vision screen Hearing Screening - Comments:: Denies hearing difficulties   Vision Screening - Comments:: Wears eyeglasses   Goals Addressed   None   Depression Screen    10/09/2023    2:07 PM 12/15/2022   11:23 AM 06/15/2022   11:40 AM 04/11/2022   11:21 AM 03/10/2021    3:06 PM 09/17/2020   10:48 AM 02/20/2020    1:09 PM  PHQ 2/9 Scores  PHQ - 2 Score 0 0 0 0 0 0 0  PHQ- 9 Score 0   3  0     Fall Risk    10/09/2023    1:58 PM 12/15/2022   11:41 AM 12/15/2022   11:23 AM 06/15/2022   11:36 AM 04/11/2022   11:20 AM  Fall Risk   Falls in the past year? 1 1 1  0 0  Number falls in past yr: 0 0 0 0 1  Injury with Fall? 0  1 0 1  Risk for fall due to :  History of fall(s);Impaired balance/gait History of fall(s);Impaired balance/gait No Fall Risks History of fall(s);Impaired balance/gait  Follow up Falls evaluation completed;Falls prevention discussed Falls evaluation completed Falls evaluation completed Falls prevention discussed Falls evaluation completed    MEDICARE RISK AT HOME: Medicare Risk at Home Any stairs in or around the home?: No Home free of loose throw rugs in walkways, pet beds, electrical cords, etc?: Yes Adequate lighting in your home to reduce risk of falls?: Yes Life alert?: Yes (does not where it around her neck but keep it near her) Use of a cane, walker or w/c?: Yes (cane) Grab bars in the  bathroom?: No (has someone there with her durning her bath) Shower chair or bench in  shower?: No Elevated toilet seat or a handicapped toilet?: No  TIMED UP AND GO:  Was the test performed?  No    Cognitive Function:    11/30/2017   11:43 AM  MMSE - Mini Mental State Exam  Orientation to time 5  Orientation to Place 5  Registration 3  Attention/ Calculation 5  Recall 2  Language- name 2 objects 2  Language- repeat 1  Language- follow 3 step command 3  Language- read & follow direction 1  Write a sentence 1  Copy design 1  Total score 29        10/09/2023    1:58 PM 06/15/2022   11:52 AM  6CIT Screen  What Year? 0 points 0 points  What month? 0 points 0 points  What time? 0 points 0 points  Count back from 20 0 points 0 points  Months in reverse 2 points 0 points  Repeat phrase 6 points 0 points  Total Score 8 points 0 points    Immunizations Immunization History  Administered Date(s) Administered   Fluad Quad(high Dose 65+) 05/12/2021, 04/26/2022   Influenza Split 05/30/2011, 05/15/2012   Influenza Whole 05/15/2006, 05/04/2010   Influenza, High Dose Seasonal PF 05/16/2015, 05/31/2016, 05/20/2017, 05/08/2018, 05/09/2019, 05/12/2021, 04/26/2022   Influenza-Unspecified 05/15/2013, 05/15/2014, 04/26/2023   PFIZER(Purple Top)SARS-COV-2 Vaccination 09/03/2019, 09/23/2019, 06/10/2020   Pneumococcal Conjugate-13 08/01/2013   Pneumococcal Polysaccharide-23 07/04/2012, 09/17/2020   Td 07/04/2012   Zoster Recombinant(Shingrix) 09/29/2016    TDAP status: Due, Education has been provided regarding the importance of this vaccine. Advised may receive this vaccine at local pharmacy or Health Dept. Aware to provide a copy of the vaccination record if obtained from local pharmacy or Health Dept. Verbalized acceptance and understanding.  Flu Vaccine status: Up to date  Pneumococcal vaccine status: Up to date  Covid-19 vaccine status: Information provided on how to obtain vaccines.   Qualifies for Shingles Vaccine? Yes   Zostavax completed Yes   Shingrix  Completed?: No.    Education has been provided regarding the importance of this vaccine. Patient has been advised to call insurance company to determine out of pocket expense if they have not yet received this vaccine. Advised may also receive vaccine at local pharmacy or Health Dept. Verbalized acceptance and understanding.  Screening Tests Health Maintenance  Topic Date Due   Zoster Vaccines- Shingrix (2 of 2) 11/24/2016   DTaP/Tdap/Td (2 - Tdap) 07/04/2022   COVID-19 Vaccine (4 - 2024-25 season) 04/16/2023   Medicare Annual Wellness (AWV)  10/08/2024   Pneumonia Vaccine 38+ Years old  Completed   INFLUENZA VACCINE  Completed   DEXA SCAN  Completed   HPV VACCINES  Aged Out    Health Maintenance  Health Maintenance Due  Topic Date Due   Zoster Vaccines- Shingrix (2 of 2) 11/24/2016   DTaP/Tdap/Td (2 - Tdap) 07/04/2022   COVID-19 Vaccine (4 - 2024-25 season) 04/16/2023    Colorectal cancer screening: No longer required.   Mammogram status: No longer required due to age.  Bone Density status: Completed 10/18/2017. Results reflect: Bone density results: OSTEOPOROSIS. Repeat every 2 years.  Lung Cancer Screening: (Low Dose CT Chest recommended if Age 31-80 years, 20 pack-year currently smoking OR have quit w/in 15years.) does not qualify.   Lung Cancer Screening Referral: N/A  Additional Screening:  Hepatitis C Screening: does not qualify;  Vision Screening: Recommended annual ophthalmology exams for  early detection of glaucoma and other disorders of the eye. Is the patient up to date with their annual eye exam?  Yes  Who is the provider or what is the name of the office in which the patient attends annual eye exams? Dr. Elmer Picker If pt is not established with a provider, would they like to be referred to a provider to establish care? No .   Dental Screening: Recommended annual dental exams for proper oral hygiene   Community Resource Referral / Chronic Care Management: CRR  required this visit?  No   CCM required this visit?  No     Plan:     I have personally reviewed and noted the following in the patient's chart:   Medical and social history Use of alcohol, tobacco or illicit drugs  Current medications and supplements including opioid prescriptions. Patient is not currently taking opioid prescriptions. Functional ability and status Nutritional status Physical activity Advanced directives List of other physicians Hospitalizations, surgeries, and ER visits in previous 12 months Vitals Screenings to include cognitive, depression, and falls Referrals and appointments  In addition, I have reviewed and discussed with patient certain preventive protocols, quality metrics, and best practice recommendations. A written personalized care plan for preventive services as well as general preventive health recommendations were provided to patient.     Shaymus Eveleth L Alexica Schlossberg, CMA   10/09/2023   After Visit Summary: (MyChart) Due to this being a telephonic visit, the after visit summary with patients personalized plan was offered to patient via MyChart   Nurse Notes: Patient is due for Tdap vaccine and a Shingrix vaccine.  Patient had no concerns to address today.

## 2023-10-26 ENCOUNTER — Encounter: Payer: Self-pay | Admitting: Internal Medicine

## 2023-10-26 ENCOUNTER — Ambulatory Visit: Payer: Medicare Other | Admitting: Internal Medicine

## 2023-10-26 VITALS — BP 122/66 | HR 57 | Temp 97.8°F | Ht 62.5 in | Wt 126.0 lb

## 2023-10-26 DIAGNOSIS — R7989 Other specified abnormal findings of blood chemistry: Secondary | ICD-10-CM

## 2023-10-26 DIAGNOSIS — S32030A Wedge compression fracture of third lumbar vertebra, initial encounter for closed fracture: Secondary | ICD-10-CM

## 2023-10-26 DIAGNOSIS — E559 Vitamin D deficiency, unspecified: Secondary | ICD-10-CM

## 2023-10-26 DIAGNOSIS — E034 Atrophy of thyroid (acquired): Secondary | ICD-10-CM

## 2023-10-26 DIAGNOSIS — R269 Unspecified abnormalities of gait and mobility: Secondary | ICD-10-CM | POA: Diagnosis not present

## 2023-10-26 DIAGNOSIS — R748 Abnormal levels of other serum enzymes: Secondary | ICD-10-CM | POA: Diagnosis not present

## 2023-10-26 DIAGNOSIS — E785 Hyperlipidemia, unspecified: Secondary | ICD-10-CM

## 2023-10-26 DIAGNOSIS — R413 Other amnesia: Secondary | ICD-10-CM | POA: Diagnosis not present

## 2023-10-26 DIAGNOSIS — R8290 Unspecified abnormal findings in urine: Secondary | ICD-10-CM | POA: Diagnosis not present

## 2023-10-26 LAB — URINALYSIS, ROUTINE W REFLEX MICROSCOPIC
Bilirubin Urine: NEGATIVE
Ketones, ur: NEGATIVE
Nitrite: NEGATIVE
Specific Gravity, Urine: 1.015 (ref 1.000–1.030)
Total Protein, Urine: 30 — AB
Urine Glucose: NEGATIVE
Urobilinogen, UA: 0.2 (ref 0.0–1.0)
pH: 6.5 (ref 5.0–8.0)

## 2023-10-26 LAB — CBC WITH DIFFERENTIAL/PLATELET
Basophils Absolute: 0 10*3/uL (ref 0.0–0.1)
Basophils Relative: 0.4 % (ref 0.0–3.0)
Eosinophils Absolute: 0 10*3/uL (ref 0.0–0.7)
Eosinophils Relative: 0.7 % (ref 0.0–5.0)
HCT: 39.1 % (ref 36.0–46.0)
Hemoglobin: 13.1 g/dL (ref 12.0–15.0)
Lymphocytes Relative: 16.6 % (ref 12.0–46.0)
Lymphs Abs: 0.7 10*3/uL (ref 0.7–4.0)
MCHC: 33.4 g/dL (ref 30.0–36.0)
MCV: 92.9 fl (ref 78.0–100.0)
Monocytes Absolute: 0.7 10*3/uL (ref 0.1–1.0)
Monocytes Relative: 16.8 % — ABNORMAL HIGH (ref 3.0–12.0)
Neutro Abs: 2.9 10*3/uL (ref 1.4–7.7)
Neutrophils Relative %: 65.5 % (ref 43.0–77.0)
Platelets: 185 10*3/uL (ref 150.0–400.0)
RBC: 4.2 Mil/uL (ref 3.87–5.11)
RDW: 13.3 % (ref 11.5–15.5)
WBC: 4.4 10*3/uL (ref 4.0–10.5)

## 2023-10-26 LAB — COMPREHENSIVE METABOLIC PANEL
ALT: 332 U/L — ABNORMAL HIGH (ref 0–35)
AST: 141 U/L — ABNORMAL HIGH (ref 0–37)
Albumin: 3.6 g/dL (ref 3.5–5.2)
Alkaline Phosphatase: 87 U/L (ref 39–117)
BUN: 20 mg/dL (ref 6–23)
CO2: 27 meq/L (ref 19–32)
Calcium: 9.6 mg/dL (ref 8.4–10.5)
Chloride: 101 meq/L (ref 96–112)
Creatinine, Ser: 1.12 mg/dL (ref 0.40–1.20)
GFR: 44.24 mL/min — ABNORMAL LOW (ref >60.00)
Glucose, Bld: 107 mg/dL — ABNORMAL HIGH (ref 70–99)
Potassium: 4.1 meq/L (ref 3.5–5.1)
Sodium: 135 meq/L (ref 135–145)
Total Bilirubin: 0.4 mg/dL (ref 0.2–1.2)
Total Protein: 6.3 g/dL (ref 6.0–8.3)

## 2023-10-26 LAB — LIPID PANEL
Cholesterol: 166 mg/dL (ref 0–200)
HDL: 58.7 mg/dL (ref >39.00)
LDL Cholesterol: 80 mg/dL (ref 0–99)
NonHDL: 107.12
Total CHOL/HDL Ratio: 3
Triglycerides: 134 mg/dL (ref 0.0–149.0)
VLDL: 26.8 mg/dL (ref 0.0–40.0)

## 2023-10-26 LAB — VITAMIN B12: Vitamin B-12: >1537 pg/mL — ABNORMAL HIGH (ref 211–911)

## 2023-10-26 LAB — VITAMIN D 25 HYDROXY (VIT D DEFICIENCY, FRACTURES): VITD: 38.42 ng/mL (ref 30.00–100.00)

## 2023-10-26 LAB — TSH: TSH: 4.62 u[IU]/mL (ref 0.35–5.50)

## 2023-10-26 NOTE — Assessment & Plan Note (Signed)
Vit D and calcium 

## 2023-10-26 NOTE — Progress Notes (Signed)
 Subjective:  Patient ID: Jasmine Mccormick, female    DOB: 14-Mar-1934  Age: 89 y.o. MRN: [id number]  CC: Annual Exam   HPI ERNISHA SORN presents for A fib, OA, dyslipidemia Pt has help at home She is here w/her niece Pam   Outpatient Medications Prior to Visit  Medication Sig Dispense Refill   acetaminophen (TYLENOL) 325 MG tablet Take 2 tablets (650 mg total) by mouth every 6 (six) hours as needed for mild pain (or Fever >/= 101).     amiodarone (PACERONE) 200 MG tablet TAKE 1 TABLET BY MOUTH EVERY DAY 90 tablet 3   Calcium-Phosphorus-Vitamin D (CITRACAL +D3 PO)      Cholecalciferol (VITAMIN D3) 50 MCG (2000 UT) capsule Take 1 capsule (2,000 Units total) by mouth daily. 100 capsule 3   Cyanocobalamin (VITAMIN B-12) 1000 MCG SUBL Place 1 tablet (1,000 mcg total) under the tongue daily. 100 tablet 3   ELIQUIS 5 MG TABS tablet TAKE 1 TABLET BY MOUTH 2 TIMES DAILY. 60 tablet 5   ezetimibe (ZETIA) 10 MG tablet Take 1 tablet (10 mg total) by mouth daily. 90 tablet 2   levothyroxine (SYNTHROID) 88 MCG tablet Take 1 tablet (88 mcg total) by mouth daily. 90 tablet 3   Multiple Vitamins-Minerals (PRESERVISION AREDS 2 PO) Take by mouth 2 (two) times daily.     simvastatin (ZOCOR) 20 MG tablet Take 1 tablet (20 mg total) by mouth daily. 90 tablet 2   No facility-administered medications prior to visit.    ROS: Review of Systems  Constitutional:  Negative for activity change, appetite change, chills, fatigue and unexpected weight change.  HENT:  Negative for congestion, mouth sores and sinus pressure.   Eyes:  Negative for visual disturbance.  Respiratory:  Negative for cough and chest tightness.   Gastrointestinal:  Negative for abdominal pain and nausea.  Genitourinary:  Negative for difficulty urinating, frequency and vaginal pain.  Musculoskeletal:  Negative for back pain and gait problem.  Skin:  Negative for pallor and rash.  Neurological:  Negative for dizziness, tremors,  weakness, numbness and headaches.  Psychiatric/Behavioral:  Positive for decreased concentration. Negative for confusion, sleep disturbance and suicidal ideas. The patient is not nervous/anxious.     Objective:  BP 122/66   Pulse (!) 57   Temp 97.8 F (36.6 C) (Oral)   Ht 5' 2.5" (1.588 m)   Wt 126 lb (57.2 kg)   LMP  (LMP Unknown)   SpO2 98%   BMI 22.68 kg/m   BP Readings from Last 3 Encounters:  10/26/23 122/66  10/09/23 (!) 140/78  06/23/23 122/64    Wt Readings from Last 3 Encounters:  10/26/23 126 lb (57.2 kg)  10/09/23 131 lb (59.4 kg)  06/23/23 135 lb (61.2 kg)    Physical Exam Constitutional:      General: She is not in acute distress.    Appearance: She is well-developed.  HENT:     Head: Normocephalic.     Right Ear: External ear normal.     Left Ear: External ear normal.     Nose: Nose normal.  Eyes:     General:        Right eye: No discharge.        Left eye: No discharge.     Conjunctiva/sclera: Conjunctivae normal.     Pupils: Pupils are equal, round, and reactive to light.  Neck:     Thyroid: No thyromegaly.     Vascular: No JVD.  Trachea: No tracheal deviation.  Cardiovascular:     Rate and Rhythm: Normal rate and regular rhythm.     Heart sounds: Normal heart sounds.  Pulmonary:     Effort: No respiratory distress.     Breath sounds: No stridor. No wheezing.  Abdominal:     General: Bowel sounds are normal. There is no distension.     Palpations: Abdomen is soft. There is no mass.     Tenderness: There is no abdominal tenderness. There is no guarding or rebound.  Musculoskeletal:        General: No tenderness.     Cervical back: Normal range of motion and neck supple. No rigidity.  Lymphadenopathy:     Cervical: No cervical adenopathy.  Skin:    Findings: No erythema or rash.  Neurological:     Cranial Nerves: No cranial nerve deficit.     Motor: No abnormal muscle tone.     Coordination: Coordination normal.     Deep Tendon  Reflexes: Reflexes normal.  Psychiatric:        Behavior: Behavior normal.        Thought Content: Thought content normal.        Judgment: Judgment normal.     Lab Results  Component Value Date   WBC 4.4 10/26/2023   HGB 13.1 10/26/2023   HCT 39.1 10/26/2023   PLT 185.0 10/26/2023   GLUCOSE 107 (H) 10/26/2023   CHOL 166 10/26/2023   TRIG 134.0 10/26/2023   HDL 58.70 10/26/2023   LDLCALC 80 10/26/2023   ALT 332 (H) 10/26/2023   AST 141 (H) 10/26/2023   NA 135 10/26/2023   K 4.1 10/26/2023   CL 101 10/26/2023   CREATININE 1.12 10/26/2023   BUN 20 10/26/2023   CO2 27 10/26/2023   TSH 4.62 10/26/2023   INR 2.8 11/01/2021   HGBA1C 6.2 09/17/2020    ECHOCARDIOGRAM COMPLETE Result Date: 11/25/2022    ECHOCARDIOGRAM REPORT   Patient Name:   Jasmine Mccormick Date of Exam: 11/25/2022 Medical Rec #:  [id number]          Height:       62.5 in Accession #:    [id number]         Weight:       142.1 lb Date of Birth:  [birth year]         BSA:          1.663 m Patient Age:    88 years           BP:           160/77 mmHg Patient Gender: F                  HR:           57 bpm. Exam Location:  Inpatient Procedure: 2D Echo, Cardiac Doppler and Color Doppler Indications:    Syncope R55  History:        Patient has prior history of Echocardiogram examinations, most                 recent 03/30/2021. Arrythmias:Atrial Fibrillation,                 Signs/Symptoms:Syncope; Risk Factors:Hypertension and                 Dyslipidemia.  Sonographer:    Lucendia Herrlich Referring Phys: [id number] Lyn Hollingshead B MELVIN IMPRESSIONS  1. Left ventricular ejection fraction, by estimation, is 60 to 65%.  The left ventricle has normal function. The left ventricle has no regional wall motion abnormalities. There is mild left ventricular hypertrophy. Left ventricular diastolic parameters are consistent with Grade I diastolic dysfunction (impaired relaxation).  2. Right ventricular systolic function is normal. The right  ventricular size is normal. There is normal pulmonary artery systolic pressure. The estimated right ventricular systolic pressure is 28.4 mmHg.  3. Left atrial size was moderately dilated.  4. Right atrial size was mildly dilated.  5. The mitral valve is degenerative. Mild mitral valve regurgitation.  6. Aortic valve regurgitation is not visualized. Aortic valve sclerosis/calcification is present, without any evidence of aortic stenosis.  7. The inferior vena cava is normal in size with greater than 50% respiratory variability, suggesting right atrial pressure of 3 mmHg. FINDINGS  Left Ventricle: Left ventricular ejection fraction, by estimation, is 60 to 65%. The left ventricle has normal function. The left ventricle has no regional wall motion abnormalities. The left ventricular internal cavity size was normal in size. There is  mild left ventricular hypertrophy. Left ventricular diastolic parameters are consistent with Grade I diastolic dysfunction (impaired relaxation). Right Ventricle: The right ventricular size is normal. Right ventricular systolic function is normal. There is normal pulmonary artery systolic pressure. The tricuspid regurgitant velocity is 2.52 m/s, and with an assumed right atrial pressure of 3 mmHg,  the estimated right ventricular systolic pressure is 28.4 mmHg. Left Atrium: Left atrial size was moderately dilated. Right Atrium: Right atrial size was mildly dilated. Pericardium: There is no evidence of pericardial effusion. Mitral Valve: The mitral valve is degenerative in appearance. There is moderate calcification of the mitral valve leaflet(s). Mild mitral valve regurgitation. Tricuspid Valve: Tricuspid valve regurgitation is mild. Aortic Valve: Aortic valve regurgitation is not visualized. Aortic valve sclerosis/calcification is present, without any evidence of aortic stenosis. Aortic valve mean gradient measures 9.0 mmHg. Aortic valve peak gradient measures 16.3 mmHg. Aortic valve  area, by VTI measures 0.93 cm. Pulmonic Valve: Pulmonic valve regurgitation is not visualized. Aorta: The aortic root and ascending aorta are structurally normal, with no evidence of dilitation. Venous: The inferior vena cava is normal in size with greater than 50% respiratory variability, suggesting right atrial pressure of 3 mmHg. IAS/Shunts: No atrial level shunt detected by color flow Doppler.  LEFT VENTRICLE PLAX 2D LVIDd:         3.50 cm   Diastology LVIDs:         2.40 cm   LV e' medial:    4.58 cm/s LV PW:         1.30 cm   LV E/e' medial:  14.0 LV IVS:        0.90 cm   LV e' lateral:   4.28 cm/s LVOT diam:     1.80 cm   LV E/e' lateral: 15.0 LV SV:         43 LV SV Index:   26 LVOT Area:     2.54 cm  RIGHT VENTRICLE             IVC RV S prime:     14.40 cm/s  IVC diam: 1.90 cm TAPSE (M-mode): 1.5 cm LEFT ATRIUM             Index        RIGHT ATRIUM           Index LA diam:        4.40 cm 2.65 cm/m   RA Area:     15.90  cm LA Vol (A2C):   53.2 ml 31.96 ml/m  RA Volume:   39.50 ml  23.75 ml/m LA Vol (A4C):   44.5 ml 26.76 ml/m LA Biplane Vol: 53.0 ml 31.87 ml/m  AORTIC VALVE AV Area (Vmax):    1.06 cm AV Area (Vmean):   0.93 cm AV Area (VTI):     0.93 cm AV Vmax:           202.00 cm/s AV Vmean:          144.000 cm/s AV VTI:            0.465 m AV Peak Grad:      16.3 mmHg AV Mean Grad:      9.0 mmHg LVOT Vmax:         84.20 cm/s LVOT Vmean:        52.767 cm/s LVOT VTI:          0.171 m LVOT/AV VTI ratio: 0.37  AORTA Ao Root diam: 2.90 cm Ao Asc diam:  2.90 cm MITRAL VALVE               TRICUSPID VALVE MV Area (PHT): 2.62 cm    TR Peak grad:   25.4 mmHg MV Decel Time: 290 msec    TR Vmax:        252.00 cm/s MV E velocity: 64.00 cm/s MV A velocity: 73.80 cm/s  SHUNTS MV E/A ratio:  0.87        Systemic VTI:  0.17 m                            Systemic Diam: 1.80 cm Carolan Clines Electronically signed by Carolan Clines Signature Date/Time: 11/25/2022/3:33:35 PM    Final    DG Abd 2 Views Result Date:  11/25/2022 CLINICAL DATA:  Pain EXAM: ABDOMEN - 2 VIEW COMPARISON:  None Available. FINDINGS: The bowel gas pattern is normal. There is no evidence of free air. No radio-opaque calculi or other significant radiographic abnormality is seen. IMPRESSION: Negative. Electronically Signed   By: Layla Maw M.D.   On: 11/25/2022 09:53   DG Chest 2 View Result Date: 11/25/2022 CLINICAL DATA:  2672 Acidosis 2672 EXAM: CHEST - 2 VIEW COMPARISON:  03/29/2021. FINDINGS: The heart size and mediastinal contours are within normal limits. Both lungs are clear. No pneumothorax or pleural effusion. There are thoracic degenerative changes. IMPRESSION: No active cardiopulmonary disease. Electronically Signed   By: Layla Maw M.D.   On: 11/25/2022 09:52   US RENAL Result Date: 11/25/2022 CLINICAL DATA:  045409 ARF (acute renal failure) [id number] EXAM: RENAL / URINARY TRACT ULTRASOUND COMPLETE COMPARISON:  None Available. FINDINGS: The right kidney measured 10.6 cm and the left kidney measured 9.7 cm. The kidneys demonstrate normal echogenicity. Left kidney cysts identified measuring 1.8 cm this is not need imaging follow up. No shadowing stones are seen. No hydronephrosis. The urinary bladder appeared unremarkable. Bladder volumes were not measured and no postvoid images are submitted. IMPRESSION: Left kidney cyst. Otherwise unremarkable study which was technologically limited. Electronically Signed   By: Layla Maw M.D.   On: 11/25/2022 09:47   CT Head Wo Contrast Result Date: 11/24/2022 CLINICAL DATA:  Neck trauma (Age >= 65y); Head trauma, minor (Age >= 65y) EXAM: CT HEAD WITHOUT CONTRAST CT CERVICAL SPINE WITHOUT CONTRAST TECHNIQUE: Multidetector CT imaging of the head and cervical spine was performed following the standard protocol without intravenous contrast. Multiplanar CT image reconstructions  of the cervical spine were also generated. RADIATION DOSE REDUCTION: This exam was performed according to the  departmental dose-optimization program which includes automated exposure control, adjustment of the mA and/or kV according to patient size and/or use of iterative reconstruction technique. COMPARISON:  None Available. FINDINGS: CT HEAD FINDINGS Brain: No evidence of acute infarction, hemorrhage, hydrocephalus, extra-axial collection or mass lesion/mass effect. Vascular: No hyperdense vessel.  Calcific atherosclerosis. Skull: No acute fracture. Sinuses/Orbits: No acute finding. Other: Postsurgical changes of right mastoidectomy. No mastoid effusions. CT CERVICAL SPINE FINDINGS Alignment: No substantial sagittal subluxation. Skull base and vertebrae: Vertebral body heights are maintained. No evidence of acute fracture. Osteopenia. Soft tissues and spinal canal: No prevertebral fluid or swelling. No visible canal hematoma. Disc levels:  Multilevel facet and uncovertebral hypertrophy. Upper chest: Visualized lung apices are clear. IMPRESSION: 1. No evidence of acute intracranial abnormality. 2. No evidence of acute fracture or traumatic malalignment. Electronically Signed   By: Feliberto Harts M.D.   On: 11/24/2022 12:05   CT Cervical Spine Wo Contrast Result Date: 11/24/2022 CLINICAL DATA:  Neck trauma (Age >= 65y); Head trauma, minor (Age >= 65y) EXAM: CT HEAD WITHOUT CONTRAST CT CERVICAL SPINE WITHOUT CONTRAST TECHNIQUE: Multidetector CT imaging of the head and cervical spine was performed following the standard protocol without intravenous contrast. Multiplanar CT image reconstructions of the cervical spine were also generated. RADIATION DOSE REDUCTION: This exam was performed according to the departmental dose-optimization program which includes automated exposure control, adjustment of the mA and/or kV according to patient size and/or use of iterative reconstruction technique. COMPARISON:  None Available. FINDINGS: CT HEAD FINDINGS Brain: No evidence of acute infarction, hemorrhage, hydrocephalus, extra-axial  collection or mass lesion/mass effect. Vascular: No hyperdense vessel.  Calcific atherosclerosis. Skull: No acute fracture. Sinuses/Orbits: No acute finding. Other: Postsurgical changes of right mastoidectomy. No mastoid effusions. CT CERVICAL SPINE FINDINGS Alignment: No substantial sagittal subluxation. Skull base and vertebrae: Vertebral body heights are maintained. No evidence of acute fracture. Osteopenia. Soft tissues and spinal canal: No prevertebral fluid or swelling. No visible canal hematoma. Disc levels:  Multilevel facet and uncovertebral hypertrophy. Upper chest: Visualized lung apices are clear. IMPRESSION: 1. No evidence of acute intracranial abnormality. 2. No evidence of acute fracture or traumatic malalignment. Electronically Signed   By: Feliberto Harts M.D.   On: 11/24/2022 12:05    Assessment & Plan:   Problem List Items Addressed This Visit     Abnormal urinalysis   Obtain abdominal ultrasound.  Obtain UA and culture      Relevant Orders   US Abdomen Complete   Urinalysis   CULTURE, URINE COMPREHENSIVE   Dyslipidemia - Primary   On Simvastatin and Zetia      Relevant Orders   TSH (Completed)   Urinalysis   CBC with Differential/Platelet (Completed)   Lipid panel (Completed)   Comprehensive metabolic panel (Completed)   Vitamin B12 (Completed)   VITAMIN D 25 Hydroxy (Vit-D Deficiency, Fractures) (Completed)   Elevated liver enzymes   New.  Hold simvastatin and ezetimibe Obtain abdominal ultrasound Repeat labs in 4 weeks      Relevant Orders   US Abdomen Complete   Comprehensive metabolic panel   Urinalysis   CULTURE, URINE COMPREHENSIVE   Gait disorder   Cane or Rollator Walker with Seat PT prn      Relevant Orders   TSH (Completed)   Urinalysis   CBC with Differential/Platelet (Completed)   Lipid panel (Completed)   Comprehensive metabolic panel (Completed)  Vitamin B12 (Completed)   VITAMIN D 25 Hydroxy (Vit-D Deficiency, Fractures)  (Completed)   Hypothyroidism   Chronic On Levothroid Check TSH      Relevant Orders   TSH (Completed)   Urinalysis   CBC with Differential/Platelet (Completed)   Lipid panel (Completed)   Comprehensive metabolic panel (Completed)   Vitamin B12 (Completed)   VITAMIN D 25 Hydroxy (Vit-D Deficiency, Fractures) (Completed)   Low vitamin B12 level    Cont on B12 vitamin      Relevant Orders   TSH (Completed)   Urinalysis   CBC with Differential/Platelet (Completed)   Lipid panel (Completed)   Comprehensive metabolic panel (Completed)   Vitamin B12 (Completed)   VITAMIN D 25 Hydroxy (Vit-D Deficiency, Fractures) (Completed)   Memory problem   Pt has help at home She is here w/her nice Pam      Vitamin D deficiency   Relevant Orders   VITAMIN D 25 Hydroxy (Vit-D Deficiency, Fractures) (Completed)   Wedge compression fracture of third lumbar vertebra, initial encounter for closed fracture (HCC)   Vit D and calcium         No orders of the defined types were placed in this encounter.     Follow-up: Return in about 6 months (around 04/27/2024) for a follow-up visit.  Sonda Primes, MD

## 2023-10-26 NOTE — Assessment & Plan Note (Signed)
 Cane or Teaching laboratory technician with Seat PT prn

## 2023-10-26 NOTE — Assessment & Plan Note (Signed)
Cont on B12 vitamin

## 2023-10-26 NOTE — Assessment & Plan Note (Signed)
 Pt has help at home She is here w/her nice Elita Quick

## 2023-10-26 NOTE — Assessment & Plan Note (Signed)
Chronic On Levothroid Check TSH

## 2023-10-26 NOTE — Assessment & Plan Note (Signed)
 On Simvastatin and Zetia

## 2023-10-27 ENCOUNTER — Encounter: Payer: Self-pay | Admitting: Internal Medicine

## 2023-10-27 DIAGNOSIS — R8290 Unspecified abnormal findings in urine: Secondary | ICD-10-CM | POA: Insufficient documentation

## 2023-10-27 DIAGNOSIS — R748 Abnormal levels of other serum enzymes: Secondary | ICD-10-CM | POA: Insufficient documentation

## 2023-10-27 NOTE — Addendum Note (Signed)
 Addended by: Tresa Garter on: 10/27/2023 07:59 AM   Modules accepted: Level of Service

## 2023-10-27 NOTE — Assessment & Plan Note (Signed)
 New.  Hold simvastatin and ezetimibe Obtain abdominal ultrasound Repeat labs in 4 weeks

## 2023-10-27 NOTE — Assessment & Plan Note (Signed)
 Obtain abdominal ultrasound.  Obtain UA and culture

## 2023-10-27 NOTE — Addendum Note (Signed)
 Addended by: Tresa Garter on: 10/27/2023 07:56 AM   Modules accepted: Orders, Level of Service

## 2023-11-01 ENCOUNTER — Other Ambulatory Visit: Payer: Self-pay | Admitting: Internal Medicine

## 2023-11-01 MED ORDER — NITROFURANTOIN MONOHYD MACRO 100 MG PO CAPS: 100.0000 mg | ORAL_CAPSULE | Freq: Two times a day (BID) | ORAL | 0 refills | Status: AC

## 2023-11-02 ENCOUNTER — Other Ambulatory Visit: Payer: Self-pay | Admitting: Internal Medicine

## 2023-11-03 ENCOUNTER — Ambulatory Visit
Admission: RE | Admit: 2023-11-03 | Discharge: 2023-11-03 | Disposition: A | Discharge status: Home / Self Care | Source: Ambulatory Visit | Attending: Internal Medicine | Admitting: Internal Medicine

## 2023-11-03 DIAGNOSIS — N200 Calculus of kidney: Secondary | ICD-10-CM | POA: Diagnosis not present

## 2023-11-03 DIAGNOSIS — N289 Disorder of kidney and ureter, unspecified: Secondary | ICD-10-CM | POA: Diagnosis not present

## 2023-11-03 DIAGNOSIS — R748 Abnormal levels of other serum enzymes: Secondary | ICD-10-CM

## 2023-11-03 DIAGNOSIS — R945 Abnormal results of liver function studies: Secondary | ICD-10-CM | POA: Diagnosis not present

## 2023-11-03 DIAGNOSIS — R8290 Unspecified abnormal findings in urine: Secondary | ICD-10-CM

## 2023-11-03 DIAGNOSIS — K769 Liver disease, unspecified: Secondary | ICD-10-CM | POA: Diagnosis not present

## 2023-11-05 ENCOUNTER — Encounter: Payer: Self-pay | Admitting: Internal Medicine

## 2023-11-22 ENCOUNTER — Encounter: Payer: Self-pay | Admitting: Podiatry

## 2023-11-22 ENCOUNTER — Ambulatory Visit (INDEPENDENT_AMBULATORY_CARE_PROVIDER_SITE_OTHER): Payer: Medicare Other | Admitting: Podiatry

## 2023-11-22 VITALS — Ht 62.5 in | Wt 126.0 lb

## 2023-11-22 DIAGNOSIS — M79674 Pain in right toe(s): Secondary | ICD-10-CM

## 2023-11-22 DIAGNOSIS — B351 Tinea unguium: Secondary | ICD-10-CM

## 2023-11-22 DIAGNOSIS — M79675 Pain in left toe(s): Secondary | ICD-10-CM

## 2023-11-26 NOTE — Progress Notes (Signed)
  Subjective:  Patient ID: Jasmine Mccormick, female    DOB: 1933/10/31,  MRN: [id number]  LEODA SMITHHART presents to clinic today for at risk foot care with h/o clotting disorder and painful, elongated thickened toenails x 10 which are symptomatic when wearing enclosed shoe gear. This interferes with his/her daily activities. Her niece is present during today's visit. Chief Complaint  Patient presents with   Nail Problem    Pt is here for [facility] PCP is Dr Georgia Kipper and LOV was in march.   New problem(s): None.   PCP is Plotnikov, Oakley Bellman, MD.  No Known Allergies  Review of Systems: Negative except as noted in the HPI.  Objective: No changes noted in today's physical examination. There were no vitals filed for this visit. Jasmine Mccormick is a pleasant 89 y.o. female WD, WN in NAD. AAO x 3.  Vascular Examination: Capillary refill time immediate b/l. Vascular status intact b/l with palpable pedal pulses. Pedal hair present b/l. No pain with calf compression b/l. Skin temperature gradient WNL b/l. No cyanosis or clubbing b/l. No ischemia or gangrene noted b/l. Varicosities present b/l.  Neurological Examination: Sensation grossly intact b/l with 10 gram monofilament. Vibratory sensation intact b/l.   Dermatological Examination: Pedal skin with normal turgor, texture and tone b/l.  No open wounds. No interdigital macerations.   Toenails 1-5 b/l thick, discolored, elongated with subungual debris and pain on dorsal palpation.   No hyperkeratotic nor porokeratotic lesions present on today's visit.  Musculoskeletal Examination: Muscle strength 5/5 to all lower extremity muscle groups bilaterally. Adductovarus deformity R 5th toe.  Radiographs: None  Last A1c:       No data to display          Assessment/Plan: 1. Pain due to onychomycosis of toenails of both feet     Consent given for treatment. Patient examined. All patient's and/or POA's questions/concerns addressed  on today's visit.Toenails 1-5 debrided in length and girth without incident. Continue soft, supportive shoe gear daily. Report any pedal injuries to medical professional. Call office if there are any questions/concerns. -Patient/POA to call should there be question/concern in the interim.   Return in about 3 months (around 02/21/2024).  Luella Sager, DPM      [place] LOCATION: [address], [place] [address]                   Office [phone]   [facility] LOCATION: [address] [place], [place] [address] Office [phone]

## 2023-12-14 ENCOUNTER — Encounter: Payer: Self-pay | Admitting: Internal Medicine

## 2024-02-07 ENCOUNTER — Ambulatory Visit (INDEPENDENT_AMBULATORY_CARE_PROVIDER_SITE_OTHER): Admitting: Podiatry

## 2024-02-07 ENCOUNTER — Encounter: Payer: Self-pay | Admitting: Podiatry

## 2024-02-07 DIAGNOSIS — D689 Coagulation defect, unspecified: Secondary | ICD-10-CM

## 2024-02-07 DIAGNOSIS — M79675 Pain in left toe(s): Secondary | ICD-10-CM

## 2024-02-07 DIAGNOSIS — B351 Tinea unguium: Secondary | ICD-10-CM | POA: Diagnosis not present

## 2024-02-07 DIAGNOSIS — M79674 Pain in right toe(s): Secondary | ICD-10-CM

## 2024-02-11 NOTE — Progress Notes (Signed)
  Subjective:  Patient ID: Jasmine Mccormick, female    DOB: March 09, 1934,  MRN: [id number]  Jasmine Mccormick presents to clinic today for: at risk foot care with h/o clotting disorder and painful thick toenails that are difficult to trim. Pain interferes with ambulation. Aggravating factors include wearing enclosed shoe gear. Pain is relieved with periodic professional debridement. No chief complaint on file.   PCP is Plotnikov, Karlynn GAILS, MD. Jasmine Mccormick 10/26/2023.  No Known Allergies  Review of Systems: Negative except as noted in the HPI.  Objective: No changes noted in today's physical examination. There were no vitals filed for this visit.  Jasmine Mccormick is a pleasant 89 y.o. female WD, WN in NAD. AAO x 3.  Vascular Examination: Capillary refill time <3 seconds b/l LE. Palpable pedal pulses b/l LE. Digital hair present b/l. No pedal edema b/l. Skin temperature gradient WNL b/l. Varicosities present b/l.Jasmine Mccormick  Dermatological Examination: Pedal skin with normal turgor, texture and tone b/l. No open wounds. No interdigital macerations b/l. Toenails 1-5 b/l thickened, discolored, dystrophic with subungual debris. There is pain on palpation to dorsal aspect of nailplates. No corns, calluses nor porokeratotic lesions noted..  Neurological Examination: Protective sensation intact with 10 gram monofilament b/l LE.   Musculoskeletal Examination: Muscle strength 5/5 to all lower extremity muscle groups bilaterally. Adductovarus deformity right fifth digit.  Assessment/Plan: 1. Pain due to onychomycosis of toenails of both feet   2. Clotting disorder [facility])   Patient was evaluated and treated. All patient's and/or POA's questions/concerns addressed on today's visit. Mycotic toenails 1-5 debrided in length and girth without incident. Continue soft, supportive shoe gear daily. Report any pedal injuries to medical professional. Call office if there are any quesitons/concerns. -Patient/POA to call  should there be question/concern in the interim.   Return in about 9 weeks (around 04/10/2024).  Jasmine Mccormick, DPM      [place] LOCATION: [address] [place] [address]                   Office [phone]   [facility] LOCATION: [address] [place], [place] [address] Office [phone]

## 2024-02-13 ENCOUNTER — Other Ambulatory Visit (HOSPITAL_BASED_OUTPATIENT_CLINIC_OR_DEPARTMENT_OTHER): Payer: Self-pay | Admitting: Cardiology

## 2024-02-13 DIAGNOSIS — I4819 Other persistent atrial fibrillation: Secondary | ICD-10-CM

## 2024-02-13 NOTE — Telephone Encounter (Signed)
 Prescription refill request for Eliquis  received. Indication: PAF Last office visit: 06/23/23  Jasmine Bruckner MD Scr: 1.12 on 10/26/23  Epic Age: 89 Weight: 61.2kg  Based on above findings Eliquis  5mg  twice daily is the appropriate dose.  Refill approved.

## 2024-03-05 ENCOUNTER — Ambulatory Visit: Admitting: Podiatry

## 2024-04-11 ENCOUNTER — Ambulatory Visit (INDEPENDENT_AMBULATORY_CARE_PROVIDER_SITE_OTHER): Admitting: Podiatry

## 2024-04-11 ENCOUNTER — Ambulatory Visit: Admitting: Podiatry

## 2024-04-11 ENCOUNTER — Encounter: Payer: Self-pay | Admitting: Podiatry

## 2024-04-11 DIAGNOSIS — M79674 Pain in right toe(s): Secondary | ICD-10-CM

## 2024-04-11 DIAGNOSIS — B351 Tinea unguium: Secondary | ICD-10-CM

## 2024-04-11 DIAGNOSIS — M79675 Pain in left toe(s): Secondary | ICD-10-CM

## 2024-04-11 DIAGNOSIS — D689 Coagulation defect, unspecified: Secondary | ICD-10-CM

## 2024-04-11 NOTE — Progress Notes (Signed)
 This patient returns to my office for at risk foot care.  This patient requires this care by a professional since this patient will be at risk due to having coagulation defect due to eliquis .  This patient is unable to cut nails herself since the patient cannot reach her nails.These nails are painful walking and wearing shoes.  This patient presents for at risk foot care today.  General Appearance  Alert, conversant and in no acute stress.  Vascular  Dorsalis pedis and posterior tibial  pulses are  weakly palpable  bilaterally.  Capillary return is within normal limits  bilaterally. Temperature is within normal limits  bilaterally.  Neurologic  Senn-Weinstein monofilament wire test within normal limits  bilaterally. Muscle power within normal limits bilaterally.  Nails Thick disfigured discolored nails with subungual debris  from hallux to fifth toes bilaterally. No evidence of bacterial infection or drainage bilaterally.  Orthopedic  No limitations of motion  feet .  No crepitus or effusions noted.  No bony pathology or digital deformities noted.  Skin  normotropic skin with no porokeratosis noted bilaterally.  No signs of infections or ulcers noted.     Onychomycosis  Pain in right toes  Pain in left toes  Consent was obtained for treatment procedures.   Mechanical debridement of nails 1-5  bilaterally performed with a nail nipper.  Filed with dremel without incident.    Return office visit    9 weeks                  Told patient to return for periodic foot care and evaluation due to potential at risk complications.   Cordella Bold DPM

## 2024-05-08 ENCOUNTER — Ambulatory Visit (INDEPENDENT_AMBULATORY_CARE_PROVIDER_SITE_OTHER): Admitting: Gastroenterology

## 2024-05-08 ENCOUNTER — Encounter: Payer: Self-pay | Admitting: Gastroenterology

## 2024-05-08 VITALS — BP 130/62 | HR 54 | Ht 62.5 in | Wt 123.0 lb

## 2024-05-08 DIAGNOSIS — Z8719 Personal history of other diseases of the digestive system: Secondary | ICD-10-CM | POA: Diagnosis not present

## 2024-05-08 DIAGNOSIS — Z9049 Acquired absence of other specified parts of digestive tract: Secondary | ICD-10-CM

## 2024-05-08 DIAGNOSIS — Z432 Encounter for attention to ileostomy: Secondary | ICD-10-CM | POA: Diagnosis not present

## 2024-05-08 DIAGNOSIS — Z7189 Other specified counseling: Secondary | ICD-10-CM | POA: Diagnosis not present
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
## 2024-05-08 NOTE — Patient Instructions (Signed)
 You will be contacted by a ostomy nurse to discuss an appointment. _______________________________________________________  If your blood pressure at your visit was 140/90 or greater, please contact your primary care physician to follow up on this.  _______________________________________________________  If you are age 88 or older, your body mass index should be between 23-30. Your Body mass index is 22.14 kg/m. If this is out of the aforementioned range listed, please consider follow up with your Primary Care Provider.  If you are age 51 or younger, your body mass index should be between 19-25. Your Body mass index is 22.14 kg/m. If this is out of the aformentioned range listed, please consider follow up with your Primary Care Provider.   ________________________________________________________  The [facility] providers would like to encourage you to use MYCHART to communicate with providers for non-urgent requests or questions.  Due to long hold times on the telephone, sending your provider a message by [facility] may be a faster and more efficient way to get a response.  Please allow 48 business hours for a response.  Please remember that this is for non-urgent requests.  _______________________________________________________  [facility] is using a team-based approach to care.  Your team is made up of your doctor and two to three APPS. Our APPS (Nurse Practitioners and Physician Assistants) work with your physician to ensure care continuity for you. They are fully qualified to address your health concerns and develop a treatment plan. They communicate directly with your gastroenterologist to care for you. Seeing the Advanced Practice Practitioners on your physician's team can help you by facilitating care more promptly, often allowing for earlier appointments, access to diagnostic testing, procedures, and other specialty referrals.   Thank you for trusting me with your  gastrointestinal care. Deanna May, FNP-C

## 2024-05-09 NOTE — Progress Notes (Signed)
 ____________________________________________________________  Attending physician addendum:  Thank you for sending this case to me. I have reviewed the entire note and agree with the plan.  Our surgical colleagues and CCS likely have a close relationship with ostomy nursing and clinic if needed.  Victory Brand, MD  ____________________________________________________________

## 2024-06-06 ENCOUNTER — Encounter: Payer: Self-pay | Admitting: Internal Medicine

## 2024-06-06 DIAGNOSIS — Z23 Encounter for immunization: Secondary | ICD-10-CM | POA: Diagnosis not present

## 2024-06-12 ENCOUNTER — Encounter: Payer: Self-pay | Admitting: Podiatry

## 2024-06-12 ENCOUNTER — Ambulatory Visit (INDEPENDENT_AMBULATORY_CARE_PROVIDER_SITE_OTHER): Admitting: Podiatry

## 2024-06-12 DIAGNOSIS — M79675 Pain in left toe(s): Secondary | ICD-10-CM | POA: Diagnosis not present

## 2024-06-12 DIAGNOSIS — B351 Tinea unguium: Secondary | ICD-10-CM

## 2024-06-12 DIAGNOSIS — M79674 Pain in right toe(s): Secondary | ICD-10-CM | POA: Diagnosis not present

## 2024-06-12 DIAGNOSIS — D689 Coagulation defect, unspecified: Secondary | ICD-10-CM

## 2024-06-12 NOTE — Progress Notes (Signed)
 This patient returns to my office for at risk foot care.  This patient requires this care by a professional since this patient will be at risk due to having coagulation defect due to eliquis .  This patient is unable to cut nails herself since the patient cannot reach her nails.These nails are painful walking and wearing shoes.  This patient presents for at risk foot care today.  General Appearance  Alert, conversant and in no acute stress.  Vascular  Dorsalis pedis and posterior tibial  pulses are  weakly palpable  bilaterally.  Capillary return is within normal limits  bilaterally. Temperature is within normal limits  bilaterally.  Neurologic  Senn-Weinstein monofilament wire test within normal limits  bilaterally. Muscle power within normal limits bilaterally.  Nails Thick disfigured discolored nails with subungual debris  from hallux to fifth toes bilaterally. No evidence of bacterial infection or drainage bilaterally.  Orthopedic  No limitations of motion  feet .  No crepitus or effusions noted.  No bony pathology or digital deformities noted.  Skin  normotropic skin with no porokeratosis noted bilaterally.  No signs of infections or ulcers noted.     Onychomycosis  Pain in right toes  Pain in left toes  Consent was obtained for treatment procedures.   Mechanical debridement of nails 1-5  bilaterally performed with a nail nipper.  Filed with dremel without incident.    Return office visit    10 weeks                  Told patient to return for periodic foot care and evaluation due to potential at risk complications.   Cordella Bold DPM

## 2024-07-29 ENCOUNTER — Other Ambulatory Visit (HOSPITAL_BASED_OUTPATIENT_CLINIC_OR_DEPARTMENT_OTHER): Payer: Self-pay | Admitting: Cardiology

## 2024-07-29 DIAGNOSIS — I480 Paroxysmal atrial fibrillation: Secondary | ICD-10-CM

## 2024-08-14 ENCOUNTER — Ambulatory Visit: Admitting: Podiatry

## 2024-08-14 ENCOUNTER — Other Ambulatory Visit: Payer: Self-pay | Admitting: Internal Medicine

## 2024-08-14 NOTE — Telephone Encounter (Unsigned)
 Copied from CRM #[id number]. Topic: Clinical - Medication Refill >> Aug 14, 2024 10:05 AM Jeshua R wrote: Medication:  levothyroxine  (SYNTHROID ) 88 MCG tablet   Has the patient contacted their pharmacy? Yes, new pharmacy  This is the patient's preferred pharmacy:  [facility] - [place], [place] - [address] [place] [address] Phone: [phone] Fax: [phone] Hours: Not open 24 hours   Is this the correct pharmacy for this prescription? Yes If no, delete pharmacy and type the correct one.   Has the prescription been filled recently? Yes  Is the patient out of the medication? No  Has the patient been seen for an appointment in the last year OR does the patient have an upcoming appointment? Yes  Can we respond through MyChart? No  Agent: Please be advised that Rx refills may take up to 3 business days. We ask that you follow-up with your pharmacy.   ----------------------------------------------------------------------- From previous Reason for Contact - Prescription Issue: Reason for CRM:    ----------------------------------------------------------------------- From previous Reason for Contact - Medication Refill: Medication:   Has the patient contacted their pharmacy?   (Agent: If no, request that the patient contact the pharmacy for the refill. If patient does not wish to contact the pharmacy document the reason why and proceed with request.) (Agent: If yes, when and what did the pharmacy advise?)  This is the patient's preferred pharmacy: No Pharmacies Listed Is this the correct pharmacy for this prescription?   If no, delete pharmacy and type the correct one.   Has the prescription been filled recently?    Is the patient out of the medication?    Has the patient been seen for an appointment in the last year OR does the patient have an upcoming appointment?    Can we respond through MyChart?    Agent: Please be  advised that Rx refills may take up to 3 business days. We ask that you follow-up with your pharmacy.   ----------------------------------------------------------------------- From previous Reason for Contact - Prescription Issue: Reason for CRM:

## 2024-08-16 MED ORDER — LEVOTHYROXINE SODIUM 88 MCG PO TABS: 88.0000 ug | ORAL_TABLET | Freq: Every day | ORAL | 3 refills | Status: AC

## 2024-08-17 ENCOUNTER — Other Ambulatory Visit (HOSPITAL_BASED_OUTPATIENT_CLINIC_OR_DEPARTMENT_OTHER): Payer: Self-pay | Admitting: Cardiology

## 2024-08-17 DIAGNOSIS — I4819 Other persistent atrial fibrillation: Secondary | ICD-10-CM

## 2024-08-19 NOTE — Telephone Encounter (Signed)
 Eliquis  5mg  tablets refused at pharmacy, medication list changed to 2.5mg  tablets.  Awaiting appt with Dr Lonni to notify pt of dosage change and refill to be sent in.

## 2024-08-19 NOTE — Telephone Encounter (Signed)
 Pt last saw Dr Lonni 06/23/23, pt is overdue for follow-up.  Recall in Epic for 6 month f/u was due 5/25.  Msg sent to schedulers to contact pt for f/u appt.  Last labs 10/26/23 Creat 1.12, age 89, weight 55.8kg, based on specified criteria age >32 and weight <60kg, pt should be taking Eliquis  2.5mg  BID for afib.  Please advise if dosage change is appropriate.  Will await f/u appt to refill rx.

## 2024-08-21 ENCOUNTER — Other Ambulatory Visit: Payer: Self-pay

## 2024-08-22 ENCOUNTER — Encounter: Payer: Self-pay | Admitting: Podiatry

## 2024-08-22 ENCOUNTER — Other Ambulatory Visit: Payer: Self-pay

## 2024-08-22 ENCOUNTER — Ambulatory Visit: Admitting: Podiatry

## 2024-08-22 DIAGNOSIS — M79674 Pain in right toe(s): Secondary | ICD-10-CM

## 2024-08-22 DIAGNOSIS — D689 Coagulation defect, unspecified: Secondary | ICD-10-CM

## 2024-08-22 DIAGNOSIS — B351 Tinea unguium: Secondary | ICD-10-CM | POA: Diagnosis not present

## 2024-08-22 DIAGNOSIS — M79675 Pain in left toe(s): Secondary | ICD-10-CM | POA: Diagnosis not present

## 2024-08-22 NOTE — Progress Notes (Signed)
 This patient returns to my office for at risk foot care.  This patient requires this care by a professional since this patient will be at risk due to having coagulation defect due to eliquis .  This patient is unable to cut nails herself since the patient cannot reach her nails.These nails are painful walking and wearing shoes.  This patient presents for at risk foot care today.  General Appearance  Alert, conversant and in no acute stress.  Vascular  Dorsalis pedis and posterior tibial  pulses are  weakly palpable  bilaterally.  Capillary return is within normal limits  bilaterally. Temperature is within normal limits  bilaterally.  Neurologic  Senn-Weinstein monofilament wire test within normal limits  bilaterally. Muscle power within normal limits bilaterally.  Nails Thick disfigured discolored nails with subungual debris  from hallux to fifth toes bilaterally. No evidence of bacterial infection or drainage bilaterally.  Orthopedic  No limitations of motion  feet .  No crepitus or effusions noted.  No bony pathology or digital deformities noted.  Skin  normotropic skin with no porokeratosis noted bilaterally.  No signs of infections or ulcers noted.     Onychomycosis  Pain in right toes  Pain in left toes  Consent was obtained for treatment procedures.   Mechanical debridement of nails 1-5  bilaterally performed with a nail nipper.  Filed with dremel without incident.    Return office visit    10 weeks                  Told patient to return for periodic foot care and evaluation due to potential at risk complications.   Cordella Bold DPM

## 2024-08-26 ENCOUNTER — Other Ambulatory Visit: Payer: Self-pay

## 2024-08-26 ENCOUNTER — Encounter (HOSPITAL_BASED_OUTPATIENT_CLINIC_OR_DEPARTMENT_OTHER): Payer: Self-pay | Admitting: Cardiology

## 2024-08-26 DIAGNOSIS — D6869 Other thrombophilia: Secondary | ICD-10-CM

## 2024-08-26 DIAGNOSIS — I4819 Other persistent atrial fibrillation: Secondary | ICD-10-CM

## 2024-08-26 DIAGNOSIS — I480 Paroxysmal atrial fibrillation: Secondary | ICD-10-CM

## 2024-08-26 MED ORDER — APIXABAN 2.5 MG PO TABS: 2.5000 mg | ORAL_TABLET | Freq: Two times a day (BID) | ORAL | 5 refills | Status: DC

## 2024-08-26 MED ORDER — APIXABAN 2.5 MG PO TABS: 2.5000 mg | ORAL_TABLET | Freq: Two times a day (BID) | ORAL | 1 refills | Status: AC

## 2024-08-26 NOTE — Telephone Encounter (Signed)
 There has been a recommended dose decrease and now a pended order for Eliquis  2.5 mg in a different encounter per anti coag clinic.  Shelah Bernice ORN, RN    08/19/24 11:57 AM Note Eliquis  5mg  tablets refused at pharmacy, medication list changed to 2.5mg  tablets.  Awaiting appt with Dr Lonni to notify pt of dosage change and refill to be sent in.      Pavero, Christopher, RPH to Shelah Bernice ORN, RN      08/19/24 11:50 AM Yes, please reduce to 2.5mg  BID

## 2024-08-27 ENCOUNTER — Telehealth: Payer: Self-pay | Admitting: Internal Medicine

## 2024-08-27 NOTE — Telephone Encounter (Signed)
 Rx refilled by Reche Finder, NP.

## 2024-08-27 NOTE — Telephone Encounter (Signed)
 Patient dropped off document Handicap Placard, to be filled out by provider. Patient requested to send it back via Call Patient to pick up within 7-days. Document is located in providers tray at front office.Please advise at Home: [phone]

## 2024-08-29 ENCOUNTER — Telehealth: Payer: Self-pay

## 2024-08-29 NOTE — Telephone Encounter (Signed)
-----   Message from [facility], NP sent at 08/29/2024  1:35 PM EST ----- Pod B- Patients family requesting home health ostomy nurse. I asked Development worker, international aid at [facility] surgery, our Chief of staff, and several other doctors in office. The only recommendation I got that does not require her going to a clinic is from Dr Federico she mentioned trying: [facility]- can you call them and see if they accept referrals for home health ostomy nurse to help patient with changing and carrying for her ostomy.  Please update the daughter of above.  Deanna, NP

## 2024-09-03 ENCOUNTER — Ambulatory Visit (HOSPITAL_BASED_OUTPATIENT_CLINIC_OR_DEPARTMENT_OTHER): Admitting: Cardiology

## 2024-09-04 NOTE — Telephone Encounter (Signed)
 Spoke with pt niece Barnie in detail about her needs for the pt. [facility] was contacted and spoke with Olam the intake specialist who stated that the would not be able to provided the services that they are requesting.  Barnie was made aware. Barnie stated that she would like to try the original referral that was sent to the [facility]. [facility] was contacted and spoke with [place].  Bari stated that she would reopen the referral and  reach out to the niece.  Barnie  verbalized understanding with all questions answered.

## 2024-09-05 MED ORDER — AMIODARONE HCL 200 MG PO TABS: 200.0000 mg | ORAL_TABLET | Freq: Every day | ORAL | 0 refills | Status: AC

## 2024-09-05 NOTE — Telephone Encounter (Signed)
 Elspeth, RN has already spoken with patient's daughter. Refer to note 08/19/24.

## 2024-09-10 ENCOUNTER — Ambulatory Visit (HOSPITAL_COMMUNITY): Admitting: Nurse Practitioner

## 2024-09-24 ENCOUNTER — Ambulatory Visit (HOSPITAL_BASED_OUTPATIENT_CLINIC_OR_DEPARTMENT_OTHER): Admitting: Family

## 2024-10-01 ENCOUNTER — Ambulatory Visit (HOSPITAL_COMMUNITY): Admitting: Nurse Practitioner

## 2024-10-09 ENCOUNTER — Ambulatory Visit: Payer: Medicare Other

## 2024-11-21 ENCOUNTER — Ambulatory Visit: Admitting: Podiatry
